# Patient Record
Sex: Female | Born: 1940 | Race: White | Hispanic: No | Smoking: Never smoker
Health system: Southern US, Community
[De-identification: ages and names within clinical notes are randomized; demographics above are authoritative.]

## PROBLEM LIST (undated history)

## (undated) DIAGNOSIS — F419 Anxiety disorder, unspecified: Secondary | ICD-10-CM

## (undated) DIAGNOSIS — I2510 Atherosclerotic heart disease of native coronary artery without angina pectoris: Secondary | ICD-10-CM

## (undated) DIAGNOSIS — M1990 Unspecified osteoarthritis, unspecified site: Secondary | ICD-10-CM

## (undated) DIAGNOSIS — N3010 Interstitial cystitis (chronic) without hematuria: Secondary | ICD-10-CM

## (undated) DIAGNOSIS — Z87440 Personal history of urinary (tract) infections: Secondary | ICD-10-CM

## (undated) DIAGNOSIS — F32A Depression, unspecified: Secondary | ICD-10-CM

## (undated) DIAGNOSIS — E785 Hyperlipidemia, unspecified: Secondary | ICD-10-CM

## (undated) DIAGNOSIS — H409 Unspecified glaucoma: Secondary | ICD-10-CM

## (undated) DIAGNOSIS — F329 Major depressive disorder, single episode, unspecified: Secondary | ICD-10-CM

## (undated) DIAGNOSIS — K8020 Calculus of gallbladder without cholecystitis without obstruction: Secondary | ICD-10-CM

## (undated) DIAGNOSIS — R3915 Urgency of urination: Secondary | ICD-10-CM

## (undated) DIAGNOSIS — I10 Essential (primary) hypertension: Secondary | ICD-10-CM

## (undated) DIAGNOSIS — G629 Polyneuropathy, unspecified: Secondary | ICD-10-CM

## (undated) DIAGNOSIS — J189 Pneumonia, unspecified organism: Secondary | ICD-10-CM

## (undated) DIAGNOSIS — K5790 Diverticulosis of intestine, part unspecified, without perforation or abscess without bleeding: Secondary | ICD-10-CM

## (undated) DIAGNOSIS — K219 Gastro-esophageal reflux disease without esophagitis: Secondary | ICD-10-CM

## (undated) HISTORY — DX: Interstitial cystitis (chronic) without hematuria: N30.10

## (undated) HISTORY — DX: Gastro-esophageal reflux disease without esophagitis: K21.9

## (undated) HISTORY — DX: Atherosclerotic heart disease of native coronary artery without angina pectoris: I25.10

## (undated) HISTORY — DX: Anxiety disorder, unspecified: F41.9

## (undated) HISTORY — DX: Hyperlipidemia, unspecified: E78.5

## (undated) HISTORY — DX: Essential (primary) hypertension: I10

## (undated) HISTORY — DX: Depression, unspecified: F32.A

## (undated) HISTORY — PX: BACK SURGERY: SHX140

## (undated) HISTORY — DX: Calculus of gallbladder without cholecystitis without obstruction: K80.20

## (undated) HISTORY — PX: EYE SURGERY: SHX253

## (undated) HISTORY — DX: Unspecified glaucoma: H40.9

## (undated) HISTORY — PX: VAGINAL HYSTERECTOMY: SUR661

## (undated) HISTORY — PX: COLONOSCOPY: SHX174

## (undated) HISTORY — DX: Personal history of urinary (tract) infections: Z87.440

## (undated) HISTORY — DX: Major depressive disorder, single episode, unspecified: F32.9

---

## 1991-11-07 HISTORY — PX: CHOLECYSTECTOMY: SHX55

## 2000-03-13 ENCOUNTER — Encounter: Payer: Self-pay | Admitting: Obstetrics and Gynecology

## 2000-03-13 ENCOUNTER — Encounter: Admission: RE | Admit: 2000-03-13 | Discharge: 2000-03-13 | Payer: Self-pay | Admitting: Obstetrics and Gynecology

## 2000-03-20 ENCOUNTER — Encounter: Payer: Self-pay | Admitting: Obstetrics and Gynecology

## 2000-03-20 ENCOUNTER — Encounter: Admission: RE | Admit: 2000-03-20 | Discharge: 2000-03-20 | Payer: Self-pay | Admitting: Obstetrics and Gynecology

## 2001-07-17 ENCOUNTER — Encounter: Admission: RE | Admit: 2001-07-17 | Discharge: 2001-07-17 | Payer: Self-pay | Admitting: Obstetrics and Gynecology

## 2001-07-17 ENCOUNTER — Encounter: Payer: Self-pay | Admitting: Obstetrics and Gynecology

## 2001-07-23 ENCOUNTER — Other Ambulatory Visit: Admission: RE | Admit: 2001-07-23 | Discharge: 2001-07-23 | Payer: Self-pay | Admitting: Obstetrics and Gynecology

## 2001-07-24 ENCOUNTER — Encounter: Payer: Self-pay | Admitting: Obstetrics and Gynecology

## 2001-07-24 ENCOUNTER — Encounter: Admission: RE | Admit: 2001-07-24 | Discharge: 2001-07-24 | Payer: Self-pay | Admitting: Obstetrics and Gynecology

## 2001-11-06 ENCOUNTER — Encounter (INDEPENDENT_AMBULATORY_CARE_PROVIDER_SITE_OTHER): Payer: Self-pay | Admitting: *Deleted

## 2001-11-06 LAB — CONVERTED CEMR LAB

## 2002-08-21 ENCOUNTER — Encounter: Payer: Self-pay | Admitting: Obstetrics and Gynecology

## 2002-08-21 ENCOUNTER — Encounter: Admission: RE | Admit: 2002-08-21 | Discharge: 2002-08-21 | Payer: Self-pay | Admitting: Obstetrics and Gynecology

## 2002-10-17 ENCOUNTER — Encounter: Admission: RE | Admit: 2002-10-17 | Discharge: 2002-10-17 | Payer: Self-pay | Admitting: Sports Medicine

## 2002-11-21 ENCOUNTER — Encounter: Admission: RE | Admit: 2002-11-21 | Discharge: 2002-11-21 | Payer: Self-pay | Admitting: Family Medicine

## 2003-08-25 ENCOUNTER — Encounter: Admission: RE | Admit: 2003-08-25 | Discharge: 2003-08-25 | Payer: Self-pay | Admitting: Obstetrics and Gynecology

## 2003-08-25 ENCOUNTER — Encounter: Payer: Self-pay | Admitting: Obstetrics and Gynecology

## 2003-09-03 ENCOUNTER — Encounter: Admission: RE | Admit: 2003-09-03 | Discharge: 2003-09-03 | Payer: Self-pay | Admitting: Obstetrics and Gynecology

## 2004-09-05 ENCOUNTER — Other Ambulatory Visit: Admission: RE | Admit: 2004-09-05 | Discharge: 2004-09-05 | Payer: Self-pay | Admitting: Obstetrics and Gynecology

## 2004-09-09 ENCOUNTER — Encounter: Admission: RE | Admit: 2004-09-09 | Discharge: 2004-09-09 | Payer: Self-pay | Admitting: Obstetrics and Gynecology

## 2004-09-28 ENCOUNTER — Other Ambulatory Visit: Admission: RE | Admit: 2004-09-28 | Discharge: 2004-09-28 | Payer: Self-pay | Admitting: Obstetrics and Gynecology

## 2005-10-16 ENCOUNTER — Encounter: Admission: RE | Admit: 2005-10-16 | Discharge: 2005-10-16 | Payer: Self-pay | Admitting: Sports Medicine

## 2005-10-16 ENCOUNTER — Ambulatory Visit: Payer: Self-pay | Admitting: Family Medicine

## 2005-10-16 ENCOUNTER — Encounter: Admission: RE | Admit: 2005-10-16 | Discharge: 2005-10-16 | Payer: Self-pay | Admitting: Obstetrics and Gynecology

## 2005-11-09 ENCOUNTER — Other Ambulatory Visit: Admission: RE | Admit: 2005-11-09 | Discharge: 2005-11-09 | Payer: Self-pay | Admitting: Obstetrics and Gynecology

## 2006-10-18 ENCOUNTER — Encounter: Admission: RE | Admit: 2006-10-18 | Discharge: 2006-10-18 | Payer: Self-pay | Admitting: Obstetrics and Gynecology

## 2006-11-12 ENCOUNTER — Other Ambulatory Visit: Admission: RE | Admit: 2006-11-12 | Discharge: 2006-11-12 | Payer: Self-pay | Admitting: Obstetrics and Gynecology

## 2007-01-03 ENCOUNTER — Ambulatory Visit: Payer: Self-pay | Admitting: Sports Medicine

## 2007-01-04 ENCOUNTER — Encounter (INDEPENDENT_AMBULATORY_CARE_PROVIDER_SITE_OTHER): Payer: Self-pay | Admitting: *Deleted

## 2007-02-04 ENCOUNTER — Telehealth: Payer: Self-pay | Admitting: *Deleted

## 2007-02-06 ENCOUNTER — Ambulatory Visit: Payer: Self-pay | Admitting: Sports Medicine

## 2007-02-06 DIAGNOSIS — M719 Bursopathy, unspecified: Secondary | ICD-10-CM

## 2007-02-06 DIAGNOSIS — M67919 Unspecified disorder of synovium and tendon, unspecified shoulder: Secondary | ICD-10-CM | POA: Insufficient documentation

## 2007-02-06 DIAGNOSIS — M7580 Other shoulder lesions, unspecified shoulder: Secondary | ICD-10-CM

## 2007-02-06 DIAGNOSIS — M25819 Other specified joint disorders, unspecified shoulder: Secondary | ICD-10-CM | POA: Insufficient documentation

## 2007-02-12 ENCOUNTER — Encounter: Payer: Self-pay | Admitting: Sports Medicine

## 2007-03-06 ENCOUNTER — Ambulatory Visit: Payer: Self-pay | Admitting: Sports Medicine

## 2007-03-06 DIAGNOSIS — M25529 Pain in unspecified elbow: Secondary | ICD-10-CM | POA: Insufficient documentation

## 2007-07-22 ENCOUNTER — Telehealth: Payer: Self-pay | Admitting: *Deleted

## 2007-08-01 ENCOUNTER — Ambulatory Visit: Payer: Self-pay | Admitting: Sports Medicine

## 2007-08-01 DIAGNOSIS — F418 Other specified anxiety disorders: Secondary | ICD-10-CM | POA: Insufficient documentation

## 2007-08-01 DIAGNOSIS — R4589 Other symptoms and signs involving emotional state: Secondary | ICD-10-CM | POA: Insufficient documentation

## 2007-08-19 ENCOUNTER — Telehealth: Payer: Self-pay | Admitting: Sports Medicine

## 2007-09-27 ENCOUNTER — Encounter: Payer: Self-pay | Admitting: Sports Medicine

## 2007-11-08 ENCOUNTER — Encounter: Admission: RE | Admit: 2007-11-08 | Discharge: 2007-11-08 | Payer: Self-pay | Admitting: Obstetrics and Gynecology

## 2007-11-14 ENCOUNTER — Other Ambulatory Visit: Admission: RE | Admit: 2007-11-14 | Discharge: 2007-11-14 | Payer: Self-pay | Admitting: Obstetrics and Gynecology

## 2007-12-20 ENCOUNTER — Ambulatory Visit: Payer: Self-pay | Admitting: Sports Medicine

## 2007-12-24 ENCOUNTER — Encounter: Admission: RE | Admit: 2007-12-24 | Discharge: 2007-12-24 | Payer: Self-pay | Admitting: Sports Medicine

## 2008-02-27 ENCOUNTER — Ambulatory Visit: Payer: Self-pay

## 2008-02-27 ENCOUNTER — Encounter: Payer: Self-pay | Admitting: Sports Medicine

## 2008-02-27 DIAGNOSIS — I10 Essential (primary) hypertension: Secondary | ICD-10-CM | POA: Insufficient documentation

## 2008-02-27 DIAGNOSIS — G43909 Migraine, unspecified, not intractable, without status migrainosus: Secondary | ICD-10-CM | POA: Insufficient documentation

## 2008-03-02 ENCOUNTER — Encounter: Payer: Self-pay | Admitting: Family Medicine

## 2008-03-03 ENCOUNTER — Telehealth: Payer: Self-pay | Admitting: *Deleted

## 2008-03-09 LAB — CONVERTED CEMR LAB
ALT: 23 units/L (ref 0–35)
AST: 20 units/L (ref 0–37)
Albumin: 4.6 g/dL (ref 3.5–5.2)
Alkaline Phosphatase: 61 units/L (ref 39–117)
BUN: 20 mg/dL (ref 6–23)
CO2: 21 meq/L (ref 19–32)
Calcium: 9.7 mg/dL (ref 8.4–10.5)
Chloride: 105 meq/L (ref 96–112)
Cholesterol: 238 mg/dL — ABNORMAL HIGH (ref 0–200)
Creatinine, Ser: 0.79 mg/dL (ref 0.40–1.20)
Glucose, Bld: 117 mg/dL — ABNORMAL HIGH (ref 70–99)
HDL: 61 mg/dL (ref >39)
LDL Cholesterol: 130 mg/dL — ABNORMAL HIGH (ref 0–99)
Potassium: 4.5 meq/L (ref 3.5–5.3)
Sodium: 139 meq/L (ref 135–145)
Total Bilirubin: 0.3 mg/dL (ref 0.3–1.2)
Total CHOL/HDL Ratio: 3.9
Total Protein: 7.1 g/dL (ref 6.0–8.3)
Triglycerides: 234 mg/dL — ABNORMAL HIGH (ref <150)
VLDL: 47 mg/dL — ABNORMAL HIGH (ref 0–40)

## 2008-05-04 ENCOUNTER — Encounter (INDEPENDENT_AMBULATORY_CARE_PROVIDER_SITE_OTHER): Payer: Self-pay | Admitting: *Deleted

## 2008-06-01 ENCOUNTER — Telehealth: Payer: Self-pay | Admitting: *Deleted

## 2008-06-02 ENCOUNTER — Ambulatory Visit: Payer: Self-pay | Admitting: Sports Medicine

## 2008-06-02 LAB — CONVERTED CEMR LAB: Direct LDL: 118 mg/dL — ABNORMAL HIGH

## 2008-06-08 ENCOUNTER — Telehealth: Payer: Self-pay | Admitting: *Deleted

## 2008-11-09 ENCOUNTER — Encounter: Admission: RE | Admit: 2008-11-09 | Discharge: 2008-11-09 | Payer: Self-pay | Admitting: Obstetrics and Gynecology

## 2008-12-04 ENCOUNTER — Other Ambulatory Visit: Admission: RE | Admit: 2008-12-04 | Discharge: 2008-12-04 | Payer: Self-pay | Admitting: Obstetrics and Gynecology

## 2009-03-10 ENCOUNTER — Encounter: Payer: Self-pay | Admitting: Family Medicine

## 2009-03-10 ENCOUNTER — Ambulatory Visit: Payer: Self-pay | Admitting: Family Medicine

## 2009-03-10 LAB — CONVERTED CEMR LAB
BUN: 18 mg/dL (ref 6–23)
CO2: 25 meq/L (ref 19–32)
Calcium: 9.6 mg/dL (ref 8.4–10.5)
Chloride: 102 meq/L (ref 96–112)
Cholesterol: 185 mg/dL (ref 0–200)
Creatinine, Ser: 0.84 mg/dL (ref 0.40–1.20)
Glucose, Bld: 105 mg/dL — ABNORMAL HIGH (ref 70–99)
HDL: 53 mg/dL (ref >39)
LDL Cholesterol: 114 mg/dL — ABNORMAL HIGH (ref 0–99)
Potassium: 4 meq/L (ref 3.5–5.3)
Sodium: 139 meq/L (ref 135–145)
Total CHOL/HDL Ratio: 3.5
Triglycerides: 90 mg/dL (ref <150)
VLDL: 18 mg/dL (ref 0–40)

## 2009-03-25 ENCOUNTER — Ambulatory Visit: Payer: Self-pay | Admitting: Family Medicine

## 2009-03-25 DIAGNOSIS — M25569 Pain in unspecified knee: Secondary | ICD-10-CM | POA: Insufficient documentation

## 2009-03-25 DIAGNOSIS — M19049 Primary osteoarthritis, unspecified hand: Secondary | ICD-10-CM | POA: Insufficient documentation

## 2009-03-29 ENCOUNTER — Ambulatory Visit: Payer: Self-pay | Admitting: Family Medicine

## 2009-09-15 ENCOUNTER — Encounter: Payer: Self-pay | Admitting: Family Medicine

## 2009-09-20 ENCOUNTER — Encounter: Payer: Self-pay | Admitting: Family Medicine

## 2009-11-10 ENCOUNTER — Encounter: Admission: RE | Admit: 2009-11-10 | Discharge: 2009-11-10 | Payer: Self-pay | Admitting: Obstetrics and Gynecology

## 2009-12-06 ENCOUNTER — Other Ambulatory Visit: Admission: RE | Admit: 2009-12-06 | Discharge: 2009-12-06 | Payer: Self-pay | Admitting: Obstetrics and Gynecology

## 2009-12-06 ENCOUNTER — Encounter: Payer: Self-pay | Admitting: Family Medicine

## 2009-12-06 LAB — CONVERTED CEMR LAB: Pap Smear: NEGATIVE

## 2010-03-28 ENCOUNTER — Ambulatory Visit: Payer: Self-pay | Admitting: Family Medicine

## 2010-03-30 ENCOUNTER — Ambulatory Visit: Payer: Self-pay | Admitting: Family Medicine

## 2010-03-30 ENCOUNTER — Encounter: Payer: Self-pay | Admitting: Family Medicine

## 2010-04-01 ENCOUNTER — Encounter: Payer: Self-pay | Admitting: Family Medicine

## 2010-04-01 ENCOUNTER — Ambulatory Visit: Payer: Self-pay | Admitting: Family Medicine

## 2010-04-01 ENCOUNTER — Telehealth: Payer: Self-pay | Admitting: *Deleted

## 2010-04-05 ENCOUNTER — Encounter: Payer: Self-pay | Admitting: Family Medicine

## 2010-04-05 LAB — CONVERTED CEMR LAB
ALT: 30 units/L (ref 0–35)
AST: 29 units/L (ref 0–37)
Albumin: 4.2 g/dL (ref 3.5–5.2)
Alkaline Phosphatase: 62 units/L (ref 39–117)
BUN: 19 mg/dL (ref 6–23)
Bilirubin, Direct: 0.1 mg/dL (ref 0.0–0.3)
CO2: 26 meq/L (ref 19–32)
Calcium: 9.2 mg/dL (ref 8.4–10.5)
Chloride: 99 meq/L (ref 96–112)
Cholesterol: 171 mg/dL (ref 0–200)
Creatinine, Ser: 0.78 mg/dL (ref 0.40–1.20)
Glucose, Bld: 107 mg/dL — ABNORMAL HIGH (ref 70–99)
HDL: 49 mg/dL (ref >39)
Indirect Bilirubin: 0.4 mg/dL (ref 0.0–0.9)
LDL Cholesterol: 96 mg/dL (ref 0–99)
Potassium: 4 meq/L (ref 3.5–5.3)
Sodium: 137 meq/L (ref 135–145)
Total Bilirubin: 0.5 mg/dL (ref 0.3–1.2)
Total CHOL/HDL Ratio: 3.5
Total Protein: 6.5 g/dL (ref 6.0–8.3)
Triglycerides: 131 mg/dL (ref <150)
VLDL: 26 mg/dL (ref 0–40)

## 2010-04-06 ENCOUNTER — Telehealth: Payer: Self-pay | Admitting: Family Medicine

## 2010-04-14 ENCOUNTER — Encounter: Payer: Self-pay | Admitting: Family Medicine

## 2010-05-04 ENCOUNTER — Encounter: Payer: Self-pay | Admitting: Family Medicine

## 2010-07-01 ENCOUNTER — Encounter: Payer: Self-pay | Admitting: Family Medicine

## 2010-08-08 ENCOUNTER — Encounter: Payer: Self-pay | Admitting: *Deleted

## 2010-11-14 ENCOUNTER — Encounter
Admission: RE | Admit: 2010-11-14 | Discharge: 2010-11-14 | Payer: Self-pay | Source: Home / Self Care | Attending: Family Medicine | Admitting: Family Medicine

## 2010-11-16 ENCOUNTER — Encounter: Payer: Self-pay | Admitting: Family Medicine

## 2010-11-23 ENCOUNTER — Ambulatory Visit
Admission: RE | Admit: 2010-11-23 | Discharge: 2010-11-23 | Payer: Self-pay | Source: Home / Self Care | Attending: Family Medicine | Admitting: Family Medicine

## 2010-11-23 LAB — CONVERTED CEMR LAB
Blood in Urine, dipstick: NEGATIVE
Nitrite: NEGATIVE
Protein, U semiquant: NEGATIVE
Specific Gravity, Urine: 1.02
Urobilinogen, UA: 0.2
pH: 5.5

## 2010-11-24 ENCOUNTER — Encounter: Payer: Self-pay | Admitting: Family Medicine

## 2010-11-26 ENCOUNTER — Encounter: Payer: Self-pay | Admitting: Obstetrics and Gynecology

## 2010-11-27 ENCOUNTER — Encounter: Payer: Self-pay | Admitting: Obstetrics and Gynecology

## 2010-12-06 NOTE — Progress Notes (Signed)
Summary: phn msg  Phone Note Call from Patient Call back at Home Phone [phone]   Caller: Patient Summary of Call: pt states that the Fiorcet has helped - took a while but is now is better Initial call taken by: De Nurse,  April 06, 2010 1:43 PM

## 2010-12-06 NOTE — Miscellaneous (Signed)
Summary: records reviewed and info entered  Clinical Lists Changes   Mammogram  Procedure date:  11/10/2009  Findings:      Assessment: BIRADS 1. Location: Breast Center [facility].   Scattered fibroglandular densities.    Pap Smear  Procedure date:  12/06/2009  Findings:      Interpretation/Result:Negative for intraepithelial Lesion or Malignancy.   Ancillary Testing: NO HR HPV present. Done at [facility].  Pt s/p hysterectomy, pt desired pap.    Bone Density  Procedure date:  01/04/2009  Findings:         Hip Total: T Score -2.5 to -1.0 Hip.   Radius -0.9  Unable to eval lumbar spine due to degenerative changes. Stable osteopenia.  Pelvic US  Procedure date:  01/04/2009  Findings:      normal: s/p hysterectomy.  Ovaries present and norma.    Mammogram  Procedure date:  11/09/2008  Findings:      Assessment: BIRADS 1. Scattered fibroglandular densities  Mammogram  Procedure date:  11/08/2007  Findings:      Assessment: BIRADS 1.  Tissue heterogeneously dense.     Observations: Added new observation of PAST SURG HX: Cholecystectomy 1993  Hysterectomy 1978 cataracts removed 9/10 (04/14/2010 8:51) Added new observation of DM PROGRESS: N/A (04/14/2010 8:51) Added new observation of DM FSREVIEW: N/A (04/14/2010 8:51) Added new observation of PAP SMEAR: Interpretation/Result:Negative for intraepithelial Lesion or Malignancy.   Ancillary Testing: NO HR HPV present. Done at [facility].  Pt s/p hysterectomy, pt desired pap.   (12/06/2009 8:55) Added new observation of MAMMOGRAM: Assessment: BIRADS 1. Location: Breast Center [facility].   Scattered fibroglandular densities.   (11/10/2009 8:53) Added new observation of PELVIS US: normal: s/p hysterectomy.  Ovaries present and norma.   (01/04/2009 9:13) Added new observation of BONE DENSITY:    Hip Total: T Score -2.5 to -1.0 Hip.   Radius -0.9  Unable to eval lumbar spine due to  degenerative changes. Stable osteopenia. (01/04/2009 9:05) Added new observation of MAMMOGRAM: Assessment: BIRADS 1. Scattered fibroglandular densities (11/09/2008 9:14) Added new observation of MAMMOGRAM: Assessment: BIRADS 1.  Tissue heterogeneously dense.    (11/08/2007 9:15)     Past History:  Past Surgical History: Cholecystectomy 1993  Hysterectomy 1978 cataracts removed 9/10    Prevention & Chronic Care Immunizations   Influenza vaccine: Not documented   Influenza vaccine deferral: Not indicated  (03/28/2010)    Tetanus booster: 11/06/1996: Done.   Tetanus booster due: 11/06/2006    Pneumococcal vaccine: given  (11/06/2002)   Pneumococcal vaccine due: None    H. zoster vaccine: Not documented  Colorectal Screening   Hemoccult: Done.  (11/06/2001)   Hemoccult due: Not Indicated    Colonoscopy: normal  (11/07/2003)   Colonoscopy due: 11/06/2013  Other Screening   Pap smear: Interpretation/Result:Negative for intraepithelial Lesion or Malignancy.   Ancillary Testing: NO HR HPV present. Done at [facility].  Pt s/p hysterectomy, pt desired pap.    (12/06/2009)   Pap smear due: Not Indicated    Mammogram: Assessment: BIRADS 1. Location: Breast Center [facility].   Scattered fibroglandular densities.    (11/10/2009)   Mammogram due: 11/06/2009    DXA bone density scan:   Hip Total: T Score -2.5 to -1.0 Hip.   Radius -0.9  Unable to eval lumbar spine due to degenerative changes. Stable osteopenia.  (01/04/2009)   Smoking status: never  (03/28/2010)  Lipids   Total Cholesterol: 171  (03/30/2010)   Lipid panel action/deferral: Lipid  Panel ordered   LDL: 96  (03/30/2010)   LDL Direct: 118  (06/02/2008)   HDL: 49  (03/30/2010)   Triglycerides: 131  (03/30/2010)    SGOT (AST): 29  (03/30/2010)   BMP action: Ordered   SGPT (ALT): 30  (03/30/2010)   Alkaline phosphatase: 62  (03/30/2010)   Total bilirubin: 0.5  (03/30/2010)  Hypertension    Last Blood Pressure: 130 / 74  (04/01/2010)   Serum creatinine: 0.78  (03/30/2010)   BMP action: Ordered   Serum potassium 4.0  (03/30/2010)  Self-Management Support :   Personal Goals (by the next clinic visit) :      Personal blood pressure goal: 140/90  (03/28/2010)     Personal LDL goal: 100  (03/28/2010)    Hypertension self-management support: Not documented    Hypertension self-management support not done because: Good outcomes  (03/28/2010)    Lipid self-management support: Not documented     Lipid self-management support not done because: Good outcomes  (03/28/2010)

## 2010-12-06 NOTE — Miscellaneous (Signed)
Summary: ROI  ROI   Imported By: De Nurse 04/06/2010 16:09:53  _____________________________________________________________________  External Attachment:    Type:   Image     Comment:   External Document

## 2010-12-06 NOTE — Consult Note (Signed)
Summary: [facility]  [facility]   Imported By: Marily Memos 10/06/2010 10:19:08  _____________________________________________________________________  External Attachment:    Type:   Image     Comment:   External Document

## 2010-12-06 NOTE — Consult Note (Signed)
Summary: [facility]   Imported By: De Nurse 05/17/2010 16:17:20  _____________________________________________________________________  External Attachment:    Type:   Image     Comment:   External Document

## 2010-12-06 NOTE — Assessment & Plan Note (Signed)
Summary: cpe,df   Vital Signs:  Patient profile:   70 year old female Height:      64 inches Weight:      152.4 pounds BMI:     26.25 Temp:     98.4 degrees F oral Pulse rate:   102 / minute Pulse rhythm:   regular BP sitting:   135 / 84  (left arm) Cuff size:   regular  Vitals Entered By: Loralee Pacas CMA (Mar 28, 2010 8:47 AM) CC: cpe Is Patient Diabetic? No   CC:  cpe.  History of Present Illness: 70 yo female here for CPE.   c/o HA that woke her up about 12 days ago.  Has taken Aleve with some relief, but not complete resolution.  + Significant stress in life - 1 daughter making some unwise choices, feels this is affecting her granddaughters.  Has tried to get CPS involved but they have felt the children are safe.  No photophonia, photophobia, nausea, vomiting.  Pain occurs R side of head at base of skull, radiates to top of head.  Relieved with massage, sleeping with fist under neck.  Denies numbness/weakness/ speech problems.    Also dx with glaucoma this year.  Treated with drops.  Also notes pointer finger R hand now deviating slightly.  Does not bother her.      Habits & Providers  Alcohol-Tobacco-Diet     Alcohol drinks/day: 2     Alcohol Counseling: not indicated; use of alcohol is not excessive or problematic     Alcohol type: wine     Feels need to cut down: no     Feels annoyed by complaints: no     Needs 'eye opener' in am: no     Tobacco Status: never  Exercise-Depression-Behavior     Does Patient Exercise: yes     Exercise Counseling: not indicated; exercise is adequate     Type of exercise: walking, yoga     STD Risk: never  Current Medications (verified): 1)  Lisinopril 20 Mg Tabs (Lisinopril) .... Take 1 Tablet By Mouth 2)  Claritin 10 Mg  Caps (Loratadine) .Marland Kitchen.. 1 By Mouth Qd 3)  Hydrochlorothiazide 12.5 Mg  Tabs (Hydrochlorothiazide) .Marland Kitchen.. 1 By Mouth Qd 4)  Lipitor 40 Mg  Tabs (Atorvastatin Calcium) .... Take One Tablet At Bed Time 5)   Amitriptyline Hcl 50 Mg Tabs (Amitriptyline Hcl) .... Take 1 By Mouth At Night As Needed For Insomnia 6)  Alprazolam 0.25 Mg Tbdp (Alprazolam) .... Take 1 Every 6 Hours As Needed For Severe Anxiety 7)  Aleve 220 Mg Tabs (Naproxen Sodium) .... 2 By Mouth As Needed Pain 8)  Travatan Z 0.004 % Soln (Travoprost) .Marland Kitchen.. 1 Drop Ou Phs  Allergies (verified): No Known Drug Allergies  Past History:  Past Medical History: Psoriasis - nizoral shampoo and dermasmooth on scalp  - she has had light treatmetns 3 years with UVL patellofemoral pain rupturued diverticulae - hosp x 5 days migraines - uses fiorinal R shoulder subdeltoid bursitis - responded very well to injection Hypertension Hyperlipidemia glaucoma dx 2010  Past Surgical History: Cholecystectomy  Hysterectomy  cataracts removed Aug 14, 2023  Family History: Father died at 72y with liver CA,  Mother died at 68y with CAD  Sister - 53y, healthy  Social History: Walks up to 4-5 miles/day up to 5 days/week.   Yoga.   Lives alone, has significant other, not sexually active  3 daughter Synetta Fail [place], [place], [place]).   Pt is retired Teacher, early years/pre.  Enjoys reading, traveling. Lots of stress from daughter.  Feels her granddaughters are endangered by her unwise choices and has tried to get CPS involved.  Husband committed suicide at age 60  Review of Systems       Vision change with cataract surgery for close vision  Physical Exam  General:  Well-developed,well-nourished,in no acute distress; alert,appropriate and cooperative throughout examination Eyes:  No corneal or conjunctival inflammation noted. EOMI. Perrla.  Vision grossly normal. Ears:  External ear exam shows no significant lesions or deformities.  Otoscopic examination reveals clear canals, tympanic membranes are intact bilaterally without bulging, retraction, inflammation or discharge. Hearing is grossly normal bilaterally. Mouth:  Oral mucosa and oropharynx without lesions or  exudates.  Teeth in good repair. Lungs:  Normal respiratory effort, chest expands symmetrically. Lungs are clear to auscultation, no crackles or wheezes. Heart:  Normal rate and regular rhythm. S1 and S2 normal without gallop, murmur, click, rub or other extra sounds. Extremities:  R pointer finger with swelling of PIP, slight ulnar deviation of that one finger. Neurologic:  CN II - XII intact except VF not assessed.  Strength 5/5, DTRs 2+ trouroughout, rapid alt movements and F-N intact.  Nl heel/toe/tandem gait.  Neg Romberg.  No pronator drift.   Psych:  Nl grooming and dress.  Tearful when describing her daughter.  non labile.  No FOI/LOA.  Nl TC/TP   Impression & Recommendations:  Problem # 1:  PREVENTIVE HEALTH CARE (ICD-V70.0)  Had gyn exam and mammo done in Jan/Feb.  Will request records.  Otherwise, UTD  Orders: [facility] - Est  65+ [phone])  Problem # 2:  HYPERTENSION (ICD-401.9) At goal.  Continue current regimen Her updated medication list for this problem includes:    Lisinopril 20 Mg Tabs (Lisinopril) .Marland Kitchen... Take 1 tablet by mouth    Hydrochlorothiazide 12.5 Mg Tabs (Hydrochlorothiazide) .Marland Kitchen... 1 by mouth qd  Orders: T-Basic Metabolic Panel [phone]) [facility] - Est  65+ [phone])  Problem # 3:  HYPERCHOLESTEROLEMIA (ICD-272.0) Check panel when fasting. Check CMP. Her updated medication list for this problem includes:    Lipitor 40 Mg Tabs (Atorvastatin calcium) .Marland Kitchen... Take one tablet at bed time  Orders: T-Lipid Profile [phone]) [facility] - Est  65+ [phone])  Problem # 4:  HEADACHE (ICD-784.0)  Likely tension.  Trial of cyclobenzaprine. Pt advised of interaction with additive effects of ETOH, amitryptiline.  F/u if no improvement. The following medications were removed from the medication list:    Fioricet 50-325-40 Mg Tabs (Butalbital-apap-caffeine) .Marland Kitchen... 2 at onset of headache and repeat in 6 hours Her updated medication list for this problem includes:    Aleve 220 Mg Tabs  (Naproxen sodium) .Marland Kitchen... 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  No intervention needed  Orders: [facility] - Est  65+ [phone])  Problem # 6:  FAMILY STRESS (ICD-V61.9)  Pt with sig problems with daughter.  She does have support from other daughters, from faith community and feels that her massage therapist also helps her with her problems.  Pt feels she is doing ok with this.  Orders: [facility] - Est  65+ [phone])  Complete Medication List: 1)  Lisinopril 20 Mg Tabs (Lisinopril) .... Take 1 tablet by mouth 2)  Claritin 10 Mg Caps (Loratadine) .Marland Kitchen.. 1 by mouth qd 3)  Hydrochlorothiazide 12.5 Mg Tabs (  Hydrochlorothiazide) .Marland Kitchen.. 1 by mouth qd 4)  Lipitor 40 Mg Tabs (Atorvastatin calcium) .... Take one tablet at bed time 5)  Amitriptyline Hcl 50 Mg Tabs (Amitriptyline hcl) .... Take 1 by mouth at night as needed for insomnia 6)  Alprazolam 0.25 Mg Tbdp (Alprazolam) .... Take 1 every 6 hours as needed for severe anxiety 7)  Aleve 220 Mg Tabs (Naproxen sodium) .... 2 by mouth as needed pain 8)  Travatan Z 0.004 % Soln (Travoprost) .Marland Kitchen.. 1 drop ou phs 9)  Cyclobenzaprine Hcl 5 Mg Tabs (Cyclobenzaprine hcl) .... Take 1-2 by mouth three times a day as needed for muscle spasm.  will make you sleepy  Other Orders: T-Hepatic Function [phone])  Patient Instructions: 1)  Please make an appt in the next few weeks with the lab. 2)  We will look for the information from Dr. Thomasena Edis. 3)  Please let us know if the medicine does not help your headaches, or if you are feeling worse. Prescriptions: CYCLOBENZAPRINE HCL 5 MG TABS (CYCLOBENZAPRINE HCL) Take 1-2 by mouth three times a day as needed for muscle spasm.  Will make you sleepy  #60 x 3   Entered and Authorized by:   Terrion Gencarelli [place] MD   Signed by:   Rachella Basden [place] MD on 03/28/2010    Method used:   Electronically to        [facility]. [phone]* (retail)       [phone]       [place], [place]  [address]       Ph: [phone]       Fax: [phone]   RxID:   [id number]    Prevention & Chronic Care Immunizations   Influenza vaccine: Not documented   Influenza vaccine deferral: Not indicated  (03/28/2010)    Tetanus booster: 11/06/1996: Done.   Tetanus booster due: 11/06/2006    Pneumococcal vaccine: given  (11/06/2002)   Pneumococcal vaccine due: None    H. zoster vaccine: Not documented  Colorectal Screening   Hemoccult: Done.  (11/06/2001)   Hemoccult due: Not Indicated    Colonoscopy: normal  (11/07/2003)   Colonoscopy due: 11/06/2013  Other Screening   Pap smear: Done.  (11/06/2001)   Pap smear due: Not Indicated    Mammogram: normal  (11/06/2008)   Mammogram due: 11/06/2009    DXA bone density scan: Not documented   Smoking status: never  (03/28/2010)  Lipids   Total Cholesterol: 185  (03/10/2009)   Lipid panel action/deferral: Lipid Panel ordered   LDL: 114  (03/10/2009)   LDL Direct: 118  (06/02/2008)   HDL: 53  (03/10/2009)   Triglycerides: 90  (03/10/2009)    SGOT (AST): 20  (02/27/2008)   BMP action: Ordered   SGPT (ALT): 23  (02/27/2008)   Alkaline phosphatase: 61  (02/27/2008)   Total bilirubin: 0.3  (02/27/2008)    Lipid flowsheet reviewed?: Yes   Progress toward LDL goal: At goal  Hypertension   Last Blood Pressure: 135 / 84  (03/28/2010)   Serum creatinine: 0.84  (03/10/2009)   BMP action: Ordered   Serum potassium 4.0  (03/10/2009)    Hypertension flowsheet reviewed?: Yes   Progress toward BP goal: At goal  Self-Management Support :   Personal Goals (by the next clinic visit) :      Personal blood pressure goal: 140/90  (03/28/2010)     Personal LDL goal: 100  (03/28/2010)    Hypertension  self-management support: Not documented    Hypertension self-management  support not done because: Good outcomes  (03/28/2010)    Lipid self-management support: Not documented     Lipid self-management support not done because: Good outcomes  (03/28/2010)

## 2010-12-06 NOTE — Letter (Signed)
Summary: Generic Letter  [facility]  [address]   [place], [place] [address]   Phone: [phone]  Fax: [phone]    04/05/2010  Melissa Gonzales [address] [place], [place]  [address]  Dear [name], I have the results from your lab work, and everything looks fine. Your glucose is 107, which is a little higher than we like, but definitely not in the diabetic range.  I would suggest that you try to limit your intake of sugar and starchy foods, and we can continue to follow this.  Everything else looked fine - kidney function, liver function and cholesterol.  Please let us know if there is anything we can do for you.     Sincerely,   Daysi Boggan [place] MD  Appended Document: Generic Letter mailed.  Appended Document: Generic Letter mailed.

## 2010-12-06 NOTE — Progress Notes (Signed)
Summary: triage  Phone Note Call from Patient Call back at Home Phone [phone]   Caller: Patient Summary of Call: Seen Monday for pain in head on right side and it is no better, but no worse. Should she be seen again. Initial call taken by: Clydell Hakim,  Apr 01, 2010 9:25 AM  Follow-up for Phone Call        states the aleve helps but the HA comes back as it wears off. she is very concerned. wants to be seen. appt with Dr. Leveda Anna as pcp is not here today Follow-up by: Golden Circle RN,  Apr 01, 2010 9:26 AM

## 2010-12-06 NOTE — Assessment & Plan Note (Signed)
Summary: ha continues/[place]   Vital Signs:  Patient profile:   70 year old female Weight:      153 pounds Temp:     98.3 degrees F Pulse rate:   98 / minute BP sitting:   130 / 74  CC:  headache.  History of Present Illness: C/O continued daily headache.  See note of 5/23.  No visual changes.  Very mild photophobia and phonophbia.  Denies nausea or other GI symptoms No real benefit from flexeril.  Has taken fioricet in the past with severe muscle contraction HA.   High stress home with daughter a focus of stress. No syncope, confusion or focal neuro symptoms  Headache does go away at times (mornings) and typically returns each afternoon.  Has not been taking any alprazolam.  Current Medications (verified): 1)  Lisinopril 20 Mg Tabs (Lisinopril) .... Take 1 Tablet By Mouth 2)  Claritin 10 Mg  Caps (Loratadine) .Marland Kitchen.. 1 By Mouth Qd 3)  Hydrochlorothiazide 12.5 Mg  Tabs (Hydrochlorothiazide) .Marland Kitchen.. 1 By Mouth Qd 4)  Lipitor 40 Mg  Tabs (Atorvastatin Calcium) .... Take One Tablet At Bed Time 5)  Amitriptyline Hcl 50 Mg Tabs (Amitriptyline Hcl) .... Take 1 By Mouth At Night As Needed For Insomnia 6)  Alprazolam 0.25 Mg Tbdp (Alprazolam) .... Take 1 Every 6 Hours As Needed For Severe Anxiety 7)  Aleve 220 Mg Tabs (Naproxen Sodium) .... 2 By Mouth As Needed Pain 8)  Travatan Z 0.004 % Soln (Travoprost) .Marland Kitchen.. 1 Drop Ou Phs 9)  Cyclobenzaprine Hcl 5 Mg Tabs (Cyclobenzaprine Hcl) .... Take 1-2 By Mouth Three Times A Day As Needed For Muscle Spasm.  Will Make You Sleepy 10)  Fioricet 50-325-40 Mg Tabs (Butalbital-Apap-Caffeine) .... One or Two By Mouth Q4h As Needed Headache.  Max 8 Tabs Per Day  Allergies (verified): No Known Drug Allergies  Past History:  Past medical, surgical, family and social histories (including risk factors) reviewed, and no changes noted (except as noted below).  Past Medical History: Reviewed history from 03/28/2010 and no changes required. Psoriasis - nizoral  shampoo and dermasmooth on scalp  - she has had light treatmetns 3 years with UVL patellofemoral pain rupturued diverticulae - hosp x 5 days migraines - uses fiorinal R shoulder subdeltoid bursitis - responded very well to injection Hypertension Hyperlipidemia glaucoma dx 2010  Past Surgical History: Reviewed history from 03/28/2010 and no changes required. Cholecystectomy  Hysterectomy  cataracts removed 9/10  Family History: Reviewed history from 03/28/2010 and no changes required. Father died at 32y with liver CA,  Mother died at 78y with CAD  Sister - 79y, healthy  Social History: Reviewed history from 03/28/2010 and no changes required. Walks up to 4-5 miles/day up to 5 days/week.   Yoga.   Lives alone, has significant other, not sexually active  3 daughter Synetta Fail [place], [place], [place]).   Pt is retired Teacher, early years/pre.  Enjoys reading, traveling. Lots of stress from daughter.  Feels her granddaughters are endangered by her unwise choices and has tried to get CPS involved.  Husband committed suicide at age 33  Physical Exam  General:  Well-developed,well-nourished,in no acute distress; alert,appropriate and cooperative throughout examination Head:  tender over occipital ridge.  Mild tenderness over neck extensors.  No massiter or temporalis tenderness Eyes:  No corneal or conjunctival inflammation noted. EOMI. Perrla. Funduscopic exam benign, without hemorrhages, exudates or papilledema. Vision grossly normal. Neurologic:  No cranial nerve deficits noted. Station and gait are normal. Plantar reflexes are down-going  bilaterally. DTRs are symmetrical throughout. Sensory, motor and coordinative functions appear intact. Psych:  On the verge of tears when speaking about her daughter   Impression & Recommendations:  Problem # 1:  HEADACHE (ICD-784.0)  Muscle contraction in bad cycle exacerbated by stress.  Given fioricet and pain med strategies.  Also, encouraged use of  alprazolam which is also a muscle relaxant.  Consider Physical therapy if these interventions do not work. Her updated medication list for this problem includes:    Aleve 220 Mg Tabs (Naproxen sodium) .Marland Kitchen... 2 by mouth as needed pain    Fioricet 50-325-40 Mg Tabs (Butalbital-apap-caffeine) ..... One or two by mouth q4h as needed headache.  max 8 tabs per day  Orders: [facility]- Est Level  3 ([id number])  Complete Medication List: 1)  Lisinopril 20 Mg Tabs (Lisinopril) .... Take 1 tablet by mouth 2)  Claritin 10 Mg Caps (Loratadine) .Marland Kitchen.. 1 by mouth qd 3)  Hydrochlorothiazide 12.5 Mg Tabs (Hydrochlorothiazide) .Marland Kitchen.. 1 by mouth qd 4)  Lipitor 40 Mg Tabs (Atorvastatin calcium) .... Take one tablet at bed time 5)  Amitriptyline Hcl 50 Mg Tabs (Amitriptyline hcl) .... Take 1 by mouth at night as needed for insomnia 6)  Alprazolam 0.25 Mg Tbdp (Alprazolam) .... Take 1 every 6 hours as needed for severe anxiety 7)  Aleve 220 Mg Tabs (Naproxen sodium) .... 2 by mouth as needed pain 8)  Travatan Z 0.004 % Soln (Travoprost) .Marland Kitchen.. 1 drop ou phs 9)  Cyclobenzaprine Hcl 5 Mg Tabs (Cyclobenzaprine hcl) .... Take 1-2 by mouth three times a day as needed for muscle spasm.  will make you sleepy 10)  Fioricet 50-325-40 Mg Tabs (Butalbital-apap-caffeine) .... One or two by mouth q4h as needed headache.  max 8 tabs per day Prescriptions: FIORICET 50-325-40 MG TABS (BUTALBITAL-APAP-CAFFEINE) one or two by mouth q4h as needed headache.  Max 8 tabs per day  #60 x 2   Entered and Authorized by:   Doralee Albino MD   Signed by:   Doralee Albino MD on 04/01/2010   Method used:   Handwritten   RxID:   [id number]

## 2010-12-06 NOTE — Miscellaneous (Signed)
Summary: received flu vaccine  Clinical Lists Changes  received notification from  [facility] [address] that patient received flu vaccine on 07/30/2010. Theresia Lo RN  August 08, 2010 10:49 AM  Observations: Added new observation of FLU VAX: Historical (07/30/2010 10:50)      Influenza Immunization History:    Influenza # 1:  Historical (07/30/2010)

## 2010-12-08 NOTE — Miscellaneous (Signed)
Summary: negative mammogram report entered  Clinical Lists Changes  Observations: Added new observation of DM PROGRESS: N/A (11/16/2010 8:49) Added new observation of DM FSREVIEW: N/A (11/16/2010 8:49) Added new observation of MAMMOGRAM: No specific mammographic evidence of malignancy.  Assessment: BIRADS 1.  (11/14/2010 8:50)      Prevention & Chronic Care Immunizations   Influenza vaccine: Historical  (07/30/2010)   Influenza vaccine deferral: Not indicated  (03/28/2010)    Tetanus booster: 11/06/1996: Done.   Tetanus booster due: 11/06/2006    Pneumococcal vaccine: given  (11/06/2002)   Pneumococcal vaccine due: None    H. zoster vaccine: Not documented  Colorectal Screening   Hemoccult: Done.  (11/06/2001)   Hemoccult due: Not Indicated    Colonoscopy: normal  (11/07/2003)   Colonoscopy due: 11/06/2013  Other Screening   Pap smear: Interpretation/Result:Negative for intraepithelial Lesion or Malignancy.   Ancillary Testing: NO HR HPV present. Done at [facility].  Pt s/p hysterectomy, pt desired pap.    (12/06/2009)   Pap smear due: Not Indicated    Mammogram: ASSESSMENT: Negative - BI-RADS 1^MM DIGITAL SCREENING  (11/14/2010)   Mammogram due: 11/06/2009    DXA bone density scan:   Hip Total: T Score -2.5 to -1.0 Hip.   Radius -0.9  Unable to eval lumbar spine due to degenerative changes. Stable osteopenia.  (01/04/2009)   Smoking status: never  (03/28/2010)  Lipids   Total Cholesterol: 171  (03/30/2010)   Lipid panel action/deferral: Lipid Panel ordered   LDL: 96  (03/30/2010)   LDL Direct: 118  (06/02/2008)   HDL: 49  (03/30/2010)   Triglycerides: 131  (03/30/2010)    SGOT (AST): 29  (03/30/2010)   BMP action: Ordered   SGPT (ALT): 30  (03/30/2010)   Alkaline phosphatase: 62  (03/30/2010)   Total bilirubin: 0.5  (03/30/2010)  Hypertension   Last Blood Pressure: 130 / 74  (04/01/2010)   Serum creatinine: 0.78  (03/30/2010)   BMP action:  Ordered   Serum potassium 4.0  (03/30/2010)  Self-Management Support :   Personal Goals (by the next clinic visit) :      Personal blood pressure goal: 140/90  (03/28/2010)     Personal LDL goal: 100  (03/28/2010)    Hypertension self-management support: Not documented    Hypertension self-management support not done because: Good outcomes  (03/28/2010)    Lipid self-management support: Not documented     Lipid self-management support not done because: Good outcomes  (03/28/2010)    Mammogram  Procedure date:  11/14/2010  Findings:      No specific mammographic evidence of malignancy.  Assessment: BIRADS 1.

## 2010-12-08 NOTE — Assessment & Plan Note (Signed)
Summary: INTERMITTENT BLADDER SPASMS/BM C   Vital Signs:  Patient profile:   70 year old female Height:      64 inches Weight:      153 pounds Temp:     98.2 degrees F oral Pulse rate:   81 / minute Pulse rhythm:   regular BP sitting:   157 / 89  (left arm) Cuff size:   regular  Vitals Entered By: Loralee Pacas CMA (November 23, 2010 10:15 AM) CC: bladder spasms x 6 mos Is Patient Diabetic? No Pain Assessment Patient in pain? no      Comments pt is on  premarin cream 2x a week, nexium, dorzolamide-timolol eye drops   CC:  bladder spasms x 6 mos.  History of Present Illness: six months of increasingproblems with a "twinge"    n her bladder. Initially started once or twice a week, now more often until last few days has had it pretty frequently--sometimes lasting as long as an hour or two. Not true pain, sort of like a feeling of a uti---not exactly buring. has noted no increase in frequency but has had 2 episodes of urinary incontinence when her bladder was really full and could not get to toilet soon enough.  No blood in urine. No abdominal pain, no fever, no bak pain, no vaginal d/c. Drinks 2 cups pf copffee daily for many years. No soft drinks.  Habits & Providers  Alcohol-Tobacco-Diet     Alcohol drinks/day: 2     Alcohol Counseling: not indicated; use of alcohol is not excessive or problematic     Alcohol type: wine     Feels need to cut down: no     Feels annoyed by complaints: no     Feels guilty re: drinking: no     Needs 'eye opener' in am: no     Tobacco Status: never     Diet Comments: fruits and vegetables     Diet Counseling: not indicated; diet is assessed to be healthy  Exercise-Depression-Behavior     Have you felt down or hopeless? no     Have you felt little pleasure in things? no     Depression Counseling: not indicated; screening negative for depression     Seat Belt Use: always  Current Medications (verified): 1)  Lisinopril 20 Mg Tabs  (Lisinopril) .... Take 1 Tablet By Mouth 2)  Claritin 10 Mg  Caps (Loratadine) .Marland Kitchen.. 1 By Mouth Qd 3)  Hydrochlorothiazide 12.5 Mg  Tabs (Hydrochlorothiazide) .Marland Kitchen.. 1 By Mouth Qd 4)  Lipitor 40 Mg  Tabs (Atorvastatin Calcium) .... Take One Tablet At Bed Time 5)  Amitriptyline Hcl 50 Mg Tabs (Amitriptyline Hcl) .... Take 1 By Mouth At Night As Needed For Insomnia 6)  Alprazolam 0.25 Mg Tbdp (Alprazolam) .... Take 1 Every 6 Hours As Needed For Severe Anxiety 7)  Aleve 220 Mg Tabs (Naproxen Sodium) .... 2 By Mouth As Needed Pain 8)  Travatan Z 0.004 % Soln (Travoprost) .Marland Kitchen.. 1 Drop Ou Phs 9)  Cyclobenzaprine Hcl 5 Mg Tabs (Cyclobenzaprine Hcl) .... Take 1-2 By Mouth Three Times A Day As Needed For Muscle Spasm.  Will Make You Sleepy 10)  Fioricet 50-325-40 Mg Tabs (Butalbital-Apap-Caffeine) .... One or Two By Mouth Q4h As Needed Headache.  Max 8 Tabs Per Day 11)  Oxybutynin Chloride 5 Mg Tabs (Oxybutynin Chloride) .Marland Kitchen.. 1 By Mouth Once Daily/ Two Times A Day For Bladder Spasm  Allergies (verified): No Known Drug Allergies  Social History: Reviewed history  from 03/28/2010 and no changes required. Walks up to 4-5 miles/day up to 5 days/week.   Yoga.   Lives alone, has significant other, not sexually active  3 daughter Synetta [place], [place], [place]).   Pt is retired Teacher, early years/pre.  Enjoys reading, traveling. Lots of stress from daughter.  Feels her granddaughters are endangered by her unwise choices and has tried to get CPS involved.  Husband committed suicide at age 20  Physical Exam  General:  alert, well-developed, well-nourished, and well-hydrated.   Abdomen:  soft, non-tender, no distention, no masses, and no guarding.   Msk:  BACK no cva tenderness   Impression & Recommendations:  Problem # 1:  DYSURIA (ICD-788.1) sounds like pretty classic ladder spasm---will get ua and cx. Did not do pelvic today--low risk as not sexually active. Will see how she does and rtc 2 -3 weeks. If not  improving w this med, will liekly do further w/u incl pelvic exam. Patient in agreement w this plan. Her updated medication list for this problem includes:    Oxybutynin Chloride 5 Mg Tabs (Oxybutynin chloride) .Marland Kitchen... 1 by mouth once daily/ two times a day for bladder spasm  Orders: Urinalysis-[facility] (00000) Urine Culture-[facility] ([id number]) [facility]- Est Level  3 (09811)  Complete Medication List: 1)  Lisinopril 20 Mg Tabs (Lisinopril) .... Take 1 tablet by mouth 2)  Claritin 10 Mg Caps (Loratadine) .Marland Kitchen.. 1 by mouth qd 3)  Hydrochlorothiazide 12.5 Mg Tabs (Hydrochlorothiazide) .Marland Kitchen.. 1 by mouth qd 4)  Lipitor 40 Mg Tabs (Atorvastatin calcium) .... Take one tablet at bed time 5)  Amitriptyline Hcl 50 Mg Tabs (Amitriptyline hcl) .... Take 1 by mouth at night as needed for insomnia 6)  Alprazolam 0.25 Mg Tbdp (Alprazolam) .... Take 1 every 6 hours as needed for severe anxiety 7)  Aleve 220 Mg Tabs (Naproxen sodium) .... 2 by mouth as needed pain 8)  Travatan Z 0.004 % Soln (Travoprost) .Marland Kitchen.. 1 drop ou phs 9)  Cyclobenzaprine Hcl 5 Mg Tabs (Cyclobenzaprine hcl) .... Take 1-2 by mouth three times a day as needed for muscle spasm.  will make you sleepy 10)  Fioricet 50-325-40 Mg Tabs (Butalbital-apap-caffeine) .... One or two by mouth q4h as needed headache.  max 8 tabs per day 11)  Oxybutynin Chloride 5 Mg Tabs (Oxybutynin chloride) .Marland Kitchen.. 1 by mouth once daily/ two times a day for bladder spasm Prescriptions: OXYBUTYNIN CHLORIDE 5 MG TABS (OXYBUTYNIN CHLORIDE) 1 by mouth once daily/ two times a day for bladder spasm  #60 x 1   Entered and Authorized by:   Denny Levy MD   Signed by:   Denny Levy MD on 11/23/2010   Method used:   Electronically to        [facility]. [phone]* (retail)       [phone]       [place], [place]  [address]       Ph: [phone]       Fax: [phone]   RxID:   [id number]    Orders Added: 1)  Urinalysis-[facility] [00000] 2)  Urine  Culture-[facility] [[id number]] 3)  [facility]- Est Level  3 [43329]    Laboratory Results   Urine Tests  Date/Time Received: November 23, 2010 10:40 AM  Date/Time Reported: November 23, 2010 11:04 AM   Routine Urinalysis   Color: yellow Appearance: Cloudy Glucose: negative   (Normal Range: Negative) Bilirubin: negative   (Normal Range: Negative) Ketone: negative   (  Normal Range: Negative) Spec. Gravity: 1.020   (Normal Range: 1.003-1.035) Blood: negative   (Normal Range: Negative) pH: 5.5   (Normal Range: 5.0-8.0) Protein: negative   (Normal Range: Negative) Urobilinogen: 0.2   (Normal Range: 0-1) Nitrite: negative   (Normal Range: Negative) Leukocyte Esterace: trace   (Normal Range: Negative)  Urine Microscopic WBC/HPF: 1-5 Bacteria/HPF: 1+ Epithelial/HPF: 1-5 Other: 3+ amorphous    Comments: urine sent for culture ...............test performed by......Marland KitchenBonnie A. [place], MLS (ASCP)cm     Appended Document: INTERMITTENT BLADDER SPASMS/BM C recheck BP manually was 137/82

## 2010-12-21 ENCOUNTER — Other Ambulatory Visit: Payer: Self-pay | Admitting: Family Medicine

## 2010-12-22 NOTE — Telephone Encounter (Signed)
Refill Request.  

## 2011-01-17 ENCOUNTER — Encounter: Payer: Self-pay | Admitting: Home Health Services

## 2011-03-31 ENCOUNTER — Other Ambulatory Visit: Payer: Self-pay | Admitting: Family Medicine

## 2011-03-31 NOTE — Telephone Encounter (Signed)
Refill request

## 2011-04-04 NOTE — Telephone Encounter (Signed)
Pt due for fasting lipid panel and liver tests.  I will put in the order.  Could you let her know I will refill her meds, and could she make a lab appt and then an appt with me about a week after lab appt?  Thanks!

## 2011-04-07 ENCOUNTER — Other Ambulatory Visit: Payer: Self-pay | Admitting: Family Medicine

## 2011-04-07 NOTE — Telephone Encounter (Signed)
Refill request

## 2011-04-11 ENCOUNTER — Encounter: Payer: Self-pay | Admitting: Family Medicine

## 2011-04-14 ENCOUNTER — Ambulatory Visit (INDEPENDENT_AMBULATORY_CARE_PROVIDER_SITE_OTHER): Payer: Medicare Other | Admitting: Family Medicine

## 2011-04-14 ENCOUNTER — Telehealth: Payer: Self-pay | Admitting: *Deleted

## 2011-04-14 ENCOUNTER — Encounter: Payer: Self-pay | Admitting: Family Medicine

## 2011-04-14 VITALS — BP 125/80 | HR 75 | Temp 97.9°F | Ht 65.0 in | Wt 152.5 lb

## 2011-04-14 DIAGNOSIS — M67919 Unspecified disorder of synovium and tendon, unspecified shoulder: Secondary | ICD-10-CM

## 2011-04-14 DIAGNOSIS — Z0000 Encounter for general adult medical examination without abnormal findings: Secondary | ICD-10-CM

## 2011-04-14 DIAGNOSIS — M719 Bursopathy, unspecified: Secondary | ICD-10-CM

## 2011-04-14 DIAGNOSIS — I10 Essential (primary) hypertension: Secondary | ICD-10-CM

## 2011-04-14 DIAGNOSIS — E785 Hyperlipidemia, unspecified: Secondary | ICD-10-CM

## 2011-04-14 DIAGNOSIS — E7800 Pure hypercholesterolemia, unspecified: Secondary | ICD-10-CM

## 2011-04-14 DIAGNOSIS — F411 Generalized anxiety disorder: Secondary | ICD-10-CM

## 2011-04-14 DIAGNOSIS — R51 Headache: Secondary | ICD-10-CM

## 2011-04-14 LAB — LIPID PANEL
Cholesterol: 178 mg/dL (ref 0–200)
HDL: 46 mg/dL (ref >39)
LDL Cholesterol: 112 mg/dL — ABNORMAL HIGH (ref 0–99)
Total CHOL/HDL Ratio: 3.9 Ratio
Triglycerides: 98 mg/dL (ref <150)
VLDL: 20 mg/dL (ref 0–40)

## 2011-04-14 LAB — COMPREHENSIVE METABOLIC PANEL
ALT: 37 U/L — ABNORMAL HIGH (ref 0–35)
AST: 35 U/L (ref 0–37)
Albumin: 4.5 g/dL (ref 3.5–5.2)
Alkaline Phosphatase: 64 U/L (ref 39–117)
BUN: 16 mg/dL (ref 6–23)
CO2: 29 mEq/L (ref 19–32)
Calcium: 9.6 mg/dL (ref 8.4–10.5)
Chloride: 100 mEq/L (ref 96–112)
Creat: 0.72 mg/dL (ref 0.50–1.10)
Glucose, Bld: 106 mg/dL — ABNORMAL HIGH (ref 70–99)
Potassium: 4.1 mEq/L (ref 3.5–5.3)
Sodium: 139 mEq/L (ref 135–145)
Total Bilirubin: 0.5 mg/dL (ref 0.3–1.2)

## 2011-04-14 MED ORDER — HYDROCHLOROTHIAZIDE 12.5 MG PO CAPS: 12.5000 mg | ORAL_CAPSULE | Freq: Every day | ORAL | Status: DC

## 2011-04-14 MED ORDER — LISINOPRIL 20 MG PO TABS: 20.0000 mg | ORAL_TABLET | Freq: Every day | ORAL | Status: DC

## 2011-04-14 MED ORDER — BUTALBITAL-APAP-CAFFEINE 50-325-40 MG PO TABS: 1.0000 | ORAL_TABLET | Freq: Four times a day (QID) | ORAL | Status: DC | PRN

## 2011-04-14 MED ORDER — ATORVASTATIN CALCIUM 40 MG PO TABS: 40.0000 mg | ORAL_TABLET | Freq: Every day | ORAL | Status: DC

## 2011-04-14 MED ORDER — AMITRIPTYLINE HCL 50 MG PO TABS: 50.0000 mg | ORAL_TABLET | Freq: Every day | ORAL | Status: DC

## 2011-04-14 MED ORDER — BUTALBITAL-APAP-CAFF-COD 50-325-40-30 MG PO CAPS: 1.0000 | ORAL_CAPSULE | Freq: Four times a day (QID) | ORAL | Status: DC | PRN

## 2011-04-14 NOTE — Patient Instructions (Signed)
It was nice to see you today. You can take the vaccine prescription to your pharmacy.  Let us know if there is any problem with it. I will look to make sure we have all of your old records. If the lump on your arm changes, please let us know.  If your shoulder pain gets worse, let us know. Please come back and see me in 6 months, or sooner if you need [place].

## 2011-04-14 NOTE — Telephone Encounter (Signed)
Received notification from [facility] that patient received Zostavax  today 04/14/2011.

## 2011-04-14 NOTE — Progress Notes (Signed)
  Subjective:    Patient ID: Melissa Gonzales, female    DOB: [birth year], 70 y.o.   MRN: [id number]  HPI Pt presents for annual physical. HCM:  Pt does monthly breast exams and gets mammograms yearly.  Her gynecologist moved, and she no longer sees gyn for anything. Amxiety: Her daughter moved in with realtively stable boyfriend, and the grandchildren live with them in [place]. Melissa Gonzales .  Melissa Gonzales hopes all is well. Shouldern Pain:  Notse L shoulder pain, especially when she lifts it, when she is carrying groceries.  Pain this time presnet for 3 months.    Has had injectioins before with relief. Also wit lump on L arm.  Noted 3 weeks ago.  NO change in size.  R hadn dominent HA: o\Occure rarely, but they do occur.  Takes Friorcet with relief.     Review of Systems Denies CP/SOB/ urinary problems/ fevers/chills    Objective:   Physical Exam  Constitutional: She appears well-developed and well-nourished. No distress.  Eyes: Conjunctivae are normal. No scleral icterus.  Neck: Normal range of motion. Neck supple. No tracheal deviation present. No thyromegaly present.  Cardiovascular: Normal rate and regular rhythm.  Exam reveals no gallop.   No murmur heard. Pulmonary/Chest: No respiratory distress. She has no wheezes. She exhibits no tenderness.       Breast exam: No masses or nodules apprecaited.  N onipple discharge. No axillary LAD  Abdominal: Soft. She exhibits no distension and no mass. There is no tenderness. There is no rebound and no guarding.  Musculoskeletal: She exhibits no edema and no tenderness.       Decreased abduction and positive empty can tests on L.    Lymphadenopathy:    She has no cervical adenopathy.  Skin: Skin is dry. She is not diaphoretic.          Assessment & Plan:  Health Maintenance: No need for pap.  Pt is interested in breast cancer screening and is up to date on mammogram.  She will go to pharmacy for shingles vaccine.   See problem list for other  issues.

## 2011-04-14 NOTE — Progress Notes (Deleted)
  Subjective:     Melissa Gonzales is a 70 y.o. female and is here for a comprehensive physical exam. The patient reports {problems:16946}.  History   Social History  . Marital Status: Widowed    Spouse Name: N/A    Number of Children: N/A  . Years of Education: N/A   Occupational History  . Not on file.   Social History Main Topics  . Smoking status: Never Smoker   . Smokeless tobacco: Not on file  . Alcohol Use: Not on file  . Drug Use: Not on file  . Sexually Active: Not on file   Other Topics Concern  . Not on file   Social History Narrative  . No narrative on file   Health Maintenance  Topic Date Due  . Colonoscopy  04/25/1990  . Zostavax  04/25/2000  . Pneumococcal Polysaccharide Vaccine Age 40 And Over  04/25/2005  . Tetanus/tdap  11/06/2006  . Influenza Vaccine  08/07/2011    {Common ambulatory SmartLinks:19316}  Review of Systems {ros; complete:30496}   Objective:    {Exam, Complete:[phone]}    Assessment:    Healthy female exam. ***     Plan:     See After Visit Summary for Counseling Recommendations

## 2011-04-15 ENCOUNTER — Encounter: Payer: Self-pay | Admitting: Family Medicine

## 2011-04-15 NOTE — Assessment & Plan Note (Signed)
Doing well with occ Fioricet.  Discussed triggers and suggested food diary.  F/u prn.

## 2011-04-15 NOTE — Assessment & Plan Note (Addendum)
At goal.  Check CMP today.  Continue with regimen.  Advised on healthy weight.

## 2011-04-15 NOTE — Assessment & Plan Note (Signed)
PT with social support, has Xanax if needed.  Triggers are from her daughter, but feels things might be getting better with her.  COntinue to follow.

## 2011-04-15 NOTE — Assessment & Plan Note (Signed)
Pt was more concerned about lump, which seems to be a prominent biceps muscle.  She declines pain meds or injections, and states if things get worse, she will return for further treatment.  If the muscle/lump changes in any way, she will return for reeval.

## 2011-04-15 NOTE — Assessment & Plan Note (Signed)
On statin.  Check LFTs and lipid panel today.

## 2011-04-17 ENCOUNTER — Encounter: Payer: Self-pay | Admitting: Family Medicine

## 2011-05-18 ENCOUNTER — Ambulatory Visit (INDEPENDENT_AMBULATORY_CARE_PROVIDER_SITE_OTHER): Payer: Medicare Other | Admitting: Sports Medicine

## 2011-05-18 DIAGNOSIS — M79609 Pain in unspecified limb: Secondary | ICD-10-CM

## 2011-05-18 DIAGNOSIS — M79602 Pain in left arm: Secondary | ICD-10-CM | POA: Insufficient documentation

## 2011-05-18 DIAGNOSIS — M719 Bursopathy, unspecified: Secondary | ICD-10-CM

## 2011-05-18 DIAGNOSIS — M75102 Unspecified rotator cuff tear or rupture of left shoulder, not specified as traumatic: Secondary | ICD-10-CM

## 2011-05-18 DIAGNOSIS — M67919 Unspecified disorder of synovium and tendon, unspecified shoulder: Secondary | ICD-10-CM

## 2011-05-18 DIAGNOSIS — S46119A Strain of muscle, fascia and tendon of long head of biceps, unspecified arm, initial encounter: Secondary | ICD-10-CM

## 2011-05-18 DIAGNOSIS — S43499A Other sprain of unspecified shoulder joint, initial encounter: Secondary | ICD-10-CM

## 2011-05-18 DIAGNOSIS — S46819A Strain of other muscles, fascia and tendons at shoulder and upper arm level, unspecified arm, initial encounter: Secondary | ICD-10-CM

## 2011-05-18 MED ORDER — TRAMADOL HCL 50 MG PO TABS: 50.0000 mg | ORAL_TABLET | Freq: Four times a day (QID) | ORAL | Status: AC | PRN

## 2011-05-18 NOTE — Progress Notes (Signed)
  Subjective:    Patient ID: Melissa Gonzales, female    DOB: 1941-01-23, 71 y.o.   MRN: [id number]  HPI  Pt presents to clinic for evaluation of lt upper arm pain x 3-4 months.  Has lump in left bicep- no injury, noticed about 6 weeks ago.  Pain is located in lt triceps.  Has pain with lifting or any ROM.  Has had similar pain bilaterally which she was evaluated by Dr. Darrick Penna for- had steroid injection which was helpful.   Her pain was not associated with an acute injury. It is persistent and sometimes wakes her at night. It limits her activities that she can do particularly overhead with her left arm  Note she does not really have neck pain or radicular symptoms. She has been seen by me for rotator cuff problems in the past.  Medical problems include hypertension and she takes lisinopril and hydrochlorothiazide. Other medications are listed. She does use Advil and Aleve and is aware that this may affect her blood pressure control.  Review of Systems     Objective:   Physical Exam Pleasant and in no acute distress    Speeds and yergason's positive on lt, neg on rt Empty can positive on lt ER positive on lt, IR mildly painful at 90 degrees abduction Pain with abduction at 45 degrees, painful arc  No drop arm sign  There is a defect in the mid to lateral portion of the upper biceps of the left arm There is an area that feels like a lump that may be a retraction of the biceps muscle  Good flexion and extension of neck Lateral rotation 30 deg to rt and 45 to lt Lateral bend more limited to left than rt by 10 degrees   Assessment & Plan:

## 2011-05-18 NOTE — Assessment & Plan Note (Signed)
This appears to be a radiating pain. Because of her hypertension I suggested we try some tramadol for pain relief if needed. If this is not helpful we can continued using Advil and Aleve and just monitor her blood pressure more carefully

## 2011-05-18 NOTE — Assessment & Plan Note (Signed)
I gave her condom exercises to try to make sure we maintained her range of motion. She should not try to do any more vigorous rehabilitation at this time. I will do a corticosteroid injection because I think she is very likely have a rotator cuff tear and I would like to scan this first. There is a theoretical risk that her bicipital tendon rupture might worsen with steroid injection.  She will return in near future for her ultrasound scan.

## 2011-05-18 NOTE — Assessment & Plan Note (Signed)
She has a positive exam and clinical findings suggestive of a partial tendon rupture. I gave her some specific exercises to help keep good biceps tone. I will scan this on her return visit as she might be a good candidate for nitroglycerin therapy.

## 2011-06-05 ENCOUNTER — Ambulatory Visit (INDEPENDENT_AMBULATORY_CARE_PROVIDER_SITE_OTHER): Payer: Medicare Other | Admitting: Sports Medicine

## 2011-06-05 VITALS — BP 149/81 | HR 75

## 2011-06-05 DIAGNOSIS — S46819A Strain of other muscles, fascia and tendons at shoulder and upper arm level, unspecified arm, initial encounter: Secondary | ICD-10-CM

## 2011-06-05 DIAGNOSIS — S43499A Other sprain of unspecified shoulder joint, initial encounter: Secondary | ICD-10-CM

## 2011-06-05 DIAGNOSIS — S46119A Strain of muscle, fascia and tendon of long head of biceps, unspecified arm, initial encounter: Secondary | ICD-10-CM

## 2011-06-05 MED ORDER — KETOPROFEN POWD: Status: DC

## 2011-06-05 NOTE — Progress Notes (Signed)
  Subjective:    Patient ID: Melissa Gonzales, female    DOB: 1941/02/18, 71 y.o.   MRN: [id number]  HPI  70 y/o female is here to follow up for right arm and shoulder pain.  The pain is primarily in the posterior shoulder and sometimes radiates down the back of the arm.  Overhead movements are worse.  She normally reaches a point of pain when she lifts her arm to shoulder height and it goes away if she continues to lift it fully.  She has been doing the exercises given her and has had a 10% improvement in her symptoms.  She takes the tramadol at night so doesn't have any night pain.  Review of Systems     Objective:   Physical Exam  Right shoulder: Shoulder: Inspection shows no obvious deformity of the shoulder or upper arm.  No popeye abnormality noted. There is a fullness palpated in the mid biceps. No tenderness over AC joint or bicipital groove. ROM is full in all planes however she does have a painful arc. Rotator cuff strength normal throughout. Negative Hawkins Speeds and Yergason's tests positive. Empty can is positive Negative clunk.   Ultrasound:  Biceps tendon is visualized in the long and short view.  Proximally the biceps tendon is seen outside of the bicipital groove.  There is fluid around the tendon.  A hypoechoic area is seen within the tendon, c/w a partial tear.  The [facility] joint has arthritic changes  There is a small amount of fluid in the bursa of the supraspinatus tendon.  The infraspinatus and subscapularis tendons appear normal.  There is no sign of impingement.     Assessment & Plan:   No problem-specific assessment & plan notes found for this encounter.

## 2011-06-05 NOTE — Assessment & Plan Note (Addendum)
She will do eccentric exercises for the biceps with a theraband.  She is not a good candidate for NTG therapy because she has a history of migraines.  Ketaprofen TID for pain.  Continue tramadol at night as needed for pain.  Start eccentric rehab exercises.

## 2011-06-05 NOTE — Patient Instructions (Signed)
1. Use ketaprofen gel 3 times a day on the biceps area.  2. Do 10 reps of biceps curls with the theraband, up fast and down slow. Do these 2-3 times during the day.  3. Do 10 reps of the arm rotations.  In fast and out slow. Do these a 2-3 times during the day.

## 2011-07-17 ENCOUNTER — Encounter: Payer: Self-pay | Admitting: Sports Medicine

## 2011-07-17 ENCOUNTER — Ambulatory Visit (INDEPENDENT_AMBULATORY_CARE_PROVIDER_SITE_OTHER): Payer: Medicare Other | Admitting: Sports Medicine

## 2011-07-17 VITALS — BP 128/70 | Ht 64.0 in | Wt 150.0 lb

## 2011-07-17 DIAGNOSIS — M75102 Unspecified rotator cuff tear or rupture of left shoulder, not specified as traumatic: Secondary | ICD-10-CM

## 2011-07-17 DIAGNOSIS — M67919 Unspecified disorder of synovium and tendon, unspecified shoulder: Secondary | ICD-10-CM

## 2011-07-17 DIAGNOSIS — S46119A Strain of muscle, fascia and tendon of long head of biceps, unspecified arm, initial encounter: Secondary | ICD-10-CM

## 2011-07-17 DIAGNOSIS — S43499A Other sprain of unspecified shoulder joint, initial encounter: Secondary | ICD-10-CM

## 2011-07-17 MED ORDER — KETOPROFEN POWD: Status: DC

## 2011-07-18 NOTE — Assessment & Plan Note (Signed)
Symptoms much improved.  Refilled topical ketoprofen.  Continue eccentric exercises.

## 2011-07-18 NOTE — Progress Notes (Signed)
  Subjective:    Patient ID: Melissa Gonzales, female    DOB: 1941/06/30, 70 y.o.   MRN: [id number]  HPI 70 y/o female is here for follow up for left shoulder pain secondary to biceps tendo rupture and impingement syndrome.  Her symptoms are improved 50%.  She gets good relief from topical ketoprofen and would like a refill.   Review of Systems     Objective:   Physical Exam  Left Shoulder:  no tenderness over AC joint or bicipital groove. ROM is full in all planes She has a painful arc from 90 to 120  Rotator cuff strength normal throughout. +empty can. Speeds and Yergason's tests normal. negative Obrien's         Assessment & Plan:

## 2011-07-18 NOTE — Assessment & Plan Note (Signed)
Some improvement noted especially on abduction.  Added ROM exercises for the next 6 weeks.  If she continues to improve consider adding strengthening at the next visit.

## 2011-08-22 ENCOUNTER — Ambulatory Visit (INDEPENDENT_AMBULATORY_CARE_PROVIDER_SITE_OTHER): Payer: Medicare Other | Admitting: Sports Medicine

## 2011-08-22 VITALS — BP 134/70

## 2011-08-22 DIAGNOSIS — S46119A Strain of muscle, fascia and tendon of long head of biceps, unspecified arm, initial encounter: Secondary | ICD-10-CM

## 2011-08-22 DIAGNOSIS — M719 Bursopathy, unspecified: Secondary | ICD-10-CM

## 2011-08-22 DIAGNOSIS — S46819A Strain of other muscles, fascia and tendons at shoulder and upper arm level, unspecified arm, initial encounter: Secondary | ICD-10-CM

## 2011-08-22 DIAGNOSIS — M67919 Unspecified disorder of synovium and tendon, unspecified shoulder: Secondary | ICD-10-CM

## 2011-08-22 DIAGNOSIS — S43499A Other sprain of unspecified shoulder joint, initial encounter: Secondary | ICD-10-CM

## 2011-08-22 DIAGNOSIS — M75102 Unspecified rotator cuff tear or rupture of left shoulder, not specified as traumatic: Secondary | ICD-10-CM

## 2011-08-22 NOTE — Progress Notes (Signed)
  Subjective:    Patient ID: Melissa Gonzales, female    DOB: 1941/09/12, 70 y.o.   MRN: [id number]  HPI  Pt presents to clinic for f/u of L shoulder pain which she reports is about 50% improved. Only has pain with abduction and elevation behind her and past her head. Has positional night time pain.  Using ketoprofen gel for pain which helps pain. Compliant with home exercises.     Review of Systems     Objective:   Physical Exam  NAD Lt shoulder exam: Slight pain with ER , not with IR- both are strong Strong speed's, has slight pain  Yergason's positive  Empty can negative Slight pain with hawkins, but it is strong ER at 90 degrees of abduction painful, but not weak  Full abduction and elevation, and flexion and elevation with no pain Back scratch normal bilaterally      Assessment & Plan:

## 2011-08-22 NOTE — Assessment & Plan Note (Signed)
This continues to improve and she has overall better strength in testing the left biceps than with the right.  We will modify her exercises to do them in a few more positions and with a few more total repetitions.  Recheck this in 2 months and may consider a repeat ultrasound at that time

## 2011-08-22 NOTE — Assessment & Plan Note (Signed)
Much improved function and testing the supraspinatous muscle and no sign of tear.  External rotation pain persists but the weakness is not a significant.  On repeat ultrasound we will look at the infraspinatus carefully.  Continue modified exercises.

## 2011-08-22 NOTE — Patient Instructions (Signed)
Please continue home exercises. Warm shoulder up before doing theraband exercises Add biceps curls in 2 different positions (as shown in picture) Return for follow up in 2 months

## 2011-09-08 ENCOUNTER — Other Ambulatory Visit: Payer: Self-pay | Admitting: Family Medicine

## 2011-09-08 NOTE — Telephone Encounter (Signed)
Refill request

## 2011-09-11 NOTE — Telephone Encounter (Signed)
Called into pt's pharmacy.Melissa Gonzales

## 2011-09-11 NOTE — Telephone Encounter (Signed)
I think this has to be phoned in.  Thanks!

## 2011-10-03 ENCOUNTER — Other Ambulatory Visit: Payer: Self-pay | Admitting: Family Medicine

## 2011-10-03 NOTE — Telephone Encounter (Signed)
Refill request

## 2011-10-17 ENCOUNTER — Other Ambulatory Visit: Payer: Self-pay | Admitting: Family Medicine

## 2011-10-17 DIAGNOSIS — Z1231 Encounter for screening mammogram for malignant neoplasm of breast: Secondary | ICD-10-CM

## 2011-10-18 ENCOUNTER — Other Ambulatory Visit: Payer: Self-pay | Admitting: Family Medicine

## 2011-10-18 NOTE — Telephone Encounter (Signed)
Refill request

## 2011-10-24 ENCOUNTER — Other Ambulatory Visit: Payer: Self-pay | Admitting: Family Medicine

## 2011-10-24 NOTE — Telephone Encounter (Signed)
Refill request

## 2011-11-09 ENCOUNTER — Encounter: Payer: Self-pay | Admitting: Family Medicine

## 2011-11-09 ENCOUNTER — Ambulatory Visit (INDEPENDENT_AMBULATORY_CARE_PROVIDER_SITE_OTHER): Payer: Medicare Other | Admitting: Family Medicine

## 2011-11-09 VITALS — BP 133/75 | HR 83 | Temp 97.4°F | Ht 65.0 in | Wt 153.2 lb

## 2011-11-09 DIAGNOSIS — E7800 Pure hypercholesterolemia, unspecified: Secondary | ICD-10-CM

## 2011-11-09 DIAGNOSIS — F411 Generalized anxiety disorder: Secondary | ICD-10-CM

## 2011-11-09 DIAGNOSIS — I10 Essential (primary) hypertension: Secondary | ICD-10-CM

## 2011-11-09 DIAGNOSIS — E876 Hypokalemia: Secondary | ICD-10-CM

## 2011-11-09 MED ORDER — ALPRAZOLAM 0.25 MG PO TABS: 0.2500 mg | ORAL_TABLET | Freq: Three times a day (TID) | ORAL | Status: DC | PRN

## 2011-11-09 NOTE — Patient Instructions (Signed)
Please come see me in 6 months, unless you need [place] before then. Please let us know if you have any problems with your medicine. I will be thinking of you and your family.

## 2011-11-10 ENCOUNTER — Encounter: Payer: Self-pay | Admitting: Family Medicine

## 2011-11-10 LAB — BASIC METABOLIC PANEL
CO2: 27 mEq/L (ref 19–32)
Glucose, Bld: 100 mg/dL — ABNORMAL HIGH (ref 70–99)
Potassium: 3.4 mEq/L — ABNORMAL LOW (ref 3.5–5.3)
Sodium: 137 mEq/L (ref 135–145)

## 2011-11-10 NOTE — Assessment & Plan Note (Signed)
Recheck in 6 months.

## 2011-11-10 NOTE — Progress Notes (Signed)
Subjective:     Patient ID: Melissa Gonzales, female   DOB: [birth year], 71 y.o.   MRN: [id number]  HPI  71 yo female here for 6 month follow up of HTN. Recently had a cold.  Otherwise not SOB, no chest pain , no edema.  Taking meds without difficulty. Sees Dr. Darrick Penna for rupture of biceps tendon.  Uses ketoprofen gel and has modified her exercises with relief.   Social situation is quite difficult.  Her grandson is facing jail time for selling drugs.  Pt and family have been working with a Clinical research associate, but grandson is not complying with terms that have been set out for him.  She has offered to help him with college or rehab, but he is unwilling to do either at this point.  She did not get a refill of her Xanax- was told by the pharmacy that it was denied. Has mammogram scheduled.  Got flu shot and shingles vaccine this year.   Review of Systems See HPI    Objective:   Physical Exam  Nursing note and vitals reviewed. Constitutional: She appears well-developed and well-nourished.       Tearful when describing grandson's situation  Cardiovascular: Normal rate, regular rhythm and normal heart sounds.  Exam reveals no gallop and no friction rub.   No murmur heard. Pulmonary/Chest: Effort normal and breath sounds normal. No respiratory distress. She has no wheezes. She has no rales.  Skin: She is not diaphoretic.  Psychiatric: She has a normal mood and affect. Her behavior is normal. Judgment and thought content normal.       As noted, tearful when describing situation, but appropriate.  Has support from priest, therapist.       Assessment:        Plan:

## 2011-11-10 NOTE — Assessment & Plan Note (Signed)
At goal.  Continue meds.  Check BMP today.

## 2011-11-10 NOTE — Progress Notes (Signed)
Addended by: [place], Auther Lyerly T on: 11/10/2011 10:37 AM   Modules accepted: Orders

## 2011-11-10 NOTE — Assessment & Plan Note (Signed)
Worsening lately with situation with grandson.  Pt has discussed idea of antidepressant with therapist, but does not currently feel she needs one because some days are good and some rough.  Does not want to take something daily.  Takes Xanax prn.  Has therapist, priest, supportive church community.

## 2011-11-22 ENCOUNTER — Ambulatory Visit: Payer: Medicare Other

## 2011-12-14 ENCOUNTER — Ambulatory Visit
Admission: RE | Admit: 2011-12-14 | Discharge: 2011-12-14 | Disposition: A | Discharge status: Home / Self Care | Payer: Medicare Other | Source: Ambulatory Visit | Attending: Family Medicine | Admitting: Family Medicine

## 2011-12-14 DIAGNOSIS — Z1231 Encounter for screening mammogram for malignant neoplasm of breast: Secondary | ICD-10-CM

## 2012-01-01 ENCOUNTER — Telehealth: Payer: Self-pay | Admitting: Family Medicine

## 2012-01-01 MED ORDER — SERTRALINE HCL 50 MG PO TABS: ORAL_TABLET | ORAL | Status: DC

## 2012-01-01 NOTE — Telephone Encounter (Signed)
Left message that I will call in rx. Briefly reviewed side effects.  Asked pt to let us know if she is getting worse.  Asked pt to come in for visit in 2 weeks.  I am ok calling this medicine in as we have previously discussed this issue.

## 2012-01-01 NOTE — Telephone Encounter (Signed)
Is needing something for her depression - spoke with her therapist and she recommended Zoloft   [facility] [address]

## 2012-01-20 ENCOUNTER — Other Ambulatory Visit: Payer: Self-pay | Admitting: Family Medicine

## 2012-01-21 NOTE — Telephone Encounter (Signed)
Refill request

## 2012-01-22 ENCOUNTER — Encounter: Payer: Self-pay | Admitting: Family Medicine

## 2012-01-22 ENCOUNTER — Ambulatory Visit (INDEPENDENT_AMBULATORY_CARE_PROVIDER_SITE_OTHER): Payer: Medicare Other | Admitting: Family Medicine

## 2012-01-22 VITALS — BP 161/82 | HR 81 | Temp 98.0°F | Ht 65.0 in | Wt 151.6 lb

## 2012-01-22 DIAGNOSIS — Z23 Encounter for immunization: Secondary | ICD-10-CM

## 2012-01-22 DIAGNOSIS — F3289 Other specified depressive episodes: Secondary | ICD-10-CM

## 2012-01-22 DIAGNOSIS — N3941 Urge incontinence: Secondary | ICD-10-CM

## 2012-01-22 DIAGNOSIS — I10 Essential (primary) hypertension: Secondary | ICD-10-CM

## 2012-01-22 DIAGNOSIS — F329 Major depressive disorder, single episode, unspecified: Secondary | ICD-10-CM

## 2012-01-22 DIAGNOSIS — M8580 Other specified disorders of bone density and structure, unspecified site: Secondary | ICD-10-CM

## 2012-01-22 DIAGNOSIS — M899 Disorder of bone, unspecified: Secondary | ICD-10-CM

## 2012-01-22 DIAGNOSIS — F32A Depression, unspecified: Secondary | ICD-10-CM

## 2012-01-22 MED ORDER — OXYBUTYNIN CHLORIDE ER 10 MG PO TB24: 10.0000 mg | ORAL_TABLET | Freq: Every day | ORAL | Status: AC

## 2012-01-22 MED ORDER — SERTRALINE HCL 50 MG PO TABS: 50.0000 mg | ORAL_TABLET | Freq: Every day | ORAL | Status: DC

## 2012-01-22 NOTE — Patient Instructions (Signed)
After you have restarted the blood pressure medicines for a few days, please recheck your blood pressure.  Let us know if it stays over 140/90. I will be thinking of your family. Let us know how you are doing on the Zoloft (sertraline) in about 2 more weeks.  We can increase the dose if needed.

## 2012-01-23 ENCOUNTER — Encounter: Payer: Self-pay | Admitting: Family Medicine

## 2012-01-23 DIAGNOSIS — F32A Depression, unspecified: Secondary | ICD-10-CM | POA: Insufficient documentation

## 2012-01-23 DIAGNOSIS — F329 Major depressive disorder, single episode, unspecified: Secondary | ICD-10-CM | POA: Insufficient documentation

## 2012-01-23 DIAGNOSIS — N3941 Urge incontinence: Secondary | ICD-10-CM | POA: Insufficient documentation

## 2012-01-23 NOTE — Assessment & Plan Note (Signed)
Not at goal, but out of meds for 3 days.  Will refill meds today and have pt monitor BP at home.  She will contact us if elevation persists.

## 2012-01-23 NOTE — Progress Notes (Signed)
  Subjective:    Patient ID: Melissa Gonzales, female    DOB: [birth year], 71 y.o.   MRN: [id number]  HPI 71 yo female here for follow up of depression.  She has started taking sertaline for depressive d/o and still sees therapist.  Melissa Gonzales is unsure if the meds help, but she reports she is not feeling worse and does not have any side effects.  She does have a sister with chronic pain and depression who is on Cymbalta with good relief.    Her grandson is still awaiting a court date, but charges were reduced to misdemeanor charges.  He is currently staying with an uncle in [place], and Melissa Gonzales feels good about that arrangement.  She continues to have a strained relationship with her daughter, and now her relationship with her 51 yo granddaughter is affected.    She also reports" bladder spasms".  She has had these in the past and was treated with oxybutin with good relief.  These seemed to resolve, but have returned.  SHe would like a prescription for this med, which she uses mostly when she will be away from home and unable to get to a bathroom quickly.  Denies problems with leaking urine, just urge incontinence. No dysuria, no frequency, no pain.  She has been out of her BP meds for 3 days.  Reports that her BPs are 120s -130s systolic when she is on her meds.      Review of Systems See HPI.     Objective:   Physical Exam  Nursing note and vitals reviewed. Constitutional: She appears well-developed and well-nourished. No distress.  Skin: She is not diaphoretic.  Psychiatric:       Nl grooming and dress.  Sad affect.  Tearful when discussing family situation.  Nonlabile.  Normal thougth content and process without FOI or LOA.            Assessment & Plan:

## 2012-01-23 NOTE — Assessment & Plan Note (Signed)
Refill of oxybutin.  Pt ed on lifestyle changes that can help as well.

## 2012-01-23 NOTE — Assessment & Plan Note (Addendum)
Stable on antidepressant.  Continue at current dose.  Continue therapy.  Family stress is certainly contributing, but pt coping with it.  Follow up 2 weeks.

## 2012-04-15 ENCOUNTER — Encounter: Payer: Self-pay | Admitting: Family Medicine

## 2012-04-15 ENCOUNTER — Ambulatory Visit (INDEPENDENT_AMBULATORY_CARE_PROVIDER_SITE_OTHER): Payer: Medicare Other | Admitting: Family Medicine

## 2012-04-15 VITALS — BP 128/83 | HR 80 | Temp 97.5°F | Ht 65.0 in | Wt 148.1 lb

## 2012-04-15 DIAGNOSIS — E7800 Pure hypercholesterolemia, unspecified: Secondary | ICD-10-CM

## 2012-04-15 DIAGNOSIS — E785 Hyperlipidemia, unspecified: Secondary | ICD-10-CM

## 2012-04-15 DIAGNOSIS — Z23 Encounter for immunization: Secondary | ICD-10-CM

## 2012-04-15 DIAGNOSIS — Z299 Encounter for prophylactic measures, unspecified: Secondary | ICD-10-CM

## 2012-04-15 DIAGNOSIS — F3289 Other specified depressive episodes: Secondary | ICD-10-CM

## 2012-04-15 DIAGNOSIS — F329 Major depressive disorder, single episode, unspecified: Secondary | ICD-10-CM

## 2012-04-15 DIAGNOSIS — I10 Essential (primary) hypertension: Secondary | ICD-10-CM

## 2012-04-15 DIAGNOSIS — F32A Depression, unspecified: Secondary | ICD-10-CM

## 2012-04-15 DIAGNOSIS — N3941 Urge incontinence: Secondary | ICD-10-CM

## 2012-04-15 MED ORDER — ATORVASTATIN CALCIUM 40 MG PO TABS: 40.0000 mg | ORAL_TABLET | Freq: Every day | ORAL | Status: DC

## 2012-04-15 MED ORDER — LISINOPRIL 20 MG PO TABS: 20.0000 mg | ORAL_TABLET | Freq: Every day | ORAL | Status: DC

## 2012-04-15 MED ORDER — BUTALBITAL-APAP-CAFFEINE 50-325-40 MG PO TABS: 1.0000 | ORAL_TABLET | Freq: Four times a day (QID) | ORAL | Status: DC | PRN

## 2012-04-15 MED ORDER — SERTRALINE HCL 50 MG PO TABS: 50.0000 mg | ORAL_TABLET | Freq: Every day | ORAL | Status: DC

## 2012-04-15 MED ORDER — ESOMEPRAZOLE MAGNESIUM 40 MG PO CPDR: 40.0000 mg | DELAYED_RELEASE_CAPSULE | Freq: Every day | ORAL | Status: DC

## 2012-04-15 MED ORDER — HYDROCHLOROTHIAZIDE 12.5 MG PO CAPS: 12.5000 mg | ORAL_CAPSULE | Freq: Every day | ORAL | Status: DC

## 2012-04-15 MED ORDER — ALPRAZOLAM 0.25 MG PO TABS: 0.2500 mg | ORAL_TABLET | Freq: Three times a day (TID) | ORAL | Status: DC | PRN

## 2012-04-15 NOTE — Assessment & Plan Note (Signed)
Due for lipids.  Pt to return fasting for lab draw.

## 2012-04-15 NOTE — Assessment & Plan Note (Signed)
Doing very well.  Coping with difficult social situation.  Continue with sertraline, therapy.

## 2012-04-15 NOTE — Progress Notes (Signed)
  Subjective:    Patient ID: Melissa Gonzales, female    DOB: 23-Aug-1941, 71 y.o.   MRN: [id number]  HPI 71 yo female here for annual exam with following issues: Depressive disorder - doing well on sertraline 50.  She still has family discord.  Does not know where her grandson is.  Her granddaughters are angry with her (all from daughter who has mental health issues).  She tries not to think about it, and overall feels she is doing well. Hypertension - taking meds without problems.  Denies SOB/Chest pain. Diarrhea - had "explosive" diarrhea.  Tried elimination diets without relief.  Tried stopping NExium without relief and with return of GERD. 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  Plans to meet with GI today to discuss treatment, but symptoms have resolved. Denies abdominal pain.  REcords from GI reviewed. Reports her urge incontinence is much improved.  Takes ditropan prn.   PMH/PSH/SH/FH/meds updated.     Review of SystemsSEe HPI     Objective:   Physical Exam  Nursing note and vitals reviewed. Constitutional: She appears well-developed and well-nourished. No distress.  Cardiovascular: Normal rate, regular rhythm and normal heart sounds.  Exam reveals no gallop and no friction rub.   No murmur heard. Pulmonary/Chest: Effort normal and breath sounds normal. No respiratory distress. She has no wheezes. She has no rales.  Skin: Skin is warm and dry. She is not diaphoretic.       No concerning lesions on back.  Melissa Gonzales does have a 10 x 15 cm area on her lumbar area that is erythematous, raised with scale.    Psychiatric: She has a normal mood and affect. Her behavior is normal. Judgment and thought content normal.          Assessment & Plan:

## 2012-04-15 NOTE — Patient Instructions (Signed)
It was great to see you today. I will look into the last Dexa scan and see if it is time to order another one. I have sent refills for your medicines.  Please let us know if you need any others. Please make an appt to come for fasting labs for your cholesterol Please come back to see me in 6 months, or sooner if you need anything.

## 2012-04-15 NOTE — Assessment & Plan Note (Signed)
AT goal.  Takes meds without problems.  Exercises.

## 2012-04-15 NOTE — Assessment & Plan Note (Signed)
Doing well wit prn ditropan.  COntinue current management

## 2012-04-17 ENCOUNTER — Telehealth: Payer: Self-pay | Admitting: *Deleted

## 2012-04-17 ENCOUNTER — Other Ambulatory Visit: Payer: Medicare Other

## 2012-04-17 DIAGNOSIS — E785 Hyperlipidemia, unspecified: Secondary | ICD-10-CM

## 2012-04-17 DIAGNOSIS — E876 Hypokalemia: Secondary | ICD-10-CM

## 2012-04-17 LAB — LIPID PANEL: Total CHOL/HDL Ratio: 4 Ratio

## 2012-04-17 LAB — BASIC METABOLIC PANEL WITH GFR
BUN: 16 mg/dL (ref 6–23)
Calcium: 9.6 mg/dL (ref 8.4–10.5)
Chloride: 100 mEq/L (ref 96–112)
Creat: 0.74 mg/dL (ref 0.50–1.10)
GFR, Est African American: >89 mL/min
GFR, Est Non African American: 82 mL/min

## 2012-04-17 NOTE — Progress Notes (Signed)
BMP AND FLP DONE TODAY Antanasia Kaczynski 

## 2012-04-17 NOTE — Telephone Encounter (Signed)
LMOM to inform patient that Bone Density scan is set up for  6/25 @ 9:30 am, patient to arrive @ 9:15am @ the [facility] center [address]. If patient unable to keep this appointment call them at [phone]

## 2012-04-17 NOTE — Addendum Note (Signed)
Addended by: Jimmy Footman K on: 04/17/2012 10:36 AM   Modules accepted: Orders

## 2012-04-17 NOTE — Telephone Encounter (Signed)
Message copied by Farrell Ours on Wed Apr 17, 2012 10:37 AM ------      Message from: [place], SARAH T      Created: Mon Apr 15, 2012  9:55 PM       I put in a dexa scan, could you schedule?  Also, there are immunizations pending.  THanks!

## 2012-04-17 NOTE — Telephone Encounter (Signed)
Patient returned call and was given the message below.

## 2012-04-22 ENCOUNTER — Encounter: Payer: Self-pay | Admitting: Family Medicine

## 2012-04-30 ENCOUNTER — Other Ambulatory Visit: Payer: Medicare Other

## 2012-05-06 ENCOUNTER — Telehealth: Payer: Self-pay | Admitting: Sports Medicine

## 2012-05-06 ENCOUNTER — Other Ambulatory Visit: Payer: Self-pay | Admitting: Sports Medicine

## 2012-05-06 ENCOUNTER — Ambulatory Visit
Admission: RE | Admit: 2012-05-06 | Discharge: 2012-05-06 | Disposition: A | Discharge status: Home / Self Care | Payer: Medicare Other | Source: Ambulatory Visit | Attending: Sports Medicine | Admitting: Sports Medicine

## 2012-05-06 ENCOUNTER — Ambulatory Visit (INDEPENDENT_AMBULATORY_CARE_PROVIDER_SITE_OTHER): Payer: Medicare Other | Admitting: Sports Medicine

## 2012-05-06 VITALS — BP 128/84

## 2012-05-06 DIAGNOSIS — M25559 Pain in unspecified hip: Secondary | ICD-10-CM

## 2012-05-06 DIAGNOSIS — M25552 Pain in left hip: Secondary | ICD-10-CM | POA: Insufficient documentation

## 2012-05-06 MED ORDER — TRAMADOL HCL 50 MG PO TABS: 50.0000 mg | ORAL_TABLET | Freq: Four times a day (QID) | ORAL | Status: AC | PRN

## 2012-05-06 MED ORDER — MELOXICAM 15 MG PO TABS: ORAL_TABLET | ORAL | Status: DC

## 2012-05-06 NOTE — Telephone Encounter (Signed)
I left a message on the patient's voicemail regarding x-rays of her lumbar spine and left hip. 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 I will try calling her again tomorrow.

## 2012-05-06 NOTE — Patient Instructions (Addendum)
Try mobic 15mg  a day for one week. Can also use ultram as needed for pain Do the low back exercises that i have circled for you I will review your xrays of your hip and low back and give you a call. May need an MRI of your back if x-rays are normal

## 2012-05-06 NOTE — Progress Notes (Signed)
  Subjective:    Patient ID: Melissa Gonzales, female    DOB: [birth year], 71 y.o.   MRN: [id number]  HPI 71 y/o female is here with left hip pain x2 weeks.  The pain started suddenly, awakening her from sleep.  She's tried aleve, ibuprofen, and massage therapy without relief.  She is taking 12 ibuprofen daily and still has pain.  The pain radiates down the anterior thigh to the knee.  She also has some lateral hip pain but the anterior pain is the worst.  She has no posterior radiating pain.  She has had some back pain in the past when she works in her garden.  She's had no recent injuries.  She's had no difficulty with hip pain in the past. No numbness or tingling. Pain is constantly present and not affected by activity. She denies any low back pain. No similar problems in the past.   Review of Systems     Objective:   Physical Exam Well-developed and well-nourished. No acute distress. She has pain with forward lumbar flexion. No pain with extension. Equivocal straight leg raise on the left. No tenderness over the greater trochanteric bursa, there is some tenderness in the left sciatic notch. Strength is 5/5 bilateral lower extremities. Flexes are 2/4 at both the Achilles and patellar tendons. Patient is intact to light touch distally. Examination of the left hip shows smooth painless hip range of motion with a negative logroll. She has some slight tenderness to palpation at the proximal left thigh. No noticeable muscle atrophy.       Assessment & Plan:  Left hip pain likely due to lumbar radiculopathy, rule out left hip osteoarthritis.  #1.80 mg of Depo-Medrol injected IM in the office today  #2. Mobic 15 mg daily with food x7 days then when necessary  #3. Ultram 50 mg every 6 hours when necessary for pain  #4. Home exercise program  #5. X-rays of both her lumbar spine and left hip. I will call her with those results. He may need to consider further diagnostic imaging if x-rays are  unremarkable.

## 2012-05-07 ENCOUNTER — Telehealth: Payer: Self-pay | Admitting: Sports Medicine

## 2012-05-07 DIAGNOSIS — M5416 Radiculopathy, lumbar region: Secondary | ICD-10-CM

## 2012-05-07 MED ORDER — HYDROCODONE-ACETAMINOPHEN 5-500 MG PO TABS: 1.0000 | ORAL_TABLET | Freq: Four times a day (QID) | ORAL | Status: AC | PRN

## 2012-05-07 NOTE — Telephone Encounter (Signed)
I spoke with Melissa Gonzales today on the phone regarding her recent x-rays. 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 My suspicion is that she is experiencing radiculopathy from this. I will order an MRI scan of her lumbar spine to evaluate this further. In the meantime, she is requesting stronger pain medication, so I will call in some Vicodin. I will followup with her via telephone with MRI findings once available at which point we will delineate further treatment.

## 2012-05-08 ENCOUNTER — Ambulatory Visit
Admission: RE | Admit: 2012-05-08 | Discharge: 2012-05-08 | Disposition: A | Discharge status: Home / Self Care | Payer: Medicare Other | Source: Ambulatory Visit | Attending: Family Medicine | Admitting: Family Medicine

## 2012-05-08 DIAGNOSIS — M8580 Other specified disorders of bone density and structure, unspecified site: Secondary | ICD-10-CM

## 2012-05-13 ENCOUNTER — Ambulatory Visit (HOSPITAL_COMMUNITY)
Admission: RE | Admit: 2012-05-13 | Discharge: 2012-05-13 | Disposition: A | Discharge status: Home / Self Care | Payer: Medicare Other | Source: Ambulatory Visit | Attending: Sports Medicine | Admitting: Sports Medicine

## 2012-05-13 ENCOUNTER — Encounter: Payer: Self-pay | Admitting: Family Medicine

## 2012-05-13 DIAGNOSIS — M79609 Pain in unspecified limb: Secondary | ICD-10-CM | POA: Insufficient documentation

## 2012-05-13 DIAGNOSIS — M25559 Pain in unspecified hip: Secondary | ICD-10-CM | POA: Insufficient documentation

## 2012-05-13 DIAGNOSIS — IMO0002 Reserved for concepts with insufficient information to code with codable children: Secondary | ICD-10-CM | POA: Insufficient documentation

## 2012-05-13 DIAGNOSIS — M5416 Radiculopathy, lumbar region: Secondary | ICD-10-CM

## 2012-05-13 DIAGNOSIS — M48061 Spinal stenosis, lumbar region without neurogenic claudication: Secondary | ICD-10-CM | POA: Insufficient documentation

## 2012-05-14 ENCOUNTER — Encounter: Payer: Self-pay | Admitting: *Deleted

## 2012-05-14 ENCOUNTER — Telehealth: Payer: Self-pay | Admitting: Sports Medicine

## 2012-05-14 NOTE — Telephone Encounter (Signed)
Spoke with patient on the telephone today regarding MRI findings of her lumbar spine. She has severe spinal stenosis and has a leftward disc bulge at L2-L3 which appears to compromise the L2 nerve root. I discussed the possibility of consultation with either neurosurgery or with Dr. Maurice Small. Patient is not interested in anything surgical at this time. Therefore I will refer her to Dr. Maurice Small for further evaluation and treatment. Hopefully she'll get some benefit from a series of lumbar ESI. She would like for her appointment to take place week after next if possible. Further treatment will be per the discretion of Dr. Maurice Small.

## 2012-05-14 NOTE — Patient Instructions (Signed)
DR IBAZABO [address] TUES 7.23.13 AT 10AM [phone] PT INFORMED

## 2012-05-21 ENCOUNTER — Other Ambulatory Visit: Payer: Self-pay | Admitting: *Deleted

## 2012-07-12 ENCOUNTER — Other Ambulatory Visit: Payer: Self-pay | Admitting: Family Medicine

## 2012-09-19 ENCOUNTER — Other Ambulatory Visit: Payer: Self-pay | Admitting: Family Medicine

## 2012-09-20 ENCOUNTER — Other Ambulatory Visit: Payer: Self-pay | Admitting: *Deleted

## 2012-09-20 NOTE — Telephone Encounter (Signed)
I have never seen this pt before. Last office visit was in June/13 and this problems was not addressed. Pt needs appointment. She needs re-evaluation of her condition that requires this medication.

## 2012-10-10 ENCOUNTER — Encounter: Payer: Self-pay | Admitting: Family Medicine

## 2012-10-10 ENCOUNTER — Ambulatory Visit (HOSPITAL_COMMUNITY)
Admission: RE | Admit: 2012-10-10 | Discharge: 2012-10-10 | Disposition: A | Discharge status: Home / Self Care | Payer: Medicare Other | Source: Ambulatory Visit | Attending: Family Medicine | Admitting: Family Medicine

## 2012-10-10 ENCOUNTER — Ambulatory Visit (INDEPENDENT_AMBULATORY_CARE_PROVIDER_SITE_OTHER): Payer: Medicare Other | Admitting: Family Medicine

## 2012-10-10 VITALS — BP 148/80 | HR 89 | Temp 98.0°F | Ht 65.0 in | Wt 149.0 lb

## 2012-10-10 DIAGNOSIS — I10 Essential (primary) hypertension: Secondary | ICD-10-CM

## 2012-10-10 DIAGNOSIS — N951 Menopausal and female climacteric states: Secondary | ICD-10-CM

## 2012-10-10 DIAGNOSIS — E7800 Pure hypercholesterolemia, unspecified: Secondary | ICD-10-CM

## 2012-10-10 DIAGNOSIS — I491 Atrial premature depolarization: Secondary | ICD-10-CM | POA: Insufficient documentation

## 2012-10-10 MED ORDER — ESTROGENS, CONJUGATED 0.625 MG/GM VA CREA: TOPICAL_CREAM | Freq: Every day | VAGINAL | Status: DC

## 2012-10-10 MED ORDER — LISINOPRIL 20 MG PO TABS: 20.0000 mg | ORAL_TABLET | Freq: Every day | ORAL | Status: DC

## 2012-10-10 NOTE — Patient Instructions (Addendum)
Your EKG results without symptoms is interpret a within normal limits. Your medications have been refilled. Make a follow up appointment in 4 months or sooner if needed.

## 2012-10-13 MED ORDER — ATORVASTATIN CALCIUM 10 MG PO TABS: 10.0000 mg | ORAL_TABLET | Freq: Every day | ORAL | Status: DC

## 2012-10-13 NOTE — Progress Notes (Signed)
Family Medicine Office Visit Note   Subjective:   Patient ID: Melissa Gonzales, female  DOB: 15-Feb-1941, 71 y.o.. MRN: [id number]   Pt that comes today to meet new doctor. She is part of a study (not a medical trial) when she get frequent labs and EKG's. Her recent results showed that EKG was abnormal and she received a letter regarding this. No diagnosis was made but repeat EKG was recommended. Pt denies SOB, chest pain, palpitations, headaches, dizziness, numbness or weakness. No changes on BM habits. No unintentional weigh loss/gain.  Pt has Urge incontinence and complaints of vaginal dryness. She has been using Premarin cream for this condition for years and reports that help with irritation due to tissue atrophy. She desires to continue treatment.  Review of Systems:  Per HPI Objective:   Physical Exam: Gen:  NAD HEENT: Moist mucous membranes  CV: Regular rate and rhythm, no murmurs rubs or gallops PULM: Clear to auscultation bilaterally. No wheezes/rales/rhonchi ABD: Soft, non tender, non distended, normal bowel sounds EXT: No edema Neuro: Alert and oriented x3. No focalization  Assessment & Plan:

## 2012-10-13 NOTE — Assessment & Plan Note (Addendum)
Pt takes lipitor 40mg  daily. Last Lipid profile was on June/2013 of 106. Improved from prior year when it was 112. Goal for pt per risk stratification is LDL <130. (risk are age and HTN : moderated) Pt is at goal, but desires to continue on Lipitor. Plan: Decrease Lipitor.

## 2012-10-13 NOTE — Assessment & Plan Note (Addendum)
Pt on HCTZ 12.5 and Lisinopril 20 mg. BP controlled.  EKG performed during office visit showed: Sinus rhythm with Premature atrial complexes.Otherwise normal ECG. No previous tracing. Pt asymptomatic. Plan: Continue same regimen.

## 2012-12-12 ENCOUNTER — Other Ambulatory Visit: Payer: Self-pay | Admitting: Family Medicine

## 2012-12-18 ENCOUNTER — Telehealth: Payer: Self-pay | Admitting: Family Medicine

## 2012-12-18 NOTE — Telephone Encounter (Signed)
Spoke with patient and informed her that I called in rx

## 2012-12-18 NOTE — Telephone Encounter (Signed)
Pt states that the pharmacy has not rec'd refill for her fiorcet - looks like it was approved - "print"

## 2012-12-26 ENCOUNTER — Other Ambulatory Visit: Payer: Self-pay | Admitting: Family Medicine

## 2012-12-26 DIAGNOSIS — Z1231 Encounter for screening mammogram for malignant neoplasm of breast: Secondary | ICD-10-CM

## 2013-01-11 ENCOUNTER — Other Ambulatory Visit: Payer: Self-pay | Admitting: Family Medicine

## 2013-01-22 ENCOUNTER — Ambulatory Visit
Admission: RE | Admit: 2013-01-22 | Discharge: 2013-01-22 | Disposition: A | Discharge status: Home / Self Care | Payer: Medicare Other | Source: Ambulatory Visit | Attending: Family Medicine | Admitting: Family Medicine

## 2013-01-22 DIAGNOSIS — Z1231 Encounter for screening mammogram for malignant neoplasm of breast: Secondary | ICD-10-CM

## 2013-02-26 ENCOUNTER — Other Ambulatory Visit: Payer: Self-pay | Admitting: Family Medicine

## 2013-02-26 MED ORDER — BUTALBITAL-APAP-CAFFEINE 50-325-40 MG PO TABS: 1.0000 | ORAL_TABLET | Freq: Two times a day (BID) | ORAL | Status: DC | PRN

## 2013-04-04 ENCOUNTER — Ambulatory Visit (INDEPENDENT_AMBULATORY_CARE_PROVIDER_SITE_OTHER): Payer: Medicare Other | Admitting: Family Medicine

## 2013-04-04 ENCOUNTER — Encounter: Payer: Self-pay | Admitting: Family Medicine

## 2013-04-04 VITALS — BP 130/78 | HR 102 | Temp 97.8°F | Ht 65.0 in | Wt 149.2 lb

## 2013-04-04 DIAGNOSIS — R300 Dysuria: Secondary | ICD-10-CM

## 2013-04-04 DIAGNOSIS — R350 Frequency of micturition: Secondary | ICD-10-CM | POA: Insufficient documentation

## 2013-04-04 LAB — POCT URINALYSIS DIPSTICK
Bilirubin, UA: NEGATIVE
Ketones, UA: NEGATIVE
Protein, UA: NEGATIVE
Spec Grav, UA: 1.01
pH, UA: 5.5

## 2013-04-04 MED ORDER — PHENAZOPYRIDINE HCL 100 MG PO TABS: 100.0000 mg | ORAL_TABLET | Freq: Three times a day (TID) | ORAL | Status: DC | PRN

## 2013-04-04 MED ORDER — CEPHALEXIN 500 MG PO CAPS: 500.0000 mg | ORAL_CAPSULE | Freq: Two times a day (BID) | ORAL | Status: DC

## 2013-04-04 NOTE — Progress Notes (Signed)
Patient ID: [name], female   DOB: 18-Sep-1941, 72 y.o.   MRN: [id number]  [facility] Kayelyn Lemon M. Dody Smartt, MD Phone: [phone]   Subjective: HPI: Patient is a 72 y.o. female presenting to clinic today for same day appointment. Concerns today include urinary frequency.  Has history of bladder spasms which improved with oxybutin. Started having more bladder spasm, and having urgency/frequency for the last week. Also reports right sided back pain. No history of kidney stones.  Onset: 1 week    Worsening: yes    Symptoms Urgency: yes  Frequency: yes  Hesitancy: yes  Hematuria: no, but some pink discharge on underwear  Flank Pain: yes, on right side Fever: no    Nausea/Vomiting: no  Pregnant: no STD exposure/history: no Discharge: no Irritants: no Rash: no  Red Flags  : (Risk Factors for Complicated UTI) Recent Antibiotic Usage (last 30 days): no  Symptoms lasting more than seven (7) days: yes  More than 3 UTI's last 12 months: no  PMH of  1. DM: no 2. Renal Disease/Calculi: no 3. Urinary Tract Abnormality: no  4. Instrumentation/Trauma: no 5. Immunosuppression: no  History Reviewed: Non- smoker.  ROS: Please see HPI above.  Objective: Office vital signs reviewed. BP 130/78  Pulse 102  Temp(Src) 97.8 F (36.6 C) (Oral)  Ht 5\' 5"  (1.651 m)  Wt 149 lb 3.2 oz (67.677 kg)  BMI 24.83 kg/m2  Physical Examination:  General: Awake, alert. NAD HEENT: Atraumatic, normocephalic. MMM Pulm: CTAB, no wheezes Cardio: RRR, no murmurs appreciated Abdomen:+BS, soft, nontender, nondistended. No masses palpated. +CVA tenderness Neuro: Grossly intact  Assessment: 72 y.o. female with urinary frequency, likely UTI vs. Kidney stone  Plan: See Problem List and After Visit Summary

## 2013-04-04 NOTE — Assessment & Plan Note (Signed)
UA shows leukocytes and blood. No history of kidney stones, but with CVA tenderness could be due to new stone. Will treat as UTI given her symptoms with Keflex 500mg  bid x10 days as well as pyridium for symptoms. Will culture urine. Given red flags that should prompt immediate evaluation. If not improved in 5 days on antibiotics, RTC for evaluation including possible renal ultrasound. Pt agrees.

## 2013-04-04 NOTE — Patient Instructions (Addendum)
It was nice to meet you today. I am sorry you are not feeling well. I will call you with your urine culture to let you know if it grows bacteria.  I have sent in Keflex for your antibiotic and pyridium for bladder spasm, if you want to take it.  If you have any change, please be seen over the weekend. If you are not better in 5 days, please come back.  Tommye Lehenbauer M. Linwood Carmack, M.D.

## 2013-04-06 ENCOUNTER — Other Ambulatory Visit: Payer: Self-pay | Admitting: Family Medicine

## 2013-04-07 ENCOUNTER — Other Ambulatory Visit: Payer: Self-pay | Admitting: *Deleted

## 2013-04-07 LAB — URINE CULTURE: Colony Count: 10000

## 2013-04-07 MED ORDER — PHENAZOPYRIDINE HCL 100 MG PO TABS: 100.0000 mg | ORAL_TABLET | Freq: Three times a day (TID) | ORAL | Status: DC | PRN

## 2013-04-23 ENCOUNTER — Ambulatory Visit (INDEPENDENT_AMBULATORY_CARE_PROVIDER_SITE_OTHER): Payer: Medicare Other | Admitting: Family Medicine

## 2013-04-23 VITALS — BP 135/73 | HR 83 | Temp 98.9°F | Ht 65.0 in | Wt 148.0 lb

## 2013-04-23 DIAGNOSIS — F32A Depression, unspecified: Secondary | ICD-10-CM

## 2013-04-23 DIAGNOSIS — E7800 Pure hypercholesterolemia, unspecified: Secondary | ICD-10-CM

## 2013-04-23 DIAGNOSIS — F3289 Other specified depressive episodes: Secondary | ICD-10-CM

## 2013-04-23 DIAGNOSIS — I10 Essential (primary) hypertension: Secondary | ICD-10-CM

## 2013-04-23 DIAGNOSIS — F411 Generalized anxiety disorder: Secondary | ICD-10-CM

## 2013-04-23 DIAGNOSIS — F329 Major depressive disorder, single episode, unspecified: Secondary | ICD-10-CM

## 2013-04-23 DIAGNOSIS — N3941 Urge incontinence: Secondary | ICD-10-CM

## 2013-04-23 MED ORDER — ALPRAZOLAM 0.25 MG PO TABS: ORAL_TABLET | ORAL | Status: DC

## 2013-04-23 MED ORDER — ZOLPIDEM TARTRATE 5 MG PO TABS: 5.0000 mg | ORAL_TABLET | Freq: Every evening | ORAL | Status: DC | PRN

## 2013-04-23 MED ORDER — ATORVASTATIN CALCIUM 40 MG PO TABS: 40.0000 mg | ORAL_TABLET | Freq: Every day | ORAL | Status: DC

## 2013-04-24 NOTE — Patient Instructions (Addendum)
It has been a pleasure to see you today. Please take the medications as prescribed. Make your next appointment in 1-2 months or sooner if needed.

## 2013-04-24 NOTE — Assessment & Plan Note (Addendum)
On Xanax PRN. Only taking it twice a week. Prescription printed today.

## 2013-04-24 NOTE — Assessment & Plan Note (Signed)
Controlled continue current plan.

## 2013-04-24 NOTE — Progress Notes (Signed)
Family Medicine Office Visit Note   Subjective:   Patient ID: Melissa Gonzales, female  DOB: 10-14-1941, 72 y.o.. MRN: [id number]   Pt that comes today complaining of "bladder spasms". She was recently here (04/04/13) and treated for UTI. She complete the course of abx and report her symptoms have improved but not resolved completely. Urine Culture grew multiple morphocytes. She denies fever of flank pain. Pt has been on Oxybutini  Pt also reports that stopped taking Zoloft since Jan/2014 and was feeling better, but started developing insomnia. She was on Amitriptyline in the past and had some pills left that she used to help her with sleep. Pt reports this has improved since, and is requesting prescription for this. She reports takes xanax for anxiety but only once or twice in a week period.  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 Pt denies any hx of GI bleeding.  Pap smear differed since pt has had total hysterectomy.   Review of Systems:  Pt denies SOB, chest pain, palpitations, headaches, dizziness, numbness or weakness. No changes on urinary or BM habits. No unintentional weigh loss/gain.  Objective:   Physical Exam: Gen:  NAD HEENT: Moist mucous membranes  CV: Regular rate and rhythm, no murmurs rubs or gallops PULM: Clear to auscultation bilaterally. No wheezes/rales/rhonchi ABD: Soft, non tender, non distended, normal bowel sounds EXT: No edema Neuro: Alert and oriented x3. No focalization Psych: normal mood and affect. Normal speech. No agitation.   Assessment & Plan:

## 2013-04-24 NOTE — Assessment & Plan Note (Addendum)
Pt stopped Zoloft on her own. She has been self administering Amitriptyline PRN for insomnia. No suicidal ideation.  P/ Pt will benefit from SSRI, but refuses recommendation. Amitriptyline is not a good med for her. We recommend discontinuation. Ambien 5mg  prescribed today. F/ u as needed.

## 2013-04-24 NOTE — Assessment & Plan Note (Addendum)
UTI symptoms of frequency has resolved. Pt continue with urge incontinence. Started taking TCA (amitriptyline) without medical advice. P/ Can use Oxybutynin in patch formulation if PRN seems difficult for compliance. Stop using TCA as this can add to anticholinergic effect and it is not recommended with pt's age. F/u as needed

## 2013-04-24 NOTE — Assessment & Plan Note (Signed)
Back to Lipitor 40mg  daily. With new guideline dose of 10mg  are not indicated.

## 2013-05-01 ENCOUNTER — Other Ambulatory Visit: Payer: Self-pay | Admitting: *Deleted

## 2013-05-02 MED ORDER — HYDROCHLOROTHIAZIDE 12.5 MG PO CAPS: 12.5000 mg | ORAL_CAPSULE | Freq: Every day | ORAL | Status: DC

## 2013-05-12 ENCOUNTER — Telehealth: Payer: Self-pay | Admitting: *Deleted

## 2013-05-12 ENCOUNTER — Other Ambulatory Visit: Payer: Self-pay | Admitting: Family Medicine

## 2013-05-12 MED ORDER — OXYBUTYNIN CHLORIDE ER 10 MG PO TB24: 10.0000 mg | ORAL_TABLET | Freq: Every day | ORAL | Status: DC

## 2013-05-12 NOTE — Telephone Encounter (Signed)
Requesting refill on oxybutynin date written 01/22/2012 - last refilled 12/12/2012 - not on [facility] Wyatt Haste, RN-BSN

## 2013-05-12 NOTE — Telephone Encounter (Signed)
Medication refilled

## 2013-05-12 NOTE — Telephone Encounter (Signed)
Another refill request for oxybutynin -Wyatt Haste, RN-BSN

## 2013-05-14 ENCOUNTER — Encounter: Payer: Self-pay | Admitting: Family Medicine

## 2013-05-14 ENCOUNTER — Ambulatory Visit (INDEPENDENT_AMBULATORY_CARE_PROVIDER_SITE_OTHER): Payer: Medicare Other | Admitting: Family Medicine

## 2013-05-14 VITALS — BP 145/74 | HR 92 | Temp 97.9°F | Ht 65.0 in | Wt 148.1 lb

## 2013-05-14 DIAGNOSIS — I10 Essential (primary) hypertension: Secondary | ICD-10-CM

## 2013-05-14 DIAGNOSIS — R7309 Other abnormal glucose: Secondary | ICD-10-CM

## 2013-05-14 DIAGNOSIS — R739 Hyperglycemia, unspecified: Secondary | ICD-10-CM

## 2013-05-14 DIAGNOSIS — M19049 Primary osteoarthritis, unspecified hand: Secondary | ICD-10-CM

## 2013-05-14 DIAGNOSIS — N3941 Urge incontinence: Secondary | ICD-10-CM

## 2013-05-14 DIAGNOSIS — M2540 Effusion, unspecified joint: Secondary | ICD-10-CM

## 2013-05-14 DIAGNOSIS — G4700 Insomnia, unspecified: Secondary | ICD-10-CM

## 2013-05-14 DIAGNOSIS — E7800 Pure hypercholesterolemia, unspecified: Secondary | ICD-10-CM

## 2013-05-14 DIAGNOSIS — G43909 Migraine, unspecified, not intractable, without status migrainosus: Secondary | ICD-10-CM

## 2013-05-14 LAB — LIPID PANEL
Cholesterol: 201 mg/dL — ABNORMAL HIGH (ref 0–200)
HDL: 57 mg/dL (ref >39)
LDL Cholesterol: 123 mg/dL — ABNORMAL HIGH (ref 0–99)
Total CHOL/HDL Ratio: 3.5 Ratio
Triglycerides: 104 mg/dL (ref <150)
VLDL: 21 mg/dL (ref 0–40)

## 2013-05-14 LAB — CBC
MCHC: 34.1 g/dL (ref 30.0–36.0)
RDW: 13.1 % (ref 11.5–15.5)
WBC: 4.3 10*3/uL (ref 4.0–10.5)

## 2013-05-14 LAB — POCT SEDIMENTATION RATE: POCT SED RATE: 4 mm/hr (ref 0–22)

## 2013-05-14 LAB — BASIC METABOLIC PANEL
Chloride: 98 mEq/L (ref 96–112)
Potassium: 3.9 mEq/L (ref 3.5–5.3)
Sodium: 139 mEq/L (ref 135–145)

## 2013-05-14 MED ORDER — BUTALBITAL-APAP-CAFFEINE 50-325-40 MG PO TABS: 1.0000 | ORAL_TABLET | Freq: Two times a day (BID) | ORAL | Status: DC | PRN

## 2013-05-14 NOTE — Assessment & Plan Note (Addendum)
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 No signs of acute septic joint. P/ RF, CCP, ANA, ESR, CRP Followup when results are available.

## 2013-05-14 NOTE — Patient Instructions (Addendum)
It is a pleasure to see you today. Please take the medications as prescribed. I will call you with the labs results if they come back abnormal otherwise we will discuss them at your next appointment.  Make your next appointment in 2-3 months or sooner if needed.

## 2013-05-14 NOTE — Assessment & Plan Note (Addendum)
No signs of UTI. Pt has already an appointment with urology for next Wednesday. We'll follow up on specialist recommendations.

## 2013-05-14 NOTE — Addendum Note (Signed)
Addended by: Jennette Bill on: 05/14/2013 12:06 PM   Modules accepted: Orders

## 2013-05-14 NOTE — Progress Notes (Signed)
Family Medicine Office Visit Note   Subjective:   Patient ID: Melissa Gonzales, female  DOB: 1941-09-02, 72 y.o.. MRN: [id number]   Pt that comes today for f/u insomnia. She was prescribed Ambien and reports is working.   Her other concerns include:  Joint swelling of her mid phalanx of left pointer finger and on 2nd distal  metacarpal joint of her right hand. This joints are intermittently painful and with deformed. She reports has to use rubber bands around jars/containers to have an extra grip in order to open them due to pain. Pt denies morning stiffness and any other joint involvement. No FMHx of RA.  Urinary incontinence: pt reports has an appointment with Dr. Perley Jain Urologist at [facility] next Wednesday to discuss this issue. Oxybutynin works for her but when she is off meds continues to have what she calls "bladder spasms" that happens when she needs to urinate and this results in incontinence if she does not go fast enough. She denies dysuria, frequency, flank pain, nausea, vomiting, fever or chills.  Migraine: she reports has long standing migrainous and tension headaches for which she takes Fioricet PRN. Her HA happen in an irregular pattern, denies increase in frequency or intensity. No visual changes or focal neurologic symptoms.  Review of Systems:  Per HPI plus denies SOB, chest pain, palpitations, dizziness. No unintentional weigh loss/gain.  Objective:   Physical Exam: Gen:  NAD HEENT: Moist mucous membranes  CV: Regular rate and rhythm, no murmurs rubs or gallops PULM: Clear to auscultation bilaterally. No wheezes/rales/rhonchi ABD: Soft, non tender, non distended, normal bowel sounds. No CVA tenderness. EXT: Left 2nd finger and Right 2nd distal metacarpal joints have marked deformity, no edema, erythema or calor. There is tenderness with extreme ROM and limitation as well due to mentioned deformity.  Neuro: Alert and oriented x3. No focalization. Normal  reflexes.  Assessment & Plan:

## 2013-05-14 NOTE — Assessment & Plan Note (Signed)
Lipid profile drawn today.

## 2013-05-14 NOTE — Assessment & Plan Note (Signed)
On Fioricet PRN. No signs of worsening condition. P/ No change in therapy.

## 2013-05-14 NOTE — Assessment & Plan Note (Signed)
Controlled,  no change in her regimen P/ Annual CBC and BMET

## 2013-05-14 NOTE — Assessment & Plan Note (Signed)
Ambien seems to work for her. We'll continue this medication prn.

## 2013-05-15 LAB — C-REACTIVE PROTEIN: CRP: <0.5 mg/dL

## 2013-05-15 LAB — RHEUMATOID FACTOR: Rheumatoid fact SerPl-aCnc: <10 [IU]/mL

## 2013-05-22 ENCOUNTER — Encounter: Payer: Self-pay | Admitting: Family Medicine

## 2013-05-23 ENCOUNTER — Telehealth: Payer: Self-pay | Admitting: Family Medicine

## 2013-05-23 NOTE — Telephone Encounter (Signed)
Pt is requesting a copy of her lab results mailed to her home . JW

## 2013-05-23 NOTE — Telephone Encounter (Signed)
Will fwd to MD to authorize or compose a letter with lab results.  Matylda Fehring, Darlyne Russian, CMA

## 2013-05-24 ENCOUNTER — Encounter: Payer: Self-pay | Admitting: Family Medicine

## 2013-06-04 ENCOUNTER — Encounter: Payer: Self-pay | Admitting: Family Medicine

## 2013-06-04 NOTE — Telephone Encounter (Signed)
Letter with labs results have been printed and sent to pt.

## 2013-06-16 ENCOUNTER — Other Ambulatory Visit: Payer: Self-pay | Admitting: *Deleted

## 2013-06-19 MED ORDER — ESOMEPRAZOLE MAGNESIUM 40 MG PO CPDR: 40.0000 mg | DELAYED_RELEASE_CAPSULE | Freq: Every day | ORAL | Status: DC

## 2013-07-10 ENCOUNTER — Other Ambulatory Visit: Payer: Self-pay | Admitting: *Deleted

## 2013-07-18 ENCOUNTER — Encounter: Payer: Self-pay | Admitting: Family Medicine

## 2013-07-18 ENCOUNTER — Ambulatory Visit (INDEPENDENT_AMBULATORY_CARE_PROVIDER_SITE_OTHER): Payer: Medicare Other | Admitting: Family Medicine

## 2013-07-18 VITALS — BP 150/69 | HR 77 | Temp 98.6°F | Ht 65.0 in | Wt 150.7 lb

## 2013-07-18 DIAGNOSIS — F329 Major depressive disorder, single episode, unspecified: Secondary | ICD-10-CM

## 2013-07-18 DIAGNOSIS — F32A Depression, unspecified: Secondary | ICD-10-CM

## 2013-07-18 DIAGNOSIS — F3289 Other specified depressive episodes: Secondary | ICD-10-CM

## 2013-07-18 MED ORDER — ALPRAZOLAM 0.25 MG PO TABS: ORAL_TABLET | ORAL | Status: DC

## 2013-07-18 NOTE — Progress Notes (Signed)
Family Medicine Office Visit Note   Subjective:   Patient ID: [name], female  DOB: July 29, 1941, 72 y.o.. MRN: [id number]   Pt that comes today for her Alprazolam refill. She reports has been in a lot of stress lately with her youngest daughter. Pt states started on Zoloft 3 weeks ago, taking 50 mg daily but has not seen any improvement. She is going to counseling with a therapist she sees twice a week and also has been doing Yoga. She reports sad mood but denies suicidal ideation or thoughts. She does endorses anhedonia and difficulty sleeping.  Review of Systems:  Pt denies SOB, chest pain, palpitations, headaches, dizziness, numbness or weakness. No changes on urinary or BM habits. No unintentional weigh loss/gain.  Objective:   Physical Exam: Gen:  NAD HEENT: Moist mucous membranes  CV: Regular rate and rhythm, no murmurs rubs or gallops PULM: Clear to auscultation bilaterally. No wheezes/rales/rhonchi Neuro: Alert and oriented x3. No focalization Psych: sad mood and affect, tearful, normal speech. No agitation. No suicidal ideations or plans.  Assessment & Plan:

## 2013-07-18 NOTE — Patient Instructions (Addendum)
Increase the dose of Zoloft to 2 tab a day. F/u with me in 2-3 weeks or sooner if needed. Please get help right away if you develop suicidal thoughts or plans.

## 2013-07-18 NOTE — Assessment & Plan Note (Signed)
Restarted Zoloft 3 weeks ago, has not noticed improvement of her symptoms. Denies SI or SP P/ Zoloft increased to 100mg  until her next appointment withme in 2-3 weeks  (max dose is 200mg ) Alprazolam refilled Continue with Psychotherapy and Exercise (yoga)

## 2013-07-31 ENCOUNTER — Ambulatory Visit (INDEPENDENT_AMBULATORY_CARE_PROVIDER_SITE_OTHER): Payer: Medicare Other | Admitting: Family Medicine

## 2013-07-31 ENCOUNTER — Encounter: Payer: Self-pay | Admitting: Family Medicine

## 2013-07-31 VITALS — BP 147/68 | HR 88 | Temp 99.5°F | Ht 65.0 in | Wt 152.0 lb

## 2013-07-31 DIAGNOSIS — F3289 Other specified depressive episodes: Secondary | ICD-10-CM

## 2013-07-31 DIAGNOSIS — G43909 Migraine, unspecified, not intractable, without status migrainosus: Secondary | ICD-10-CM

## 2013-07-31 DIAGNOSIS — F329 Major depressive disorder, single episode, unspecified: Secondary | ICD-10-CM

## 2013-07-31 DIAGNOSIS — F32A Depression, unspecified: Secondary | ICD-10-CM

## 2013-07-31 MED ORDER — BUTALBITAL-APAP-CAFF-COD 50-325-40-30 MG PO CAPS: 1.0000 | ORAL_CAPSULE | Freq: Four times a day (QID) | ORAL | Status: DC | PRN

## 2013-07-31 NOTE — Progress Notes (Signed)
Family Medicine Office Visit Note   Subjective:   Patient ID: Melissa Gonzales, female  DOB: 21-Jul-1941, 72 y.o.. MRN: [id number]   Pt that comes today to f/u Zoloft started 2 weeks ago. She reports is better, her mood is more positive and denies anhedonia. She reports is sleeping better and only taking occasional Alprazolam. She reports did not do well on Ambien and she stopped taking it.  Denies noticeable side effects of this medications and wishes to continue current regimen.   Review of Systems:  Pt denies SOB, chest pain, palpitations, headaches, dizziness, numbness or weakness. No changes on urinary or BM habits. No unintentional weigh loss/gain.  Objective:   Physical Exam: Gen:  NAD Psych: normal mood and affect. No agitation, suicidal thoughts/plans. No hallucinations.   Assessment & Plan:

## 2013-07-31 NOTE — Assessment & Plan Note (Signed)
Doing better on Zoloft 100mg . No change in therapy F/u in 2-3 months or sooner if needed.

## 2013-07-31 NOTE — Assessment & Plan Note (Signed)
Stable.  Fioricet prescription refilled with 2 refills.

## 2013-07-31 NOTE — Patient Instructions (Addendum)
It has been a pleasure to see you today. Please continue taking the medications as prescribed.  Make your next appointment in 2 -3 months.

## 2013-08-01 ENCOUNTER — Other Ambulatory Visit: Payer: Self-pay | Admitting: Family Medicine

## 2013-08-01 MED ORDER — BUTALBITAL-APAP-CAFF-COD 50-325-40-30 MG PO CAPS: 1.0000 | ORAL_CAPSULE | Freq: Four times a day (QID) | ORAL | Status: DC | PRN

## 2013-09-09 ENCOUNTER — Telehealth: Payer: Self-pay | Admitting: *Deleted

## 2013-09-09 NOTE — Telephone Encounter (Signed)
Pharmacy calling asking for refill for patients alprazolam., will forward to PCP.

## 2013-09-11 NOTE — Telephone Encounter (Signed)
Pt called because she said that we have not responded to the pharmacy and her request for refill of alprazolam. She said that we told the pharmacy that she is seeing another doctor and we could not fill her xanax. JW

## 2013-09-12 ENCOUNTER — Other Ambulatory Visit: Payer: Self-pay | Admitting: Family Medicine

## 2013-09-12 MED ORDER — ALPRAZOLAM 0.25 MG PO TABS: ORAL_TABLET | ORAL | Status: DC

## 2013-09-12 NOTE — Telephone Encounter (Signed)
I have not received any request from pharmacy regarding pt's alprazolam. I have refilled medication with three additional refills. Printed prescription was faxed to pt's pharmacy.

## 2013-09-29 ENCOUNTER — Telehealth: Payer: Self-pay | Admitting: Family Medicine

## 2013-09-29 NOTE — Telephone Encounter (Signed)
Dr increased Zolfot(generic) dosage to 2 per day, Pt needs prescription to reflect the increase in dosage Please advise [facility]

## 2013-09-30 ENCOUNTER — Other Ambulatory Visit: Payer: Self-pay | Admitting: Family Medicine

## 2013-09-30 MED ORDER — SERTRALINE HCL 50 MG PO TABS: 50.0000 mg | ORAL_TABLET | Freq: Every day | ORAL | Status: DC

## 2013-09-30 NOTE — Telephone Encounter (Signed)
LVM for patient to call back. ?

## 2013-09-30 NOTE — Telephone Encounter (Signed)
Sertraline 50 mg BID sent to pharmacy with 3 refills. Thanks

## 2013-10-01 ENCOUNTER — Telehealth: Payer: Self-pay | Admitting: *Deleted

## 2013-10-01 NOTE — Telephone Encounter (Signed)
[facility] pharmacy called and stated that patient's zoloft was written incorrectly. States that she is supposed to take 1 tablet BID. Is that correct?

## 2013-10-01 NOTE — Telephone Encounter (Signed)
Spoke with patient and informed her of below 

## 2013-10-07 ENCOUNTER — Other Ambulatory Visit: Payer: Self-pay | Admitting: *Deleted

## 2013-10-07 MED ORDER — SERTRALINE HCL 50 MG PO TABS: ORAL_TABLET | ORAL | Status: DC

## 2013-10-07 NOTE — Telephone Encounter (Signed)
Pt prescription is right, she needs 200mg  daily of Zoloft (one time daily dose).

## 2013-11-26 ENCOUNTER — Encounter: Payer: Self-pay | Admitting: Family Medicine

## 2013-11-26 ENCOUNTER — Ambulatory Visit (INDEPENDENT_AMBULATORY_CARE_PROVIDER_SITE_OTHER): Payer: Medicare Other | Admitting: Family Medicine

## 2013-11-26 VITALS — BP 132/85 | HR 76 | Temp 97.0°F | Ht 64.5 in | Wt 151.5 lb

## 2013-11-26 DIAGNOSIS — F329 Major depressive disorder, single episode, unspecified: Secondary | ICD-10-CM

## 2013-11-26 DIAGNOSIS — F3289 Other specified depressive episodes: Secondary | ICD-10-CM

## 2013-11-26 DIAGNOSIS — G43909 Migraine, unspecified, not intractable, without status migrainosus: Secondary | ICD-10-CM

## 2013-11-26 DIAGNOSIS — F411 Generalized anxiety disorder: Secondary | ICD-10-CM

## 2013-11-26 DIAGNOSIS — F32A Depression, unspecified: Secondary | ICD-10-CM

## 2013-11-26 MED ORDER — ALPRAZOLAM 0.25 MG PO TABS: ORAL_TABLET | ORAL | Status: DC

## 2013-11-26 MED ORDER — SERTRALINE HCL 100 MG PO TABS: 100.0000 mg | ORAL_TABLET | Freq: Every day | ORAL | Status: DC

## 2013-11-26 MED ORDER — BUTALBITAL-APAP-CAFFEINE 50-325-40 MG PO TABS: 1.0000 | ORAL_TABLET | Freq: Two times a day (BID) | ORAL | Status: DC | PRN

## 2013-11-27 NOTE — Patient Instructions (Signed)
No changes in your current plan. F/u in 3 months or sooner if needed.

## 2013-11-27 NOTE — Assessment & Plan Note (Signed)
Stable on Fioricet PRN. Prescription given today.

## 2013-11-27 NOTE — Assessment & Plan Note (Signed)
Continues on Xanax PRN. Discussed risk and benefits of medication with her age.

## 2013-11-27 NOTE — Assessment & Plan Note (Signed)
Controlled on Zoloft 100 mg. Prescription adjusted to take 1 tab daily. F/u in 3 months or sooner if needed.

## 2013-11-27 NOTE — Progress Notes (Signed)
Family Medicine Office Visit Note   Subjective:   Patient ID: Quitman Livingsarol A Bremner, female  DOB: [birth year], 73 y.o.. MRN: [id number]   Pt that comes today for prescription refill. She reports doing well on Zoloft 100 mg daily and denies noticeable side effects. She continues struggle with her family issues but is able to keep calm.  She reports more involvement at church and happy for an upcoming trip to [place] with her older daughter.   Headaches are stable and Fioricet helps to treat symptomatically. Denies side effects of this medication.requesting refill.   Review of Systems:  Pt denies SOB, chest pain, palpitations, headaches, dizziness, numbness or weakness. No changes on urinary or BM habits. No unintentional weigh loss/gain.  Objective:   Physical Exam: Gen:  NAD HEENT: Moist mucous membranes  CV: Regular rate and rhythm, no murmurs rubs or gallops PULM: Clear to auscultation bilaterally. No wheezes/rales/rhonchi ABD: Soft, non tender, non distended, normal bowel sounds EXT: No edema Neuro: Alert and oriented x3. No focalization Psych: normal mood and affect. Normal speech. Normal though process. No hallucinations/ delusions. No agitation.  Assessment & Plan:

## 2014-01-28 ENCOUNTER — Telehealth: Payer: Self-pay | Admitting: *Deleted

## 2014-01-28 NOTE — Telephone Encounter (Signed)
LM for patient to call back.  Please ask if she has received flu shot.  If so where and when, if not does she want to get one here. Jazmin Hartsell,CMA

## 2014-02-09 ENCOUNTER — Other Ambulatory Visit: Payer: Self-pay | Admitting: *Deleted

## 2014-02-10 MED ORDER — BUTALBITAL-APAP-CAFFEINE 50-325-40 MG PO TABS: 1.0000 | ORAL_TABLET | Freq: Two times a day (BID) | ORAL | Status: DC | PRN

## 2014-03-04 ENCOUNTER — Other Ambulatory Visit: Payer: Self-pay

## 2014-03-04 DIAGNOSIS — Z1231 Encounter for screening mammogram for malignant neoplasm of breast: Secondary | ICD-10-CM

## 2014-03-10 ENCOUNTER — Encounter (INDEPENDENT_AMBULATORY_CARE_PROVIDER_SITE_OTHER): Payer: Self-pay

## 2014-03-10 ENCOUNTER — Ambulatory Visit
Admission: RE | Admit: 2014-03-10 | Discharge: 2014-03-10 | Disposition: A | Discharge status: Home / Self Care | Payer: Medicare Other | Source: Ambulatory Visit

## 2014-03-10 DIAGNOSIS — Z1231 Encounter for screening mammogram for malignant neoplasm of breast: Secondary | ICD-10-CM

## 2014-04-01 ENCOUNTER — Other Ambulatory Visit: Payer: Self-pay | Admitting: *Deleted

## 2014-04-02 MED ORDER — BUTALBITAL-APAP-CAFFEINE 50-325-40 MG PO TABS: 1.0000 | ORAL_TABLET | Freq: Two times a day (BID) | ORAL | Status: DC | PRN

## 2014-05-04 ENCOUNTER — Other Ambulatory Visit: Payer: Self-pay | Admitting: *Deleted

## 2014-05-04 MED ORDER — HYDROCHLOROTHIAZIDE 12.5 MG PO CAPS: 12.5000 mg | ORAL_CAPSULE | Freq: Every day | ORAL | Status: DC

## 2014-05-22 ENCOUNTER — Encounter: Payer: Self-pay | Admitting: Family Medicine

## 2014-05-22 ENCOUNTER — Ambulatory Visit (INDEPENDENT_AMBULATORY_CARE_PROVIDER_SITE_OTHER): Payer: Medicare Other | Admitting: Family Medicine

## 2014-05-22 VITALS — BP 128/59 | HR 74 | Temp 98.3°F | Ht 65.0 in | Wt 149.0 lb

## 2014-05-22 DIAGNOSIS — I10 Essential (primary) hypertension: Secondary | ICD-10-CM

## 2014-05-22 DIAGNOSIS — G43709 Chronic migraine without aura, not intractable, without status migrainosus: Secondary | ICD-10-CM

## 2014-05-22 DIAGNOSIS — G43909 Migraine, unspecified, not intractable, without status migrainosus: Secondary | ICD-10-CM

## 2014-05-22 DIAGNOSIS — Z0000 Encounter for general adult medical examination without abnormal findings: Secondary | ICD-10-CM

## 2014-05-22 DIAGNOSIS — E7800 Pure hypercholesterolemia, unspecified: Secondary | ICD-10-CM

## 2014-05-22 LAB — COMPREHENSIVE METABOLIC PANEL
ALT: 30 U/L (ref 0–35)
AST: 36 U/L (ref 0–37)
Albumin: 4.3 g/dL (ref 3.5–5.2)
Alkaline Phosphatase: 65 U/L (ref 39–117)
BILIRUBIN TOTAL: 0.4 mg/dL (ref 0.2–1.2)
BUN: 25 mg/dL — AB (ref 6–23)
CALCIUM: 9.2 mg/dL (ref 8.4–10.5)
CHLORIDE: 95 meq/L — AB (ref 96–112)
CO2: 29 meq/L (ref 19–32)
Creat: 1 mg/dL (ref 0.50–1.10)
Glucose, Bld: 81 mg/dL (ref 70–99)
Potassium: 3.6 mEq/L (ref 3.5–5.3)
Sodium: 135 mEq/L (ref 135–145)
Total Protein: 6.9 g/dL (ref 6.0–8.3)

## 2014-05-22 LAB — LIPID PANEL
Cholesterol: 191 mg/dL (ref 0–200)
HDL: 63 mg/dL (ref >39)
LDL Cholesterol: 103 mg/dL — ABNORMAL HIGH (ref 0–99)
Total CHOL/HDL Ratio: 3 Ratio
Triglycerides: 127 mg/dL (ref <150)
VLDL: 25 mg/dL (ref 0–40)

## 2014-05-22 LAB — MAGNESIUM: Magnesium: 2 mg/dL (ref 1.5–2.5)

## 2014-05-22 MED ORDER — ALPRAZOLAM 0.25 MG PO TABS: ORAL_TABLET | ORAL | Status: DC

## 2014-05-22 MED ORDER — ESOMEPRAZOLE MAGNESIUM 40 MG PO CPDR: 40.0000 mg | DELAYED_RELEASE_CAPSULE | Freq: Every day | ORAL | Status: DC

## 2014-05-22 MED ORDER — ESOMEPRAZOLE MAGNESIUM 40 MG PO CPDR: 40.0000 mg | DELAYED_RELEASE_CAPSULE | Freq: Every day | ORAL | Status: AC

## 2014-05-22 MED ORDER — BUTALBITAL-APAP-CAFFEINE 50-325-40 MG PO TABS: 1.0000 | ORAL_TABLET | Freq: Two times a day (BID) | ORAL | Status: DC | PRN

## 2014-05-22 NOTE — Assessment & Plan Note (Signed)
Up to date on Colonoscopy (2013), Mammogram (2015), and no longer requires Pap Smears at 73yo.  Checking CMET, Lipid Panel, and Mg level.  PHQ-9 (score=3) and GAD-7 (score=4) done today to follow Depression and Anxiety diagnoses. Refilled Xanax, Fioricet, and Nexium.

## 2014-05-22 NOTE — Assessment & Plan Note (Signed)
Continue to take Fioricet for pain.  MRI ordered.  Referral placed to neurology due to concerning change in symptoms and pain awakening patient at night.

## 2014-05-22 NOTE — Progress Notes (Signed)
Subjective:     Patient ID: Melissa Gonzales, female   DOB: [birth year], 73 y.o.   MRN: [id number]  HPI CC:  Annual Exam Melissa Gonzales is a 73yo female with a history of HTN, hypercholesterolemia, anxiety, and depression presenting today for her annual check-up.  We reviewed her past medical history and made notes of changes, such as the addition of glaucoma to her PMH, current list of medications, cataract surgery bilaterally 5years ago, and family history of primary cell liver cancer in her father.    Acute complaints today includes change in headaches.  She has a history of migraines, usually controlled with Fioricet.  The pain will last 4-5 days in a row, while she used to only have one every few weeks.  The pain is sharp and located on the Right side of the head and face.  Pain is 6-7/10 in severity.  She thought it might be due to sinuses and tried Claritin with no relief.  Last headache was day before yesterday (05/20/14).  They usually occur at night, which is a change from her baseline, and even wake her up from sleep. She is worried and would like a referral to neurology to rule out a tumor.  She states that she is doing well in terms of her anxiety and depression, however she has had some increased stress in her life lately concerning her daughter and granddaughter.  She states that her daughter is a Health and safety inspector and sexually promiscuous and that she is worried her granddaughter, who is 74, will have a bad influence in her life.  Her granddaughter has been "homeschooled" by her daughter for the past three years, but is about to start attending public school.  She is worried that her granddaughter will not be at the grade level expected and will be embarrassed.  Melissa Gonzales is seeing a therapist every two weeks and she believes this helps.   Review of Systems  HENT: Negative for sinus pressure.   Eyes: Negative for photophobia and visual disturbance.  Gastrointestinal: Negative for nausea and  vomiting.  Musculoskeletal: Positive for back pain.  Neurological: Positive for headaches. Negative for numbness.  All other systems reviewed and are negative.      Objective:   Physical Exam  Constitutional: She is oriented to person, place, and time. She appears well-developed and well-nourished. No distress.  HENT:  Head: Normocephalic and atraumatic.  Right Ear: External ear normal.  Left Ear: External ear normal.  Nose: Nose normal.  Mouth/Throat: Oropharynx is clear and moist.  Eyes: Conjunctivae and EOM are normal. Pupils are equal, round, and reactive to light. Right eye exhibits no discharge. Left eye exhibits no discharge. No scleral icterus.  Cardiovascular: Normal rate, regular rhythm and normal heart sounds.  Exam reveals no gallop and no friction rub.   No murmur heard. Pulmonary/Chest: Effort normal and breath sounds normal. No respiratory distress. She has no wheezes. She has no rales.  Abdominal: Soft. Bowel sounds are normal. She exhibits no distension and no mass. There is no tenderness.  Neurological: She is alert and oriented to person, place, and time. No cranial nerve deficit. Coordination normal.  Skin: Skin is warm and dry. No rash noted.  Psychiatric: She has a normal mood and affect. Her behavior is normal. Judgment and thought content normal.   Filed Vitals:   05/22/14 1452  BP: 128/59  Pulse: 74  Temp: 98.3 F (36.8 C)   PHQ-9= 3 GAD-7=4      Assessment:  Please see Problem List for Assessment.    Plan:     Please see Problem List for Plan.

## 2014-05-22 NOTE — Patient Instructions (Signed)
Thank you so much for coming to visit today!  Since this is your yearly visit, we are going to get some labwork today!  You are up to date on your immunizations, colonoscopy, mammogram, and pap smears!    I am sorry about the headaches!  I am putting in a referral to neurology and scheduling you for an MRI so we can check things out!  Please continue using the Fioricet for the pain.    Thanks again,  [facility]

## 2014-05-24 ENCOUNTER — Encounter: Payer: Self-pay | Admitting: Family Medicine

## 2014-05-25 NOTE — Progress Notes (Signed)
[facility] ATTENDING  NOTE Osiris Charles EniolaNicolette Bang,MD I have discussed this patient with the resident. I agree with the resident's findings, assessment and care plan. As per Dr Caroleen Hammanumley patient requested Neurology referral for her chronic HA which now wakes her up at night. I agree with MRI testing to R/O intracranial mass.

## 2014-05-27 ENCOUNTER — Encounter: Payer: Self-pay | Admitting: Family Medicine

## 2014-05-27 ENCOUNTER — Ambulatory Visit
Admission: RE | Admit: 2014-05-27 | Discharge: 2014-05-27 | Disposition: A | Discharge status: Home / Self Care | Payer: Medicare Other | Source: Ambulatory Visit | Attending: Family Medicine | Admitting: Family Medicine

## 2014-05-27 DIAGNOSIS — G43709 Chronic migraine without aura, not intractable, without status migrainosus: Secondary | ICD-10-CM

## 2014-05-27 MED ORDER — GADOBENATE DIMEGLUMINE 529 MG/ML IV SOLN
13.0000 mL | Freq: Once | INTRAVENOUS | Status: AC | PRN
  Administered 2014-05-27: 13 mL via INTRAVENOUS

## 2014-06-02 NOTE — Assessment & Plan Note (Signed)
Lipid panel done today. Currently taking atorvastatin.

## 2014-06-02 NOTE — Assessment & Plan Note (Signed)
Lipid panel, CMET, and Mg done today due to current HTN medication regimen.  Blood pressure currently well controlled at 128/59 on current medications.

## 2014-06-02 NOTE — Progress Notes (Signed)
Patient ID: Melissa Gonzales, female   DOB: [birth year], 73 y.o.   MRN: [id number] Agree with Dr. Richarda Overlieumley's addendum.

## 2014-06-05 ENCOUNTER — Other Ambulatory Visit: Payer: Self-pay | Admitting: Family Medicine

## 2014-06-12 ENCOUNTER — Other Ambulatory Visit: Payer: Self-pay | Admitting: Family Medicine

## 2014-06-12 NOTE — Telephone Encounter (Signed)
Please let her know that a prescription is waiting for her at the front desk! It had already been written and waiting at the front desk, so the refill request for this encounter was denied.Thanks!

## 2014-06-26 ENCOUNTER — Other Ambulatory Visit: Payer: Self-pay | Admitting: *Deleted

## 2014-06-26 MED ORDER — ATORVASTATIN CALCIUM 40 MG PO TABS: ORAL_TABLET | ORAL | Status: DC

## 2014-07-09 ENCOUNTER — Encounter: Payer: Self-pay | Admitting: Family Medicine

## 2014-07-09 ENCOUNTER — Ambulatory Visit (INDEPENDENT_AMBULATORY_CARE_PROVIDER_SITE_OTHER): Payer: Medicare Other | Admitting: Family Medicine

## 2014-07-09 VITALS — BP 134/72 | HR 79 | Temp 98.8°F | Ht 65.0 in | Wt 150.0 lb

## 2014-07-09 DIAGNOSIS — M79622 Pain in left upper arm: Secondary | ICD-10-CM

## 2014-07-09 DIAGNOSIS — M79609 Pain in unspecified limb: Secondary | ICD-10-CM

## 2014-07-09 NOTE — Progress Notes (Signed)
    Subjective   Melissa Gonzales is a 73 y.o. female that presents for a same day visit  1. Lump under left arm: Felt it yesterday. Has had pain there for about 3 weeks. Breasts are not painful. No discharge. No history of breast cancer, masses or family history of breast cancer. Does not shave armpits (last a year ago). No skin changes. No fever or chills. No weight loss. Last had a mammogram this year and normal.  History  Substance Use Topics  . Smoking status: Never Smoker   . Smokeless tobacco: Not on file  . Alcohol Use: 0.6 oz/week    1 Glasses of wine per week     Comment: 1 glass per night    ROS Per HPI  Objective   BP 134/72  Pulse 79  Temp(Src) 98.8 F (37.1 C) (Oral)  Ht  (1.651 m)  Wt 150 lb (68.04 kg)  BMI 24.96 kg/m2  General: Well appearing, no acute distress Respiratory/Chest: Breasts equal and normal in appearance. No discrete masses felt on exam. No tenderness. Extremities: Tenderness at left axilla. No discrete mass felt  Assessment and Plan   Please refer to problem based charting of assessment and plan

## 2014-07-09 NOTE — Patient Instructions (Addendum)
Thank you for coming to see me today. It was a pleasure. Today we talked about:   Armpit lump: This does not appear to be a lymph node. Sometimes sweat glands get inflamed. Usually symptoms improve by a week. For your pain, consider taking Tylenol  -  every 8 hours as needed. Please make an appointment to be seen in one week for follow-up, unless symptoms improve.  If you have any questions or concerns, please do not hesitate to call the office at [phone].  Sincerely,  Jacquelin Hawking, MD

## 2014-07-12 DIAGNOSIS — M79629 Pain in unspecified upper arm: Secondary | ICD-10-CM | POA: Insufficient documentation

## 2014-07-12 NOTE — Assessment & Plan Note (Signed)
Do not suspect mass. Did not feel axillary lymph nodes. Reassurance discussed with follow-up next week if symptoms worsen or fail to improve.

## 2014-07-20 ENCOUNTER — Ambulatory Visit: Payer: Medicare Other | Admitting: Family Medicine

## 2014-08-19 ENCOUNTER — Other Ambulatory Visit: Payer: Self-pay | Admitting: Family Medicine

## 2014-08-26 ENCOUNTER — Encounter: Payer: Self-pay | Admitting: Family Medicine

## 2014-08-26 ENCOUNTER — Ambulatory Visit (INDEPENDENT_AMBULATORY_CARE_PROVIDER_SITE_OTHER): Payer: Medicare Other | Admitting: Family Medicine

## 2014-08-26 VITALS — BP 145/77 | HR 78 | Ht 65.0 in | Wt 147.0 lb

## 2014-08-26 DIAGNOSIS — M25511 Pain in right shoulder: Secondary | ICD-10-CM

## 2014-08-26 DIAGNOSIS — M5441 Lumbago with sciatica, right side: Secondary | ICD-10-CM

## 2014-08-26 MED ORDER — HYDROCODONE-ACETAMINOPHEN 5-325 MG PO TABS: 1.0000 | ORAL_TABLET | Freq: Four times a day (QID) | ORAL | Status: DC | PRN

## 2014-08-26 MED ORDER — GABAPENTIN 300 MG PO CAPS: 300.0000 mg | ORAL_CAPSULE | Freq: Three times a day (TID) | ORAL | Status: DC

## 2014-08-26 NOTE — Patient Instructions (Signed)
Your pain is due to spinal stenosis. Start gabapentin - 300mg  at bedtime for 3 days then twice a day for 3 days and finally 3 times a day. Take norco as needed for severe pain. Heat as needed for spasms. Consider taking aleve 2 tabs twice a day with food OR ibuprofen 600mg  three times a day with food for pain and inflammation. Continue with your physical therapy. If not improving over next week or two give me a call. At that point we could go up on the gabapentin or again discuss the cortisone injections. Otherwise follow-up with me in 1 month.  Your shoulder pain is due to rotator cuff strain and contusion. Start home exercise program 3 sets of 10 once a day when tolerated. Icing 15 minutes at a time as needed.

## 2014-09-02 ENCOUNTER — Ambulatory Visit: Payer: Medicare Other | Admitting: Sports Medicine

## 2014-09-02 ENCOUNTER — Encounter: Payer: Self-pay | Admitting: Family Medicine

## 2014-09-02 DIAGNOSIS — M25511 Pain in right shoulder: Secondary | ICD-10-CM | POA: Insufficient documentation

## 2014-09-02 DIAGNOSIS — M545 Low back pain, unspecified: Secondary | ICD-10-CM | POA: Insufficient documentation

## 2014-09-02 DIAGNOSIS — M25512 Pain in left shoulder: Secondary | ICD-10-CM

## 2014-09-02 NOTE — Assessment & Plan Note (Signed)
2/2 fall causing rotator cuff strain and contusion.  Home exercise program reviewed today.  Icing as needed.

## 2014-09-02 NOTE — Progress Notes (Signed)
Patient ID: Melissa Gonzales, female   DOB: 1941/07/06, 73 y.o.   MRN: [id number]  PCP: Araceli Boucheumley, [place], DO  Subjective:   HPI: Patient is a 73 y.o. female here for back/right leg pain.  Patient reports she has known history of severe spinal stenosis. Had MRI back in July 2013 which confirmed this. She states 2 years ago she did physical therapy and pilates which helped. Also did dry needling and pain resolved. Since this spring pain has been slowly coming back in same area. Radiates into right more than left leg. Right leg gave out and she fell onto right shoulder recently. No bowel/bladder dysfunction.  Past Medical History  Diagnosis Date  . Anxiety   . GERD (gastroesophageal reflux disease)   . Hyperlipidemia   . Hypertension   . Depression   . Glaucoma     Current Outpatient Prescriptions on File Prior to Visit  Medication Sig Dispense Refill  . ALPHAGAN P 0.1 % SOLN       . ALPRAZolam (XANAX) 0.25 MG tablet take 1 tablet by mouth three times a day if needed for sleep  30 tablet  3  . atorvastatin (LIPITOR) 40 MG tablet take 1 tablet by mouth once daily  90 tablet  3  . BOOSTRIX 5-2.5-18.5 injection       . butalbital-acetaminophen-caffeine (FIORICET, ESGIC) 50-325-40 MG per tablet TAKE 1 TABLET BY MOUTH TWICE DAILY AS NEEDED FOR HEADACHE  30 tablet  0  . conjugated estrogens (PREMARIN) vaginal cream Place vaginally daily.  42.5 g  12  . dorzolamide-timolol (COSOPT) 22.3-6.8 MG/ML ophthalmic solution       . esomeprazole (NEXIUM) 40 MG capsule Take 1 capsule (40 mg total) by mouth daily before breakfast.  90 capsule  3  . GAVILYTE-N WITH FLAVOR PACK 420 G solution       . hydrochlorothiazide (MICROZIDE) 12.5 MG capsule Take 1 capsule (12.5 mg total) by mouth daily.  90 capsule  3  . Ketoprofen POWD Ketoprofen gel 20% aaa tid  60 g  12  . lisinopril (PRINIVIL,ZESTRIL) 20 MG tablet Take 1 tablet (20 mg total) by mouth daily.  90 tablet  3  . loratadine (CLARITIN) 10 MG  tablet Take 10 mg by mouth daily.        . meloxicam (MOBIC) 15 MG tablet Take one pill daily with food for 7 days then prn  40 tablet  0  . oxybutynin (DITROPAN-XL) 10 MG 24 hr tablet Take 1 tablet (10 mg total) by mouth daily.  30 tablet  6  . sertraline (ZOLOFT) 100 MG tablet Take 1 tablet (100 mg total) by mouth daily.  90 tablet  3   No current facility-administered medications on file prior to visit.    Past Surgical History  Procedure Laterality Date  . Cholecystectomy  1993  . Vaginal hysterectomy  1970s    uterine prolapse  . Eye surgery      Cataract Surgery Bilateral, 5 years ago    No Known Allergies  History   Social History  . Marital Status: Widowed    Spouse Name: N/A    Number of Children: 3  . Years of Education: N/A   Occupational History  .     Social History Main Topics  . Smoking status: Never Smoker   . Smokeless tobacco: Not on file  . Alcohol Use: 0.6 oz/week    1 Glasses of wine per week     Comment: 1 glass per night  .  Drug Use: No  . Sexual Activity: No   Other Topics Concern  . Not on file   Social History Narrative   Husband died suicide age 8740s.  Currently in long term romantic relationship.  Support from 2 daughters and good relationship with grandchildren.  Other daughter has mental health issues, strained relationship.  Pt feels she is not making good decisions re: children and has tried to get CPS involved without relief.   1/3/13Lucila Maine: Grandson (of daughter with mental health issues) facing jail time for selling drugs.  Pt has tried to support him and offered help with rehab or with changing colleges, but he refuses.      Family History  Problem Relation Age of Onset  . Mental illness Daughter   . Drug abuse Grandchild   . Heart disease Mother 286    died MI age 74  . Hyperlipidemia Sister   . Hypertension Sister   . Diabetes Neg Hx   . Cancer Father     Primary Cell Liver Cancer    BP 145/77  Pulse 78  Ht 5\' 5"  (1.651 m)   Wt 147 lb (66.679 kg)  BMI 24.46 kg/m2  Review of Systems: See HPI above.    Objective:  Physical Exam:  Gen: NAD  Back: No gross deformity, scoliosis. TTP mildly right lumbar paraspinal region.  No midline or bony TTP. FROM with pain on extension > flexion. Strength LEs 5/5 all muscle groups.   2+ MSRs in patellar and achilles tendons, equal bilaterally. Negative SLRs. Sensation intact to light touch bilaterally. Negative logroll bilateral hips  Right shoulder: No swelling, ecchymoses.  No gross deformity. TTP lateral shoulder. FROM with mild painful arc. Negative Hawkins, Neers. Negative Speeds, Yergasons. Strength 5/5 with empty can and resisted internal/external rotation.  Pain empty can > ER. Negative apprehension. NV intact distally.    Assessment & Plan:  1. Low back pain - with radiation into right lower extremity.  Consistent with known spinal stenosis (confirmed by MRI 2 years ago).  We discussed treatment options.  She is currently doing physical therapy.  She would like to try gabapentin - will take at bedtime and titrate up to tid.  Norco as needed for severe pain.  Heat for spasms.  Consider nsaids, repeating MRI, ESIs.  F/u in 1 month but instructed to call me sooner if having worse pain.  2. Right shoulder pain - 2/2 fall causing rotator cuff strain and contusion.  Home exercise program reviewed today.  Icing as needed.

## 2014-09-02 NOTE — Assessment & Plan Note (Signed)
with radiation into right lower extremity.  Consistent with known spinal stenosis (confirmed by MRI 2 years ago).  We discussed treatment options.  She is currently doing physical therapy.  She would like to try gabapentin - will take at bedtime and titrate up to tid.  Norco as needed for severe pain.  Heat for spasms.  Consider nsaids, repeating MRI, ESIs.  F/u in 1 month but instructed to call me sooner if having worse pain.

## 2014-09-14 ENCOUNTER — Encounter: Payer: Self-pay | Admitting: Family Medicine

## 2014-09-14 ENCOUNTER — Ambulatory Visit (INDEPENDENT_AMBULATORY_CARE_PROVIDER_SITE_OTHER): Payer: Medicare Other | Admitting: Family Medicine

## 2014-09-14 VITALS — BP 151/79 | HR 87 | Ht 65.0 in | Wt 148.0 lb

## 2014-09-14 DIAGNOSIS — M48061 Spinal stenosis, lumbar region without neurogenic claudication: Secondary | ICD-10-CM

## 2014-09-14 DIAGNOSIS — M5441 Lumbago with sciatica, right side: Secondary | ICD-10-CM

## 2014-09-14 DIAGNOSIS — M4806 Spinal stenosis, lumbar region: Secondary | ICD-10-CM

## 2014-09-14 DIAGNOSIS — M25511 Pain in right shoulder: Secondary | ICD-10-CM

## 2014-09-14 MED ORDER — HYDROCODONE-ACETAMINOPHEN 5-325 MG PO TABS: 1.0000 | ORAL_TABLET | Freq: Four times a day (QID) | ORAL | Status: DC | PRN

## 2014-09-14 MED ORDER — GABAPENTIN 300 MG PO CAPS: ORAL_CAPSULE | ORAL | Status: DC

## 2014-09-14 NOTE — Patient Instructions (Signed)
We will arrange for an epidural injection for your low back. Increase gabapentin to 300mg  in morning, 600mg  afternoon, 600mg  in evening. Norco as needed for severe pain. Call us a week after the shot to let us know how you're doing. A neurosurgeon referral is a consideration.

## 2014-09-16 ENCOUNTER — Other Ambulatory Visit: Payer: Self-pay | Admitting: Family Medicine

## 2014-09-16 DIAGNOSIS — M48061 Spinal stenosis, lumbar region without neurogenic claudication: Secondary | ICD-10-CM

## 2014-09-16 NOTE — Assessment & Plan Note (Signed)
with radiation into right lower extremity.  Consistent with known spinal stenosis (confirmed by MRI 2 years ago).  Tolerating gabapentin but only minimal benefit.  Will increase to 300 qam, 600 qpm and at bedtime - makes sleepy with morning dose so will keep this at 300.  Norco as needed for severe pain.  Heat for spasms.  Will trial ESIs at L4-5 - if has incomplete relief can repeat these for total of 3 injections.  Neurosurgery referral another consideration.

## 2014-09-16 NOTE — Assessment & Plan Note (Signed)
2/2 fall causing rotator cuff strain and contusion.  Much improved.  Continue HEP for another 3-4 weeks.

## 2014-09-16 NOTE — Progress Notes (Signed)
Patient ID: Melissa Gonzales, female   DOB: 06/27/1941, 73 y.o.   MRN: [id number]  PCP: Araceli Boucheumley, [place] N, DO  Subjective:   HPI: Patient is a 73 y.o. female here for back/right leg pain.  10/21: Patient reports she has known history of severe spinal stenosis. Had MRI back in July 2013 which confirmed this. She states 2 years ago she did physical therapy and pilates which helped. Also did dry needling and pain resolved. Since this spring pain has been slowly coming back in same area. Radiates into right more than left leg. Right leg gave out and she fell onto right shoulder recently. No bowel/bladder dysfunction.  11/10: Patient reports her right shoulder has improved about 60% - pain with some movements but overall doing better. Using heat, taking norco as needed but more for her back. Doing home exercises. Low back pain still severe at 7/10 level. Taking gabapentin, norco. Doing physical therapy and home exercises. No bowel/bladder dysfunction. Still going into both legs.  Past Medical History  Diagnosis Date  . Anxiety   . GERD (gastroesophageal reflux disease)   . Hyperlipidemia   . Hypertension   . Depression   . Glaucoma     Current Outpatient Prescriptions on File Prior to Visit  Medication Sig Dispense Refill  . ALPHAGAN P 0.1 % SOLN     . ALPRAZolam (XANAX) 0.25 MG tablet take 1 tablet by mouth three times a day if needed for sleep 30 tablet 3  . atorvastatin (LIPITOR) 40 MG tablet take 1 tablet by mouth once daily 90 tablet 3  . BOOSTRIX 5-2.5-18.5 injection     . butalbital-acetaminophen-caffeine (FIORICET, ESGIC) 50-325-40 MG per tablet TAKE 1 TABLET BY MOUTH TWICE DAILY AS NEEDED FOR HEADACHE 30 tablet 0  . conjugated estrogens (PREMARIN) vaginal cream Place vaginally daily. 42.5 g 12  . dorzolamide-timolol (COSOPT) 22.3-6.8 MG/ML ophthalmic solution     . esomeprazole (NEXIUM) 40 MG capsule Take 1 capsule (40 mg total) by mouth daily before breakfast. 90  capsule 3  . GAVILYTE-N WITH FLAVOR PACK 420 G solution     . hydrochlorothiazide (MICROZIDE) 12.5 MG capsule Take 1 capsule (12.5 mg total) by mouth daily. 90 capsule 3  . Ketoprofen POWD Ketoprofen gel 20% aaa tid 60 g 12  . latanoprost (XALATAN) 0.005 % ophthalmic solution     . lisinopril (PRINIVIL,ZESTRIL) 20 MG tablet Take 1 tablet (20 mg total) by mouth daily. 90 tablet 3  . loratadine (CLARITIN) 10 MG tablet Take 10 mg by mouth daily.      . meloxicam (MOBIC) 15 MG tablet Take one pill daily with food for 7 days then prn 40 tablet 0  . oxybutynin (DITROPAN-XL) 10 MG 24 hr tablet Take 1 tablet (10 mg total) by mouth daily. 30 tablet 6  . sertraline (ZOLOFT) 100 MG tablet Take 1 tablet (100 mg total) by mouth daily. 90 tablet 3   No current facility-administered medications on file prior to visit.    Past Surgical History  Procedure Laterality Date  . Cholecystectomy  1993  . Vaginal hysterectomy  1970s    uterine prolapse  . Eye surgery      Cataract Surgery Bilateral, 5 years ago    No Known Allergies  History   Social History  . Marital Status: Widowed    Spouse Name: N/A    Number of Children: 3  . Years of Education: N/A   Occupational History  .     Social History Main  Topics  . Smoking status: Never Smoker   . Smokeless tobacco: Not on file  . Alcohol Use: 0.6 oz/week    1 Glasses of wine per week     Comment: 1 glass per night  . Drug Use: No  . Sexual Activity: No   Other Topics Concern  . Not on file   Social History Narrative   Husband died suicide age 4340s.  Currently in long term romantic relationship.  Support from 2 daughters and good relationship with grandchildren.  Other daughter has mental health issues, strained relationship.  Pt feels she is not making good decisions re: children and has tried to get CPS involved without relief.   1/3/13Lucila Maine: Grandson (of daughter with mental health issues) facing jail time for selling drugs.  Pt has tried to  support him and offered help with rehab or with changing colleges, but he refuses.      Family History  Problem Relation Age of Onset  . Mental illness Daughter   . Drug abuse Grandchild   . Heart disease Mother [id number]    died MI age 74  . Hyperlipidemia Sister   . Hypertension Sister   . Diabetes Neg Hx   . Cancer Father     Primary Cell Liver Cancer    BP 151/79 mmHg  Pulse 87  Ht 5\' 5"  (1.651 m)  Wt 148 lb (67.132 kg)  BMI 24.63 kg/m2  Review of Systems: See HPI above.    Objective:  Physical Exam:  Gen: NAD  Back: No gross deformity, scoliosis. TTP mildly right lumbar paraspinal region.  No midline or bony TTP. FROM with pain on extension only. Strength LEs 5/5 all muscle groups.   2+ MSRs in patellar and achilles tendons, equal bilaterally. Negative SLRs. Sensation intact to light touch bilaterally. Negative logroll bilateral hips  Right shoulder: No swelling, ecchymoses.  No gross deformity. No TTP lateral shoulder. FROM without painful arc. Negative Hawkins, Neers. Negative Speeds, Yergasons. Strength 5/5 with empty can and resisted internal/external rotation.  No pain. Negative apprehension. NV intact distally.    Assessment & Plan:  1. Low back pain - with radiation into right lower extremity.  Consistent with known spinal stenosis (confirmed by MRI 2 years ago).  Tolerating gabapentin but only minimal benefit.  Will increase to 300 qam, 600 qpm and at bedtime - makes sleepy with morning dose so will keep this at 300.  Norco as needed for severe pain.  Heat for spasms.  Will trial ESIs at L4-5 - if has incomplete relief can repeat these for total of 3 injections.  Neurosurgery referral another consideration.  2. Right shoulder pain - 2/2 fall causing rotator cuff strain and contusion.  Much improved.  Continue HEP for another 3-4 weeks.

## 2014-09-17 ENCOUNTER — Ambulatory Visit
Admission: RE | Admit: 2014-09-17 | Discharge: 2014-09-17 | Disposition: A | Discharge status: Home / Self Care | Payer: Medicare Other | Source: Ambulatory Visit | Attending: Family Medicine | Admitting: Family Medicine

## 2014-09-17 DIAGNOSIS — M48061 Spinal stenosis, lumbar region without neurogenic claudication: Secondary | ICD-10-CM

## 2014-09-17 MED ORDER — METHYLPREDNISOLONE ACETATE 40 MG/ML INJ SUSP (RADIOLOG
120.0000 mg | Freq: Once | INTRAMUSCULAR | Status: AC
  Administered 2014-09-17: 120 mg via EPIDURAL

## 2014-09-17 MED ORDER — IOHEXOL 180 MG/ML  SOLN
1.0000 mL | Freq: Once | INTRAMUSCULAR | Status: AC | PRN
Start: 2014-09-17 — End: 2014-09-17
  Administered 2014-09-17: 1 mL via EPIDURAL

## 2014-09-17 NOTE — Discharge Instructions (Signed)

## 2014-09-21 ENCOUNTER — Telehealth: Payer: Self-pay | Admitting: Radiology

## 2014-09-21 ENCOUNTER — Telehealth: Payer: Self-pay | Admitting: Interventional Radiology

## 2014-09-21 ENCOUNTER — Telehealth: Payer: Self-pay | Admitting: Family Medicine

## 2014-09-21 ENCOUNTER — Telehealth: Payer: Self-pay | Admitting: *Deleted

## 2014-09-21 NOTE — Telephone Encounter (Signed)
Ok to put in a referral.  Did the Radiologists talk to her today about the issues she had following the epidural?

## 2014-09-21 NOTE — Telephone Encounter (Signed)
Spoke to patient and she stated that she has a call in to [facility]. (pw)

## 2014-09-21 NOTE — Telephone Encounter (Signed)
I'm not sure what the radiologists' protocol is but has she tried calling them first to notify them?  They may want to see and evaluate her since they gave the injection to make sure she's not had some kind of adverse reaction to it.

## 2014-09-21 NOTE — Progress Notes (Signed)
Returned pt's call. C/o L calf pain and spasm fairly constantly since Friday. Had lumbar epidural steroid injection L4-5 Right on Thursday. Pain 7-8/10, relieved by vicodin and alleve 3/10. No fever, bowel/bladder issues. Right Leg symptoms improving. No new back pain.  Advised pt that LE cramping is a know and common self limited side effect of ESI. Doubt epidural hematoma/abscess given limited sx. Doubt DVT given temporal association.  Offered to have her come in and be seen. Anticipate resolution with conservative measures. Heat/cold as tolerated, stretching. Walking ok. Pt has used dry needling in the past which would not be contraindicated. Will have our RN call in 2 days to confirm improvement. Pt knows to call should sx worsen.

## 2014-09-21 NOTE — Telephone Encounter (Signed)
Patient called and said that she would like to be referred to Neurosurgery.

## 2014-09-21 NOTE — Telephone Encounter (Signed)
Pt had a bilaterally NRB's done last Thursday.  She is have numbness and weakness in her leg post injection. Info given to Dr. Deanne CofferHassell to call and talk to pt.

## 2014-09-22 NOTE — Addendum Note (Signed)
Addended by: Kathi SimpersWISE, Aengus Sauceda F on: 09/22/2014 04:35 PM   Modules accepted: Orders

## 2014-09-23 ENCOUNTER — Telehealth: Payer: Self-pay

## 2014-09-23 ENCOUNTER — Encounter: Payer: Self-pay | Admitting: *Deleted

## 2014-09-23 NOTE — Telephone Encounter (Signed)
Patient called in follow-up to earlier call after L4-5 ESI #1 on 09/17/14.  She reports now that her left foot, which had been completely numb, is just "a little numb."  Her left calf had been "hard as a rock" until her physical therapist did some dry needling on it yesterday.  "It's not as hard now and the swelling is down."  She will be out of town next week when Dr. Alfredo BattyMattern returns to [facility], so she declined a follow-up appointment with him.  She states she will follow up as needed with Dr. Pearletha ForgeHudnall.  I wished her a great time visiting cousins in [place] next week over Thanksgiving and told her we are here if she needs us when she returns.  Donell SievertJeanne Blayn Whetsell, RN

## 2014-09-24 ENCOUNTER — Ambulatory Visit: Payer: Medicare Other | Admitting: Family Medicine

## 2014-10-11 ENCOUNTER — Other Ambulatory Visit: Payer: Self-pay | Admitting: Family Medicine

## 2014-11-03 ENCOUNTER — Other Ambulatory Visit: Payer: Self-pay | Admitting: Neurological Surgery

## 2014-11-10 ENCOUNTER — Other Ambulatory Visit: Payer: Self-pay | Admitting: Family Medicine

## 2014-11-17 ENCOUNTER — Other Ambulatory Visit: Payer: Self-pay | Admitting: Family Medicine

## 2014-11-18 ENCOUNTER — Other Ambulatory Visit (HOSPITAL_COMMUNITY): Payer: Self-pay | Admitting: *Deleted

## 2014-11-18 NOTE — Pre-Procedure Instructions (Signed)
Melissa Gonzales  11/18/2014   Your procedure is scheduled on:  Friday, November 27, 2014 at 9:30 AM.   [facility]" Admitting Office at 7:30 AM.   Call this number if you have problems the morning of surgery: [phone]   Remember:   Do not eat food or drink liquids after midnight Thursday, 11/26/14.   Take these medicines the morning of surgery with A SIP OF WATER: Nexium (Esomeprazole), Neurontin (Gabapentin), Loratadine (Claritin), Sertraline (Zoloft), you may use eye drops, Hydrocodone - if needed  Stop Vitamins, Fish Oil, and NSAIDs (Mobic (meloxicam), Naprosyn, Aleve, Ibuprofen, etc) as of tomorrow.   Do not wear jewelry, make-up or nail polish.  Do not wear lotions, powders, or perfumes. You may wear deodorant.  Do not shave 48 hours prior to surgery.   Do not bring valuables to the hospital.  [facility] is not responsible                  for any belongings or valuables.               Contacts, dentures or bridgework may not be worn into surgery.  Leave suitcase in the car. After surgery it may be brought to your room.  For patients admitted to the hospital, discharge time is determined by your                treatment team.               Special Instructions: See "Preparing for Surgery" Instruction Sheet   Please read over the following fact sheets that you were given: Pain Booklet, Coughing and Deep Breathing, Blood Transfusion Information, MRSA Information and Surgical Site Infection Prevention

## 2014-11-19 ENCOUNTER — Encounter (HOSPITAL_COMMUNITY): Payer: Self-pay

## 2014-11-19 ENCOUNTER — Encounter (HOSPITAL_COMMUNITY)
Admission: RE | Admit: 2014-11-19 | Discharge: 2014-11-19 | Disposition: A | Discharge status: Home / Self Care | Payer: Medicare Other | Source: Ambulatory Visit | Attending: Neurological Surgery | Admitting: Neurological Surgery

## 2014-11-19 DIAGNOSIS — M4310 Spondylolisthesis, site unspecified: Secondary | ICD-10-CM | POA: Insufficient documentation

## 2014-11-19 DIAGNOSIS — Z01812 Encounter for preprocedural laboratory examination: Secondary | ICD-10-CM | POA: Diagnosis not present

## 2014-11-19 DIAGNOSIS — Z01810 Encounter for preprocedural cardiovascular examination: Secondary | ICD-10-CM | POA: Insufficient documentation

## 2014-11-19 DIAGNOSIS — Z01818 Encounter for other preprocedural examination: Secondary | ICD-10-CM | POA: Insufficient documentation

## 2014-11-19 LAB — SURGICAL PCR SCREEN
MRSA, PCR: NEGATIVE
Staphylococcus aureus: POSITIVE — AB

## 2014-11-19 LAB — ABO/RH: ABO/RH(D): A POS

## 2014-11-19 LAB — TYPE AND SCREEN
ABO/RH(D): A POS
Antibody Screen: NEGATIVE

## 2014-11-19 LAB — BASIC METABOLIC PANEL
ANION GAP: 8 (ref 5–15)
BUN: 19 mg/dL (ref 6–23)
CHLORIDE: 105 meq/L (ref 96–112)
CO2: 26 mmol/L (ref 19–32)
CREATININE: 0.93 mg/dL (ref 0.50–1.10)
Calcium: 9.6 mg/dL (ref 8.4–10.5)
GFR calc non Af Amer: 59 mL/min — ABNORMAL LOW (ref >90)
GFR, EST AFRICAN AMERICAN: 68 mL/min — AB (ref >90)
Glucose, Bld: 90 mg/dL (ref 70–99)
Potassium: 4.2 mmol/L (ref 3.5–5.1)
Sodium: 139 mmol/L (ref 135–145)

## 2014-11-19 LAB — CBC
HCT: 40.7 % (ref 36.0–46.0)
Hemoglobin: 14.1 g/dL (ref 12.0–15.0)
MCH: 34.1 pg — ABNORMAL HIGH (ref 26.0–34.0)
MCHC: 34.6 g/dL (ref 30.0–36.0)
MCV: 98.5 fL (ref 78.0–100.0)
PLATELETS: 190 10*3/uL (ref 150–400)
RBC: 4.13 MIL/uL (ref 3.87–5.11)
RDW: 12.7 % (ref 11.5–15.5)
WBC: 4.4 10*3/uL (ref 4.0–10.5)

## 2014-11-26 MED ORDER — CEFAZOLIN SODIUM-DEXTROSE 2-3 GM-% IV SOLR
2.0000 g | INTRAVENOUS | Status: AC
  Administered 2014-11-27: 2 g via INTRAVENOUS
  Filled 2014-11-26: qty 50

## 2014-11-27 ENCOUNTER — Inpatient Hospital Stay (HOSPITAL_COMMUNITY): Payer: Medicare Other

## 2014-11-27 ENCOUNTER — Inpatient Hospital Stay (HOSPITAL_COMMUNITY): Payer: Medicare Other | Admitting: Anesthesiology

## 2014-11-27 ENCOUNTER — Inpatient Hospital Stay (HOSPITAL_COMMUNITY)
Admission: RE | Admit: 2014-11-27 | Discharge: 2014-11-28 | DRG: 460 | Disposition: A | Discharge status: Home / Self Care | Payer: Medicare Other | Source: Ambulatory Visit | Attending: Neurological Surgery | Admitting: Neurological Surgery

## 2014-11-27 ENCOUNTER — Encounter (HOSPITAL_COMMUNITY)
Admission: RE | Disposition: A | Discharge status: Home / Self Care | Payer: Medicare Other | Source: Ambulatory Visit | Attending: Neurological Surgery

## 2014-11-27 ENCOUNTER — Encounter (HOSPITAL_COMMUNITY): Payer: Self-pay | Admitting: Anesthesiology

## 2014-11-27 DIAGNOSIS — M4316 Spondylolisthesis, lumbar region: Principal | ICD-10-CM | POA: Diagnosis present

## 2014-11-27 DIAGNOSIS — F329 Major depressive disorder, single episode, unspecified: Secondary | ICD-10-CM | POA: Diagnosis not present

## 2014-11-27 DIAGNOSIS — M4326 Fusion of spine, lumbar region: Secondary | ICD-10-CM | POA: Diagnosis not present

## 2014-11-27 DIAGNOSIS — Z79899 Other long term (current) drug therapy: Secondary | ICD-10-CM

## 2014-11-27 DIAGNOSIS — K219 Gastro-esophageal reflux disease without esophagitis: Secondary | ICD-10-CM | POA: Diagnosis not present

## 2014-11-27 DIAGNOSIS — F419 Anxiety disorder, unspecified: Secondary | ICD-10-CM | POA: Diagnosis not present

## 2014-11-27 DIAGNOSIS — Z981 Arthrodesis status: Secondary | ICD-10-CM

## 2014-11-27 DIAGNOSIS — Z9049 Acquired absence of other specified parts of digestive tract: Secondary | ICD-10-CM | POA: Diagnosis present

## 2014-11-27 DIAGNOSIS — I10 Essential (primary) hypertension: Secondary | ICD-10-CM | POA: Diagnosis present

## 2014-11-27 DIAGNOSIS — M4310 Spondylolisthesis, site unspecified: Secondary | ICD-10-CM

## 2014-11-27 DIAGNOSIS — Z90710 Acquired absence of both cervix and uterus: Secondary | ICD-10-CM

## 2014-11-27 DIAGNOSIS — M4806 Spinal stenosis, lumbar region: Secondary | ICD-10-CM | POA: Diagnosis not present

## 2014-11-27 DIAGNOSIS — M5136 Other intervertebral disc degeneration, lumbar region: Secondary | ICD-10-CM | POA: Diagnosis not present

## 2014-11-27 DIAGNOSIS — H409 Unspecified glaucoma: Secondary | ICD-10-CM | POA: Diagnosis not present

## 2014-11-27 DIAGNOSIS — E785 Hyperlipidemia, unspecified: Secondary | ICD-10-CM | POA: Diagnosis present

## 2014-11-27 HISTORY — PX: MAXIMUM ACCESS (MAS)POSTERIOR LUMBAR INTERBODY FUSION (PLIF) 2 LEVEL: SHX6369

## 2014-11-27 SURGERY — FOR MAXIMUM ACCESS (MAS) POSTERIOR LUMBAR INTERBODY FUSION (PLIF) 2 LEVEL
Anesthesia: General | Site: Back

## 2014-11-27 MED ORDER — ONDANSETRON HCL 4 MG/2ML IJ SOLN: 4.0000 mg | INTRAMUSCULAR | Status: DC | PRN

## 2014-11-27 MED ORDER — ONDANSETRON HCL 4 MG/2ML IJ SOLN
INTRAMUSCULAR | Status: DC | PRN
  Administered 2014-11-27: 4 mg via INTRAVENOUS

## 2014-11-27 MED ORDER — METHOCARBAMOL 500 MG PO TABS
ORAL_TABLET | ORAL | Status: AC
Start: 2014-11-27 — End: 2014-11-27
  Administered 2014-11-27: 500 mg via ORAL
  Filled 2014-11-27: qty 1

## 2014-11-27 MED ORDER — POTASSIUM CHLORIDE IN NACL 20-0.9 MEQ/L-% IV SOLN
INTRAVENOUS | Status: DC
  Administered 2014-11-27: 14:00:00 via INTRAVENOUS
  Filled 2014-11-27 (×2): qty 1000

## 2014-11-27 MED ORDER — ACETAMINOPHEN 650 MG RE SUPP: 650.0000 mg | RECTAL | Status: DC | PRN

## 2014-11-27 MED ORDER — LISINOPRIL 20 MG PO TABS
20.0000 mg | ORAL_TABLET | Freq: Every day | ORAL | Status: DC
  Administered 2014-11-27 – 2014-11-28 (×2): 20 mg via ORAL
  Filled 2014-11-27 (×2): qty 1

## 2014-11-27 MED ORDER — PROMETHAZINE HCL 25 MG/ML IJ SOLN: 6.2500 mg | INTRAMUSCULAR | Status: DC | PRN

## 2014-11-27 MED ORDER — LACTATED RINGERS IV SOLN
INTRAVENOUS | Status: DC | PRN
  Administered 2014-11-27 (×2): via INTRAVENOUS

## 2014-11-27 MED ORDER — CEFAZOLIN SODIUM 1-5 GM-% IV SOLN
1.0000 g | Freq: Three times a day (TID) | INTRAVENOUS | Status: AC
  Administered 2014-11-27 (×2): 1 g via INTRAVENOUS
  Filled 2014-11-27 (×2): qty 50

## 2014-11-27 MED ORDER — PANTOPRAZOLE SODIUM 40 MG PO TBEC
40.0000 mg | DELAYED_RELEASE_TABLET | Freq: Every day | ORAL | Status: DC
  Administered 2014-11-27 – 2014-11-28 (×2): 40 mg via ORAL
  Filled 2014-11-27 (×2): qty 1

## 2014-11-27 MED ORDER — BUPIVACAINE HCL (PF) 0.25 % IJ SOLN
INTRAMUSCULAR | Status: DC | PRN
  Administered 2014-11-27: 4 mL

## 2014-11-27 MED ORDER — DEXAMETHASONE SODIUM PHOSPHATE 10 MG/ML IJ SOLN
INTRAMUSCULAR | Status: DC | PRN
  Administered 2014-11-27: 10 mg via INTRAVENOUS

## 2014-11-27 MED ORDER — BRIMONIDINE TARTRATE 0.2 % OP SOLN
1.0000 [drp] | Freq: Three times a day (TID) | OPHTHALMIC | Status: DC
  Administered 2014-11-27 – 2014-11-28 (×3): 1 [drp] via OPHTHALMIC
  Filled 2014-11-27: qty 5

## 2014-11-27 MED ORDER — PHENYLEPHRINE 40 MCG/ML (10ML) SYRINGE FOR IV PUSH (FOR BLOOD PRESSURE SUPPORT)
PREFILLED_SYRINGE | INTRAVENOUS | Status: AC
  Filled 2014-11-27: qty 10

## 2014-11-27 MED ORDER — NEOSTIGMINE METHYLSULFATE 10 MG/10ML IV SOLN
INTRAVENOUS | Status: AC
  Filled 2014-11-27: qty 1

## 2014-11-27 MED ORDER — METHOCARBAMOL 1000 MG/10ML IJ SOLN
500.0000 mg | INTRAMUSCULAR | Status: DC
  Filled 2014-11-27: qty 5

## 2014-11-27 MED ORDER — ARTIFICIAL TEARS OP OINT
TOPICAL_OINTMENT | OPHTHALMIC | Status: DC | PRN
  Administered 2014-11-27: 1 via OPHTHALMIC

## 2014-11-27 MED ORDER — ARTIFICIAL TEARS OP OINT
TOPICAL_OINTMENT | OPHTHALMIC | Status: AC
  Filled 2014-11-27: qty 3.5

## 2014-11-27 MED ORDER — SODIUM CHLORIDE 0.9 % IJ SOLN
INTRAMUSCULAR | Status: AC
  Filled 2014-11-27: qty 10

## 2014-11-27 MED ORDER — HYDROMORPHONE HCL 1 MG/ML IJ SOLN
INTRAMUSCULAR | Status: AC
  Administered 2014-11-27: 0.5 mg via INTRAVENOUS
  Filled 2014-11-27: qty 1

## 2014-11-27 MED ORDER — SODIUM CHLORIDE 0.9 % IJ SOLN
3.0000 mL | Freq: Two times a day (BID) | INTRAMUSCULAR | Status: DC
  Administered 2014-11-27 (×2): 3 mL via INTRAVENOUS

## 2014-11-27 MED ORDER — ROCURONIUM BROMIDE 50 MG/5ML IV SOLN
INTRAVENOUS | Status: AC
Start: 2014-11-27 — End: 2014-11-27
  Filled 2014-11-27: qty 1

## 2014-11-27 MED ORDER — FENTANYL CITRATE 0.05 MG/ML IJ SOLN
INTRAMUSCULAR | Status: AC
  Filled 2014-11-27: qty 5

## 2014-11-27 MED ORDER — METHOCARBAMOL 500 MG PO TABS
500.0000 mg | ORAL_TABLET | Freq: Four times a day (QID) | ORAL | Status: DC | PRN
  Administered 2014-11-27 – 2014-11-28 (×2): 500 mg via ORAL
  Filled 2014-11-27 (×3): qty 1

## 2014-11-27 MED ORDER — BRIMONIDINE TARTRATE 0.15 % OP SOLN
1.0000 [drp] | Freq: Three times a day (TID) | OPHTHALMIC | Status: DC
  Filled 2014-11-27: qty 5

## 2014-11-27 MED ORDER — OXYCODONE-ACETAMINOPHEN 5-325 MG PO TABS
ORAL_TABLET | ORAL | Status: AC
  Administered 2014-11-27: 2 via ORAL
  Filled 2014-11-27: qty 2

## 2014-11-27 MED ORDER — THROMBIN 20000 UNITS EX SOLR
CUTANEOUS | Status: DC | PRN
  Administered 2014-11-27: 10:00:00 via TOPICAL

## 2014-11-27 MED ORDER — DEXTROSE 5 % IV SOLN
10.0000 mg | INTRAVENOUS | Status: DC | PRN
  Administered 2014-11-27: 40 ug/min via INTRAVENOUS

## 2014-11-27 MED ORDER — SUCCINYLCHOLINE CHLORIDE 20 MG/ML IJ SOLN
INTRAMUSCULAR | Status: DC | PRN
  Administered 2014-11-27: 100 mg via INTRAVENOUS

## 2014-11-27 MED ORDER — HYDROMORPHONE HCL 1 MG/ML IJ SOLN
0.2500 mg | INTRAMUSCULAR | Status: DC | PRN
  Administered 2014-11-27 (×4): 0.5 mg via INTRAVENOUS

## 2014-11-27 MED ORDER — MENTHOL 3 MG MT LOZG: 1.0000 | LOZENGE | OROMUCOSAL | Status: DC | PRN

## 2014-11-27 MED ORDER — SODIUM CHLORIDE 0.9 % IJ SOLN: 3.0000 mL | INTRAMUSCULAR | Status: DC | PRN

## 2014-11-27 MED ORDER — SODIUM CHLORIDE 0.9 % IV SOLN: 250.0000 mL | INTRAVENOUS | Status: DC

## 2014-11-27 MED ORDER — LATANOPROST 0.005 % OP SOLN
1.0000 [drp] | Freq: Every day | OPHTHALMIC | Status: DC
  Administered 2014-11-27: 1 [drp] via OPHTHALMIC
  Filled 2014-11-27: qty 2.5

## 2014-11-27 MED ORDER — MORPHINE SULFATE 2 MG/ML IJ SOLN
1.0000 mg | INTRAMUSCULAR | Status: DC | PRN
  Administered 2014-11-27: 2 mg via INTRAVENOUS
  Administered 2014-11-27: 4 mg via INTRAVENOUS
  Filled 2014-11-27 (×3): qty 1

## 2014-11-27 MED ORDER — PHENYLEPHRINE HCL 10 MG/ML IJ SOLN
INTRAMUSCULAR | Status: DC | PRN
  Administered 2014-11-27 (×3): 80 ug via INTRAVENOUS

## 2014-11-27 MED ORDER — SUCCINYLCHOLINE CHLORIDE 20 MG/ML IJ SOLN
INTRAMUSCULAR | Status: AC
  Filled 2014-11-27: qty 1

## 2014-11-27 MED ORDER — SODIUM CHLORIDE 0.9 % IR SOLN
Status: DC | PRN
  Administered 2014-11-27: 09:00:00

## 2014-11-27 MED ORDER — MIDAZOLAM HCL 5 MG/5ML IJ SOLN
INTRAMUSCULAR | Status: DC | PRN
  Administered 2014-11-27: 2 mg via INTRAVENOUS

## 2014-11-27 MED ORDER — GLYCOPYRROLATE 0.2 MG/ML IJ SOLN
INTRAMUSCULAR | Status: AC
  Filled 2014-11-27: qty 2

## 2014-11-27 MED ORDER — PROPOFOL 10 MG/ML IV BOLUS
INTRAVENOUS | Status: DC | PRN
  Administered 2014-11-27: 150 mg via INTRAVENOUS

## 2014-11-27 MED ORDER — ONDANSETRON HCL 4 MG/2ML IJ SOLN
INTRAMUSCULAR | Status: AC
  Filled 2014-11-27: qty 2

## 2014-11-27 MED ORDER — SERTRALINE HCL 100 MG PO TABS
100.0000 mg | ORAL_TABLET | Freq: Every day | ORAL | Status: DC
  Administered 2014-11-27 – 2014-11-28 (×2): 100 mg via ORAL
  Filled 2014-11-27 (×2): qty 1

## 2014-11-27 MED ORDER — ACETAMINOPHEN 325 MG PO TABS: 650.0000 mg | ORAL_TABLET | ORAL | Status: DC | PRN

## 2014-11-27 MED ORDER — OXYBUTYNIN CHLORIDE ER 10 MG PO TB24
10.0000 mg | ORAL_TABLET | Freq: Every day | ORAL | Status: DC
  Administered 2014-11-27 – 2014-11-28 (×2): 10 mg via ORAL
  Filled 2014-11-27 (×2): qty 1

## 2014-11-27 MED ORDER — THROMBIN 5000 UNITS EX SOLR
OROMUCOSAL | Status: DC | PRN
  Administered 2014-11-27: 10:00:00 via TOPICAL

## 2014-11-27 MED ORDER — ALPRAZOLAM 0.25 MG PO TABS: 0.2500 mg | ORAL_TABLET | Freq: Three times a day (TID) | ORAL | Status: DC | PRN

## 2014-11-27 MED ORDER — HYDROCHLOROTHIAZIDE 12.5 MG PO CAPS
12.5000 mg | ORAL_CAPSULE | Freq: Every day | ORAL | Status: DC
  Administered 2014-11-28: 12.5 mg via ORAL
  Filled 2014-11-27: qty 1

## 2014-11-27 MED ORDER — DEXAMETHASONE 4 MG PO TABS
4.0000 mg | ORAL_TABLET | Freq: Four times a day (QID) | ORAL | Status: DC
  Administered 2014-11-27 – 2014-11-28 (×2): 4 mg via ORAL
  Filled 2014-11-27 (×2): qty 1

## 2014-11-27 MED ORDER — 0.9 % SODIUM CHLORIDE (POUR BTL) OPTIME
TOPICAL | Status: DC | PRN
  Administered 2014-11-27: 1000 mL

## 2014-11-27 MED ORDER — PHENOL 1.4 % MT LIQD: 1.0000 | OROMUCOSAL | Status: DC | PRN

## 2014-11-27 MED ORDER — DORZOLAMIDE HCL-TIMOLOL MAL 2-0.5 % OP SOLN
1.0000 [drp] | Freq: Two times a day (BID) | OPHTHALMIC | Status: DC
  Administered 2014-11-27 – 2014-11-28 (×2): 1 [drp] via OPHTHALMIC
  Filled 2014-11-27: qty 10

## 2014-11-27 MED ORDER — DEXAMETHASONE SODIUM PHOSPHATE 4 MG/ML IJ SOLN
4.0000 mg | Freq: Four times a day (QID) | INTRAMUSCULAR | Status: DC
  Administered 2014-11-27 – 2014-11-28 (×3): 4 mg via INTRAVENOUS
  Filled 2014-11-27 (×3): qty 1

## 2014-11-27 MED ORDER — EPHEDRINE SULFATE 50 MG/ML IJ SOLN
INTRAMUSCULAR | Status: AC
  Filled 2014-11-27: qty 1

## 2014-11-27 MED ORDER — METHOCARBAMOL 1000 MG/10ML IJ SOLN
500.0000 mg | Freq: Four times a day (QID) | INTRAVENOUS | Status: DC | PRN
  Filled 2014-11-27: qty 5

## 2014-11-27 MED ORDER — MIDAZOLAM HCL 2 MG/2ML IJ SOLN
INTRAMUSCULAR | Status: AC
  Filled 2014-11-27: qty 2

## 2014-11-27 MED ORDER — LIDOCAINE HCL (CARDIAC) 20 MG/ML IV SOLN
INTRAVENOUS | Status: AC
  Filled 2014-11-27: qty 5

## 2014-11-27 MED ORDER — OXYCODONE-ACETAMINOPHEN 5-325 MG PO TABS
1.0000 | ORAL_TABLET | ORAL | Status: DC | PRN
  Administered 2014-11-27 – 2014-11-28 (×4): 2 via ORAL
  Filled 2014-11-27 (×4): qty 2

## 2014-11-27 MED ORDER — PROPOFOL 10 MG/ML IV BOLUS
INTRAVENOUS | Status: AC
  Filled 2014-11-27: qty 20

## 2014-11-27 MED ORDER — LIDOCAINE HCL (CARDIAC) 20 MG/ML IV SOLN
INTRAVENOUS | Status: DC | PRN
  Administered 2014-11-27: 60 mg via INTRAVENOUS

## 2014-11-27 MED ORDER — FENTANYL CITRATE 0.05 MG/ML IJ SOLN
INTRAMUSCULAR | Status: DC | PRN
  Administered 2014-11-27: 50 ug via INTRAVENOUS
  Administered 2014-11-27: 100 ug via INTRAVENOUS
  Administered 2014-11-27 (×5): 50 ug via INTRAVENOUS

## 2014-11-27 MED ORDER — CELECOXIB 200 MG PO CAPS
200.0000 mg | ORAL_CAPSULE | Freq: Two times a day (BID) | ORAL | Status: DC
  Administered 2014-11-27 – 2014-11-28 (×2): 200 mg via ORAL
  Filled 2014-11-27 (×2): qty 1

## 2014-11-27 SURGICAL SUPPLY — 69 items
BAG DECANTER FOR FLEXI CONT (MISCELLANEOUS) ×2 IMPLANT
BENZOIN TINCTURE PRP APPL 2/3 (GAUZE/BANDAGES/DRESSINGS) ×2 IMPLANT
BLADE CLIPPER SURG (BLADE) IMPLANT
BONE MATRIX OSTEOCEL PRO SM (Bone Implant) ×2 IMPLANT
BUR MATCHSTICK NEURO 3.0 LAGG (BURR) ×2 IMPLANT
CAGE COROENT MP 8X9X23M-8 SPIN (Cage) ×4 IMPLANT
CAGE MAS PLIF 9X9X23-8 LUMBAR (Cage) ×4 IMPLANT
CANISTER SUCT 3000ML (MISCELLANEOUS) ×2 IMPLANT
CLIP NEUROVISION LG (CLIP) ×2 IMPLANT
CONT SPEC 4OZ CLIKSEAL STRL BL (MISCELLANEOUS) ×4 IMPLANT
COVER BACK TABLE 24X17X13 BIG (DRAPES) IMPLANT
COVER BACK TABLE 60X90IN (DRAPES) ×2 IMPLANT
DRAPE C-ARM 42X72 X-RAY (DRAPES) ×4 IMPLANT
DRAPE C-ARMOR (DRAPES) ×2 IMPLANT
DRAPE LAPAROTOMY 100X72X124 (DRAPES) ×2 IMPLANT
DRAPE POUCH INSTRU U-SHP 10X18 (DRAPES) ×2 IMPLANT
DRAPE SURG 17X23 STRL (DRAPES) ×2 IMPLANT
DRSG OPSITE 4X5.5 SM (GAUZE/BANDAGES/DRESSINGS) ×2 IMPLANT
DRSG OPSITE POSTOP 4X6 (GAUZE/BANDAGES/DRESSINGS) ×2 IMPLANT
DRSG TELFA 3X8 NADH (GAUZE/BANDAGES/DRESSINGS) ×2 IMPLANT
DURAPREP 26ML APPLICATOR (WOUND CARE) ×2 IMPLANT
ELECT REM PT RETURN 9FT ADLT (ELECTROSURGICAL) ×2
ELECTRODE REM PT RTRN 9FT ADLT (ELECTROSURGICAL) ×1 IMPLANT
EVACUATOR 1/8 PVC DRAIN (DRAIN) ×2 IMPLANT
GAUZE SPONGE 4X4 16PLY XRAY LF (GAUZE/BANDAGES/DRESSINGS) IMPLANT
GLOVE BIO SURGEON STRL SZ8 (GLOVE) ×4 IMPLANT
GLOVE BIOGEL PI IND STRL 7.5 (GLOVE) ×2 IMPLANT
GLOVE BIOGEL PI INDICATOR 7.5 (GLOVE) ×2
GLOVE ECLIPSE 9.0 STRL (GLOVE) ×2 IMPLANT
GLOVE SURG SS PI 7.0 STRL IVOR (GLOVE) ×6 IMPLANT
GOWN STRL REUS W/ TWL LRG LVL3 (GOWN DISPOSABLE) ×1 IMPLANT
GOWN STRL REUS W/ TWL XL LVL3 (GOWN DISPOSABLE) ×2 IMPLANT
GOWN STRL REUS W/TWL 2XL LVL3 (GOWN DISPOSABLE) IMPLANT
GOWN STRL REUS W/TWL LRG LVL3 (GOWN DISPOSABLE) ×1
GOWN STRL REUS W/TWL XL LVL3 (GOWN DISPOSABLE) ×2
HEMOSTAT POWDER KIT SURGIFOAM (HEMOSTASIS) IMPLANT
KIT BASIN OR (CUSTOM PROCEDURE TRAY) ×2 IMPLANT
KIT NEEDLE NVM5 EMG ELECT (KITS) ×1 IMPLANT
KIT NEEDLE NVM5 EMG ELECTRODE (KITS) ×1
KIT ROOM TURNOVER OR (KITS) ×2 IMPLANT
MILL MEDIUM DISP (BLADE) IMPLANT
NEEDLE HYPO 25X1 1.5 SAFETY (NEEDLE) ×2 IMPLANT
NS IRRIG 1000ML POUR BTL (IV SOLUTION) ×2 IMPLANT
PACK LAMINECTOMY NEURO (CUSTOM PROCEDURE TRAY) ×2 IMPLANT
PAD ARMBOARD 7.5X6 YLW CONV (MISCELLANEOUS) ×6 IMPLANT
PUTTY KINEX BIOACTIVE 5CC (Bone Implant) ×2 IMPLANT
ROD PLIF MAS 65MM (Rod) ×4 IMPLANT
SCREW LOCK (Screw) ×6 IMPLANT
SCREW LOCK FXNS SPNE MAS PL (Screw) ×6 IMPLANT
SCREW MAS PLIF 5.5X30 (Screw) ×2 IMPLANT
SCREW PAS PLIF 5X30 (Screw) ×2 IMPLANT
SCREW PLIF MAS 5.0X25MM (Screw) ×2 IMPLANT
SCREW PLIF MAS 5.0X35 (Screw) ×2 IMPLANT
SCREW SHANK 5.0X35 (Screw) ×4 IMPLANT
SCREW TULIP 5.5 (Screw) ×4 IMPLANT
SPONGE LAP 4X18 X RAY DECT (DISPOSABLE) ×2 IMPLANT
SPONGE SURGIFOAM ABS GEL 100 (HEMOSTASIS) ×2 IMPLANT
STRIP CLOSURE SKIN 1/2X4 (GAUZE/BANDAGES/DRESSINGS) ×4 IMPLANT
STRIP CLOSURE SKIN 1/4X4 (GAUZE/BANDAGES/DRESSINGS) ×2 IMPLANT
SUT VIC AB 0 CT1 18XCR BRD8 (SUTURE) ×2 IMPLANT
SUT VIC AB 0 CT1 8-18 (SUTURE) ×2
SUT VIC AB 2-0 CP2 18 (SUTURE) ×4 IMPLANT
SUT VIC AB 3-0 SH 8-18 (SUTURE) ×4 IMPLANT
SYR 20ML ECCENTRIC (SYRINGE) ×2 IMPLANT
SYR 3ML LL SCALE MARK (SYRINGE) IMPLANT
TOWEL OR 17X24 6PK STRL BLUE (TOWEL DISPOSABLE) ×2 IMPLANT
TOWEL OR 17X26 10 PK STRL BLUE (TOWEL DISPOSABLE) ×2 IMPLANT
TRAY FOLEY CATH 14FRSI W/METER (CATHETERS) ×2 IMPLANT
WATER STERILE IRR 1000ML POUR (IV SOLUTION) ×2 IMPLANT

## 2014-11-27 NOTE — Transfer of Care (Signed)
Immediate Anesthesia Transfer of Care Note  Patient: Melissa Gonzales  Procedure(s) Performed: Procedure(s) with comments: LUMBAR THREE-FOUR, LUMBAR FOUR-FIVE MAXIMUM ACCESS POSTERIOR LUMBAR INTERBODY FUSION (N/A) - L3-4 L4-5 MAXIMUM ACCESS POSTERIOR LUMBAR INTERBODY FUSION  Patient Location: PACU  Anesthesia Type:General  Level of Consciousness: awake, alert , oriented and patient cooperative  Airway & Oxygen Therapy: Patient Spontanous Breathing and Patient connected to nasal cannula oxygen  Post-op Assessment: Report given to PACU RN and Post -op Vital signs reviewed and stable  Post vital signs: Reviewed and stable  Complications: No apparent anesthesia complications

## 2014-11-27 NOTE — Anesthesia Preprocedure Evaluation (Addendum)
Anesthesia Evaluation  Patient identified by MRN, date of birth, ID band Patient awake    Reviewed: Allergy & Precautions, NPO status , Patient's Chart, lab work & pertinent test results  Airway Mallampati: III  TM Distance: >3 FB Neck ROM: Full    Dental  (+) Teeth Intact   Pulmonary neg pulmonary ROS,          Cardiovascular hypertension, Pt. on medications     Neuro/Psych  Headaches,    GI/Hepatic Neg liver ROS, GERD-  ,  Endo/Other  negative endocrine ROS  Renal/GU negative Renal ROS     Musculoskeletal   Abdominal   Peds  Hematology negative hematology ROS (+)   Anesthesia Other Findings   Reproductive/Obstetrics                            Anesthesia Physical Anesthesia Plan  ASA: II  Anesthesia Plan: General   Post-op Pain Management:    Induction: Intravenous  Airway Management Planned: Oral ETT  Additional Equipment:   Intra-op Plan:   Post-operative Plan: Extubation in OR  Informed Consent: I have reviewed the patients History and Physical, chart, labs and discussed the procedure including the risks, benefits and alternatives for the proposed anesthesia with the patient or authorized representative who has indicated his/her understanding and acceptance.   Dental advisory given  Plan Discussed with: CRNA  Anesthesia Plan Comments:         Anesthesia Quick Evaluation

## 2014-11-27 NOTE — Progress Notes (Signed)
PT Cancellation Note  Patient Details Name: Quitman LivingsCarol A Renfro MRN: [id number] DOB: 10-Aug-1941   Cancelled Treatment:    Reason Eval/Treat Not Completed: Medical issues which prohibited therapy Pt does not have back brace present in room. Notified RN to get Dr. Yetta BarreJones to write order for brace and to contact [employer] for brace delivery prior to PT evaluation.   Alvie HeidelbergFolan, Zhaniya Swallows A 11/27/2014, 2:54 PM  Alvie HeidelbergShauna Folan, PT, DPT [phone]

## 2014-11-27 NOTE — Anesthesia Postprocedure Evaluation (Signed)
  Anesthesia Post-op Note  Patient: Quitman Livingsarol A Bilger  Procedure(s) Performed: Procedure(s) with comments: LUMBAR THREE-FOUR, LUMBAR FOUR-FIVE MAXIMUM ACCESS POSTERIOR LUMBAR INTERBODY FUSION (N/A) - L3-4 L4-5 MAXIMUM ACCESS POSTERIOR LUMBAR INTERBODY FUSION  Patient Location: PACU  Anesthesia Type:General  Level of Consciousness: awake and alert   Airway and Oxygen Therapy: Patient Spontanous Breathing  Post-op Pain: none  Post-op Assessment: Post-op Vital signs reviewed  Post-op Vital Signs: Reviewed  Last Vitals:  Filed Vitals:   11/27/14 1245  BP: 117/55  Pulse: 92  Temp: 36.8 C  Resp: 15    Complications: No apparent anesthesia complications

## 2014-11-27 NOTE — H&P (Signed)
Subjective: Patient is a 74 y.o. female admitted for PLIF L3-4, L4-5. Onset of symptoms was several months ago, gradually worsening since that time.  The pain is rated severe, and is located at the across the lower back and radiates to legs. The pain is described as aching and occurs all day. The symptoms have been progressive. Symptoms are exacerbated by exercise. 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   Past Medical History  Diagnosis Date  . Anxiety   . GERD (gastroesophageal reflux disease)   . Hyperlipidemia   . Hypertension   . Depression   . Glaucoma     Past Surgical History  Procedure Laterality Date  . Cholecystectomy  1993  . Vaginal hysterectomy  1970s    uterine prolapse  . Eye surgery      Cataract Surgery Bilateral, 5 years ago    Prior to Admission medications   Medication Sig Start Date End Date Taking? Authorizing Provider  ALPHAGAN P 0.1 % SOLN 1 drop every 12 (twelve) hours.  04/05/12  Yes Historical Provider, MD  ALPRAZolam (XANAX) 0.25 MG tablet TAKE 1 TABLET BY MOUTH THREE TIMES DAILY AS NEEDED FOR SLEEP 11/11/14  Yes [place] N Rumley, DO  atorvastatin (LIPITOR) 40 MG tablet take 1 tablet by mouth once daily 06/26/14  Yes [place] N Rumley, DO  Calcium Carb-Cholecalciferol (CALCIUM 600 + D PO) Take 1 tablet by mouth 2 (two) times daily.   Yes Historical Provider, MD  dorzolamide-timolol (COSOPT) 22.3-6.8 MG/ML ophthalmic solution Place 1 drop into both eyes 2 (two) times daily.  03/20/11  Yes Historical Provider, MD  esomeprazole (NEXIUM) 40 MG capsule Take 1 capsule (40 mg total) by mouth daily before breakfast. 05/22/14  Yes [place]al[place] N Rumley, DO  glucosamine-chondroitin 500-400 MG tablet Take 1 tablet by mouth 2 (two) times daily.   Yes Historical Provider, MD  hydrochlorothiazide (MICROZIDE) 12.5 MG capsule Take 1 capsule (12.5 mg total) by mouth daily. 05/04/14  Yes [place] Peaches[place], MD  HYDROcodone-acetaminophen (NORCO) 5-325 MG per  tablet Take 1 tablet by mouth every 6 (six) hours as needed for moderate pain. 09/14/14  Yes Lenda KelpShane R Hudnall, MD  latanoprost (XALATAN) 0.005 % ophthalmic solution Place 1 drop into both eyes at bedtime.  06/21/14  Yes Historical Provider, MD  lisinopril (PRINIVIL,ZESTRIL) 20 MG tablet Take 1 tablet (20 mg total) by mouth daily. 10/10/12  Yes [place], MD  loratadine (CLARITIN) 10 MG tablet Take 10 mg by mouth daily.     Yes Historical Provider, MD  Multiple Vitamins-Minerals (MULTIVITAMIN WITH MINERALS) tablet Take 1 tablet by mouth daily.   Yes Historical Provider, MD  naproxen sodium (ANAPROX) 220 MG tablet Take 440 mg by mouth daily as needed (pain).   Yes Historical Provider, MD  Omega-3 Fatty Acids (FISH OIL) 1000 MG CAPS Take 1 capsule by mouth 2 (two) times daily.   Yes Historical Provider, MD  oxybutynin (DITROPAN-XL) 10 MG 24 hr tablet Take 1 tablet (10 mg total) by mouth daily. 05/12/13  Yes [place], MD  sertraline (ZOLOFT) 100 MG tablet Take 1 tablet (100 mg total) by mouth daily. 11/26/13  Yes [place], MD  butalbital-acetaminophen-caffeine (FIORICET, ESGIC) [phone] MG per tablet TAKE 1 TABLET BY MOUTH TWICE DAILY AS NEEDED FOR HEADACHE 08/20/14   Pincus LargeJazma Y Phelps, DO  conjugated estrogens (PREMARIN) vaginal cream Place vaginally daily. Patient not taking: Reported on 11/18/2014 10/10/12   [place]  [place] Peaches, MD  gabapentin (NEURONTIN) 300 MG capsule 1 capsule po qam; 2 capsules po in afternoon, 2 capsules po qhs Patient not taking: Reported on 11/18/2014 09/14/14   Lenda Kelp, MD  Ketoprofen POWD Ketoprofen gel 20% aaa tid Patient not taking: Reported on 11/18/2014 07/17/11   Enid Baas, MD  meloxicam [facility]) 15 MG tablet Take one pill daily with food for 7 days then prn Patient not taking: Reported on 11/18/2014 05/06/12   Ralene Cork, DO   No Known Allergies  History  Substance Use Topics  . Smoking status: Never Smoker    . Smokeless tobacco: Not on file  . Alcohol Use: 0.6 oz/week    1 Glasses of wine per week     Comment: 1 glass per night    Family History  Problem Relation Age of Onset  . Mental illness Daughter   . Drug abuse Grandchild   . Heart disease Mother 76    died MI age 54  . Hyperlipidemia Sister   . Hypertension Sister   . Diabetes Neg Hx   . Cancer Father     Primary Cell Liver Cancer     Review of Systems  Positive ROS: neg  All other systems have been reviewed and were otherwise negative with the exception of those mentioned in the HPI and as above.  Objective: Vital signs in last 24 hours: Temp:  [97.8 F (36.6 C)] 97.8 F (36.6 C) (01/22 0625) Pulse Rate:  [73] 73 (01/22 0625) Resp:  [18] 18 (01/22 0625) BP: (125)/(67) 125/67 mmHg (01/22 0625) SpO2:  [93 %] 93 % (01/22 0625) Weight:  [155 lb (70.308 kg)] 155 lb (70.308 kg) (01/22 0625)  General Appearance: Alert, cooperative, no distress, appears stated age Head: Normocephalic, without obvious abnormality, atraumatic Eyes: PERRL, conjunctiva/corneas clear, EOM's intact    Neck: Supple, symmetrical, trachea midline Back: Symmetric, no curvature, ROM normal, no CVA tenderness Lungs:  respirations unlabored Heart: Regular rate and rhythm Abdomen: Soft, non-tender Extremities: Extremities normal, atraumatic, no cyanosis or edema Pulses: 2+ and symmetric all extremities Skin: Skin color, texture, turgor normal, no rashes or lesions  NEUROLOGIC:   Mental status: Alert and oriented x4,  no aphasia, good attention span, fund of knowledge, and memory Motor Exam - grossly normal Sensory Exam - grossly normal Reflexes: 1+ Coordination - grossly normal Gait - grossly normal Balance - grossly normal Cranial Nerves: I: smell Not tested  II: visual acuity  OS: nl    OD: nl  II: visual fields Full to confrontation  II: pupils Equal, round, reactive to light  III,VII: ptosis None  III,IV,VI: extraocular muscles  Full  ROM  V: mastication Normal  V: facial light touch sensation  Normal  V,VII: corneal reflex  Present  VII: facial muscle function - upper  Normal  VII: facial muscle function - lower Normal  VIII: hearing Not tested  IX: soft palate elevation  Normal  IX,X: gag reflex Present  XI: trapezius strength  5/5  XI: sternocleidomastoid strength 5/5  XI: neck flexion strength  5/5  XII: tongue strength  Normal    Data Review Lab Results  Component Value Date   WBC 4.4 11/19/2014   HGB 14.1 11/19/2014   HCT 40.7 11/19/2014   MCV 98.5 11/19/2014   PLT 190 11/19/2014   Lab Results  Component Value Date   NA 139 11/19/2014   K 4.2 11/19/2014   CL 105 11/19/2014   CO2 26 11/19/2014  BUN 19 11/19/2014   CREATININE 0.93 11/19/2014   GLUCOSE 90 11/19/2014   No results found for: INR, PROTIME  Assessment/Plan: Patient admitted for PLIF L3-4, L4-5. Patient has failed a reasonable attempt at conservative therapy.  I explained the condition and procedure to the patient and answered any questions.  Patient wishes to proceed with procedure as planned. Understands risks/ benefits and typical outcomes of procedure.   Ticara Waner S 11/27/2014 7:26 AM

## 2014-11-27 NOTE — Op Note (Signed)
11/27/2014  11:46 AM  PATIENT:  Melissa Gonzales  74 y.o. 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 Decompressive lumbar laminectomy L3-4 and L4-5 requiring more work than would be required for a simple exposure of the disk for PLIF in order to adequately decompress the neural elements and address the spinal stenosis 2. Posterior lumbar interbody fusion L3-4 and L4-5 using PEEK interbody cages packed with morcellized allograft and autograft 3. Posterior fixation L3-L5 inclusive using cortical pedicle screws.  4. Intertransverse arthrodesis L3-L5 using morcellized autograft and allograft.  SURGEON:  Marikay Alaravid Jones, MD  ASSISTANTS: Dr. Jordan LikesPool  ANESTHESIA:  General  EBL: 250 ml  Total I/O In: 1750 [I.V.:1750] Out: 550 [Urine:300; Blood:250]  BLOOD ADMINISTERED:none  DRAINS: Hemovac   INDICATION FOR PROCEDURE: This patient presented with a long history of back and bilateral leg pain. Her pain was severe and interferes with her quality of life and activities of daily living. She tried medical management without relief. RI showed severe spinal stenosis with spondylolisthesis at L3-4 and L4-5. I recommended decompression and instrumented fusion to address her segmental instability and spinal stenosis. Patient understood the risks, benefits, and alternatives and potential outcomes and wished to proceed.  PROCEDURE DETAILS:  The patient was brought to the operating room. After induction of generalized endotracheal anesthesia the patient was rolled into the prone position on chest rolls and all pressure points were padded. The patient's lumbar region was cleaned and then prepped with DuraPrep and draped in the usual sterile fashion. Anesthesia was injected and then a dorsal midline incision was made and carried down to the lumbosacral fascia. The fascia was opened and the paraspinous  musculature was taken down in a subperiosteal fashion to expose the L3-4 and L4-5. A self-retaining retractor was placed. Intraoperative fluoroscopy confirmed my level, and I started with placement of the L3 cortical pedicle screws. The pedicle screw entry zones were identified utilizing surface landmarks and  AP and lateral fluoroscopy. I scored the cortex with the high-speed drill and then used the hand drill and EMG monitoring to drill an upward and outward direction into the pedicle. I then tapped line to line, and the tap was also monitored. I then placed a 5-0 x 30 mm cortical pedicle screw into the pedicles of L3 bilaterally. I then turned my attention to the decompression and the spinous process was removed and complete lumbar laminectomies, hemi- facetectomies, and foraminotomies were performed at L3-4 and L4-5. The patient had significant spinal stenosis and this required more work than would be required for a simple exposure of the disc for posterior lumbar interbody fusion. Much more generous decompression was undertaken in order to adequately decompress the neural elements and address the patient's leg pain. The yellow ligament was removed to expose the underlying dura and nerve roots, and generous foraminotomies were performed to adequately decompress the neural elements. Both the exiting and traversing nerve roots were decompressed on both sides until a coronary dilator passed easily along the nerve roots. Once the decompression was complete, I turned my attention to the posterior lower lumbar interbody fusion. The epidural venous vasculature was coagulated and cut sharply. Disc space was incised and the initial discectomy was performed with pituitary rongeurs. The disc space was distracted with sequential distractors to a height of 9 mm at each level. We then used a series of scrapers and shavers to prepare the endplates for  fusion. The midline was prepared with Epstein curettes. Once the complete  discectomy was finished, we packed an appropriate sized peek interbody cage with local autograft and morcellized allograft, gently retracted the nerve root, and tapped the cage into position at L3-4 and L4-5.  The midline between the cages was packed with morselized autograft and allograft. We then turned our attention to the placement of the lower pedicle screws. The pedicle screw entry zones were identified utilizing surface landmarks and fluoroscopy. I drilled into each pedicle utilizing the hand drill and EMG monitoring, and tapped each pedicle with the appropriate tap. We palpated with a ball probe to assure no break in the cortex. We then placed 5-0 x 30 mm cortical pedicle screws into the pedicles bilaterally at L4 and L5 bilaterally. We then decorticated the transverse processes and laid a mixture of morcellized autograft and allograft out over these to perform intertransverse arthrodesis at L4-5 and L3-4. We then placed lordotic rods into the multiaxial screw heads of the pedicle screws and locked these in position with the locking caps and anti-torque device. We then checked our construct with AP and lateral fluoroscopy. Irrigated with copious amounts of bacitracin-containing saline solution. Placed a medium Hemovac drain through separate stab incision. Inspected the nerve roots once again to assure adequate decompression, lined to the dura with Gelfoam, and closed the muscle and the fascia with 0 Vicryl. Closed the subcutaneous tissues with 2-0 Vicryl and subcuticular tissues with 3-0 Vicryl. The skin was closed with benzoin and Steri-Strips. Dressing was then applied, the patient was awakened from general anesthesia and transported to the recovery room in stable condition. At the end of the procedure all sponge, needle and instrument counts were correct.   PLAN OF CARE: Admit to inpatient   PATIENT DISPOSITION:  PACU - hemodynamically stable.   Delay start of Pharmacological VTE agent (>24hrs) due  to surgical blood loss or risk of bleeding:  yes

## 2014-11-28 MED ORDER — HYDROCODONE-ACETAMINOPHEN 10-325 MG PO TABS: 1.0000 | ORAL_TABLET | ORAL | Status: DC | PRN

## 2014-11-28 MED ORDER — METHOCARBAMOL 500 MG PO TABS: 500.0000 mg | ORAL_TABLET | Freq: Four times a day (QID) | ORAL | Status: DC | PRN

## 2014-11-28 NOTE — Discharge Instructions (Signed)
Spinal Fusion Spinal fusion is a procedure to make 2 or more of the bones in your spinal column (vertebrae) grow together (fuse). This procedure stops movement between the vertebrae and can relieve pain and prevent deformity.  Spinal fusion is used to treat the following conditions:  Fractures of the spine.  Herniated disk (the spongy material [cartilage] between the vertebrae).  Abnormal curvatures of the spine, such as scoliosis or kyphosis.  A weak or an unstable spine, caused by infections or tumor. RISKS AND COMPLICATIONS Complications associated with spinal fusion are rare, but they can occur. Possible complications include:  Bleeding.  Infection near the incision.  Nerve damage. Signs of nerve damage are back pain, pain in one or both legs, weakness, or numbness.  Spinal fluid leakage.  Blood clot in your leg, which can move to your lungs.  Difficulty controlling urination or bowel movements. BEFORE THE PROCEDURE  A medical evaluation will be done. This will include a physical exam, blood tests, and imaging exams.  You will talk with an anesthesiologist. This is the person who will be in charge of the anesthesia during the procedure. Spinal fusion usually requires that you are asleep during the procedure (general anesthesia).  You will need to stop taking certain medicines, particularly those associated with an increased risk of bleeding. Ask your caregiver about changing or stopping your regular medicines.  If you smoke, you will need to stop at least 2 weeks before the procedure. Smoking can slow down the healing process, especially fusion of the vertebrae, and increase the risk of complications.  Do not eat or drink anything for at least 8 hours before the procedure. PROCEDURE  A cut (incision) is made over the vertebrae that will be fused. The back muscles are separated from the vertebrae. If you are having this procedure to treat a herniated disk, the disc material  pressing on the nerve root is removed (decompression). The area where the disk is removed is then filled with extra bone. Bone from another part of your body (autogenous bone) or bone from a bone donor (allograft bone) may be used. The extra bone promotes fusion between the vertebrae. Sometimes, specific medicines are added to the fusion area to promote bone healing. In most cases, screws and rods or metal plates will be used to attach the vertebrae to stabilize them while they fuse.  AFTER THE PROCEDURE   You will stay in a recovery area until the anesthesia has worn off. Your blood pressure and pulse will be checked frequently.  You will be given antibiotics to prevent infection.  You may continue to receive fluids through an intravenous (IV) tube while you are still in the hospital.  Pain after surgery is normal. You will be given pain medicine.  You will be taught how to move correctly and how to stand and walk. While in bed, you will be instructed to turn frequently, using a "log rolling" technique, in which the entire body is moved without twisting the back. Document Released: 07/22/2003 Document Revised: 01/15/2012 Document Reviewed: 01/05/2011 ExitCare Patient Information 2015 [employer]. This information is not intended to replace advice given to you by your health care provider. Make sure you discuss any questions you have with your health care provider.  

## 2014-11-28 NOTE — Progress Notes (Signed)
Discharge instructions and prescriptions given to pt. Pt verbally acknowledged understanding. Pt voiced no questions when prompted. Pt to be transported to home by private car by family member. Family members present in room helping pt gather personal belongings. Will monitor   Andrew AuVafiadis, Aldeen Riga I 11/28/2014 2:55 PM

## 2014-11-28 NOTE — Progress Notes (Signed)
Went to see Melissa Gonzales in the hospital this morning. Reports that the surgery went well and she is doing well today. Reports improvement in headache since last office visit. Appreciate care by Neurosurgery. Will continue to follow progress and make social visits.  Dr. Caroleen Hammanumley, PGY 1 Family Medicine

## 2014-11-28 NOTE — Evaluation (Signed)
Occupational Therapy Evaluation Patient Details Name: Melissa Gonzales MRN: [id number] DOB: [birth year] Today's Date: 11/28/2014    History of Present Illness Melissa Gonzales is a 74 y.o. Female s/p PLIF 2 levels.    Clinical Impression   PTA pt lived at home alone and was independent with ADLs. Pt currently at Mod I level with use of RW and back brace. All education and training completed with pt for incorporating back precautions into ADLs and compensatory techniques. No further acute OT needs.     Follow Up Recommendations  No OT follow up;Supervision - Intermittent    Equipment Recommendations  3 in 1 bedside comode    Recommendations for Other Services       Precautions / Restrictions Precautions Precautions: Back Precaution Booklet Issued: Yes (comment) Precaution Comments: Educated pt on back precautions and incorporated into ADLs.  Required Braces or Orthoses: Spinal Brace Spinal Brace: Applied in sitting position;Other (comment) Spinal Brace Comments: pt has brace from home recommended by her OP PT. Per pt, Dr. Dutch Quint stated this brace was appropriate today.  Restrictions Weight Bearing Restrictions: No      Mobility Bed Mobility               General bed mobility comments: Pt sitting in recliner chair. Reviewed log roll with patient  Transfers Overall transfer level: Modified independent Equipment used: Rolling walker (2 wheeled)                  Balance Overall balance assessment: No apparent balance deficits (not formally assessed)                                          ADL Overall ADL's : Modified independent                                       General ADL Comments: Pt is overall mod I with ADLs and is able to reach Bil LEs through figure four method. Educated pt on incorporating back precautions into ADLs and compensatory techniques.      Vision  Pt reports no change from baseline.                     Perception Perception Perception Tested?: No   Praxis Praxis Praxis tested?: Within functional limits    Pertinent Vitals/Pain Pain Assessment: No/denies pain     Hand Dominance     Extremity/Trunk Assessment Upper Extremity Assessment Upper Extremity Assessment: Overall WFL for tasks assessed   Lower Extremity Assessment Lower Extremity Assessment: Overall WFL for tasks assessed   Cervical / Trunk Assessment Cervical / Trunk Assessment: Normal   Communication Communication Communication: No difficulties   Cognition Arousal/Alertness: Awake/alert Behavior During Therapy: WFL for tasks assessed/performed Overall Cognitive Status: Within Functional Limits for tasks assessed                                Home Living Family/patient expects to be discharged to:: Private residence Living Arrangements: Alone Available Help at Discharge: Family;Available 24 hours/day (sister is staying with pt until the 31st)               Bathroom Shower/Tub: Tub/shower unit  Prior Functioning/Environment Level of Independence: Independent             OT Diagnosis: Generalized weakness;Acute pain    End of Session Equipment Utilized During Treatment: Back brace;Rolling walker  Activity Tolerance: Patient tolerated treatment well Patient left: in chair;with call bell/phone within reach;with family/visitor present   Time: 1219-1238 OT Time Calculation (min): 19 min Charges:  OT General Charges $OT Visit: 1 Procedure OT Evaluation $Initial OT Evaluation Tier I: 1 Procedure G-Codes:    Rae LipsMiller, Berklie Dethlefs M 11/28/2014, 1:31 PM  Carney LivingLeeAnn Marie Barbera Perritt, OTR/L Occupational Therapist [phone] (pager)

## 2014-11-28 NOTE — Discharge Summary (Signed)
Physician Discharge Summary  Patient ID: JOVI ALVIZO MRN: [id number] DOB/AGE: May 05, 1941 74 y.o.  Admit date: 11/27/2014 Discharge date: 11/28/2014  Admission Diagnoses:  Discharge Diagnoses:  Active Problems:   S/P lumbar spinal fusion   Discharged Condition: good  Hospital Course: Patient admitted to the hospital where she underwent an uncomplicated two-level lumbar decompression and fusion. Postoperatively she is doing well. Back and lower extremity pain much improved. Patient up ambulating. Ready for discharge home.  Consults:   Significant Diagnostic Studies:   Treatments:   Discharge Exam: Blood pressure 115/57, pulse 96, temperature 98.2 F (36.8 C), temperature source Oral, resp. rate 16, weight 70.308 kg (155 lb), SpO2 95 %. Awake and alert. Oriented and appropriate. Motor and sensory function intact. Wound clean and dry. Chest and abdomen benign.  Disposition: Final discharge disposition not confirmed     Medication List    STOP taking these medications        naproxen sodium 220 MG tablet  Commonly known as:  ANAPROX      TAKE these medications        ALPHAGAN P 0.1 % Soln  Generic drug:  brimonidine  1 drop every 12 (twelve) hours.     ALPRAZolam 0.25 MG tablet  Commonly known as:  XANAX  TAKE 1 TABLET BY MOUTH THREE TIMES DAILY AS NEEDED FOR SLEEP     atorvastatin 40 MG tablet  Commonly known as:  LIPITOR  take 1 tablet by mouth once daily     butalbital-acetaminophen-caffeine 50-325-40 MG per tablet  Commonly known as:  FIORICET, ESGIC  TAKE 1 TABLET BY MOUTH TWICE DAILY AS NEEDED FOR HEADACHE     CALCIUM 600 + D PO  Take 1 tablet by mouth 2 (two) times daily.     conjugated estrogens vaginal cream  Commonly known as:  PREMARIN  Place vaginally daily.     dorzolamide-timolol 22.3-6.8 MG/ML ophthalmic solution  Commonly known as:  COSOPT  Place 1 drop into both eyes 2 (two) times daily.     esomeprazole 40 MG capsule  Commonly  known as:  NEXIUM  Take 1 capsule (40 mg total) by mouth daily before breakfast.     Fish Oil 1000 MG Caps  Take 1 capsule by mouth 2 (two) times daily.     gabapentin 300 MG capsule  Commonly known as:  NEURONTIN  1 capsule po qam; 2 capsules po in afternoon, 2 capsules po qhs     glucosamine-chondroitin 500-400 MG tablet  Take 1 tablet by mouth 2 (two) times daily.     hydrochlorothiazide 12.5 MG capsule  Commonly known as:  MICROZIDE  Take 1 capsule (12.5 mg total) by mouth daily.     HYDROcodone-acetaminophen 5-325 MG per tablet  Commonly known as:  NORCO  Take 1 tablet by mouth every 6 (six) hours as needed for moderate pain.     HYDROcodone-acetaminophen 10-325 MG per tablet  Commonly known as:  NORCO  Take 1-2 tablets by mouth every 4 (four) hours as needed for moderate pain.     Ketoprofen Powd  - Ketoprofen gel 20%  - aaa tid     latanoprost 0.005 % ophthalmic solution  Commonly known as:  XALATAN  Place 1 drop into both eyes at bedtime.     lisinopril 20 MG tablet  Commonly known as:  PRINIVIL,ZESTRIL  Take 1 tablet (20 mg total) by mouth daily.     loratadine 10 MG tablet  Commonly known as:  CLARITIN  Take 10 mg by mouth daily.     meloxicam 15 MG tablet  Commonly known as:  MOBIC  Take one pill daily with food for 7 days then prn     methocarbamol 500 MG tablet  Commonly known as:  ROBAXIN  Take 1 tablet (500 mg total) by mouth every 6 (six) hours as needed for muscle spasms.     multivitamin with minerals tablet  Take 1 tablet by mouth daily.     oxybutynin 10 MG 24 hr tablet  Commonly known as:  DITROPAN-XL  Take 1 tablet (10 mg total) by mouth daily.     sertraline 100 MG tablet  Commonly known as:  ZOLOFT  Take 1 tablet (100 mg total) by mouth daily.           Follow-up Information    Follow up with Tia AlertJONES,DAVID S, MD.   Specialty:  Neurosurgery   Contact information:   [address] [place]  [id number] [phone]       Signed: Temple PaciniOOL,Zaelynn Fuchs A 11/28/2014, 12:07 PM

## 2014-11-28 NOTE — Progress Notes (Signed)
Pt ambulated in room with brace and walker approx 30 feet. No acute distress noted. Pt tolerated well.   Andrew AuVafiadis, Emme Rosenau I 11/28/2014 3:36 PM

## 2014-11-28 NOTE — Progress Notes (Signed)
Pt transported out of unit per wheelchair with daughter at side with pt's personal belongings. No acute distress noted. Escorted out by nurse tech SheldonJasmine.   Andrew AuVafiadis, Ronny Korff I 11/28/2014 3:35 PM

## 2014-11-28 NOTE — Progress Notes (Signed)
OT Cancellation Note  Patient Details Name: Melissa LivingsCarol A Lanuza MRN: [id number] DOB: 1941-05-22   Cancelled Treatment:    Reason Eval/Treat Not Completed: Other (comment). Pt still does not have brace in room. Spoke with RN who reports that pt is sitting up in recliner and is wearing her sister's brace. RN plans to call MD regarding brace order. Pt has active orders to "apply brace in sitting." OT will hold until pt has appropriate brace to wear during mobility.   Nena JordanMiller, Farheen Pfahler M   Carney LivingLeeAnn Marie Kharlie Bring, OTR/L Occupational Therapist [phone] (pager)  11/28/2014, 11:14 AM

## 2014-11-29 NOTE — Progress Notes (Signed)
Utilization Review Completed.   Maurissa Ambrose, RN, BSN Nurse Case Manager  

## 2014-11-30 ENCOUNTER — Other Ambulatory Visit: Payer: Self-pay | Admitting: *Deleted

## 2014-11-30 ENCOUNTER — Encounter (HOSPITAL_COMMUNITY): Payer: Self-pay | Admitting: Neurological Surgery

## 2014-11-30 DIAGNOSIS — M4326 Fusion of spine, lumbar region: Secondary | ICD-10-CM | POA: Diagnosis not present

## 2014-11-30 MED FILL — Sodium Chloride IV Soln 0.9%: INTRAVENOUS | Qty: 1000 | Status: AC

## 2014-11-30 MED FILL — Heparin Sodium (Porcine) Inj 1000 Unit/ML: INTRAMUSCULAR | Qty: 30 | Status: AC
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
## 2014-12-01 MED ORDER — SERTRALINE HCL 100 MG PO TABS: 100.0000 mg | ORAL_TABLET | Freq: Every day | ORAL | Status: DC

## 2014-12-10 ENCOUNTER — Other Ambulatory Visit: Payer: Self-pay | Admitting: Family Medicine

## 2014-12-16 ENCOUNTER — Emergency Department (HOSPITAL_COMMUNITY): Payer: Medicare Other

## 2014-12-16 ENCOUNTER — Emergency Department (HOSPITAL_COMMUNITY)
Admission: EM | Admit: 2014-12-16 | Discharge: 2014-12-16 | Disposition: A | Discharge status: Home / Self Care | Payer: Medicare Other | Attending: Emergency Medicine | Admitting: Emergency Medicine

## 2014-12-16 ENCOUNTER — Encounter (HOSPITAL_COMMUNITY): Payer: Self-pay | Admitting: Emergency Medicine

## 2014-12-16 DIAGNOSIS — F419 Anxiety disorder, unspecified: Secondary | ICD-10-CM | POA: Diagnosis not present

## 2014-12-16 DIAGNOSIS — K219 Gastro-esophageal reflux disease without esophagitis: Secondary | ICD-10-CM | POA: Diagnosis not present

## 2014-12-16 DIAGNOSIS — Z79899 Other long term (current) drug therapy: Secondary | ICD-10-CM | POA: Diagnosis not present

## 2014-12-16 DIAGNOSIS — S0990XA Unspecified injury of head, initial encounter: Secondary | ICD-10-CM | POA: Diagnosis present

## 2014-12-16 DIAGNOSIS — F329 Major depressive disorder, single episode, unspecified: Secondary | ICD-10-CM | POA: Diagnosis not present

## 2014-12-16 DIAGNOSIS — Y998 Other external cause status: Secondary | ICD-10-CM | POA: Diagnosis not present

## 2014-12-16 DIAGNOSIS — Y9389 Activity, other specified: Secondary | ICD-10-CM | POA: Insufficient documentation

## 2014-12-16 DIAGNOSIS — I10 Essential (primary) hypertension: Secondary | ICD-10-CM | POA: Diagnosis not present

## 2014-12-16 DIAGNOSIS — W01198A Fall on same level from slipping, tripping and stumbling with subsequent striking against other object, initial encounter: Secondary | ICD-10-CM | POA: Insufficient documentation

## 2014-12-16 DIAGNOSIS — S0181XA Laceration without foreign body of other part of head, initial encounter: Secondary | ICD-10-CM | POA: Diagnosis not present

## 2014-12-16 DIAGNOSIS — S01111A Laceration without foreign body of right eyelid and periocular area, initial encounter: Secondary | ICD-10-CM | POA: Diagnosis not present

## 2014-12-16 DIAGNOSIS — W19XXXA Unspecified fall, initial encounter: Secondary | ICD-10-CM

## 2014-12-16 DIAGNOSIS — H409 Unspecified glaucoma: Secondary | ICD-10-CM | POA: Insufficient documentation

## 2014-12-16 DIAGNOSIS — Y9289 Other specified places as the place of occurrence of the external cause: Secondary | ICD-10-CM | POA: Diagnosis not present

## 2014-12-16 DIAGNOSIS — E785 Hyperlipidemia, unspecified: Secondary | ICD-10-CM | POA: Insufficient documentation

## 2014-12-16 MED ORDER — ALPRAZOLAM 0.25 MG PO TABS: 0.2500 mg | ORAL_TABLET | Freq: Two times a day (BID) | ORAL | Status: DC | PRN

## 2014-12-16 MED ORDER — LIDOCAINE-EPINEPHRINE (PF) 2 %-1:200000 IJ SOLN
10.0000 mL | Freq: Once | INTRAMUSCULAR | Status: AC
  Administered 2014-12-16: 10 mL
  Filled 2014-12-16: qty 20

## 2014-12-16 NOTE — Telephone Encounter (Signed)
Left voice message informing pt that Rx is ready for pick up.  Clovis PuMartin, Tamika L, RN

## 2014-12-16 NOTE — Addendum Note (Signed)
Addended by: Araceli BoucheUMLEY, Treynor N on: 12/16/2014 09:21 AM   Modules accepted: Orders

## 2014-12-16 NOTE — Telephone Encounter (Signed)
Initial prescription printed on plain paper instead of prescription paper; this was placed in shredder box. Reprinted on prescription paper and placed out front for pick up.

## 2014-12-16 NOTE — Discharge Instructions (Signed)
Facial Laceration °A facial laceration is a cut on the face. These injuries can be painful and cause bleeding. Some cuts may need to be closed with stitches (sutures), skin adhesive strips, or wound glue. Cuts usually heal quickly but can leave a scar. It can take 1-2 years for the scar to go away completely. °HOME CARE  °· Only take medicines as told by your doctor. °· Follow your doctor's instructions for wound care. °For Stitches: °· Keep the cut clean and dry. °· If you have a bandage (dressing), change it at least once a day. Change the bandage if it gets wet or dirty, or as told by your doctor. °· Wash the cut with soap and water 2 times a day. Rinse the cut with water. Pat it dry with a clean towel. °· Put a thin layer of medicated cream on the cut as told by your doctor. °· You may shower after the first 24 hours. Do not soak the cut in water until the stitches are removed. °· Have your stitches removed as told by your doctor. °· Do not wear any makeup until a few days after your stitches are removed. °For Skin Adhesive Strips: °· Keep the cut clean and dry. °· Do not get the strips wet. You may take a bath, but be careful to keep the cut dry. °· If the cut gets wet, pat it dry with a clean towel. °· The strips will fall off on their own. Do not remove the strips that are still stuck to the cut. °For Wound Glue: °· You may shower or take baths. Do not soak or scrub the cut. Do not swim. Avoid heavy sweating until the glue falls off on its own. After a shower or bath, pat the cut dry with a clean towel. °· Do not put medicine or makeup on your cut until the glue falls off. °· If you have a bandage, do not put tape over the glue. °· Avoid lots of sunlight or tanning lamps until the glue falls off. °· The glue will fall off on its own in 5-10 days. Do not pick at the glue. °After Healing: °Put sunscreen on the cut for the first year to reduce your scar. °GET HELP RIGHT AWAY IF:  °· Your cut area gets red,  painful, or puffy (swollen). °· You see a yellowish-white fluid (pus) coming from the cut. °· You have chills or a fever. °MAKE SURE YOU:  °· Understand these instructions. °· Will watch your condition. °· Will get help right away if you are not doing well or get worse. °Document Released: 04/10/2008 Document Revised: 08/13/2013 Document Reviewed: 06/05/2013 °ExitCare® Patient Information ©2015 [employer]. This information is not intended to replace advice given to you by your health care provider. Make sure you discuss any questions you have with your health care provider. ° °Fall Prevention and Home Safety °Falls cause injuries and can affect all age groups. It is possible to prevent falls.  °HOW TO PREVENT FALLS °· Wear shoes with rubber soles that do not have an opening for your toes. °· Keep the inside and outside of your house well lit. °· Use night lights throughout your home. °· Remove clutter from floors. °· Clean up floor spills. °· Remove throw rugs or fasten them to the floor with carpet tape. °· Do not place electrical cords across pathways. °· Put grab bars by your tub, shower, and toilet. Do not use towel bars as grab bars. °· Put   handrails on both sides of the stairway. Fix loose handrails. °· Do not climb on stools or stepladders, if possible. °· Do not wax your floors. °· Repair uneven or unsafe sidewalks, walkways, or stairs. °· Keep items you use a lot within reach. °· Be aware of pets. °· Keep emergency numbers next to the telephone. °· Put smoke detectors in your home and near bedrooms. °Ask your doctor what other things you can do to prevent falls. °Document Released: 08/19/2009 Document Revised: 04/23/2012 Document Reviewed: 01/23/2012 °ExitCare® Patient Information ©2015 [employer]. This information is not intended to replace advice given to you by your health care provider. Make sure you discuss any questions you have with your health care provider. ° °

## 2014-12-16 NOTE — ED Notes (Signed)
Pt sts she had a muscle spasm from her surgery site and lost balance falling onto the concrete. No loc. Pt presents with laceration to R side eye. Pt does not take blood thinners.

## 2014-12-16 NOTE — ED Provider Notes (Signed)
CSN: [id number]     Arrival date & time 12/16/14  1555 History  This chart was scribed for Terri Piedra, PA-C, working with Lyanne Co, MD by Chestine Spore, ED Scribe. The patient was seen in room TR09C/TR09C at 4:15 PM.    Chief Complaint  Patient presents with  . Fall  . Facial Laceration    The history is provided by the patient. No language interpreter was used.    HPI Comments: Melissa Gonzales is a 74 y.o. female who presents to the Emergency Department complaining of fall onset today PTA. She reports that she fell in the driveway because of her muscle pain and spasms. She reports that she hit her head. Her daughter reports that she was "loopy" for 15 minutes after the incident. She states that she is having associated symptoms of facial laceration. She states that she has tried vicodin at 11:30 AM with relief for her symptoms. She took the vicodin before her fall for any pain that may occur since her fusion surgery. She denies LOC, HA, neck pain, back pain, blurred vision, dizziness, tinnitus, and any other symptoms. She reports that she just had a fusion completed 11/27/14. She denies being on blood thinners. She does not remember when her last tetanus shot was.    Past Medical History  Diagnosis Date  . Anxiety   . GERD (gastroesophageal reflux disease)   . Hyperlipidemia   . Hypertension   . Depression   . Glaucoma    Past Surgical History  Procedure Laterality Date  . Cholecystectomy  1993  . Vaginal hysterectomy  1970s    uterine prolapse  . Eye surgery      Cataract Surgery Bilateral, 5 years ago  . Maximum access (mas)posterior lumbar interbody fusion (plif) 2 level N/A 11/27/2014    Procedure: LUMBAR THREE-FOUR, LUMBAR FOUR-FIVE MAXIMUM ACCESS POSTERIOR LUMBAR INTERBODY FUSION;  Surgeon: Tia Alert, MD;  Location: [facility];  Service: Neurosurgery;  Laterality: N/A;  L3-4 L4-5 MAXIMUM ACCESS POSTERIOR LUMBAR INTERBODY FUSION   Family History  Problem  Relation Age of Onset  . Mental illness Daughter   . Drug abuse Grandchild   . Heart disease Mother 64    died MI age 75  . Hyperlipidemia Sister   . Hypertension Sister   . Diabetes Neg Hx   . Cancer Father     Primary Cell Liver Cancer   History  Substance Use Topics  . Smoking status: Never Smoker   . Smokeless tobacco: Not on file  . Alcohol Use: 0.6 oz/week    1 Glasses of wine per week     Comment: 1 glass per night   OB History    No data available     Review of Systems  HENT: Negative for tinnitus.   Eyes: Negative for visual disturbance.  Musculoskeletal: Negative for back pain and neck pain.  Skin: Positive for wound.  Neurological: Negative for dizziness, syncope and headaches.  All other systems reviewed and are negative.     Allergies  Review of patient's allergies indicates no known allergies.  Home Medications   Prior to Admission medications   Medication Sig Start Date End Date Taking? Authorizing Provider  ALPHAGAN P 0.1 % SOLN 1 drop every 12 (twelve) hours.  04/05/12   Historical Provider, MD  ALPRAZolam Prudy Feeler) 0.25 MG tablet Take 1 tablet (0.25 mg total) by mouth 2 (two) times daily as needed for anxiety. 12/16/14   Araceli Bouche, DO  atorvastatin (LIPITOR) 40 MG tablet take 1 tablet by mouth once daily 06/26/14   [facility], DO  butalbital-acetaminophen-caffeine (FIORICET, ESGIC) [phone] MG per tablet TAKE 1 TABLET BY MOUTH TWICE DAILY AS NEEDED FOR HEADACHE 08/20/14   Pincus Large, DO  Calcium Carb-Cholecalciferol (CALCIUM 600 + D PO) Take 1 tablet by mouth 2 (two) times daily.    Historical Provider, MD  conjugated estrogens (PREMARIN) vaginal cream Place vaginally daily. Patient not taking: Reported on 11/18/2014 10/10/12   [place] Peaches, MD  dorzolamide-timolol (COSOPT) 22.3-6.8 MG/ML ophthalmic solution Place 1 drop into both eyes 2 (two) times daily.  03/20/11   Historical Provider, MD  esomeprazole (NEXIUM) 40 MG capsule  Take 1 capsule (40 mg total) by mouth daily before breakfast. 05/22/14   Lora Havens Rumley, DO  gabapentin (NEURONTIN) 300 MG capsule 1 capsule po qam; 2 capsules po in afternoon, 2 capsules po qhs Patient not taking: Reported on 11/18/2014 09/14/14   Lenda Kelp, MD  glucosamine-chondroitin 500-400 MG tablet Take 1 tablet by mouth 2 (two) times daily.    Historical Provider, MD  hydrochlorothiazide (MICROZIDE) 12.5 MG capsule Take 1 capsule (12.5 mg total) by mouth daily. 05/04/14   [place] Peaches, MD  HYDROcodone-acetaminophen (NORCO) 10-325 MG per tablet Take 1-2 tablets by mouth every 4 (four) hours as needed for moderate pain. 11/28/14   Temple Pacini, MD  HYDROcodone-acetaminophen (NORCO) 5-325 MG per tablet Take 1 tablet by mouth every 6 (six) hours as needed for moderate pain. 09/14/14   Lenda Kelp, MD  Ketoprofen POWD Ketoprofen gel 20% aaa tid Patient not taking: Reported on 11/18/2014 07/17/11   Enid Baas, MD  latanoprost (XALATAN) 0.005 % ophthalmic solution Place 1 drop into both eyes at bedtime.  06/21/14   Historical Provider, MD  lisinopril (PRINIVIL,ZESTRIL) 20 MG tablet Take 1 tablet (20 mg total) by mouth daily. 10/10/12   [place] Peaches, MD  loratadine (CLARITIN) 10 MG tablet Take 10 mg by mouth daily.      Historical Provider, MD  meloxicam (MOBIC) 15 MG tablet Take one pill daily with food for 7 days then prn Patient not taking: Reported on 11/18/2014 05/06/12   Ozzie Hoyle Draper, DO  methocarbamol (ROBAXIN) 500 MG tablet Take 1 tablet (500 mg total) by mouth every 6 (six) hours as needed for muscle spasms. 11/28/14   Temple Pacini, MD  Multiple Vitamins-Minerals (MULTIVITAMIN WITH MINERALS) tablet Take 1 tablet by mouth daily.    Historical Provider, MD  Omega-3 Fatty Acids (FISH OIL) 1000 MG CAPS Take 1 capsule by mouth 2 (two) times daily.    Historical Provider, MD  oxybutynin (DITROPAN-XL) 10 MG 24 hr tablet Take 1 tablet (10 mg total) by mouth daily.  05/12/13   Day[place] Peaches, MD  sertraline (ZOLOFT) 100 MG tablet Take 1 tablet (100 mg total) by mouth daily. 12/01/14   [place] Rumley, DO   BP 131/75 mmHg  Pulse 77  Temp(Src) 97.5 F (36.4 C) (Oral)  Resp 16  Ht  (1.626 m)  Wt 149 lb (67.586 kg)  BMI 25.56 kg/m2  SpO2 93% Physical Exam  Constitutional: She is oriented to person, place, and time. She appears well-developed and well-nourished. No distress.  HENT:  Head: Normocephalic. Head is with laceration.  Nose: Nose normal.  Mouth/Throat: Oropharynx is clear and moist.  2 cm curvilinear laceration noted over the right eyebrow. Bleeding is well  controlled. No appreciable foreign bodies.   Eyes: EOM are normal. Pupils are equal, round, and reactive to light.  Neck: Neck supple. No tracheal deviation present.  Cardiovascular: Normal rate and regular rhythm.  Exam reveals no gallop and no friction rub.   No murmur heard. Pulmonary/Chest: Effort normal and breath sounds normal. No respiratory distress.  Musculoskeletal: Normal range of motion.  Neurological: She is alert and oriented to person, place, and time. No cranial nerve deficit or sensory deficit.  No cranial nerves deficit. Nl strength and sensation intact in both the upper and lower extremities.   Skin: Skin is warm and dry.  Psychiatric: She has a normal mood and affect. Her behavior is normal.  Nursing note and vitals reviewed.   ED Course  Procedures (including critical care time) DIAGNOSTIC STUDIES: Oxygen Saturation is 93% on room air, low by my interpretation.    LACERATION REPAIR Performed by: Eben Burow Authorized by: Terri Piedra A Consent: Verbal consent obtained. Risks and benefits: risks, benefits and alternatives were discussed Consent given by: patient Patient identity confirmed: provided demographic data Prepped and Draped in normal sterile fashion Wound explored  Laceration Location: right eye brow  Laceration  Length: 2 cm  No Foreign Bodies seen or palpated  Anesthesia: local infiltration  Local anesthetic: lidocaine 2% w epinephrine  Anesthetic total: 4 ml  Irrigation method: syringe Amount of cleaning: standard  Skin closure: 5-0 ethilon  Number of sutures: 3  Technique: simple interrupted, close approximation  Patient tolerance: Patient tolerated the procedure well with no immediate complications.  COORDINATION OF CARE: 4:23 PM-Discussed treatment plan which includes Head CT, laceration repair, F/U for removal of sutures with pt at bedside and pt agreed to plan.   Labs Review Labs Reviewed - No data to display  Imaging Review Ct Head Wo Contrast  12/16/2014   CLINICAL DATA:  Patient fell and hit right orbit area with laceration  EXAM: CT HEAD WITHOUT CONTRAST  TECHNIQUE: Contiguous axial images were obtained from the base of the skull through the vertex without intravenous contrast.  COMPARISON:  Brain MRI 05/27/2014  FINDINGS: No acute intracranial hemorrhage. No focal mass lesion. No CT evidence of acute infarction. No midline shift or mass effect. No hydrocephalus. Basilar cisterns are patent.  There is mild skin thickening lateral to the right orbit. No fracture of the orbit. The globe is normal.  Generalized cortical atrophy. There is mild periventricular subcortical white matter hypodensities.  Paranasal sinuses and  mastoid air cells are clear.  IMPRESSION: 1. No acute intracranial findings. 2. Atrophy and mild microvascular disease. 3. No evidence of trauma.  No orbital fracture   Electronically Signed   By: Genevive Bi M.D.   On: 12/16/2014 17:07     EKG Interpretation None      MDM   Final diagnoses:  Fall, initial encounter  Eyebrow laceration, right, initial encounter   Patient is a 74 year old female who presents emergency room for evaluation after a fall. Patient did obtain a laceration to the right eyebrow. Laceration was repaired as seen above. Patient  has no focal neurological deficits on examination. Head CT is negative. Patient's tetanus is up-to-date. Patient is stable to be discharged home to have her sutures removed in 3-5 days by her PCP. Patient is stable for discharge. Patient to return for signs of infection, changes in baseline behavior, or any other concerning symptoms. Patient states understanding and agreement at this time. Patient seen by and discussed with Dr. Patria Mane who  agrees with the above workup and plan.  I personally performed the services described in this documentation, which was scribed in my presence. The recorded information has been reviewed and is accurate.    Eben Burowourtney A Forcucci, PA-C 12/16/14 1803  Lyanne CoKevin M Campos, MD 12/16/14 [phone]

## 2014-12-18 DIAGNOSIS — Z981 Arthrodesis status: Secondary | ICD-10-CM | POA: Diagnosis not present

## 2014-12-18 DIAGNOSIS — M4316 Spondylolisthesis, lumbar region: Secondary | ICD-10-CM | POA: Diagnosis not present

## 2014-12-20 ENCOUNTER — Emergency Department (HOSPITAL_COMMUNITY)
Admission: EM | Admit: 2014-12-20 | Discharge: 2014-12-20 | Discharge status: Absent without leave | Payer: Self-pay | Source: Home / Self Care

## 2014-12-20 ENCOUNTER — Emergency Department (HOSPITAL_COMMUNITY)
Admission: EM | Admit: 2014-12-20 | Discharge: 2014-12-20 | Disposition: A | Discharge status: Home / Self Care | Payer: Medicare Other | Source: Home / Self Care | Attending: Emergency Medicine | Admitting: Emergency Medicine

## 2014-12-20 DIAGNOSIS — Z4802 Encounter for removal of sutures: Secondary | ICD-10-CM | POA: Diagnosis not present

## 2014-12-20 DIAGNOSIS — IMO0002 Reserved for concepts with insufficient information to code with codable children: Secondary | ICD-10-CM

## 2014-12-20 NOTE — ED Provider Notes (Signed)
   Chief Complaint   Suture / Staple Removal   History of Present Illness   Melissa Gonzales is a [age] year old female who fell 5 days ago, striking her face on the concrete driveway. She sustained a small laceration of the right inferolateral orbital rim. This was sutured in the emergency room. CT scan of the face and cranium were normal with no fractures or intercranial injuries. Ever since then she developed a periorbital hematoma and she denies any visual symptoms. She's had no headache, neurological symptoms, nausea, or vomiting.  Review of Systems   Other than as noted above, the patient denies any of the following symptoms: Systemic:  No fevers, chills, sweats, weight loss or gain, fatigue, or tiredness.  PMFSH   Past medical history, family history, social history, meds, and allergies were reviewed.    Physical Examination    Vital signs:  BP 163/84 mmHg  Pulse 68  Temp(Src) 98.4 F (36.9 C) (Oral)  Resp 16  SpO2 100% General:  Alert and oriented.  In no distress.  Skin warm and dry. HEENT: She has a right periorbital hematoma. She also has a small sub-conjunctival hematoma as well. PERRLA, full EOMs. No pain to palpation over orbital rims. There is a well-healed laceration of the right inferolateral lateral orbital rim. There is no evidence of infection.     Course in Urgent Care Center   Wound was prepped with alcohol and sutures are removed. Patient was instructed in wound care.  Assessment   The encounter diagnosis was Laceration.  Plan   1.  Meds:  The following meds were prescribed:   Discharge Medication List as of 12/20/2014  1:57 PM      2.  Patient Education/Counseling:  The patient was given appropriate handouts, self care instructions, and instructed in symptomatic relief.    3.  Follow up:  The patient was told to follow up here if any evidence of infection, or new or changing neurological or visual symptoms.     Reuben Likesavid C Carisa Backhaus, MD 12/20/14  [phone]

## 2014-12-20 NOTE — ED Notes (Signed)
Patient here for removal of sutures over her right eye

## 2014-12-20 NOTE — Discharge Instructions (Signed)
Wash with soap and water, apply antibiotic ointment for next 2 to 3 days.

## 2014-12-30 ENCOUNTER — Telehealth: Payer: Self-pay | Admitting: Family Medicine

## 2014-12-31 ENCOUNTER — Other Ambulatory Visit: Payer: Self-pay | Admitting: *Deleted

## 2014-12-31 DIAGNOSIS — I10 Essential (primary) hypertension: Secondary | ICD-10-CM

## 2014-12-31 MED ORDER — LISINOPRIL 20 MG PO TABS: 20.0000 mg | ORAL_TABLET | Freq: Every day | ORAL | Status: DC

## 2015-01-04 MED ORDER — ALPRAZOLAM 0.25 MG PO TABS: 0.2500 mg | ORAL_TABLET | Freq: Two times a day (BID) | ORAL | Status: DC | PRN

## 2015-01-04 NOTE — Addendum Note (Signed)
Addended by: Araceli BoucheUMLEY, Lag[place] N on: 01/04/2015 05:22 PM   Modules accepted: Orders

## 2015-01-04 NOTE — Telephone Encounter (Signed)
Refill given and left at front desk. Please schedule an appointment to monitor effectiveness of anxiety medication prior to next refill.

## 2015-01-04 NOTE — Telephone Encounter (Signed)
Pt requested refill on xanax.  The refill on 2/10 was for 25 tablets and the RX is for 2 pills per day. Please advise

## 2015-01-05 NOTE — Telephone Encounter (Signed)
LMOVM for pt to return call .Melissa Gonzales  

## 2015-01-11 DIAGNOSIS — M4316 Spondylolisthesis, lumbar region: Secondary | ICD-10-CM | POA: Diagnosis not present

## 2015-01-18 ENCOUNTER — Other Ambulatory Visit: Payer: Self-pay | Admitting: *Deleted

## 2015-01-18 ENCOUNTER — Ambulatory Visit (INDEPENDENT_AMBULATORY_CARE_PROVIDER_SITE_OTHER): Payer: Medicare Other | Admitting: Family Medicine

## 2015-01-18 VITALS — BP 149/77 | HR 84 | Temp 97.7°F | Ht 64.0 in | Wt 149.0 lb

## 2015-01-18 DIAGNOSIS — I10 Essential (primary) hypertension: Secondary | ICD-10-CM

## 2015-01-18 DIAGNOSIS — F411 Generalized anxiety disorder: Secondary | ICD-10-CM | POA: Diagnosis not present

## 2015-01-18 MED ORDER — LISINOPRIL 20 MG PO TABS: 20.0000 mg | ORAL_TABLET | Freq: Every day | ORAL | Status: DC

## 2015-01-18 MED ORDER — ALPRAZOLAM 0.25 MG PO TABS: 0.2500 mg | ORAL_TABLET | Freq: Two times a day (BID) | ORAL | Status: DC | PRN

## 2015-01-18 NOTE — Progress Notes (Signed)
Subjective:     Patient ID: Melissa Gonzales, female   DOB: [birth year], [age] y.o.   MRN: [id number]  HPI Mrs. Melissa Gonzales is a 74yo female presenting today to discuss anxiety for refill of medications. - Currently taking Zoloft 100mg  daily and Xanax 0.25mg  PRN - States she does not usually take Xanax every day, however the last week has been stressful with her family - Has long history of her problems with daughter and granddaughter that serve as stressors - Has psychologist. States she has been twice this year and is going to schedule another appointment to be seen - No side effects from medication noted - States medication is working well for her. Does not wish to change medication at this time - Recently hospitalized for fusion. Also recent trip to ED following fall  Review of Systems  Psychiatric/Behavioral: Negative for suicidal ideas. The patient is nervous/anxious.        Objective:   Physical Exam  Constitutional: She appears well-developed and well-nourished. No distress.  Cardiovascular: Normal rate and regular rhythm.  Exam reveals no gallop and no friction rub.   No murmur heard. Pulmonary/Chest: Effort normal. No respiratory distress. She has no wheezes. She has no rales.  Abdominal: Soft. She exhibits no distension. There is no tenderness.  Musculoskeletal: She exhibits no edema.  Skin: Skin is warm. No rash noted.  Psychiatric: She has a normal mood and affect. Her behavior is normal. Judgment and thought content normal.       Assessment:     Please refer to Problem List for Assessment.    Plan:     Please refer to Problem List for Plan.

## 2015-01-18 NOTE — Patient Instructions (Signed)
Thank you so much for coming to visit me today! We will continue your current medications for anxiety. I will give you a refill for Xanax today.  Please let me know if there's anything I can do for you!  Thanks again and I'll be praying for you and your family! Dr. Caroleen Hammanumley

## 2015-01-18 NOTE — Assessment & Plan Note (Signed)
-   Continue current medication regimen. Xanax 0.25mg  PRN and Zoloft 100mg  daily. Discussed only taking Xanax when necessary.  - Refill of Xanax given - GAD 7 score 9 - Encouraged use of psychologist. States she is going to schedule an appointment to be seen

## 2015-02-01 DIAGNOSIS — M79604 Pain in right leg: Secondary | ICD-10-CM | POA: Diagnosis not present

## 2015-02-04 ENCOUNTER — Other Ambulatory Visit: Payer: Self-pay | Admitting: Neurosurgery

## 2015-02-04 DIAGNOSIS — M79604 Pain in right leg: Secondary | ICD-10-CM

## 2015-02-15 ENCOUNTER — Other Ambulatory Visit: Payer: Self-pay

## 2015-02-15 DIAGNOSIS — Z1231 Encounter for screening mammogram for malignant neoplasm of breast: Secondary | ICD-10-CM

## 2015-02-18 ENCOUNTER — Ambulatory Visit
Admission: RE | Admit: 2015-02-18 | Discharge: 2015-02-18 | Disposition: A | Discharge status: Home / Self Care | Payer: Medicare Other | Source: Ambulatory Visit | Attending: Neurosurgery | Admitting: Neurosurgery

## 2015-02-18 VITALS — BP 137/75 | HR 81

## 2015-02-18 DIAGNOSIS — M79604 Pain in right leg: Secondary | ICD-10-CM

## 2015-02-18 DIAGNOSIS — T84216A Breakdown (mechanical) of internal fixation device of vertebrae, initial encounter: Secondary | ICD-10-CM | POA: Diagnosis not present

## 2015-02-18 DIAGNOSIS — M4806 Spinal stenosis, lumbar region: Secondary | ICD-10-CM | POA: Diagnosis not present

## 2015-02-18 DIAGNOSIS — Z981 Arthrodesis status: Secondary | ICD-10-CM

## 2015-02-18 MED ORDER — DIAZEPAM 5 MG PO TABS
5.0000 mg | ORAL_TABLET | Freq: Once | ORAL | Status: AC
  Administered 2015-02-18: 5 mg via ORAL

## 2015-02-18 MED ORDER — IOHEXOL 180 MG/ML  SOLN
15.0000 mL | Freq: Once | INTRAMUSCULAR | Status: AC | PRN
  Administered 2015-02-18: 15 mL via INTRATHECAL

## 2015-02-18 NOTE — Progress Notes (Signed)
Patient states she has been off Zoloft for the past two days.  jkl

## 2015-02-18 NOTE — Discharge Instructions (Signed)
Myelogram Discharge Instructions  1. Go home and rest quietly for the next 24 hours.  It is important to lie flat for the next 24 hours.  Get up only to go to the restroom.  You may lie in the bed or on a couch on your back, your stomach, your left side or your right side.  You may have one pillow under your head.  You may have pillows between your knees while you are on your side or under your knees while you are on your back.  2. DO NOT drive today.  Recline the seat as far back as it will go, while still wearing your seat belt, on the way home.  3. You may get up to go to the bathroom as needed.  You may sit up for 10 minutes to eat.  You may resume your normal diet and medications unless otherwise indicated.  Drink plenty of extra fluids today and tomorrow.  4. The incidence of a spinal headache with nausea and/or vomiting is about 5% (one in 20 patients).  If you develop a headache, lie flat and drink plenty of fluids until the headache goes away.  Caffeinated beverages may be helpful.  If you develop severe nausea and vomiting or a headache that does not go away with flat bed rest, call [phone].  5. You may resume normal activities after your 24 hours of bed rest is over; however, do not exert yourself strongly or do any heavy lifting tomorrow.  6. Call your physician for a follow-up appointment.   You may resume Zoloft/Sertraline on Friday, February 19, 2015 after 3:00p.m.

## 2015-02-24 ENCOUNTER — Other Ambulatory Visit: Payer: Self-pay | Admitting: Neurological Surgery

## 2015-03-01 DIAGNOSIS — R350 Frequency of micturition: Secondary | ICD-10-CM | POA: Diagnosis not present

## 2015-03-11 ENCOUNTER — Ambulatory Visit (INDEPENDENT_AMBULATORY_CARE_PROVIDER_SITE_OTHER): Payer: Medicare Other | Admitting: Family Medicine

## 2015-03-11 ENCOUNTER — Encounter: Payer: Self-pay | Admitting: Family Medicine

## 2015-03-11 ENCOUNTER — Ambulatory Visit (HOSPITAL_BASED_OUTPATIENT_CLINIC_OR_DEPARTMENT_OTHER)
Admission: RE | Admit: 2015-03-11 | Discharge: 2015-03-11 | Disposition: A | Discharge status: Home / Self Care | Payer: Medicare Other | Source: Ambulatory Visit | Attending: Family Medicine | Admitting: Family Medicine

## 2015-03-11 VITALS — BP 153/85 | HR 80 | Ht 65.0 in | Wt 148.0 lb

## 2015-03-11 DIAGNOSIS — M5441 Lumbago with sciatica, right side: Secondary | ICD-10-CM

## 2015-03-11 DIAGNOSIS — M25551 Pain in right hip: Secondary | ICD-10-CM | POA: Diagnosis not present

## 2015-03-12 ENCOUNTER — Ambulatory Visit
Admission: RE | Admit: 2015-03-12 | Discharge: 2015-03-12 | Disposition: A | Discharge status: Home / Self Care | Payer: Medicare Other | Source: Ambulatory Visit

## 2015-03-12 DIAGNOSIS — Z1231 Encounter for screening mammogram for malignant neoplasm of breast: Secondary | ICD-10-CM | POA: Diagnosis not present

## 2015-03-13 NOTE — Pre-Procedure Instructions (Signed)
Melissa Gonzales  03/13/2015   Your procedure is scheduled on:  May 11  [facility] at 10:00 AM.  Call this number if you have problems the morning of surgery: [phone]   Remember:   Do not eat food or drink liquids after midnight.   Take these medicines the morning of surgery with A SIP OF WATER: Eye Drops, Xanax (if needed), Floricet, Nexium, Loratadine, Oxybutynin, Oxycodone (if needed), Zoloft,   STOP Fish Oil, Naproxen, Multiple Vitamins, Meloxicam, Glucosamine- Chondroitin, Calcium today   STOP/ Do not take Aspirin, Aleve, Naproxen, Advil, Ibuprofen, Motrin, Vitamins, Herbs, or Supplements starting today   Do not wear jewelry, make-up or nail polish.  Do not wear lotions, powders, or perfumes. You may wear deodorant.  Do not shave 48 hours prior to surgery. Men may shave face and neck.  Do not bring valuables to the hospital.  [facility] for any belongings or valuables.               Contacts, dentures or bridgework may not be worn into surgery.  Leave suitcase in the car. After surgery it may be brought to your room.  For patients admitted to the hospital, discharge time is determined by your treatment team.               Special Instructions: [facility] - Preparing for Surgery  Before surgery, you can play an important role.  Because skin is not sterile, your skin needs to be as free of germs as possible.  You can reduce the number of germs on you skin by washing with CHG (chlorahexidine gluconate) soap before surgery.  CHG is an antiseptic cleaner which kills germs and bonds with the skin to continue killing germs even after washing.  Please DO NOT use if you have an allergy to CHG or antibacterial soaps.  If your skin becomes reddened/irritated stop using the CHG and inform your nurse when you arrive at Short Stay.  Do not shave (including legs and underarms) for at least 48 hours prior to the first CHG shower.  You may  shave your face.  Please follow these instructions carefully:   1.  Shower with CHG Soap the night before surgery and the morning of Surgery.  2.  If you choose to wash your hair, wash your hair first as usual with your normal shampoo.  3.  After you shampoo, rinse your hair and body thoroughly to remove the shampoo.  4.  Use CHG as you would any other liquid soap.  You can apply CHG directly to the skin and wash gently with scrungie or a clean washcloth.  5.  Apply the CHG Soap to your body ONLY FROM THE NECK DOWN.  Do not use on open wounds or open sores.  Avoid contact with your eyes, ears, mouth and genitals (private parts).  Wash genitals (private parts) with your normal soap.  6.  Wash thoroughly, paying special attention to the area where your surgery will be performed.  7.  Thoroughly rinse your body with warm water from the neck down.  8.  DO NOT shower/wash with your normal soap after using and rinsing off the CHG Soap.  9.  Pat yourself dry with a clean towel.            10.  Wear clean pajamas.            11.  Place clean sheets on your bed  the night of your first shower and do not sleep with pets.  Day of Surgery  Do not apply any lotions the morning of surgery.  Please wear clean clothes to the hospital/surgery center.     Please read over the following fact sheets that you were given: Pain Booklet, Coughing and Deep Breathing, Blood Transfusion Information and Surgical Site Infection Prevention

## 2015-03-15 ENCOUNTER — Encounter (HOSPITAL_COMMUNITY)
Admission: RE | Admit: 2015-03-15 | Discharge: 2015-03-15 | Disposition: A | Discharge status: Home / Self Care | Payer: Medicare Other | Source: Ambulatory Visit | Attending: Neurological Surgery | Admitting: Neurological Surgery

## 2015-03-15 ENCOUNTER — Encounter (HOSPITAL_COMMUNITY): Payer: Self-pay

## 2015-03-15 DIAGNOSIS — F329 Major depressive disorder, single episode, unspecified: Secondary | ICD-10-CM | POA: Diagnosis not present

## 2015-03-15 DIAGNOSIS — M4316 Spondylolisthesis, lumbar region: Secondary | ICD-10-CM | POA: Insufficient documentation

## 2015-03-15 DIAGNOSIS — Z01812 Encounter for preprocedural laboratory examination: Secondary | ICD-10-CM | POA: Insufficient documentation

## 2015-03-15 DIAGNOSIS — K219 Gastro-esophageal reflux disease without esophagitis: Secondary | ICD-10-CM | POA: Diagnosis not present

## 2015-03-15 DIAGNOSIS — E785 Hyperlipidemia, unspecified: Secondary | ICD-10-CM | POA: Diagnosis not present

## 2015-03-15 DIAGNOSIS — M4806 Spinal stenosis, lumbar region: Secondary | ICD-10-CM | POA: Diagnosis not present

## 2015-03-15 DIAGNOSIS — I10 Essential (primary) hypertension: Secondary | ICD-10-CM | POA: Diagnosis not present

## 2015-03-15 DIAGNOSIS — F419 Anxiety disorder, unspecified: Secondary | ICD-10-CM | POA: Diagnosis not present

## 2015-03-15 DIAGNOSIS — H409 Unspecified glaucoma: Secondary | ICD-10-CM | POA: Diagnosis not present

## 2015-03-15 DIAGNOSIS — T84038A Mechanical loosening of other internal prosthetic joint, initial encounter: Secondary | ICD-10-CM | POA: Diagnosis not present

## 2015-03-15 DIAGNOSIS — S32059A Unspecified fracture of fifth lumbar vertebra, initial encounter for closed fracture: Secondary | ICD-10-CM | POA: Diagnosis not present

## 2015-03-15 DIAGNOSIS — M4317 Spondylolisthesis, lumbosacral region: Secondary | ICD-10-CM | POA: Diagnosis not present

## 2015-03-15 HISTORY — DX: Diverticulosis of intestine, part unspecified, without perforation or abscess without bleeding: K57.90

## 2015-03-15 HISTORY — DX: Unspecified osteoarthritis, unspecified site: M19.90

## 2015-03-15 HISTORY — DX: Urgency of urination: R39.15

## 2015-03-15 HISTORY — DX: Pneumonia, unspecified organism: J18.9

## 2015-03-15 LAB — BASIC METABOLIC PANEL
ANION GAP: 11 (ref 5–15)
BUN: 22 mg/dL — ABNORMAL HIGH (ref 6–20)
CALCIUM: 9.6 mg/dL (ref 8.9–10.3)
CO2: 25 mmol/L (ref 22–32)
CREATININE: 0.71 mg/dL (ref 0.44–1.00)
Chloride: 102 mmol/L (ref 101–111)
Glucose, Bld: 116 mg/dL — ABNORMAL HIGH (ref 70–99)
Potassium: 3.9 mmol/L (ref 3.5–5.1)
SODIUM: 138 mmol/L (ref 135–145)

## 2015-03-15 LAB — CBC
HEMATOCRIT: 42.4 % (ref 36.0–46.0)
Hemoglobin: 14.2 g/dL (ref 12.0–15.0)
MCH: 31.9 pg (ref 26.0–34.0)
MCHC: 33.5 g/dL (ref 30.0–36.0)
MCV: 95.3 fL (ref 78.0–100.0)
Platelets: 244 10*3/uL (ref 150–400)
RBC: 4.45 MIL/uL (ref 3.87–5.11)
RDW: 13.1 % (ref 11.5–15.5)
WBC: 7.4 10*3/uL (ref 4.0–10.5)

## 2015-03-15 LAB — TYPE AND SCREEN
ABO/RH(D): A POS
ANTIBODY SCREEN: NEGATIVE

## 2015-03-15 LAB — SURGICAL PCR SCREEN
MRSA, PCR: NEGATIVE
Staphylococcus aureus: POSITIVE — AB

## 2015-03-15 NOTE — Assessment & Plan Note (Signed)
with radiation into right lower extremity.  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  Radiographs of hip negative, reassuring.  Encouraged her to return to neurosurgery, have procedure they outlined which hopefully will help with her pain.

## 2015-03-15 NOTE — Progress Notes (Signed)
Patient ID: Melissa Gonzales, female   DOB: 06-02-1941, 74 y.o.   MRN: [id number]  PCP: Araceli Bouche, DO  Subjective:   HPI: Patient is a 74 y.o. female here for back/right leg pain.  10/21: Patient reports she has known history of severe spinal stenosis. Had MRI back in July 2013 which confirmed this. She states 2 years ago she did physical therapy and pilates which helped. Also did dry needling and pain resolved. Since this spring pain has been slowly coming back in same area. Radiates into right more than left leg. Right leg gave out and she fell onto right shoulder recently. No bowel/bladder dysfunction.  09/14/14: Patient reports her right shoulder has improved about 60% - pain with some movements but overall doing better. Using heat, taking norco as needed but more for her back. Doing home exercises. Low back pain still severe at 7/10 level. Taking gabapentin, norco. Doing physical therapy and home exercises. No bowel/bladder dysfunction. Still going into both legs.  03/11/15: Patient returns s/p lumbar fusion on 11/07/14. Has struggled with low back, right hip pain since about early February. Discovered on imaging she had a fracture that may be contributing to radiculopathy into right leg. She is scheduled to have surgery in about a week - reports this is to remove hardware near the fracture and fuse additional level where she has stenosis. She wanted to make sure she doesn't have any hip pathology in addition to this.  Past Medical History  Diagnosis Date  . Anxiety   . GERD (gastroesophageal reflux disease)   . Hyperlipidemia   . Hypertension   . Depression   . Glaucoma     Bilateral eyes  . Pneumonia     hx of  . Urgency of urination   . Diverticulosis   . Arthritis     Current Outpatient Prescriptions on File Prior to Visit  Medication Sig Dispense Refill  . ALPHAGAN P 0.1 % SOLN Place 1 drop into both eyes every 12 (twelve) hours.     . ALPRAZolam  (XANAX) 0.25 MG tablet Take 1 tablet (0.25 mg total) by mouth 2 (two) times daily as needed for anxiety. Do not take if not needed for anxiety. 30 tablet 3  . atorvastatin (LIPITOR) 40 MG tablet take 1 tablet by mouth once daily 90 tablet 3  . butalbital-acetaminophen-caffeine (FIORICET, ESGIC) 50-325-40 MG per tablet TAKE 1 TABLET BY MOUTH TWICE DAILY AS NEEDED FOR HEADACHE 30 tablet 0  . Calcium Carb-Cholecalciferol (CALCIUM 600 + D PO) Take 1 tablet by mouth 2 (two) times daily.    Marland Kitchen conjugated estrogens (PREMARIN) vaginal cream Place vaginally daily. (Patient not taking: Reported on 11/18/2014) 42.5 g 12  . dorzolamide-timolol (COSOPT) 22.3-6.8 MG/ML ophthalmic solution Place 1 drop into both eyes 2 (two) times daily.     Marland Kitchen esomeprazole (NEXIUM) 40 MG capsule Take 1 capsule (40 mg total) by mouth daily before breakfast. 90 capsule 3  . FLUOCINOLONE ACETONIDE SCALP 0.01 % OIL Apply 1 application topically daily as needed.   3  . gabapentin (NEURONTIN) 300 MG capsule 1 capsule po qam; 2 capsules po in afternoon, 2 capsules po qhs (Patient not taking: Reported on 11/18/2014) 150 capsule 1  . glucosamine-chondroitin 500-400 MG tablet Take 1 tablet by mouth 2 (two) times daily.    . hydrochlorothiazide (MICROZIDE) 12.5 MG capsule Take 1 capsule (12.5 mg total) by mouth daily. 90 capsule 3  . HYDROcodone-acetaminophen (NORCO) 10-325 MG per tablet Take 1-2 tablets by  mouth every 4 (four) hours as needed for moderate pain. (Patient not taking: Reported on 03/09/2015) 80 tablet 0  . HYDROcodone-acetaminophen (NORCO) 5-325 MG per tablet Take 1 tablet by mouth every 6 (six) hours as needed for moderate pain. (Patient not taking: Reported on 03/09/2015) 60 tablet 0  . Ketoprofen POWD Ketoprofen gel 20% aaa tid (Patient not taking: Reported on 11/18/2014) 60 g 12  . latanoprost (XALATAN) 0.005 % ophthalmic solution Place 1 drop into both eyes at bedtime.     Marland Kitchen. lisinopril (PRINIVIL,ZESTRIL) 20 MG tablet Take 1 tablet  (20 mg total) by mouth daily. 90 tablet 3  . loratadine (CLARITIN) 10 MG tablet Take 10 mg by mouth daily as needed for allergies.     . meloxicam (MOBIC) 15 MG tablet Take one pill daily with food for 7 days then prn (Patient not taking: Reported on 11/18/2014) 40 tablet 0  . methocarbamol (ROBAXIN) 500 MG tablet Take 1 tablet (500 mg total) by mouth every 6 (six) hours as needed for muscle spasms. (Patient not taking: Reported on 03/09/2015) 50 tablet 0  . Multiple Vitamins-Minerals (MULTIVITAMIN WITH MINERALS) tablet Take 1 tablet by mouth daily.    . naproxen sodium (ANAPROX) 220 MG tablet Take 440 mg by mouth 2 (two) times daily with a meal.    . Omega-3 Fatty Acids (FISH OIL) 1000 MG CAPS Take 1 capsule by mouth 2 (two) times daily.    Marland Kitchen. oxybutynin (DITROPAN-XL) 10 MG 24 hr tablet Take 1 tablet (10 mg total) by mouth daily. 30 tablet 6  . Oxycodone HCl 10 MG TABS Take 1 tablet by mouth every 4 (four) hours as needed (Pt prefers Twice daily as needed).   0  . sertraline (ZOLOFT) 100 MG tablet Take 1 tablet (100 mg total) by mouth daily. 90 tablet 3  . triamcinolone (KENALOG) 0.025 % cream Apply 1 application topically 2 (two) times daily as needed.     No current facility-administered medications on file prior to visit.    Past Surgical History  Procedure Laterality Date  . Cholecystectomy  1993  . Vaginal hysterectomy  1970s    uterine prolapse  . Maximum access (mas)posterior lumbar interbody fusion (plif) 2 level N/A 11/27/2014    Procedure: LUMBAR THREE-FOUR, LUMBAR FOUR-FIVE MAXIMUM ACCESS POSTERIOR LUMBAR INTERBODY FUSION;  Surgeon: Tia Alertavid S Jones, MD;  Location: [facility];  Service: Neurosurgery;  Laterality: N/A;  L3-4 L4-5 MAXIMUM ACCESS POSTERIOR LUMBAR INTERBODY FUSION  . Eye surgery      Cataract Surgery Bilateral, 5 years ago  . Colonoscopy      No Known Allergies  History   Social History  . Marital Status: Widowed    Spouse Name: N/A  . Number of Children: 3  .  Years of Education: N/A   Occupational History  .     Social History Main Topics  . Smoking status: Never Smoker   . Smokeless tobacco: Not on file  . Alcohol Use: 0.6 oz/week    1 Glasses of wine per week     Comment: 1 glass per night  . Drug Use: No  . Sexual Activity: No   Other Topics Concern  . Not on file   Social History Narrative   Husband died suicide age 6640s.  Currently in long term romantic relationship.  Support from 2 daughters and good relationship with grandchildren.  Other daughter has mental health issues, strained relationship.  Pt feels she is not making good decisions re: children and  has tried to get CPS involved without relief.   1/3/13Lucila Maine: Grandson (of daughter with mental health issues) facing jail time for selling drugs.  Pt has tried to support him and offered help with rehab or with changing colleges, but he refuses.      Family History  Problem Relation Age of Onset  . Mental illness Daughter   . Drug abuse Grandchild   . Heart disease Mother [id number]    died MI age 75  . Hyperlipidemia Sister   . Hypertension Sister   . Diabetes Neg Hx   . Cancer Father     Primary Cell Liver Cancer    BP 153/85 mmHg  Pulse 80  Ht 5\' 5"  (1.651 m)  Wt 148 lb (67.132 kg)  BMI 24.63 kg/m2  Review of Systems: See HPI above.    Objective:  Physical Exam:  Gen: NAD  Back/ R hip: No gross deformity, scoliosis. TTP right lumbar paraspinal region, buttock.  No midline or bony TTP. FROM with pain on extension.  No pain hip ROM. Strength LEs 5/5 all muscle groups.   Negative SLRs. Negative logroll bilateral hips. Negative fabers.    Assessment & Plan:  1. Low back pain - with radiation into right lower extremity.  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  Radiographs of hip negative, reassuring.  Encouraged her to return to neurosurgery, have procedure they outlined which hopefully  will help with her pain.

## 2015-03-15 NOTE — Progress Notes (Signed)
Nurse called patient and informed her of positive PCR results. Patient informed Nurse that she had ointment from January and that it expired in 2017. Bactroban instructions reviewed with patient. Nurse also instructed patient to use ointment DOS and to bring it with her DOS to continue usage. Patient verbalized understanding.

## 2015-03-15 NOTE — Progress Notes (Signed)
[facility] Patient denied having any acute cardiac or pulmonary issues.

## 2015-03-16 MED ORDER — CEFAZOLIN SODIUM-DEXTROSE 2-3 GM-% IV SOLR
2.0000 g | INTRAVENOUS | Status: AC
  Administered 2015-03-17: 2 g via INTRAVENOUS
  Filled 2015-03-16: qty 50

## 2015-03-17 ENCOUNTER — Inpatient Hospital Stay (HOSPITAL_COMMUNITY)
Admission: RE | Admit: 2015-03-17 | Discharge: 2015-03-21 | DRG: 460 | Disposition: A | Discharge status: Home Health | Payer: Medicare Other | Source: Ambulatory Visit | Attending: Neurological Surgery | Admitting: Neurological Surgery

## 2015-03-17 ENCOUNTER — Inpatient Hospital Stay (HOSPITAL_COMMUNITY): Payer: Medicare Other | Admitting: Anesthesiology

## 2015-03-17 ENCOUNTER — Encounter (HOSPITAL_COMMUNITY)
Admission: RE | Disposition: A | Discharge status: Home Health | Payer: Self-pay | Source: Ambulatory Visit | Attending: Neurological Surgery

## 2015-03-17 ENCOUNTER — Encounter (HOSPITAL_COMMUNITY): Payer: Self-pay | Admitting: *Deleted

## 2015-03-17 ENCOUNTER — Inpatient Hospital Stay (HOSPITAL_COMMUNITY): Payer: Medicare Other

## 2015-03-17 DIAGNOSIS — E785 Hyperlipidemia, unspecified: Secondary | ICD-10-CM | POA: Diagnosis present

## 2015-03-17 DIAGNOSIS — F329 Major depressive disorder, single episode, unspecified: Secondary | ICD-10-CM | POA: Diagnosis present

## 2015-03-17 DIAGNOSIS — Z419 Encounter for procedure for purposes other than remedying health state, unspecified: Secondary | ICD-10-CM

## 2015-03-17 DIAGNOSIS — M4317 Spondylolisthesis, lumbosacral region: Secondary | ICD-10-CM | POA: Diagnosis not present

## 2015-03-17 DIAGNOSIS — K219 Gastro-esophageal reflux disease without esophagitis: Secondary | ICD-10-CM | POA: Diagnosis present

## 2015-03-17 DIAGNOSIS — T84038A Mechanical loosening of other internal prosthetic joint, initial encounter: Principal | ICD-10-CM | POA: Diagnosis present

## 2015-03-17 DIAGNOSIS — S32059A Unspecified fracture of fifth lumbar vertebra, initial encounter for closed fracture: Secondary | ICD-10-CM | POA: Diagnosis present

## 2015-03-17 DIAGNOSIS — Z981 Arthrodesis status: Secondary | ICD-10-CM

## 2015-03-17 DIAGNOSIS — S32058A Other fracture of fifth lumbar vertebra, initial encounter for closed fracture: Secondary | ICD-10-CM | POA: Diagnosis not present

## 2015-03-17 DIAGNOSIS — M4806 Spinal stenosis, lumbar region: Secondary | ICD-10-CM | POA: Diagnosis present

## 2015-03-17 DIAGNOSIS — M4316 Spondylolisthesis, lumbar region: Secondary | ICD-10-CM | POA: Diagnosis not present

## 2015-03-17 DIAGNOSIS — M1990 Unspecified osteoarthritis, unspecified site: Secondary | ICD-10-CM | POA: Diagnosis not present

## 2015-03-17 DIAGNOSIS — F419 Anxiety disorder, unspecified: Secondary | ICD-10-CM | POA: Diagnosis present

## 2015-03-17 DIAGNOSIS — M4326 Fusion of spine, lumbar region: Secondary | ICD-10-CM | POA: Diagnosis not present

## 2015-03-17 DIAGNOSIS — I10 Essential (primary) hypertension: Secondary | ICD-10-CM | POA: Diagnosis present

## 2015-03-17 DIAGNOSIS — H409 Unspecified glaucoma: Secondary | ICD-10-CM | POA: Diagnosis present

## 2015-03-17 DIAGNOSIS — M549 Dorsalgia, unspecified: Secondary | ICD-10-CM | POA: Diagnosis present

## 2015-03-17 HISTORY — PX: MAXIMUM ACCESS (MAS)POSTERIOR LUMBAR INTERBODY FUSION (PLIF) 1 LEVEL: SHX6368

## 2015-03-17 SURGERY — FOR MAXIMUM ACCESS (MAS) POSTERIOR LUMBAR INTERBODY FUSION (PLIF) 1 LEVEL
Anesthesia: General | Site: Back

## 2015-03-17 MED ORDER — ONDANSETRON HCL 4 MG/2ML IJ SOLN
INTRAMUSCULAR | Status: AC
  Filled 2015-03-17: qty 2

## 2015-03-17 MED ORDER — OXYCODONE HCL 10 MG PO TABS: 10.0000 mg | ORAL_TABLET | ORAL | Status: DC | PRN

## 2015-03-17 MED ORDER — DEXTROSE 5 % IV SOLN
500.0000 mg | Freq: Four times a day (QID) | INTRAVENOUS | Status: DC | PRN
  Filled 2015-03-17: qty 5

## 2015-03-17 MED ORDER — ONDANSETRON HCL 4 MG/2ML IJ SOLN: 4.0000 mg | INTRAMUSCULAR | Status: DC | PRN

## 2015-03-17 MED ORDER — PHENYLEPHRINE HCL 10 MG/ML IJ SOLN
INTRAMUSCULAR | Status: DC | PRN
  Administered 2015-03-17 (×5): 80 ug via INTRAVENOUS

## 2015-03-17 MED ORDER — SODIUM CHLORIDE 0.9 % IR SOLN
Status: DC | PRN
  Administered 2015-03-17: 14:00:00

## 2015-03-17 MED ORDER — SODIUM CHLORIDE 0.9 % IV SOLN: 250.0000 mL | INTRAVENOUS | Status: DC

## 2015-03-17 MED ORDER — PHENYLEPHRINE HCL 10 MG/ML IJ SOLN
10.0000 mg | INTRAVENOUS | Status: DC | PRN
  Administered 2015-03-17: 25 ug/min via INTRAVENOUS

## 2015-03-17 MED ORDER — MENTHOL 3 MG MT LOZG: 1.0000 | LOZENGE | OROMUCOSAL | Status: DC | PRN

## 2015-03-17 MED ORDER — PROPOFOL 10 MG/ML IV BOLUS
INTRAVENOUS | Status: AC
  Filled 2015-03-17: qty 20

## 2015-03-17 MED ORDER — OXYCODONE HCL 5 MG PO TABS
10.0000 mg | ORAL_TABLET | ORAL | Status: DC | PRN
Start: 2015-03-17 — End: 2015-03-20
  Administered 2015-03-18 – 2015-03-20 (×10): 10 mg via ORAL
  Filled 2015-03-17 (×10): qty 2

## 2015-03-17 MED ORDER — LATANOPROST 0.005 % OP SOLN
1.0000 [drp] | Freq: Every day | OPHTHALMIC | Status: DC
  Administered 2015-03-17 – 2015-03-20 (×4): 1 [drp] via OPHTHALMIC
  Filled 2015-03-17: qty 2.5

## 2015-03-17 MED ORDER — MORPHINE SULFATE 2 MG/ML IJ SOLN
1.0000 mg | INTRAMUSCULAR | Status: DC | PRN
  Administered 2015-03-17: 2 mg via INTRAVENOUS
  Administered 2015-03-18: 4 mg via INTRAVENOUS
  Administered 2015-03-18 (×2): 2 mg via INTRAVENOUS
  Administered 2015-03-18 (×2): 4 mg via INTRAVENOUS
  Administered 2015-03-18 (×2): 2 mg via INTRAVENOUS
  Administered 2015-03-19: 3 mg via INTRAVENOUS
  Administered 2015-03-19 (×2): 4 mg via INTRAVENOUS
  Administered 2015-03-19 – 2015-03-20 (×3): 2 mg via INTRAVENOUS
  Administered 2015-03-20: 4 mg via INTRAVENOUS
  Administered 2015-03-20: 2 mg via INTRAVENOUS
  Administered 2015-03-21: 4 mg via INTRAVENOUS
  Filled 2015-03-17: qty 1
  Filled 2015-03-17 (×2): qty 2
  Filled 2015-03-17 (×2): qty 1
  Filled 2015-03-17 (×3): qty 2
  Filled 2015-03-17 (×4): qty 1
  Filled 2015-03-17: qty 2
  Filled 2015-03-17 (×2): qty 1
  Filled 2015-03-17 (×2): qty 2
  Filled 2015-03-17: qty 1

## 2015-03-17 MED ORDER — FENTANYL CITRATE (PF) 250 MCG/5ML IJ SOLN
INTRAMUSCULAR | Status: AC
  Filled 2015-03-17: qty 5

## 2015-03-17 MED ORDER — ACETAMINOPHEN 650 MG RE SUPP: 650.0000 mg | RECTAL | Status: DC | PRN

## 2015-03-17 MED ORDER — NEOSTIGMINE METHYLSULFATE 10 MG/10ML IV SOLN
INTRAVENOUS | Status: DC | PRN
  Administered 2015-03-17: 3 mg via INTRAVENOUS

## 2015-03-17 MED ORDER — BUPIVACAINE HCL (PF) 0.25 % IJ SOLN
INTRAMUSCULAR | Status: DC | PRN
  Administered 2015-03-17: 5 mL

## 2015-03-17 MED ORDER — DIAZEPAM 5 MG/ML IJ SOLN
2.5000 mg | Freq: Once | INTRAMUSCULAR | Status: AC
  Administered 2015-03-17: 2.5 mg via INTRAVENOUS

## 2015-03-17 MED ORDER — MIDAZOLAM HCL 5 MG/5ML IJ SOLN
INTRAMUSCULAR | Status: DC | PRN
  Administered 2015-03-17: 2 mg via INTRAVENOUS

## 2015-03-17 MED ORDER — ROCURONIUM BROMIDE 100 MG/10ML IV SOLN
INTRAVENOUS | Status: DC | PRN
  Administered 2015-03-17: 50 mg via INTRAVENOUS

## 2015-03-17 MED ORDER — PHENYLEPHRINE 40 MCG/ML (10ML) SYRINGE FOR IV PUSH (FOR BLOOD PRESSURE SUPPORT)
PREFILLED_SYRINGE | INTRAVENOUS | Status: AC
  Filled 2015-03-17: qty 10

## 2015-03-17 MED ORDER — LIDOCAINE HCL (CARDIAC) 20 MG/ML IV SOLN
INTRAVENOUS | Status: AC
  Filled 2015-03-17: qty 5

## 2015-03-17 MED ORDER — 0.9 % SODIUM CHLORIDE (POUR BTL) OPTIME
TOPICAL | Status: DC | PRN
  Administered 2015-03-17: 1000 mL

## 2015-03-17 MED ORDER — PANTOPRAZOLE SODIUM 40 MG PO TBEC
40.0000 mg | DELAYED_RELEASE_TABLET | Freq: Every day | ORAL | Status: DC
  Administered 2015-03-18 – 2015-03-21 (×4): 40 mg via ORAL
  Filled 2015-03-17 (×4): qty 1

## 2015-03-17 MED ORDER — SODIUM CHLORIDE 0.9 % IJ SOLN
3.0000 mL | Freq: Two times a day (BID) | INTRAMUSCULAR | Status: DC
  Administered 2015-03-18 – 2015-03-21 (×6): 3 mL via INTRAVENOUS

## 2015-03-17 MED ORDER — DEXAMETHASONE SODIUM PHOSPHATE 10 MG/ML IJ SOLN
INTRAMUSCULAR | Status: AC
  Filled 2015-03-17: qty 1

## 2015-03-17 MED ORDER — THROMBIN 5000 UNITS EX SOLR
OROMUCOSAL | Status: DC | PRN
  Administered 2015-03-17: 13:00:00 via TOPICAL

## 2015-03-17 MED ORDER — METHOCARBAMOL 500 MG PO TABS
500.0000 mg | ORAL_TABLET | Freq: Four times a day (QID) | ORAL | Status: DC | PRN
  Administered 2015-03-18: 500 mg via ORAL
  Filled 2015-03-17: qty 1

## 2015-03-17 MED ORDER — LACTATED RINGERS IV SOLN
INTRAVENOUS | Status: DC
  Administered 2015-03-17: 11:00:00 via INTRAVENOUS

## 2015-03-17 MED ORDER — DORZOLAMIDE HCL-TIMOLOL MAL 2-0.5 % OP SOLN
1.0000 [drp] | Freq: Two times a day (BID) | OPHTHALMIC | Status: DC
  Administered 2015-03-17 – 2015-03-21 (×8): 1 [drp] via OPHTHALMIC
  Filled 2015-03-17: qty 10

## 2015-03-17 MED ORDER — POTASSIUM CHLORIDE IN NACL 20-0.9 MEQ/L-% IV SOLN
INTRAVENOUS | Status: DC
  Administered 2015-03-17: 19:00:00 via INTRAVENOUS
  Filled 2015-03-17 (×2): qty 1000

## 2015-03-17 MED ORDER — DEXAMETHASONE SODIUM PHOSPHATE 10 MG/ML IJ SOLN
INTRAMUSCULAR | Status: DC | PRN
  Administered 2015-03-17: 10 mg via INTRAVENOUS

## 2015-03-17 MED ORDER — LIDOCAINE HCL (CARDIAC) 20 MG/ML IV SOLN
INTRAVENOUS | Status: DC | PRN
  Administered 2015-03-17: 60 mg via INTRAVENOUS

## 2015-03-17 MED ORDER — DIAZEPAM 5 MG/ML IJ SOLN
INTRAMUSCULAR | Status: AC
  Filled 2015-03-17: qty 2

## 2015-03-17 MED ORDER — GLYCOPYRROLATE 0.2 MG/ML IJ SOLN
INTRAMUSCULAR | Status: DC | PRN
  Administered 2015-03-17: 0.4 mg via INTRAVENOUS

## 2015-03-17 MED ORDER — PHENYLEPHRINE HCL 10 MG/ML IJ SOLN
INTRAMUSCULAR | Status: AC
  Filled 2015-03-17: qty 1

## 2015-03-17 MED ORDER — SODIUM CHLORIDE 0.9 % IJ SOLN: 3.0000 mL | INTRAMUSCULAR | Status: DC | PRN

## 2015-03-17 MED ORDER — SERTRALINE HCL 100 MG PO TABS
100.0000 mg | ORAL_TABLET | Freq: Every day | ORAL | Status: DC
  Administered 2015-03-17 – 2015-03-21 (×5): 100 mg via ORAL
  Filled 2015-03-17 (×5): qty 1

## 2015-03-17 MED ORDER — CEFAZOLIN SODIUM 1-5 GM-% IV SOLN
1.0000 g | Freq: Three times a day (TID) | INTRAVENOUS | Status: AC
  Administered 2015-03-17 – 2015-03-18 (×2): 1 g via INTRAVENOUS
  Filled 2015-03-17 (×2): qty 50

## 2015-03-17 MED ORDER — ONDANSETRON HCL 4 MG/2ML IJ SOLN: 4.0000 mg | Freq: Once | INTRAMUSCULAR | Status: DC | PRN

## 2015-03-17 MED ORDER — LACTATED RINGERS IV SOLN
INTRAVENOUS | Status: DC | PRN
  Administered 2015-03-17 (×3): via INTRAVENOUS

## 2015-03-17 MED ORDER — OXYBUTYNIN CHLORIDE ER 10 MG PO TB24
10.0000 mg | ORAL_TABLET | Freq: Every day | ORAL | Status: DC
  Administered 2015-03-18 – 2015-03-21 (×4): 10 mg via ORAL
  Filled 2015-03-17 (×4): qty 1

## 2015-03-17 MED ORDER — HYDROMORPHONE HCL 1 MG/ML IJ SOLN
0.5000 mg | INTRAMUSCULAR | Status: DC | PRN
  Administered 2015-03-17 (×3): 0.5 mg via INTRAVENOUS

## 2015-03-17 MED ORDER — HYDROMORPHONE HCL 1 MG/ML IJ SOLN
INTRAMUSCULAR | Status: AC
  Administered 2015-03-17: 0.5 mg
  Filled 2015-03-17: qty 1

## 2015-03-17 MED ORDER — BRIMONIDINE TARTRATE 0.2 % OP SOLN
1.0000 [drp] | Freq: Two times a day (BID) | OPHTHALMIC | Status: DC
  Administered 2015-03-17 – 2015-03-21 (×8): 1 [drp] via OPHTHALMIC
  Filled 2015-03-17: qty 5

## 2015-03-17 MED ORDER — SUCCINYLCHOLINE CHLORIDE 20 MG/ML IJ SOLN
INTRAMUSCULAR | Status: AC
  Filled 2015-03-17: qty 1

## 2015-03-17 MED ORDER — ONDANSETRON HCL 4 MG/2ML IJ SOLN
INTRAMUSCULAR | Status: DC | PRN
  Administered 2015-03-17: 4 mg via INTRAVENOUS

## 2015-03-17 MED ORDER — PHENOL 1.4 % MT LIQD: 1.0000 | OROMUCOSAL | Status: DC | PRN

## 2015-03-17 MED ORDER — FENTANYL CITRATE (PF) 100 MCG/2ML IJ SOLN
INTRAMUSCULAR | Status: DC | PRN
  Administered 2015-03-17 (×2): 100 ug via INTRAVENOUS
  Administered 2015-03-17: 50 ug via INTRAVENOUS
  Administered 2015-03-17 (×2): 100 ug via INTRAVENOUS
  Administered 2015-03-17: 50 ug via INTRAVENOUS

## 2015-03-17 MED ORDER — HYDROCHLOROTHIAZIDE 12.5 MG PO CAPS
12.5000 mg | ORAL_CAPSULE | Freq: Every day | ORAL | Status: DC
  Administered 2015-03-18 – 2015-03-21 (×4): 12.5 mg via ORAL
  Filled 2015-03-17 (×4): qty 1

## 2015-03-17 MED ORDER — BRIMONIDINE TARTRATE 0.15 % OP SOLN
1.0000 [drp] | Freq: Two times a day (BID) | OPHTHALMIC | Status: DC
  Filled 2015-03-17: qty 5

## 2015-03-17 MED ORDER — PROPOFOL 10 MG/ML IV BOLUS
INTRAVENOUS | Status: DC | PRN
  Administered 2015-03-17: 100 mg via INTRAVENOUS

## 2015-03-17 MED ORDER — THROMBIN 20000 UNITS EX SOLR
CUTANEOUS | Status: DC | PRN
  Administered 2015-03-17: 13:00:00 via TOPICAL

## 2015-03-17 MED ORDER — LISINOPRIL 20 MG PO TABS
20.0000 mg | ORAL_TABLET | Freq: Every day | ORAL | Status: DC
  Administered 2015-03-18 – 2015-03-21 (×4): 20 mg via ORAL
  Filled 2015-03-17 (×4): qty 1

## 2015-03-17 MED ORDER — ACETAMINOPHEN 325 MG PO TABS
650.0000 mg | ORAL_TABLET | ORAL | Status: DC | PRN
  Administered 2015-03-20: 650 mg via ORAL
  Filled 2015-03-17: qty 2

## 2015-03-17 MED ORDER — MIDAZOLAM HCL 2 MG/2ML IJ SOLN
INTRAMUSCULAR | Status: AC
  Filled 2015-03-17: qty 2

## 2015-03-17 MED ORDER — HYDROMORPHONE HCL 1 MG/ML IJ SOLN
INTRAMUSCULAR | Status: AC
  Filled 2015-03-17: qty 1

## 2015-03-17 SURGICAL SUPPLY — 67 items
BAG DECANTER FOR FLEXI CONT (MISCELLANEOUS) ×2 IMPLANT
BENZOIN TINCTURE PRP APPL 2/3 (GAUZE/BANDAGES/DRESSINGS) ×2 IMPLANT
BLADE CLIPPER SURG (BLADE) IMPLANT
BONE CANC CHIPS 20CC PCAN1/4 (Bone Implant) ×2 IMPLANT
BONE MATRIX OSTEOCEL PRO MED (Bone Implant) ×2 IMPLANT
BUR MATCHSTICK NEURO 3.0 LAGG (BURR) ×2 IMPLANT
CANISTER SUCT 3000ML PPV (MISCELLANEOUS) ×2 IMPLANT
CATH FOLEY 2WAY SLVR  5CC 14FR (CATHETERS) ×1
CATH FOLEY 2WAY SLVR 5CC 14FR (CATHETERS) ×1 IMPLANT
CHIPS CANC BONE 20CC PCAN1/4 (Bone Implant) ×1 IMPLANT
CONT SPEC 4OZ CLIKSEAL STRL BL (MISCELLANEOUS) ×4 IMPLANT
COVER BACK TABLE 24X17X13 BIG (DRAPES) IMPLANT
COVER BACK TABLE 60X90IN (DRAPES) ×2 IMPLANT
DRAPE C-ARM 42X72 X-RAY (DRAPES) ×2 IMPLANT
DRAPE C-ARMOR (DRAPES) ×2 IMPLANT
DRAPE LAPAROTOMY 100X72X124 (DRAPES) ×2 IMPLANT
DRAPE POUCH INSTRU U-SHP 10X18 (DRAPES) ×2 IMPLANT
DRAPE SURG 17X23 STRL (DRAPES) ×2 IMPLANT
DRSG OPSITE 4X5.5 SM (GAUZE/BANDAGES/DRESSINGS) ×4 IMPLANT
DRSG OPSITE POSTOP 4X6 (GAUZE/BANDAGES/DRESSINGS) ×2 IMPLANT
DRSG TELFA 3X8 NADH (GAUZE/BANDAGES/DRESSINGS) ×2 IMPLANT
DURAPREP 26ML APPLICATOR (WOUND CARE) ×2 IMPLANT
ELECT REM PT RETURN 9FT ADLT (ELECTROSURGICAL) ×2
ELECTRODE REM PT RTRN 9FT ADLT (ELECTROSURGICAL) ×1 IMPLANT
EVACUATOR 1/8 PVC DRAIN (DRAIN) ×2 IMPLANT
GAUZE SPONGE 4X4 16PLY XRAY LF (GAUZE/BANDAGES/DRESSINGS) IMPLANT
GLOVE BIO SURGEON STRL SZ8 (GLOVE) ×8 IMPLANT
GLOVE BIOGEL PI IND STRL 8 (GLOVE) ×3 IMPLANT
GLOVE BIOGEL PI INDICATOR 8 (GLOVE) ×3
GLOVE ECLIPSE 7.5 STRL STRAW (GLOVE) ×2 IMPLANT
GLOVE INDICATOR 7.0 STRL GRN (GLOVE) ×4 IMPLANT
GLOVE INDICATOR 7.5 STRL GRN (GLOVE) ×4 IMPLANT
GLOVE SURG SS PI 7.0 STRL IVOR (GLOVE) ×6 IMPLANT
GOWN STRL REUS W/ TWL LRG LVL3 (GOWN DISPOSABLE) ×1 IMPLANT
GOWN STRL REUS W/ TWL XL LVL3 (GOWN DISPOSABLE) ×3 IMPLANT
GOWN STRL REUS W/TWL 2XL LVL3 (GOWN DISPOSABLE) IMPLANT
GOWN STRL REUS W/TWL LRG LVL3 (GOWN DISPOSABLE) ×1
GOWN STRL REUS W/TWL XL LVL3 (GOWN DISPOSABLE) ×3
HEMOSTAT POWDER KIT SURGIFOAM (HEMOSTASIS) ×2 IMPLANT
KIT BASIN OR (CUSTOM PROCEDURE TRAY) ×2 IMPLANT
KIT ROOM TURNOVER OR (KITS) ×2 IMPLANT
MILL MEDIUM DISP (BLADE) ×2 IMPLANT
NEEDLE HYPO 25X1 1.5 SAFETY (NEEDLE) ×2 IMPLANT
NS IRRIG 1000ML POUR BTL (IV SOLUTION) ×2 IMPLANT
PACK LAMINECTOMY NEURO (CUSTOM PROCEDURE TRAY) ×2 IMPLANT
PAD ARMBOARD 7.5X6 YLW CONV (MISCELLANEOUS) ×6 IMPLANT
PATTIES SURGICAL .5 X.5 (GAUZE/BANDAGES/DRESSINGS) ×2 IMPLANT
ROD PREBENT 80MM LUMBAR (Rod) ×4 IMPLANT
SCREW LOCK (Screw) ×7 IMPLANT
SCREW LOCK FXNS SPNE MAS PL (Screw) ×7 IMPLANT
SCREW SHANK 5.5X30MM (Screw) ×2 IMPLANT
SCREW SHANK 6.5X40 (Screw) ×2 IMPLANT
SCREW SHANK 6.5X40 NS LF (Screw) ×2 IMPLANT
SCREW TULIP 5.5 (Screw) ×6 IMPLANT
SPONGE LAP 4X18 X RAY DECT (DISPOSABLE) IMPLANT
SPONGE SURGIFOAM ABS GEL 100 (HEMOSTASIS) ×2 IMPLANT
STRIP CLOSURE SKIN 1/2X4 (GAUZE/BANDAGES/DRESSINGS) ×2 IMPLANT
SUT VIC AB 0 CT1 18XCR BRD8 (SUTURE) ×2 IMPLANT
SUT VIC AB 0 CT1 8-18 (SUTURE) ×2
SUT VIC AB 2-0 CP2 18 (SUTURE) ×4 IMPLANT
SUT VIC AB 3-0 SH 8-18 (SUTURE) ×4 IMPLANT
SYR 20ML ECCENTRIC (SYRINGE) ×2 IMPLANT
SYR 3ML LL SCALE MARK (SYRINGE) IMPLANT
TOWEL OR 17X24 6PK STRL BLUE (TOWEL DISPOSABLE) ×2 IMPLANT
TOWEL OR 17X26 10 PK STRL BLUE (TOWEL DISPOSABLE) ×2 IMPLANT
TRAY FOLEY CATH 14FRSI W/METER (CATHETERS) ×2 IMPLANT
WATER STERILE IRR 1000ML POUR (IV SOLUTION) ×2 IMPLANT

## 2015-03-17 NOTE — H&P (Signed)
Subjective: Patient is a 74 y.o. female admitted for PLIF L5-S1. Onset of symptoms was a few weeks ago, gradually worsening since that time.  The pain is rated severe, and is located at the across the lower back and radiates to R buttock. The pain is described as aching and occurs intermittently. The symptoms have been progressive. Symptoms are exacerbated by exercise. MRI or CT showed previously placed hardware L3-L5 with pedicle fracture L5 adjacent to the screw, new spondylolisthesis L5-S1, and spinal stenosis L2-3.   Past Medical History  Diagnosis Date  . Anxiety   . GERD (gastroesophageal reflux disease)   . Hyperlipidemia   . Hypertension   . Depression   . Glaucoma     Bilateral eyes  . Pneumonia     hx of  . Urgency of urination   . Diverticulosis   . Arthritis     Past Surgical History  Procedure Laterality Date  . Cholecystectomy  1993  . Vaginal hysterectomy  1970s    uterine prolapse  . Maximum access (mas)posterior lumbar interbody fusion (plif) 2 level N/A 11/27/2014    Procedure: LUMBAR THREE-FOUR, LUMBAR FOUR-FIVE MAXIMUM ACCESS POSTERIOR LUMBAR INTERBODY FUSION;  Surgeon: Tia Alertavid S Marsean Elkhatib, MD;  Location: [facility];  Service: Neurosurgery;  Laterality: N/A;  L3-4 L4-5 MAXIMUM ACCESS POSTERIOR LUMBAR INTERBODY FUSION  . Eye surgery      Cataract Surgery Bilateral, 5 years ago  . Colonoscopy      Prior to Admission medications   Medication Sig Start Date End Date Taking? Authorizing Provider  ALPHAGAN P 0.1 % SOLN Place 1 drop into both eyes every 12 (twelve) hours.  04/05/12  Yes Historical Provider, MD  ALPRAZolam Prudy Feeler(XANAX) 0.25 MG tablet Take 1 tablet (0.25 mg total) by mouth 2 (two) times daily as needed for anxiety. Do not take if not needed for anxiety. 01/18/15  Yes [place] N Rumley, DO  atorvastatin (LIPITOR) 40 MG tablet take 1 tablet by mouth once daily 06/26/14  Yes [place] N Rumley, DO  Calcium Carb-Cholecalciferol (CALCIUM 600 + D PO) Take 1 tablet by mouth 2  (two) times daily.   Yes Historical Provider, MD  dorzolamide-timolol (COSOPT) 22.3-6.8 MG/ML ophthalmic solution Place 1 drop into both eyes 2 (two) times daily.  03/20/11  Yes Historical Provider, MD  esomeprazole (NEXIUM) 40 MG capsule Take 1 capsule (40 mg total) by mouth daily before breakfast. 05/22/14  Yes [place] N Rumley, DO  FLUOCINOLONE ACETONIDE SCALP 0.01 % OIL Apply 1 application topically daily as needed.  12/02/14  Yes Historical Provider, MD  glucosamine-chondroitin 500-400 MG tablet Take 1 tablet by mouth 2 (two) times daily.   Yes Historical Provider, MD  hydrochlorothiazide (MICROZIDE) 12.5 MG capsule Take 1 capsule (12.5 mg total) by mouth daily. 05/04/14  Yes [place] Peaches[place], MD  latanoprost (XALATAN) 0.005 % ophthalmic solution Place 1 drop into both eyes at bedtime.  06/21/14  Yes Historical Provider, MD  lisinopril (PRINIVIL,ZESTRIL) 20 MG tablet Take 1 tablet (20 mg total) by mouth daily. 01/18/15  Yes [place] N Rumley, DO  loratadine (CLARITIN) 10 MG tablet Take 10 mg by mouth daily as needed for allergies.    Yes Historical Provider, MD  Multiple Vitamins-Minerals (MULTIVITAMIN WITH MINERALS) tablet Take 1 tablet by mouth daily.   Yes Historical Provider, MD  naproxen sodium (ANAPROX) 220 MG tablet Take 440 mg by mouth 2 (two) times daily with a meal.   Yes Historical Provider, MD  Omega-3 Fatty Acids (FISH OIL)  1000 MG CAPS Take 1 capsule by mouth 2 (two) times daily.   Yes Historical Provider, MD  oxybutynin (DITROPAN-XL) 10 MG 24 hr tablet Take 1 tablet (10 mg total) by mouth daily. 05/12/13  Yes [place], MD  Oxycodone HCl 10 MG TABS Take 1 tablet by mouth every 4 (four) hours as needed (Pt prefers Twice daily as needed).  02/23/15  Yes Historical Provider, MD  sertraline (ZOLOFT) 100 MG tablet Take 1 tablet (100 mg total) by mouth daily. 12/01/14  Yes  N Rumley, DO  triamcinolone (KENALOG) 0.025 % cream Apply 1 application topically 2 (two)  times daily as needed.   Yes Historical Provider, MD  butalbital-acetaminophen-caffeine (FIORICET, ESGIC) 50-325-40 MG per tablet TAKE 1 TABLET BY MOUTH TWICE DAILY AS NEEDED FOR HEADACHE 08/20/14   [place]incus LargeJazma Y Phelps, DO  conjugated estrogens (PREMARIN) vaginal cream Place vaginally daily. Patient not taking: Reported on 11/18/2014 10/10/12   [place], MD  gabapentin (NEURONTIN) 300 MG capsule 1 capsule po qam; 2 capsules po in afternoon, 2 capsules po qhs Patient not taking: Reported on 11/18/2014 09/14/14   Lenda KelpShane R Hudnall, MD  HYDROcodone-acetaminophen (NORCO) 10-325 MG per tablet Take 1-2 tablets by mouth every 4 (four) hours as needed for moderate pain. Patient not taking: Reported on 03/09/2015 11/28/14   Julio SicksHenry Pool, MD  HYDROcodone-acetaminophen [facility](NORCO) 5-325 MG per tablet Take 1 tablet by mouth every 6 (six) hours as needed for moderate pain. Patient not taking: Reported on 03/09/2015 09/14/14   Lenda KelpShane R Hudnall, MD  Ketoprofen POWD Ketoprofen gel 20% aaa tid Patient not taking: Reported on 11/18/2014 07/17/11   Enid BaasKarl Fields, MD  meloxicam [facility](MOBIC) 15 MG tablet Take one pill daily with food for 7 days then prn Patient not taking: Reported on 11/18/2014 05/06/12   Ralene Corkimothy R Draper, DO  methocarbamol (ROBAXIN) 500 MG tablet Take 1 tablet (500 mg total) by mouth every 6 (six) hours as needed for muscle spasms. Patient not taking: Reported on 03/09/2015 11/28/14   Julio SicksHenry Pool, MD   No Known Allergies  History  Substance Use Topics  . Smoking status: Never Smoker   . Smokeless tobacco: Not on file  . Alcohol Use: 0.6 oz/week    1 Glasses of wine per week     Comment: 1 glass per night    Family History  Problem Relation Age of Onset  . Mental illness Daughter   . Drug abuse Grandchild   . Heart disease Mother [id number]    died MI age 75  . Hyperlipidemia Sister   . Hypertension Sister   . Diabetes Neg Hx   . Cancer Father     Primary Cell Liver Cancer     Review of Systems  Positive  ROS: Negative  All other systems have been reviewed and were otherwise negative with the exception of those mentioned in the HPI and as above.  Objective: Vital signs in last 24 hours: Temp:  [97.8 F (36.6 C)] 97.8 F (36.6 C) (05/11 1132) Pulse Rate:  [79] 79 (05/11 1132) Resp:  [20] 20 (05/11 1132) BP: (136)/(66) 136/66 mmHg (05/11 1132) Weight:  [151 lb (68.493 kg)-151 lb 1 oz (68.522 kg)] 151 lb (68.493 kg) (05/11 1132)  General Appearance: Alert, cooperative, no distress, appears stated age Head: Normocephalic, without obvious abnormality, atraumatic Eyes: PERRL, conjunctiva/corneas clear, EOM's intact    Neck: Supple, symmetrical, trachea midline Back: Symmetric, no curvature, ROM normal, no CVA tenderness Lungs:  respirations unlabored  Heart: Regular rate and rhythm Abdomen: Soft, non-tender Extremities: Extremities normal, atraumatic, no cyanosis or edema Pulses: 2+ and symmetric all extremities Skin: Skin color, texture, turgor normal, no rashes or lesions  NEUROLOGIC:   Mental status: Alert and oriented x4,  no aphasia, good attention span, fund of knowledge, and memory Motor Exam - grossly normal Sensory Exam - grossly normal Reflexes: 1+ Coordination - grossly normal Gait - grossly normal Balance - grossly normal Cranial Nerves: I: smell Not tested  II: visual acuity  OS: nl    OD: nl  II: visual fields Full to confrontation  II: pupils Equal, round, reactive to light  III,VII: ptosis None  III,IV,VI: extraocular muscles  Full ROM  V: mastication Normal  V: facial light touch sensation  Normal  V,VII: corneal reflex  Present  VII: facial muscle function - upper  Normal  VII: facial muscle function - lower Normal  VIII: hearing Not tested  IX: soft palate elevation  Normal  IX,X: gag reflex Present  XI: trapezius strength  5/5  XI: sternocleidomastoid strength 5/5  XI: neck flexion strength  5/5  XII: tongue strength  Normal    Data Review Lab  Results  Component Value Date   WBC 7.4 03/15/2015   HGB 14.2 03/15/2015   HCT 42.4 03/15/2015   MCV 95.3 03/15/2015   PLT 244 03/15/2015   Lab Results  Component Value Date   NA 138 03/15/2015   K 3.9 03/15/2015   CL 102 03/15/2015   CO2 25 03/15/2015   BUN 22* 03/15/2015   CREATININE 0.71 03/15/2015   GLUCOSE 116* 03/15/2015   No results found for: INR, PROTIME  Assessment/Plan: Patient admitted for lumbar reexploration with removal of right L5 pedicle screw with posterior lumbar interbody fusion L5-S1 and decompressive laminectomy L2-3.Marland Kitchen Patient has failed a reasonable attempt at conservative therapy.  I explained the condition and procedure to the patient and answered any questions.  Patient wishes to proceed with procedure as planned. Understands risks/ benefits and typical outcomes of procedure.   Kamilo Och S 03/17/2015 12:48 PM

## 2015-03-17 NOTE — Anesthesia Postprocedure Evaluation (Signed)
  Anesthesia Post-op Note  Patient: Melissa Gonzales  Procedure(s) Performed: Procedure(s): Lumbar five -sacral one Maximum access posterior lumbar interbody fusion/Lumbar two-three  Laminectomy and extension of instrumentation to Sacral-one (N/A)  Patient Location: PACU  Anesthesia Type: General   Level of Consciousness: awake, alert  and oriented  Airway and Oxygen Therapy: Patient Spontanous Breathing  Post-op Pain: mild  Post-op Assessment: Post-op Vital signs reviewed  Post-op Vital Signs: Reviewed  Last Vitals:  Filed Vitals:   03/17/15 1745  BP: 105/52  Pulse: 88  Temp:   Resp: 19    Complications: No apparent anesthesia complications

## 2015-03-17 NOTE — Progress Notes (Signed)
Patient arrived to [id number]. Alert and oriented x4, neuro assessment unchanged.  Patient was oriented to room and floor.

## 2015-03-17 NOTE — Transfer of Care (Signed)
Immediate Anesthesia Transfer of Care Note  Patient: Quitman LivingsCarol A Lucatero  Procedure(s) Performed: Procedure(s): Lumbar five -sacral one Maximum access posterior lumbar interbody fusion/Lumbar two-three  Laminectomy and extension of instrumentation to Sacral-one (N/A)  Patient Location: PACU  Anesthesia Type:General  Level of Consciousness: awake, alert , oriented and patient cooperative  Airway & Oxygen Therapy: Patient Spontanous Breathing and Patient connected to nasal cannula oxygen  Post-op Assessment: Report given to RN, Post -op Vital signs reviewed and stable and Patient moving all extremities  Post vital signs: Reviewed and stable  Last Vitals:  Filed Vitals:   03/17/15 1132  BP: 136/66  Pulse: 79  Temp: 36.6 C  Resp: 20    Complications: No apparent anesthesia complications

## 2015-03-17 NOTE — Anesthesia Preprocedure Evaluation (Signed)
Anesthesia Evaluation  Patient identified by MRN, date of birth, ID band Patient awake    Reviewed: Allergy & Precautions, NPO status   Airway Mallampati: I       Dental   Pulmonary    Pulmonary exam normal       Cardiovascular hypertension, Normal cardiovascular exam    Neuro/Psych  Headaches,    GI/Hepatic GERD-  ,  Endo/Other    Renal/GU      Musculoskeletal  (+) Arthritis -,   Abdominal   Peds  Hematology   Anesthesia Other Findings   Reproductive/Obstetrics                             Anesthesia Physical Anesthesia Plan  ASA: II  Anesthesia Plan: General   Post-op Pain Management:    Induction: Intravenous  Airway Management Planned: Oral ETT  Additional Equipment:   Intra-op Plan:   Post-operative Plan: Extubation in OR  Informed Consent: I have reviewed the patients History and Physical, chart, labs and discussed the procedure including the risks, benefits and alternatives for the proposed anesthesia with the patient or authorized representative who has indicated his/her understanding and acceptance.     Plan Discussed with: CRNA, Anesthesiologist and Surgeon  Anesthesia Plan Comments:         Anesthesia Quick Evaluation

## 2015-03-17 NOTE — Progress Notes (Signed)
Pt sleeping now, has to be reminded to take deep breaths

## 2015-03-17 NOTE — Anesthesia Procedure Notes (Signed)
Procedure Name: Intubation Date/Time: 03/17/2015 1:36 PM Performed by: Orlinda BlalockMCMILLEN, Trinidy Masterson L Pre-anesthesia Checklist: Patient identified, Emergency Drugs available, Suction available, Patient being monitored and Timeout performed Patient Re-evaluated:Patient Re-evaluated prior to inductionOxygen Delivery Method: Circle system utilized Preoxygenation: Pre-oxygenation with 100% oxygen Intubation Type: IV induction Ventilation: Mask ventilation without difficulty Laryngoscope Size: Mac and 3 Grade View: Grade II Tube type: Oral Tube size: 7.5 mm Number of attempts: 1 Airway Equipment and Method: Stylet Placement Confirmation: ETT inserted through vocal cords under direct vision,  breath sounds checked- equal and bilateral and positive ETCO2 Secured at: 21 cm Tube secured with: Tape Dental Injury: Teeth and Oropharynx as per pre-operative assessment

## 2015-03-17 NOTE — Progress Notes (Signed)
Pt crying and holding rails, saying "help me". Dr Yetta Barrejones at bedside and ordered pt Valium.

## 2015-03-17 NOTE — Op Note (Signed)
03/17/2015  4:46 PM  PATIENT:  Melissa Gonzales  74 y.o. female  PRE-OPERATIVE DIAGNOSIS:  Right L5 pedicle fracture with loosening of hardware, spondylolisthesis L5-S1, spinal stenosis L2-3, back and right buttocks pain  POST-OPERATIVE DIAGNOSIS:  Same, loosening of hardware L4 and L5 on the left  PROCEDURE:   1. Decompressive lumbar laminectomy L2-3 and L5-S1 to decompress the neural elements and address the spinal stenosis 2. Exploration of hardware with removal of right L5 pedicle screw and L4 pedicle screw, replacement of left L4 pedicle screw and placement of S1 pedicle screws to perform segment fixation L3-S1 bilaterally.  4. Intertransverse arthrodesis L4-S1 using morcellized autograft and allograft.  SURGEON:  Marikay Alaravid Cullan Launer, MD  ASSISTANTS: Dr. Wynetta Emeryram  ANESTHESIA:  General  EBL: 400 ml  Total I/O In: 2000 [I.V.:2000] Out: 1000 [Urine:600; Blood:400]  BLOOD ADMINISTERED:none  DRAINS: Hemovac   INDICATION FOR PROCEDURE: This patient underwent a 2 level lumbar fusion at L3-4 and L4-5 4 months ago. About 8 weeks ago she fell in her driveway and has suffered from back and right piriformis pain since that time. CT myelogram showed a fracture of the right L5 pedicle with loosening of hardware. She had a posttraumatic spondylolisthesis at L5-S1. The CT myelogram suggested spinal stenosis at L2-3. I recommended lumbar reexploration with extension of the fusion down to S1 to address the pedicle fracture and the spinal stenosis and spondylolisthesis at L5-S1. Also wanted to address the spinal stenosis at L2-3. Patient understood the risks, benefits, and alternatives and potential outcomes and wished to proceed.  PROCEDURE DETAILS:  The patient was brought to the operating room. After induction of generalized endotracheal anesthesia the patient was rolled into the prone position on chest rolls and all pressure points were padded. The patient's lumbar region was cleaned and then prepped with  DuraPrep and draped in the usual sterile fashion. Anesthesia was injected and then a dorsal midline incision was made and carried down to the lumbosacral fascia. The fascia was opened and the paraspinous musculature was taken down in a subperiosteal fashion to expose the previously placed hardware from L3-L5 as well as the lamina and facet complex of L5-S1 as well as the lamina and facet complex of L2-3. A self-retaining retractor was placed. I removed the locking caps from the tulip heads and then removed the rods. I pulled on each pedicle screws excessively and the L4 pedicle screw on the left was loose and the L5 pedicle screw on the right was obviously loose from the fracture. These were removed. The remaining pedicle screws appeared to have good purchase. Then turned my attention to the decompression at L2-3 and utilizing the high-speed drill and Kerrison punches performed a right hemilaminectomy and medial facetectomy foraminotomies L2-3. The underlying yellow ligament was opened and removed in a piecemeal fashion exposing underlying dura and exiting nerve root. Undercut the spinous process and then removed the epidural fat and the ligament from the opposite side perform a decompression L2-3. The cornea dilator then passed easily in the thecal sac was well decompressed. I then turned my attention to the decompression at L5-S1 and the spinous process was removed and complete lumbar laminectomies, hemi- facetectomies, and foraminotomies were performed at L5-S1. The patient had significant spinal stenosis and this required more work than would be required for a simple exposure of the disc for posterior lumbar interbody fusion. Much more generous decompression was undertaken in order to adequately decompress the neural elements and address the patient's leg pain. The yellow ligament  was removed to expose the underlying dura and nerve roots, and generous foraminotomies were performed to adequately decompress the  neural elements. Both the exiting and traversing nerve roots were decompressed on both sides until a coronary dilator passed easily along the nerve roots. Once the decompression was complete, I tried to determine whether or not I was going to perform a posterior lumbar interbody fusion. I felt that there was some loosening of the left L5 pedicle screw and since I would have no L5 purchase and decided against the posterior lumbar interbody fusion because I thought that there may be some instability of the grafts.  Therefore I decided to simply perform a posterior lateral fusion from L4-S1.  I replaced the L4 pedicle screw with a 5 5 x 30 mm cortical pedicle screw. I located the pedicle screw entry zones and S1 utilizing surface landmarks and lateral fluoroscopy. I probed each pedicle, tapped each pedicle with a 55 tapped to achieve bicortical purchase, and then placed 6 5 x 40 mm pedicle screws into the S1 pedicles bilaterally. We then decorticated the transverse processes and laid a mixture of morcellized autograft and allograft out over these to perform intertransverse arthrodesis at L4-S1. We then placed lordotic rods into the multiaxial screw heads of the pedicle screws and locked these in position with the locking caps and anti-torque device. We then checked our construct with AP and lateral fluoroscopy. Irrigated with copious amounts of bacitracin-containing saline solution. Placed a medium Hemovac drain through separate stab incision. Inspected the nerve roots once again to assure adequate decompression, lined to the dura with Gelfoam, and closed the muscle and the fascia with 0 Vicryl. Closed the subcutaneous tissues with 2-0 Vicryl and subcuticular tissues with 3-0 Vicryl. The skin was closed with benzoin and Steri-Strips. Dressing was then applied, the patient was awakened from general anesthesia and transported to the recovery room in stable condition. At the end of the procedure all sponge, needle and  instrument counts were correct.   PLAN OF CARE: Admit to inpatient   PATIENT DISPOSITION:  PACU - hemodynamically stable.   Delay start of Pharmacological VTE agent (>24hrs) due to surgical blood loss or risk of bleeding:  yes

## 2015-03-18 ENCOUNTER — Encounter (HOSPITAL_COMMUNITY): Payer: Self-pay | Admitting: Neurological Surgery

## 2015-03-18 LAB — URINALYSIS, ROUTINE W REFLEX MICROSCOPIC
BILIRUBIN URINE: NEGATIVE
Glucose, UA: NEGATIVE mg/dL
Ketones, ur: NEGATIVE mg/dL
LEUKOCYTES UA: NEGATIVE
NITRITE: NEGATIVE
Protein, ur: NEGATIVE mg/dL
SPECIFIC GRAVITY, URINE: 1.005 (ref 1.005–1.030)
UROBILINOGEN UA: 0.2 mg/dL (ref 0.0–1.0)
pH: 5.5 (ref 5.0–8.0)

## 2015-03-18 LAB — URINE MICROSCOPIC-ADD ON

## 2015-03-18 MED ORDER — DIAZEPAM 5 MG PO TABS
5.0000 mg | ORAL_TABLET | Freq: Four times a day (QID) | ORAL | Status: DC | PRN
  Administered 2015-03-18 – 2015-03-20 (×7): 5 mg via ORAL
  Filled 2015-03-18 (×7): qty 1

## 2015-03-18 NOTE — Progress Notes (Signed)
OT note  Order for brace in order set currently. Ot checked room and spoke with patient and no brace present. RN notified at this time, Ot to hold evaluation pending brace arrival.   Mateo FlowJones, Brynn   OTR/L Pager: [phone] Office: [phone] .

## 2015-03-18 NOTE — Progress Notes (Signed)
PT Cancellation Note  Patient Details Name: Melissa Gonzales MRN: [id number] DOB: 1941-06-29   Cancelled Treatment:    Reason Eval/Treat Not Completed: Other (comment).  Attempted PT x2 this am, but Brace has not been delivered yet.  Will hold mobility at this time pending Brace arrival.     Idrees Quam, [name] 03/18/2015, 11:11 AM

## 2015-03-18 NOTE — Evaluation (Signed)
Occupational Therapy Evaluation Patient Details Name: Melissa Gonzales MRN: [id number] DOB: 24-Jul-1941 Today's Date: 03/18/2015    History of Present Illness 74yo female s/p lumbar five -sacral one Maximum access posterior lumbar interbody fusion/Lumbar two-three [facility]  PMH: GERD Anxiety, depression, glaucoma, diverticulosis, arthritis, jan 2016 back surg   Clinical Impression   Patient is s/p L5-s1 PLIF surgery resulting in functional limitations due to the deficits listed below (see OT problem list). PTA living at home alone independent level. Pt will have hired help and daughter upon d/c/ Patient will benefit from skilled OT acutely to increase independence and safety with ADLS to allow discharge HHOT.     Follow Up Recommendations  Home health OT;Supervision/Assistance - 24 hour    Equipment Recommendations  None recommended by OT    Recommendations for Other Services       Precautions / Restrictions Precautions Precautions: Back Required Braces or Orthoses: Spinal Brace Spinal Brace: Lumbar corset;Applied in sitting position      Mobility Bed Mobility Overal bed mobility: Needs Assistance Bed Mobility: Supine to Sit     Supine to sit: Mod assist     General bed mobility comments: mod cues for sequence and educated on back precautions  Transfers Overall transfer level: Needs assistance Equipment used: Rolling walker (2 wheeled) Transfers: Sit to/from Stand Sit to Stand: Min assist         General transfer comment: cues for hand placement    Balance                                            ADL Overall ADL's : Needs assistance/impaired Eating/Feeding: Modified independent;Bed level   Grooming: Wash/dry hands;Oral care;Min guard;Standing Grooming Details (indicate cue type and reason): educated on use of cups for back precautions         Upper Body Dressing : Minimal  assistance;Sitting Upper Body Dressing Details (indicate cue type and reason): educated on don doff of brace Lower Body Dressing: Moderate assistance;Sit to/from stand   Toilet Transfer: Minimal assistance;Ambulation;BSC;RW   Toileting- Clothing Manipulation and Hygiene: Minimal assistance;Sit to/from stand       Functional mobility during ADLs: Minimal assistance;Rolling walker General ADL Comments: pt requires cues throughout session for back precautions. pt with pain in R hip area worse than back surg. pt reports it grabs and sharp. pt reports some relief with hip extension. pt shifting weight with static standing.      Vision     Perception     Praxis      Pertinent Vitals/Pain Pain Assessment: 0-10 Pain Score: 8  Pain Location: R hip pain Pain Descriptors / Indicators: Constant;Sharp Pain Intervention(s): Monitored during session;Premedicated before session;Repositioned     Hand Dominance Right   Extremity/Trunk Assessment Upper Extremity Assessment Upper Extremity Assessment: Overall WFL for tasks assessed   Lower Extremity Assessment Lower Extremity Assessment: Defer to PT evaluation   Cervical / Trunk Assessment Cervical / Trunk Assessment: Other exceptions (back surg)   Communication Communication Communication: No difficulties   Cognition Arousal/Alertness: Awake/alert Behavior During Therapy: WFL for tasks assessed/performed Overall Cognitive Status: Impaired/Different from baseline Area of Impairment: Safety/judgement;Memory     Memory: Decreased recall of precautions   Safety/Judgement: Decreased awareness of safety     General Comments: pt with poor recall of precautions and needs reinforcement. Pt slightly impulsive at EOB  General Comments       Exercises       Shoulder Instructions      Home Living Family/patient expects to be discharged to:: Private residence Living Arrangements: Alone Available Help at Discharge: Family;Available  24 hours/day Type of Home: House Home Access: Stairs to enter Entergy CorporationEntrance Stairs-Number of Steps: 3 Entrance Stairs-Rails: Left;Right Home Layout: One level     Bathroom Shower/Tub: Chief Strategy OfficerTub/shower unit   Bathroom Toilet: Standard     Home Equipment: Environmental consultantWalker - 2 wheels;Bedside commode          Prior Functioning/Environment Level of Independence: Independent        Comments: driving    OT Diagnosis: Generalized weakness;Acute pain   OT Problem List: Decreased strength;Decreased activity tolerance;Impaired balance (sitting and/or standing);Decreased safety awareness;Decreased knowledge of use of DME or AE;Decreased knowledge of precautions;Pain   OT Treatment/Interventions: Self-care/ADL training;Therapeutic exercise;DME and/or AE instruction;Therapeutic activities;Patient/family education;Balance training    OT Goals(Current goals can be found in the care plan section) Acute Rehab OT Goals Patient Stated Goal: to go home OT Goal Formulation: With patient Time For Goal Achievement: 04/01/15 Potential to Achieve Goals: Good  OT Frequency: Min 2X/week   Barriers to D/C:    pt has hired an Engineer, productionaide and has daughter (A) upon d/c. Pt currently with church member present during session. Pt agreeable to church member presence during session       Co-evaluation              End of Session Equipment Utilized During Treatment: Gait belt;Rolling walker;Back brace Nurse Communication: Mobility status;Precautions  Activity Tolerance: Patient limited by pain Patient left: in chair;with call bell/phone within reach;with family/visitor present   Time: 1329-1401 OT Time Calculation (min): 32 min Charges:  OT General Charges $OT Visit: 1 Procedure OT Evaluation $Initial OT Evaluation Tier I: 1 Procedure G-Codes:    Harolyn RutherfordJones, Sheriann Newmann B 03/18/2015, 2:58 PM  Pager: [phone]

## 2015-03-18 NOTE — Progress Notes (Signed)
Subjective: Patient reports she's doing ok. Some right piriformis pain. No leg pain or NTW.   Objective: Vital signs in last 24 hours: Temp:  [97.8 F (36.6 C)-98.4 F (36.9 C)] 98.4 F (36.9 C) (05/12 0605) Pulse Rate:  [79-105] 85 (05/12 0605) Resp:  [7-24] 16 (05/12 0605) BP: (105-167)/(52-72) 116/61 mmHg (05/12 0605) SpO2:  [85 %-97 %] 96 % (05/12 0605) Weight:  [151 lb (68.493 kg)-151 lb 1 oz (68.522 kg)] 151 lb (68.493 kg) (05/11 1132)  Intake/Output from previous day: 05/11 0730 - 05/12 0729 In: 2400 [I.V.:2400] Out: 1240 [Urine:675; Drains:165; Blood:400] Intake/Output this shift:    Neuro normal to in bed exam, good DF /PF  Lab Results: Lab Results  Component Value Date   WBC 7.4 03/15/2015   HGB 14.2 03/15/2015   HCT 42.4 03/15/2015   MCV 95.3 03/15/2015   PLT 244 03/15/2015   No results found for: INR, PROTIME BMET Lab Results  Component Value Date   NA 138 03/15/2015   K 3.9 03/15/2015   CL 102 03/15/2015   CO2 25 03/15/2015   GLUCOSE 116* 03/15/2015   BUN 22* 03/15/2015   CREATININE 0.71 03/15/2015   CALCIUM 9.6 03/15/2015    Studies/Results: Dg Lumbar Spine 2-3 Views  03/17/2015   CLINICAL DATA:  Imaging for lumbar spine fusion.  EXAM: LUMBAR SPINE - 2-3 VIEW; DG C-ARM 61-120 MIN  COMPARISON:  Lumbar CT, 02/18/2015.  FLUOROSCOPY TIME:  Radiation Exposure Index (as provided by the fluoroscopic device):  If the device does not provide the exposure index:  Fluoroscopy Time:  0 minutes and 34 seconds  Number of Acquired Images:  2  FINDINGS: Images show extension of the fusion. Pedicle screws are now noted in L3, L4 and S1 on the right and L3, L4, L5 and S1 on the left. The screw noted in the fractured pedicle of L5 on the right on the prior imaging has been removed.  Interconnecting rods span the pedicle screws. The orthopedic hardware appears well seated and aligned. Radiolucent disc spacers maintained disc height at L3-L4 and L4-L5. Preview  anterolisthesis of L4 on L5 and L5 on S1 is stable.  There are no new fractures or evidence of an operative complication.  IMPRESSION: Imaging for lumbar spine fusion as detailed.   Electronically Signed   By: Amie Portlandavid  Ormond M.D.   On: 03/17/2015 16:52   Dg C-arm 1-60 Min  03/17/2015   CLINICAL DATA:  Imaging for lumbar spine fusion.  EXAM: LUMBAR SPINE - 2-3 VIEW; DG C-ARM 61-120 MIN  COMPARISON:  Lumbar CT, 02/18/2015.  FLUOROSCOPY TIME:  Radiation Exposure Index (as provided by the fluoroscopic device):  If the device does not provide the exposure index:  Fluoroscopy Time:  0 minutes and 34 seconds  Number of Acquired Images:  2  FINDINGS: Images show extension of the fusion. Pedicle screws are now noted in L3, L4 and S1 on the right and L3, L4, L5 and S1 on the left. The screw noted in the fractured pedicle of L5 on the right on the prior imaging has been removed.  Interconnecting rods span the pedicle screws. The orthopedic hardware appears well seated and aligned. Radiolucent disc spacers maintained disc height at L3-L4 and L4-L5. Preview anterolisthesis of L4 on L5 and L5 on S1 is stable.  There are no new fractures or evidence of an operative complication.  IMPRESSION: Imaging for lumbar spine fusion as detailed.   Electronically Signed   By: Renard Hamperavid  Ormond M.D.  On: 03/17/2015 16:52    Assessment/Plan: Making the expected recovery POD 1 after spinal fusion. PT/OT...mobilize today   LOS: 1 day    JONES,DAVID S 03/18/2015, 7:47 AM

## 2015-03-18 NOTE — Evaluation (Signed)
Physical Therapy Evaluation Patient Details Name: Melissa Gonzales MRN: [id number] DOB: 07-06-1941 Today's Date: 03/18/2015   History of Present Illness  74yo female s/p lumbar five -sacral one Maximum access posterior lumbar interbody fusion/Lumbar two-three [facility]  PMH: GERD Anxiety, depression, glaucoma, diverticulosis, arthritis, jan 2016 back surg  Clinical Impression  Pt very painful and seems a little anxious with mobility.  Pt will have 24hr help at D/C and will continue to follow while on acute.      Follow Up Recommendations Home health PT;Supervision/Assistance - 24 hour    Equipment Recommendations  None recommended by PT    Recommendations for Other Services       Precautions / Restrictions Precautions Precautions: Back Precaution Booklet Issued: No Precaution Comments: Reviewed precautions.   Required Braces or Orthoses: Spinal Brace Spinal Brace: Lumbar corset;Applied in sitting position Restrictions Weight Bearing Restrictions: No      Mobility  Bed Mobility Overal bed mobility: Needs Assistance Bed Mobility: Supine to Sit     Supine to sit: Mod assist     General bed mobility comments: Up with OT.  Transfers Overall transfer level: Needs assistance Equipment used: Rolling walker (2 wheeled) Transfers: Sit to/from Stand Sit to Stand: Min assist         General transfer comment: cues for UE use and controlling descent to return to sitting.    Ambulation/Gait Ambulation/Gait assistance: Min guard Ambulation Distance (Feet): 20 Feet Assistive device: Rolling walker (2 wheeled) Gait Pattern/deviations: Step-through pattern;Decreased stride length;Antalgic     General Gait Details: cues for positioning within RW, upright psoture.  pt anxious during mobility and indicates pain in R hip.    Stairs            Wheelchair Mobility    Modified Rankin (Stroke Patients Only)        Balance Overall balance assessment: No apparent balance deficits (not formally assessed)                                           Pertinent Vitals/Pain Pain Assessment: 0-10 Pain Score: 8  Pain Location: R hip worse than back Pain Descriptors / Indicators: Constant;Sharp Pain Intervention(s): Monitored during session;Premedicated before session;Repositioned    Home Living Family/patient expects to be discharged to:: Private residence Living Arrangements: Alone Available Help at Discharge: Family;Available 24 hours/day Type of Home: House Home Access: Stairs to enter Entrance Stairs-Rails: LawyerLeft;Right Entrance Stairs-Number of Steps: 3 Home Layout: One level Home Equipment: Walker - 2 wheels;Bedside commode      Prior Function Level of Independence: Independent         Comments: driving     Hand Dominance   Dominant Hand: Right    Extremity/Trunk Assessment   Upper Extremity Assessment: Defer to OT evaluation           Lower Extremity Assessment: Overall WFL for tasks assessed (except R hip pain.  )      Cervical / Trunk Assessment: Other exceptions (back surg)  Communication   Communication: No difficulties  Cognition Arousal/Alertness: Awake/alert Behavior During Therapy: WFL for tasks assessed/performed Overall Cognitive Status: Impaired/Different from baseline Area of Impairment: Safety/judgement;Memory     Memory: Decreased recall of precautions   Safety/Judgement: Decreased awareness of safety     General Comments: pt with poor recall of precautions and needs reinforcement. Pt slightly impulsive at  EOB    General Comments General comments (skin integrity, edema, etc.): dressing intact drain intact    Exercises        Assessment/Plan    PT Assessment Patient needs continued PT services  PT Diagnosis Difficulty walking;Acute pain   PT Problem List Decreased strength;Decreased activity tolerance;Decreased  balance;Decreased mobility;Decreased knowledge of use of DME;Decreased cognition;Decreased knowledge of precautions;Pain  PT Treatment Interventions DME instruction;Gait training;Stair training;Functional mobility training;Therapeutic activities;Therapeutic exercise;Balance training;Neuromuscular re-education;Patient/family education   PT Goals (Current goals can be found in the Care Plan section) Acute Rehab PT Goals Patient Stated Goal: to go home PT Goal Formulation: With patient Time For Goal Achievement: 03/25/15 Potential to Achieve Goals: Good    Frequency Min 5X/week   Barriers to discharge        Co-evaluation               End of Session Equipment Utilized During Treatment: Gait belt;Back brace Activity Tolerance: Patient limited by pain Patient left: in chair;with call bell/phone within reach;with family/visitor present Nurse Communication: Mobility status         Time: 4098-11911401-1419 PT Time Calculation (min) (ACUTE ONLY): 18 min   Charges:   PT Evaluation $Initial PT Evaluation Tier I: 1 Procedure     PT G CodesSunny Schlein:        Koren Plyler F, [place] CarolinaPT [phone] 03/18/2015, 3:25 PM

## 2015-03-19 MED ORDER — SENNA 8.6 MG PO TABS
1.0000 | ORAL_TABLET | Freq: Every day | ORAL | Status: DC
  Administered 2015-03-19 – 2015-03-20 (×2): 8.6 mg via ORAL
  Filled 2015-03-19 (×2): qty 1

## 2015-03-19 MED ORDER — DOCUSATE SODIUM 100 MG PO CAPS
100.0000 mg | ORAL_CAPSULE | Freq: Two times a day (BID) | ORAL | Status: DC
  Administered 2015-03-19 – 2015-03-21 (×4): 100 mg via ORAL
  Filled 2015-03-19 (×4): qty 1

## 2015-03-19 NOTE — Progress Notes (Signed)
Subjective: Patient reports back and some R leg discomfort. No NTW  Objective: Vital signs in last 24 hours: Temp:  [98.7 F (37.1 C)-99.7 F (37.6 C)] 99 F (37.2 C) (05/13 0612) Pulse Rate:  [62-92] 62 (05/13 0949) Resp:  [18-20] 18 (05/13 0949) BP: (116-145)/(53-67) 126/53 mmHg (05/13 0949) SpO2:  [93 %-96 %] 96 % (05/13 0949)  Intake/Output from previous day: 05/12 0730 - 05/13 0729 In: -  Out: 1050 [Urine:900; Drains:150] Intake/Output this shift:    Neurologic: Grossly normal, walking to BR with walker  Lab Results: Lab Results  Component Value Date   WBC 7.4 03/15/2015   HGB 14.2 03/15/2015   HCT 42.4 03/15/2015   MCV 95.3 03/15/2015   PLT 244 03/15/2015   No results found for: INR, PROTIME BMET Lab Results  Component Value Date   NA 138 03/15/2015   K 3.9 03/15/2015   CL 102 03/15/2015   CO2 25 03/15/2015   GLUCOSE 116* 03/15/2015   BUN 22* 03/15/2015   CREATININE 0.71 03/15/2015   CALCIUM 9.6 03/15/2015    Studies/Results: Dg Lumbar Spine 2-3 Views  03/17/2015   CLINICAL DATA:  Imaging for lumbar spine fusion.  EXAM: LUMBAR SPINE - 2-3 VIEW; DG C-ARM 61-120 MIN  COMPARISON:  Lumbar CT, 02/18/2015.  FLUOROSCOPY TIME:  Radiation Exposure Index (as provided by the fluoroscopic device):  If the device does not provide the exposure index:  Fluoroscopy Time:  0 minutes and 34 seconds  Number of Acquired Images:  2  FINDINGS: Images show extension of the fusion. Pedicle screws are now noted in L3, L4 and S1 on the right and L3, L4, L5 and S1 on the left. The screw noted in the fractured pedicle of L5 on the right on the prior imaging has been removed.  Interconnecting rods span the pedicle screws. The orthopedic hardware appears well seated and aligned. Radiolucent disc spacers maintained disc height at L3-L4 and L4-L5. Preview anterolisthesis of L4 on L5 and L5 on S1 is stable.  There are no new fractures or evidence of an operative complication.  IMPRESSION:  Imaging for lumbar spine fusion as detailed.   Electronically Signed   By: Amie Portlandavid  Ormond M.D.   On: 03/17/2015 16:52   Dg C-arm 1-60 Min  03/17/2015   CLINICAL DATA:  Imaging for lumbar spine fusion.  EXAM: LUMBAR SPINE - 2-3 VIEW; DG C-ARM 61-120 MIN  COMPARISON:  Lumbar CT, 02/18/2015.  FLUOROSCOPY TIME:  Radiation Exposure Index (as provided by the fluoroscopic device):  If the device does not provide the exposure index:  Fluoroscopy Time:  0 minutes and 34 seconds  Number of Acquired Images:  2  FINDINGS: Images show extension of the fusion. Pedicle screws are now noted in L3, L4 and S1 on the right and L3, L4, L5 and S1 on the left. The screw noted in the fractured pedicle of L5 on the right on the prior imaging has been removed.  Interconnecting rods span the pedicle screws. The orthopedic hardware appears well seated and aligned. Radiolucent disc spacers maintained disc height at L3-L4 and L4-L5. Preview anterolisthesis of L4 on L5 and L5 on S1 is stable.  There are no new fractures or evidence of an operative complication.  IMPRESSION: Imaging for lumbar spine fusion as detailed.   Electronically Signed   By: Amie Portlandavid  Ormond M.D.   On: 03/17/2015 16:52    Assessment/Plan: Making the expected recovery, mobilizing some   LOS: 2 days    Jennamarie Goings S  03/19/2015, 11:18 AM

## 2015-03-19 NOTE — Progress Notes (Signed)
Physical Therapy Treatment Patient Details Name: Melissa Gonzales MRN: [id number] DOB: Mar 22, 1941 Today's Date: 03/19/2015    History of Present Illness 74yo female s/p lumbar five -sacral one Maximum access posterior lumbar interbody fusion/Lumbar two-three [facility]  PMH: GERD Anxiety, depression, glaucoma, diverticulosis, arthritis, jan 2016 back surg    PT Comments    Patient progressing with mobility. Tolerated in hall ambulation with rest break and increased cues for gait. Patient limited overall by pain. Will continue to see and progress as tolerated.  Follow Up Recommendations  Home health PT;Supervision/Assistance - 24 hour     Equipment Recommendations  None recommended by PT    Recommendations for Other Services       Precautions / Restrictions Precautions Precautions: Back Precaution Comments: Reviewed precautions.   Required Braces or Orthoses: Spinal Brace Spinal Brace: Lumbar corset;Applied in sitting position    Mobility  Bed Mobility Overal bed mobility: Needs Assistance Bed Mobility: Rolling;Sidelying to Sit;Sit to Sidelying Rolling: Min guard Sidelying to sit: Min guard     Sit to sidelying: Min assist General bed mobility comments: increased time to perform, reliance on rail, assist to elevate LEs back to bed  Transfers Overall transfer level: Needs assistance Equipment used: Rolling walker (2 wheeled) Transfers: Sit to/from Stand Sit to Stand: Supervision         General transfer comment: cues for hand placement  Ambulation/Gait Ambulation/Gait assistance: Supervision;Min guard Ambulation Distance (Feet): 140 Feet Assistive device: Rolling walker (2 wheeled) Gait Pattern/deviations: Step-through pattern;Decreased stride length;Antalgic Gait velocity: decreased significantly Gait velocity interpretation: <1.8 ft/sec, indicative of risk for recurrent falls General Gait Details: VCs for  upright posture and cadence/stride. Patient initially with step to gait technique and short shuffling steps, educated on increased stride with step through pattern and increased gait speed. Patient had difficulty maintaining functional gait speed. One standing rest break for postional and postural cues   Stairs            Wheelchair Mobility    Modified Rankin (Stroke Patients Only)       Balance                                    Cognition Arousal/Alertness: Awake/alert Behavior During Therapy: WFL for tasks assessed/performed Overall Cognitive Status: Impaired/Different from baseline                 General Comments: slow processing and sequencing     Exercises      General Comments        Pertinent Vitals/Pain Pain Assessment: 0-10 Pain Score: 6  Pain Location: low back and R hip Pain Descriptors / Indicators: Constant Pain Intervention(s): Monitored during session;Repositioned;Premedicated before session;Relaxation    Home Living                      Prior Function            PT Goals (current goals can now be found in the care plan section) Acute Rehab PT Goals Patient Stated Goal: to go home PT Goal Formulation: With patient Time For Goal Achievement: 03/25/15 Potential to Achieve Goals: Good Progress towards PT goals: Progressing toward goals    Frequency  Min 5X/week    PT Plan Current plan remains appropriate    Co-evaluation             End of Session  Equipment Utilized During Treatment: Gait belt;Back brace Activity Tolerance: Patient limited by pain Patient left: in bed;with call bell/phone within reach;with bed alarm set     Time: 1339-1400 PT Time Calculation (min) (ACUTE ONLY): 21 min  Charges:  $Gait Training: 8-22 mins                    G CodesFabio Asa:      Nunzio Banet J 03/19/2015, 4:31 PM Melissa Gonzales, PT DPT  [phone]

## 2015-03-19 NOTE — Progress Notes (Signed)
Occupational Therapy Treatment Patient Details Name: Melissa Gonzales MRN: [id number] DOB: 12-20-1940 Today's Date: 03/19/2015    History of present illness 74yo female s/p lumbar five -sacral one Maximum access posterior lumbar interbody fusion/Lumbar two-three [facility]  PMH: GERD Anxiety, depression, glaucoma, diverticulosis, arthritis, jan 2016 back surg   OT comments  Pt able to cross bil LE this session so No AE education required. Pt progressing toward goals and R hip pain appears to be biggest limiting factor. Pt will have 24/ 7 (A) upon d/c home.    Follow Up Recommendations  Home health OT;Supervision/Assistance - 24 hour    Equipment Recommendations  None recommended by OT    Recommendations for Other Services      Precautions / Restrictions Precautions Precautions: Back Precaution Comments: Reviewed precautions.   Required Braces or Orthoses: Spinal Brace Spinal Brace: Lumbar corset;Applied in sitting position       Mobility Bed Mobility Overal bed mobility: Needs Assistance Bed Mobility: Supine to Sit     Supine to sit: Min guard     General bed mobility comments: hob decr below 20 degrees to simulate home  Transfers Overall transfer level: Needs assistance Equipment used: Rolling walker (2 wheeled) Transfers: Sit to/from Stand Sit to Stand: Supervision         General transfer comment: cues for hand placement    Balance Overall balance assessment: Needs assistance                                 ADL Overall ADL's : Needs assistance/impaired                     Lower Body Dressing: Supervision/safety;Sit to/from stand Lower Body Dressing Details (indicate cue type and reason): pt able to cross bil LE Toilet Transfer: Supervision/safety           Functional mobility during ADLs: Supervision/safety;Rolling walker General ADL Comments: Pt able to complete bed mobility,  chair transfer, and cross bil LE for LB dressing. pt will have daughter and caregiver (A) upon d/c. Pt reviewed on bathing sponge bath until MD clears. pt plans to wear a pad for urinary urgency. pt able to don brace mod I with incr time      Vision                     Perception     Praxis      Cognition   Behavior During Therapy: [facility]FL for tasks assessed/performed Overall Cognitive Status: Impaired/Different from baseline                  General Comments: slow processing and sequencing     Extremity/Trunk Assessment               Exercises     Shoulder Instructions       General Comments      Pertinent Vitals/ Pain       Pain Assessment: 0-10 Pain Location: R hip pain back pain Pain Descriptors / Indicators: Constant Pain Intervention(s): Monitored during session;Premedicated before session;Repositioned  Home Living                                          Prior Functioning/Environment  Frequency Min 2X/week     Progress Toward Goals  OT Goals(current goals can now be found in the care plan section)  Progress towards OT goals: Progressing toward goals  Acute Rehab OT Goals Patient Stated Goal: to go home OT Goal Formulation: With patient Time For Goal Achievement: 04/01/15 Potential to Achieve Goals: Good ADL Goals Pt Will Perform Lower Body Bathing: with min guard assist;sit to/from stand;with adaptive equipment Pt Will Transfer to Toilet: with min guard assist;bedside commode;ambulating Pt Will Perform Tub/Shower Transfer: Tub transfer;rolling walker;ambulating Additional ADL Goal #1: Pt will complete bed mobility with hob < 20 degrees no bed rails supervision  Plan Discharge plan remains appropriate    Co-evaluation                 End of Session Equipment Utilized During Treatment: Gait belt;Rolling walker;Back brace   Activity Tolerance Patient tolerated treatment well   Patient Left  in chair;with call bell/phone within reach;with chair alarm set;with family/visitor present   Nurse Communication Mobility status;Precautions        Time: [id number] OT Time Calculation (min): 15 min  Charges: OT General Charges $OT Visit: 1 Procedure OT Treatments $Self Care/Home Management : 8-22 mins  Boone MasterJones, Josette Shimabukuro B 03/19/2015, 12:22 PM  Pager: [phone]

## 2015-03-19 NOTE — Progress Notes (Signed)
PT Cancellation Note  Patient Details Name: Melissa Gonzales MRN: [id number] DOB: [birth year]   Cancelled Treatment:    Reason Eval/Treat Not Completed: Pain limiting ability to participate;Fatigue/lethargy limiting ability to participate.  Will try back another time.     [name], [name] 03/19/2015, 9:30 AM

## 2015-03-20 MED ORDER — FENTANYL 25 MCG/HR TD PT72
50.0000 ug | MEDICATED_PATCH | TRANSDERMAL | Status: DC
  Administered 2015-03-20: 50 ug via TRANSDERMAL
  Filled 2015-03-20: qty 2

## 2015-03-20 MED ORDER — OXYCODONE-ACETAMINOPHEN 5-325 MG PO TABS
2.0000 | ORAL_TABLET | ORAL | Status: DC | PRN
  Administered 2015-03-20 – 2015-03-21 (×4): 2 via ORAL
  Filled 2015-03-20 (×4): qty 2

## 2015-03-20 MED ORDER — DIAZEPAM 5 MG PO TABS
5.0000 mg | ORAL_TABLET | Freq: Four times a day (QID) | ORAL | Status: DC | PRN
  Administered 2015-03-21: 5 mg via ORAL
  Filled 2015-03-20: qty 1

## 2015-03-20 MED ORDER — OXYCODONE-ACETAMINOPHEN 7.5-325 MG PO TABS: 2.0000 | ORAL_TABLET | ORAL | Status: DC | PRN

## 2015-03-20 MED ORDER — OXYCODONE HCL 5 MG PO TABS
5.0000 mg | ORAL_TABLET | ORAL | Status: DC | PRN
  Administered 2015-03-21 (×2): 5 mg via ORAL
  Filled 2015-03-20 (×2): qty 1

## 2015-03-20 NOTE — Progress Notes (Signed)
Subjective: Patient reports continued significant postoperative back pain. Some right hip pain. No leg pain or numbness tingling or weakness.  Objective: Vital signs in last 24 hours: Temp:  [98.1 F (36.7 C)-99.4 F (37.4 C)] 98.3 F (36.8 C) (05/14 0550) Pulse Rate:  [62-82] 75 (05/14 0550) Resp:  [16-18] 18 (05/14 0550) BP: (116-133)/(48-62) 133/48 mmHg (05/14 0550) SpO2:  [90 %-97 %] 97 % (05/14 0550)  Intake/Output from previous day: 05/13 0730 - 05/14 0729 In: -  Out: 70 [Drains:70] Intake/Output this shift:    Neurologic: Grossly normal  Lab Results: Lab Results  Component Value Date   WBC 7.4 03/15/2015   HGB 14.2 03/15/2015   HCT 42.4 03/15/2015   MCV 95.3 03/15/2015   PLT 244 03/15/2015   No results found for: INR, PROTIME BMET Lab Results  Component Value Date   NA 138 03/15/2015   K 3.9 03/15/2015   CL 102 03/15/2015   CO2 25 03/15/2015   GLUCOSE 116* 03/15/2015   BUN 22* 03/15/2015   CREATININE 0.71 03/15/2015   CALCIUM 9.6 03/15/2015    Studies/Results: No results found.  Assessment/Plan: Continue to mobilize, continue pain control.  LOS: 3 days    Keyvon Herter S 03/20/2015, 7:55 AM

## 2015-03-20 NOTE — Progress Notes (Signed)
Physical Therapy Treatment Patient Details Name: Melissa LivingsCarol A Inocencio MRN: [id number] DOB: Sep 15, 1941 Today's Date: 03/20/2015    History of Present Illness 74yo female s/p lumbar five -sacral one Maximum access posterior lumbar interbody fusion/Lumbar two-three Laminectomy and extension of instrumentation to [facility]  PMH: GERD Anxiety, depression, glaucoma, diverticulosis, arthritis, jan 2016 back surg    PT Comments    Pt con't to be limited by pain however was able to complete stair negotiation. Pt with limited sitting tolerance due to pain. Pt able to recall all back precautions. Acute PT to con't to follow to progress indep. With mobility as able.  Follow Up Recommendations  Home health PT;Supervision/Assistance - 24 hour     Equipment Recommendations  None recommended by PT    Recommendations for Other Services       Precautions / Restrictions Precautions Precautions: Back Precaution Booklet Issued: Yes (comment) Precaution Comments: pt able to recall all precautions Required Braces or Orthoses: Spinal Brace Spinal Brace: Lumbar corset;Applied in sitting position (educated on proper technique for doffing) Restrictions Weight Bearing Restrictions: No    Mobility  Bed Mobility Overal bed mobility: Needs Assistance Bed Mobility: Rolling;Sidelying to Sit;Sit to Sidelying Rolling: Min guard Sidelying to sit: Min guard     Sit to sidelying: Min assist General bed mobility comments: increased time, v/c's for technique and direction  Transfers Overall transfer level: Needs assistance Equipment used: Rolling walker (2 wheeled) Transfers: Sit to/from Stand Sit to Stand: Supervision         General transfer comment: pt demo'd safe technique  Ambulation/Gait Ambulation/Gait assistance: Supervision Ambulation Distance (Feet): 150 Feet Assistive device: Rolling walker (2 wheeled) Gait Pattern/deviations: Step-through pattern;Decreased stride length (decreased step  height) Gait velocity: decreased significantly Gait velocity interpretation: <1.8 ft/sec, indicative of risk for recurrent falls General Gait Details: v/c's to stay in walker, walker adjusted to optimal height, v/c's to increase step length   Stairs Stairs: Yes Stairs assistance: Min guard Stair Management: Two rails;Step to pattern Number of Stairs: 3 General stair comments: completed 2 trials, v/c's for sequencing  Wheelchair Mobility    Modified Rankin (Stroke Patients Only)       Balance                                    Cognition Arousal/Alertness: Awake/alert Behavior During Therapy: WFL for tasks assessed/performed Overall Cognitive Status: Within Functional Limits for tasks assessed                      Exercises      General Comments        Pertinent Vitals/Pain Pain Assessment: 0-10 Pain Score: 4  Pain Location: 4 at rest, 8/10 during ambulation, at surgical site Pain Descriptors / Indicators: Aching;Constant Pain Intervention(s): Monitored during session;Patient requesting pain meds-RN notified    Home Living                      Prior Function            PT Goals (current goals can now be found in the care plan section) Acute Rehab PT Goals Patient Stated Goal: go home Progress towards PT goals: Progressing toward goals    Frequency  Min 5X/week    PT Plan Current plan remains appropriate    Co-evaluation             End of Session Equipment  Utilized During Treatment: Gait belt;Back brace Activity Tolerance: Patient limited by pain Patient left: in bed;with call bell/phone within reach;with bed alarm set     Time: [id number] PT Time Calculation (min) (ACUTE ONLY): 26 min  Charges:  $Gait Training: 23-37 mins                    G Codes:      Marcene BrawnChadwell, Milburn Freeney Marie 03/20/2015, 12:55 PM  Lewis ShockAshly Charissa Knowles, PT, DPT Pager #: [phone] Office #: [phone]

## 2015-03-21 MED ORDER — OXYCODONE HCL 10 MG PO TABS: 10.0000 mg | ORAL_TABLET | ORAL | Status: DC | PRN

## 2015-03-21 MED ORDER — TIZANIDINE HCL 4 MG PO TABS: 4.0000 mg | ORAL_TABLET | Freq: Three times a day (TID) | ORAL | Status: DC | PRN

## 2015-03-21 MED ORDER — FENTANYL 50 MCG/HR TD PT72: 50.0000 ug | MEDICATED_PATCH | TRANSDERMAL | Status: DC

## 2015-03-21 NOTE — Progress Notes (Signed)
Patient ID: Melissa Gonzales, female   DOB: Mar 23, 1941, 74 y.o.   MRN: [id number] Less incisional pain. Still bloody drainage in the hemovac. Possible can be dc in am.

## 2015-03-21 NOTE — Discharge Summary (Signed)
Physician Discharge Summary  Patient ID: Melissa Gonzales MRN: [id number] DOB/AGE: 1941/06/21 74 y.o.  Admit date: 03/17/2015 Discharge date: 03/21/2015  Admission Diagnoses: Lumbar pedicle fracture after fall with loosening of instrumentation   Discharge Diagnoses: Same   Discharged Condition: good  Hospital Course: The patient was admitted on 03/17/2015 and taken to the operating room where the patient underwent exploration of lumbar fracture with extension of fusion. The patient tolerated the procedure well and was taken to the recovery room and then to the floor in stable condition. The hospital course was routine. There were no complications. The wound remained clean dry and intact. Pt had appropriate back soreness. No complaints of leg pain or new N/T/W. The patient remained afebrile with stable vital signs, and tolerated a regular diet. The patient continued to increase activities, and pain was well controlled with oral pain medications.   Consults: None  Significant Diagnostic Studies:  Results for orders placed or performed during the hospital encounter of 03/17/15  Urinalysis, Routine w reflex microscopic  Result Value Ref Range   Color, Urine YELLOW YELLOW   APPearance CLEAR CLEAR   Specific Gravity, Urine 1.005 1.005 - 1.030   pH 5.5 5.0 - 8.0   Glucose, UA NEGATIVE NEGATIVE mg/dL   Hgb urine dipstick SMALL (A) NEGATIVE   Bilirubin Urine NEGATIVE NEGATIVE   Ketones, ur NEGATIVE NEGATIVE mg/dL   Protein, ur NEGATIVE NEGATIVE mg/dL   Urobilinogen, UA 0.2 0.0 - 1.0 mg/dL   Nitrite NEGATIVE NEGATIVE   Leukocytes, UA NEGATIVE NEGATIVE  Urine microscopic-add on  Result Value Ref Range   Squamous Epithelial / LPF RARE RARE   WBC, UA 0-2 <3 WBC/hpf   RBC / HPF 3-6 <3 RBC/hpf   Bacteria, UA RARE RARE    Dg Lumbar Spine 2-3 Views  03/17/2015   CLINICAL DATA:  Imaging for lumbar spine fusion.  EXAM: LUMBAR SPINE - 2-3 VIEW; DG C-ARM 61-120 MIN  COMPARISON:  Lumbar CT,  02/18/2015.  FLUOROSCOPY TIME:  Radiation Exposure Index (as provided by the fluoroscopic device):  If the device does not provide the exposure index:  Fluoroscopy Time:  0 minutes and 34 seconds  Number of Acquired Images:  2  FINDINGS: Images show extension of the fusion. Pedicle screws are now noted in L3, L4 and S1 on the right and L3, L4, L5 and S1 on the left. The screw noted in the fractured pedicle of L5 on the right on the prior imaging has been removed.  Interconnecting rods span the pedicle screws. The orthopedic hardware appears well seated and aligned. Radiolucent disc spacers maintained disc height at L3-L4 and L4-L5. Preview anterolisthesis of L4 on L5 and L5 on S1 is stable.  There are no new fractures or evidence of an operative complication.  IMPRESSION: Imaging for lumbar spine fusion as detailed.   Electronically Signed   By: Amie Portland M.D.   On: 03/17/2015 16:52   Dg C-arm 1-60 Min  03/17/2015   CLINICAL DATA:  Imaging for lumbar spine fusion.  EXAM: LUMBAR SPINE - 2-3 VIEW; DG C-ARM 61-120 MIN  COMPARISON:  Lumbar CT, 02/18/2015.  FLUOROSCOPY TIME:  Radiation Exposure Index (as provided by the fluoroscopic device):  If the device does not provide the exposure index:  Fluoroscopy Time:  0 minutes and 34 seconds  Number of Acquired Images:  2  FINDINGS: Images show extension of the fusion. Pedicle screws are now noted in L3, L4 and S1 on the right and L3, L4, L5  and S1 on the left. The screw noted in the fractured pedicle of L5 on the right on the prior imaging has been removed.  Interconnecting rods span the pedicle screws. The orthopedic hardware appears well seated and aligned. Radiolucent disc spacers maintained disc height at L3-L4 and L4-L5. Preview anterolisthesis of L4 on L5 and L5 on S1 is stable.  There are no new fractures or evidence of an operative complication.  IMPRESSION: Imaging for lumbar spine fusion as detailed.   Electronically Signed   By: Amie Portlandavid  Ormond M.D.   On:  03/17/2015 16:52   Mm Screening Breast Tomo Bilateral  03/12/2015   CLINICAL DATA:  Screening.  EXAM: DIGITAL SCREENING BILATERAL MAMMOGRAM WITH 3D TOMO WITH CAD  COMPARISON:  Previous exam(s).  ACR Breast Density Category b: There are scattered areas of fibroglandular density.  FINDINGS: There are no findings suspicious for malignancy. Images were processed with CAD.  IMPRESSION: No mammographic evidence of malignancy. A result letter of this screening mammogram will be mailed directly to the patient.  RECOMMENDATION: Screening mammogram in one year. (Code:[id number]-B-01Y)  BI-RADS CATEGORY  1: Negative.   Electronically Signed   By: Norva PavlovElizabeth  Brown M.D.   On: 03/12/2015 15:16   Dg Hip Unilat With Pelvis 2-3 Views Right  03/11/2015   CLINICAL DATA:  Right hip pain for several months, history of falls  EXAM: RIGHT HIP (WITH PELVIS) 2-3 VIEWS  COMPARISON:  CT lumbar spine of 02/18/2015  FINDINGS: Both hip joint spaces appear relatively normal for age. No significant degenerative change is seen. The pelvic rami are intact. The SI joints are corticated. Hardware for fusion of the lower lumbar spine is noted.  IMPRESSION: Negative hips.   Electronically Signed   By: Dwyane DeePaul  Barry M.D.   On: 03/11/2015 11:25    Antibiotics:  Anti-infectives    Start     Dose/Rate Route Frequency Ordered Stop   03/17/15 2000  ceFAZolin (ANCEF) IVPB 1 g/50 mL premix     1 g 100 mL/hr over 30 Minutes Intravenous Every 8 hours 03/17/15 1851 03/18/15 0428   03/17/15 1330  bacitracin 50,000 Units in sodium chloride irrigation 0.9 % 500 mL irrigation  Status:  Discontinued       As needed 03/17/15 1423 03/17/15 1642   03/17/15 1200  ceFAZolin (ANCEF) IVPB 2 g/50 mL premix     2 g 100 mL/hr over 30 Minutes Intravenous To Surgery 03/16/15 1416 03/17/15 1345      Discharge Exam: Blood pressure 108/53, pulse 72, temperature 98.6 F (37 C), temperature source Oral, resp. rate 20, height 5' 4.5" (1.638 m), weight 151 lb (68.493 kg),  SpO2 99 %. Neurologic: Grossly normal Incision clean dry and intact  Discharge Medications:     Medication List    STOP taking these medications        HYDROcodone-acetaminophen 10-325 MG per tablet  Commonly known as:  NORCO     HYDROcodone-acetaminophen 5-325 MG per tablet  Commonly known as:  NORCO     meloxicam 15 MG tablet  Commonly known as:  MOBIC     methocarbamol 500 MG tablet  Commonly known as:  ROBAXIN  Replaced by:  tiZANidine 4 MG tablet     naproxen sodium 220 MG tablet  Commonly known as:  ANAPROX      TAKE these medications        ALPHAGAN P 0.1 % Soln  Generic drug:  brimonidine  Place 1 drop into both eyes every 12 (twelve)  hours.     ALPRAZolam 0.25 MG tablet  Commonly known as:  XANAX  Take 1 tablet (0.25 mg total) by mouth 2 (two) times daily as needed for anxiety. Do not take if not needed for anxiety.     atorvastatin 40 MG tablet  Commonly known as:  LIPITOR  take 1 tablet by mouth once daily     butalbital-acetaminophen-caffeine 50-325-40 MG per tablet  Commonly known as:  FIORICET, ESGIC  TAKE 1 TABLET BY MOUTH TWICE DAILY AS NEEDED FOR HEADACHE     CALCIUM 600 + D PO  Take 1 tablet by mouth 2 (two) times daily.     conjugated estrogens vaginal cream  Commonly known as:  PREMARIN  Place vaginally daily.     dorzolamide-timolol 22.3-6.8 MG/ML ophthalmic solution  Commonly known as:  COSOPT  Place 1 drop into both eyes 2 (two) times daily.     esomeprazole 40 MG capsule  Commonly known as:  NEXIUM  Take 1 capsule (40 mg total) by mouth daily before breakfast.     fentaNYL 50 MCG/HR  Commonly known as:  DURAGESIC - dosed mcg/hr  Place 1 patch (50 mcg total) onto the skin every 3 (three) days.     Fish Oil 1000 MG Caps  Take 1 capsule by mouth 2 (two) times daily.     FLUOCINOLONE ACETONIDE SCALP 0.01 % Oil  Apply 1 application topically daily as needed.     gabapentin 300 MG capsule  Commonly known as:  NEURONTIN  1  capsule po qam; 2 capsules po in afternoon, 2 capsules po qhs     glucosamine-chondroitin 500-400 MG tablet  Take 1 tablet by mouth 2 (two) times daily.     hydrochlorothiazide 12.5 MG capsule  Commonly known as:  MICROZIDE  Take 1 capsule (12.5 mg total) by mouth daily.     Ketoprofen Powd  - Ketoprofen gel 20%  - aaa tid     latanoprost 0.005 % ophthalmic solution  Commonly known as:  XALATAN  Place 1 drop into both eyes at bedtime.     lisinopril 20 MG tablet  Commonly known as:  PRINIVIL,ZESTRIL  Take 1 tablet (20 mg total) by mouth daily.     loratadine 10 MG tablet  Commonly known as:  CLARITIN  Take 10 mg by mouth daily as needed for allergies.     multivitamin with minerals tablet  Take 1 tablet by mouth daily.     oxybutynin 10 MG 24 hr tablet  Commonly known as:  DITROPAN-XL  Take 1 tablet (10 mg total) by mouth daily.     Oxycodone HCl 10 MG Tabs  Take 1 tablet (10 mg total) by mouth every 4 (four) hours as needed (Pt prefers Twice daily as needed).     sertraline 100 MG tablet  Commonly known as:  ZOLOFT  Take 1 tablet (100 mg total) by mouth daily.     tiZANidine 4 MG tablet  Commonly known as:  ZANAFLEX  Take 1 tablet (4 mg total) by mouth every 8 (eight) hours as needed.     triamcinolone 0.025 % cream  Commonly known as:  KENALOG  Apply 1 application topically 2 (two) times daily as needed.        Disposition: Home   Final Dx: Lumbar laminectomy and Extension of lumbar fusion      Discharge Instructions     Remove dressing in 72 hours    Complete by:  As directed  Call MD for:  difficulty breathing, headache or visual disturbances    Complete by:  As directed      Call MD for:  persistant nausea and vomiting    Complete by:  As directed      Call MD for:  redness, tenderness, or signs of infection (pain, swelling, redness, odor or green/yellow discharge around incision site)    Complete by:  As directed      Call MD for:  severe  uncontrolled pain    Complete by:  As directed      Call MD for:  temperature >100.4    Complete by:  As directed      Diet - low sodium heart healthy    Complete by:  As directed      Discharge instructions    Complete by:  As directed   Driving, no heavy lifting, no bending or twisting, no strenuous activity, may shower     Increase activity slowly    Complete by:  As directed            Follow-up Information    Follow up with Denzel Etienne S, MD In 2 weeks.   Specialty:  Neurosurgery   Contact information:   [address] [place] [address] [phone]        Signed: Tia Alert 03/21/2015, 4:57 PM

## 2015-03-21 NOTE — Progress Notes (Signed)
Physical Therapy Treatment Patient Details Name: Melissa Gonzales MRN: [id number] DOB: [birth year] Today's Date: 03/21/2015    History of Present Illness 74yo female s/p lumbar five -sacral one Maximum access posterior lumbar interbody fusion/Lumbar two-three [facility]  PMH: GERD Anxiety, depression, glaucoma, diverticulosis, arthritis, jan 2016 back surg    PT Comments    Pt with improved ambulation tolerance and ability to transfer but con't to have c/o 7/10 surgical back pain. Acute PT to con't to follow.  Follow Up Recommendations  Home health PT;Supervision/Assistance - 24 hour     Equipment Recommendations  None recommended by PT    Recommendations for Other Services       Precautions / Restrictions Precautions Precautions: Back Precaution Booklet Issued: Yes (comment) Precaution Comments: pt able to recall all precautions Required Braces or Orthoses: Spinal Brace Spinal Brace: Lumbar corset;Applied in sitting position (brace elevated, re-adjusted at EOB) Restrictions Weight Bearing Restrictions: No    Mobility  Bed Mobility               General bed mobility comments: pt up upon PT arrival  Transfers Overall transfer level: Needs assistance Equipment used: Rolling walker (2 wheeled) Transfers: Sit to/from Stand Sit to Stand: Supervision         General transfer comment: pt demo'd safe technique  Ambulation/Gait Ambulation/Gait assistance: Supervision Ambulation Distance (Feet): 200 Feet Assistive device: Rolling walker (2 wheeled) Gait Pattern/deviations: Step-through pattern Gait velocity: improved from yesterday   General Gait Details: v/c's to stay in walker, contract abdominal muscles to able take pressure off back and achieve upright posture   Stairs Stairs: Yes Stairs assistance: Supervision Stair Management: Two rails;Alternating pattern Number of Stairs: 3 General stair comments:  completed 2 trials, v/c's for sequencing  Wheelchair Mobility    Modified Rankin (Stroke Patients Only)       Balance Overall balance assessment: Needs assistance         Standing balance support: Bilateral upper extremity supported Standing balance-Leahy Scale: Fair Standing balance comment: pt able to wash hands at sink, however fatigued quickly and increased trunk flexion                    Cognition Arousal/Alertness: Awake/alert Behavior During Therapy: WFL for tasks assessed/performed Overall Cognitive Status: Within Functional Limits for tasks assessed                      Exercises      General Comments        Pertinent Vitals/Pain Pain Assessment: 0-10 Pain Score: 7  Pain Location: surgical site Pain Descriptors / Indicators: Aching;Constant Pain Intervention(s): Monitored during session    Home Living                      Prior Function            PT Goals (current goals can now be found in the care plan section) Progress towards PT goals: Progressing toward goals    Frequency  Min 5X/week    PT Plan Current plan remains appropriate    Co-evaluation             End of Session Equipment Utilized During Treatment: Gait belt;Back brace Activity Tolerance: Patient tolerated treatment well Patient left: in chair;with call bell/phone within reach;with chair alarm set     Time: [id number] PT Time Calculation (min) (ACUTE ONLY): 27 min  Charges:  $Gait Training: 23-37  mins                    G Codes:      Marcene BrawnChadwell, Gabrielle Wakeland Marie 03/21/2015, 9:44 AM   Lewis ShockAshly Ireoluwa Grant, PT, DPT Pager #: [phone] Office #: [phone]

## 2015-03-21 NOTE — Progress Notes (Signed)
D/C orders received, pt for D/C home today with [facility].  IV and telemetry D/C.  Rx and D/C instructions given with verbalized understanding.  Family at bedside to assist with D/C.  Staff brought pt downstairs via wheelchair.  

## 2015-03-23 NOTE — Progress Notes (Signed)
Spoke with patient via phone to discuss home health therapies. Patient is agreeable and has chosen [facility]. Miranda with [facility] was notified and accepted the referral. Address and phone number were verified and are correct in the system.

## 2015-03-24 DIAGNOSIS — Z96698 Presence of other orthopedic joint implants: Secondary | ICD-10-CM | POA: Diagnosis not present

## 2015-03-24 DIAGNOSIS — M4316 Spondylolisthesis, lumbar region: Secondary | ICD-10-CM | POA: Diagnosis not present

## 2015-03-24 DIAGNOSIS — S32059D Unspecified fracture of fifth lumbar vertebra, subsequent encounter for fracture with routine healing: Secondary | ICD-10-CM | POA: Diagnosis not present

## 2015-03-24 DIAGNOSIS — M4806 Spinal stenosis, lumbar region: Secondary | ICD-10-CM | POA: Diagnosis not present

## 2015-03-24 DIAGNOSIS — F329 Major depressive disorder, single episode, unspecified: Secondary | ICD-10-CM | POA: Diagnosis not present

## 2015-03-24 DIAGNOSIS — I10 Essential (primary) hypertension: Secondary | ICD-10-CM | POA: Diagnosis not present

## 2015-03-24 DIAGNOSIS — F419 Anxiety disorder, unspecified: Secondary | ICD-10-CM | POA: Diagnosis not present

## 2015-03-24 DIAGNOSIS — Z9181 History of falling: Secondary | ICD-10-CM | POA: Diagnosis not present

## 2015-03-26 DIAGNOSIS — M4806 Spinal stenosis, lumbar region: Secondary | ICD-10-CM | POA: Diagnosis not present

## 2015-03-26 DIAGNOSIS — Z96698 Presence of other orthopedic joint implants: Secondary | ICD-10-CM | POA: Diagnosis not present

## 2015-03-26 DIAGNOSIS — F329 Major depressive disorder, single episode, unspecified: Secondary | ICD-10-CM | POA: Diagnosis not present

## 2015-03-26 DIAGNOSIS — I10 Essential (primary) hypertension: Secondary | ICD-10-CM | POA: Diagnosis not present

## 2015-03-26 DIAGNOSIS — Z9181 History of falling: Secondary | ICD-10-CM | POA: Diagnosis not present

## 2015-03-26 DIAGNOSIS — F419 Anxiety disorder, unspecified: Secondary | ICD-10-CM | POA: Diagnosis not present

## 2015-03-26 DIAGNOSIS — S32059D Unspecified fracture of fifth lumbar vertebra, subsequent encounter for fracture with routine healing: Secondary | ICD-10-CM | POA: Diagnosis not present

## 2015-03-26 DIAGNOSIS — M4316 Spondylolisthesis, lumbar region: Secondary | ICD-10-CM | POA: Diagnosis not present

## 2015-03-27 DIAGNOSIS — Z96698 Presence of other orthopedic joint implants: Secondary | ICD-10-CM | POA: Diagnosis not present

## 2015-03-27 DIAGNOSIS — F329 Major depressive disorder, single episode, unspecified: Secondary | ICD-10-CM | POA: Diagnosis not present

## 2015-03-27 DIAGNOSIS — M4806 Spinal stenosis, lumbar region: Secondary | ICD-10-CM | POA: Diagnosis not present

## 2015-03-27 DIAGNOSIS — M4316 Spondylolisthesis, lumbar region: Secondary | ICD-10-CM | POA: Diagnosis not present

## 2015-03-27 DIAGNOSIS — I10 Essential (primary) hypertension: Secondary | ICD-10-CM | POA: Diagnosis not present

## 2015-03-27 DIAGNOSIS — F419 Anxiety disorder, unspecified: Secondary | ICD-10-CM | POA: Diagnosis not present

## 2015-03-27 DIAGNOSIS — S32059D Unspecified fracture of fifth lumbar vertebra, subsequent encounter for fracture with routine healing: Secondary | ICD-10-CM | POA: Diagnosis not present

## 2015-03-27 DIAGNOSIS — Z9181 History of falling: Secondary | ICD-10-CM | POA: Diagnosis not present

## 2015-03-29 DIAGNOSIS — F419 Anxiety disorder, unspecified: Secondary | ICD-10-CM | POA: Diagnosis not present

## 2015-03-29 DIAGNOSIS — F329 Major depressive disorder, single episode, unspecified: Secondary | ICD-10-CM | POA: Diagnosis not present

## 2015-03-29 DIAGNOSIS — M4806 Spinal stenosis, lumbar region: Secondary | ICD-10-CM | POA: Diagnosis not present

## 2015-03-29 DIAGNOSIS — I10 Essential (primary) hypertension: Secondary | ICD-10-CM | POA: Diagnosis not present

## 2015-03-29 DIAGNOSIS — Z96698 Presence of other orthopedic joint implants: Secondary | ICD-10-CM | POA: Diagnosis not present

## 2015-03-29 DIAGNOSIS — Z9181 History of falling: Secondary | ICD-10-CM | POA: Diagnosis not present

## 2015-03-29 DIAGNOSIS — S32059D Unspecified fracture of fifth lumbar vertebra, subsequent encounter for fracture with routine healing: Secondary | ICD-10-CM | POA: Diagnosis not present

## 2015-03-29 DIAGNOSIS — M4316 Spondylolisthesis, lumbar region: Secondary | ICD-10-CM | POA: Diagnosis not present

## 2015-03-30 DIAGNOSIS — M4316 Spondylolisthesis, lumbar region: Secondary | ICD-10-CM | POA: Diagnosis not present

## 2015-03-30 DIAGNOSIS — M4806 Spinal stenosis, lumbar region: Secondary | ICD-10-CM | POA: Diagnosis not present

## 2015-03-30 DIAGNOSIS — I10 Essential (primary) hypertension: Secondary | ICD-10-CM | POA: Diagnosis not present

## 2015-03-30 DIAGNOSIS — F329 Major depressive disorder, single episode, unspecified: Secondary | ICD-10-CM | POA: Diagnosis not present

## 2015-03-30 DIAGNOSIS — F419 Anxiety disorder, unspecified: Secondary | ICD-10-CM | POA: Diagnosis not present

## 2015-03-30 DIAGNOSIS — Z96698 Presence of other orthopedic joint implants: Secondary | ICD-10-CM | POA: Diagnosis not present

## 2015-03-30 DIAGNOSIS — S32059D Unspecified fracture of fifth lumbar vertebra, subsequent encounter for fracture with routine healing: Secondary | ICD-10-CM | POA: Diagnosis not present

## 2015-03-30 DIAGNOSIS — Z9181 History of falling: Secondary | ICD-10-CM | POA: Diagnosis not present

## 2015-03-31 DIAGNOSIS — I10 Essential (primary) hypertension: Secondary | ICD-10-CM | POA: Diagnosis not present

## 2015-03-31 DIAGNOSIS — Z96698 Presence of other orthopedic joint implants: Secondary | ICD-10-CM | POA: Diagnosis not present

## 2015-03-31 DIAGNOSIS — Z9181 History of falling: Secondary | ICD-10-CM | POA: Diagnosis not present

## 2015-03-31 DIAGNOSIS — M4316 Spondylolisthesis, lumbar region: Secondary | ICD-10-CM | POA: Diagnosis not present

## 2015-03-31 DIAGNOSIS — F329 Major depressive disorder, single episode, unspecified: Secondary | ICD-10-CM | POA: Diagnosis not present

## 2015-03-31 DIAGNOSIS — M4806 Spinal stenosis, lumbar region: Secondary | ICD-10-CM | POA: Diagnosis not present

## 2015-03-31 DIAGNOSIS — S32059D Unspecified fracture of fifth lumbar vertebra, subsequent encounter for fracture with routine healing: Secondary | ICD-10-CM | POA: Diagnosis not present

## 2015-03-31 DIAGNOSIS — F419 Anxiety disorder, unspecified: Secondary | ICD-10-CM | POA: Diagnosis not present

## 2015-04-05 DIAGNOSIS — M4316 Spondylolisthesis, lumbar region: Secondary | ICD-10-CM | POA: Diagnosis not present

## 2015-04-05 DIAGNOSIS — Z9181 History of falling: Secondary | ICD-10-CM | POA: Diagnosis not present

## 2015-04-05 DIAGNOSIS — S32059D Unspecified fracture of fifth lumbar vertebra, subsequent encounter for fracture with routine healing: Secondary | ICD-10-CM | POA: Diagnosis not present

## 2015-04-05 DIAGNOSIS — I10 Essential (primary) hypertension: Secondary | ICD-10-CM | POA: Diagnosis not present

## 2015-04-05 DIAGNOSIS — F419 Anxiety disorder, unspecified: Secondary | ICD-10-CM | POA: Diagnosis not present

## 2015-04-05 DIAGNOSIS — M4806 Spinal stenosis, lumbar region: Secondary | ICD-10-CM | POA: Diagnosis not present

## 2015-04-05 DIAGNOSIS — Z96698 Presence of other orthopedic joint implants: Secondary | ICD-10-CM | POA: Diagnosis not present

## 2015-04-05 DIAGNOSIS — F329 Major depressive disorder, single episode, unspecified: Secondary | ICD-10-CM | POA: Diagnosis not present

## 2015-04-06 DIAGNOSIS — Z9181 History of falling: Secondary | ICD-10-CM | POA: Diagnosis not present

## 2015-04-06 DIAGNOSIS — Z96698 Presence of other orthopedic joint implants: Secondary | ICD-10-CM | POA: Diagnosis not present

## 2015-04-06 DIAGNOSIS — M4316 Spondylolisthesis, lumbar region: Secondary | ICD-10-CM | POA: Diagnosis not present

## 2015-04-06 DIAGNOSIS — F419 Anxiety disorder, unspecified: Secondary | ICD-10-CM | POA: Diagnosis not present

## 2015-04-06 DIAGNOSIS — M4806 Spinal stenosis, lumbar region: Secondary | ICD-10-CM | POA: Diagnosis not present

## 2015-04-06 DIAGNOSIS — I10 Essential (primary) hypertension: Secondary | ICD-10-CM | POA: Diagnosis not present

## 2015-04-06 DIAGNOSIS — F329 Major depressive disorder, single episode, unspecified: Secondary | ICD-10-CM | POA: Diagnosis not present

## 2015-04-06 DIAGNOSIS — S32059D Unspecified fracture of fifth lumbar vertebra, subsequent encounter for fracture with routine healing: Secondary | ICD-10-CM | POA: Diagnosis not present

## 2015-04-08 ENCOUNTER — Encounter (HOSPITAL_COMMUNITY): Payer: Self-pay | Admitting: *Deleted

## 2015-04-08 ENCOUNTER — Inpatient Hospital Stay (HOSPITAL_COMMUNITY)
Admission: EM | Admit: 2015-04-08 | Discharge: 2015-04-16 | DRG: 543 | Disposition: A | Discharge status: Home Health | Payer: Medicare Other | Attending: Neurological Surgery | Admitting: Neurological Surgery

## 2015-04-08 ENCOUNTER — Emergency Department (HOSPITAL_COMMUNITY): Payer: Medicare Other

## 2015-04-08 DIAGNOSIS — R52 Pain, unspecified: Secondary | ICD-10-CM | POA: Diagnosis present

## 2015-04-08 DIAGNOSIS — Z981 Arthrodesis status: Secondary | ICD-10-CM

## 2015-04-08 DIAGNOSIS — Z90710 Acquired absence of both cervix and uterus: Secondary | ICD-10-CM

## 2015-04-08 DIAGNOSIS — G43909 Migraine, unspecified, not intractable, without status migrainosus: Secondary | ICD-10-CM | POA: Diagnosis present

## 2015-04-08 DIAGNOSIS — M549 Dorsalgia, unspecified: Secondary | ICD-10-CM | POA: Diagnosis present

## 2015-04-08 DIAGNOSIS — M545 Low back pain, unspecified: Secondary | ICD-10-CM

## 2015-04-08 DIAGNOSIS — Z8701 Personal history of pneumonia (recurrent): Secondary | ICD-10-CM

## 2015-04-08 DIAGNOSIS — G4700 Insomnia, unspecified: Secondary | ICD-10-CM | POA: Diagnosis present

## 2015-04-08 DIAGNOSIS — T84226A Displacement of internal fixation device of vertebrae, initial encounter: Secondary | ICD-10-CM | POA: Diagnosis present

## 2015-04-08 DIAGNOSIS — I10 Essential (primary) hypertension: Secondary | ICD-10-CM | POA: Diagnosis present

## 2015-04-08 DIAGNOSIS — Z9049 Acquired absence of other specified parts of digestive tract: Secondary | ICD-10-CM | POA: Diagnosis present

## 2015-04-08 DIAGNOSIS — Z79899 Other long term (current) drug therapy: Secondary | ICD-10-CM

## 2015-04-08 DIAGNOSIS — K219 Gastro-esophageal reflux disease without esophagitis: Secondary | ICD-10-CM | POA: Diagnosis present

## 2015-04-08 DIAGNOSIS — M5441 Lumbago with sciatica, right side: Secondary | ICD-10-CM

## 2015-04-08 DIAGNOSIS — F329 Major depressive disorder, single episode, unspecified: Secondary | ICD-10-CM | POA: Diagnosis present

## 2015-04-08 DIAGNOSIS — E785 Hyperlipidemia, unspecified: Secondary | ICD-10-CM | POA: Diagnosis present

## 2015-04-08 DIAGNOSIS — M47896 Other spondylosis, lumbar region: Secondary | ICD-10-CM | POA: Diagnosis present

## 2015-04-08 DIAGNOSIS — M1990 Unspecified osteoarthritis, unspecified site: Secondary | ICD-10-CM | POA: Diagnosis present

## 2015-04-08 DIAGNOSIS — M8448XA Pathological fracture, other site, initial encounter for fracture: Secondary | ICD-10-CM | POA: Diagnosis not present

## 2015-04-08 DIAGNOSIS — N3941 Urge incontinence: Secondary | ICD-10-CM | POA: Diagnosis present

## 2015-04-08 DIAGNOSIS — M9669 Fracture of other bone following insertion of orthopedic implant, joint prosthesis, or bone plate: Secondary | ICD-10-CM | POA: Diagnosis present

## 2015-04-08 DIAGNOSIS — H409 Unspecified glaucoma: Secondary | ICD-10-CM | POA: Diagnosis present

## 2015-04-08 DIAGNOSIS — F411 Generalized anxiety disorder: Secondary | ICD-10-CM | POA: Diagnosis present

## 2015-04-08 DIAGNOSIS — Y838 Other surgical procedures as the cause of abnormal reaction of the patient, or of later complication, without mention of misadventure at the time of the procedure: Secondary | ICD-10-CM | POA: Diagnosis present

## 2015-04-08 DIAGNOSIS — Z8249 Family history of ischemic heart disease and other diseases of the circulatory system: Secondary | ICD-10-CM

## 2015-04-08 DIAGNOSIS — R339 Retention of urine, unspecified: Secondary | ICD-10-CM | POA: Diagnosis not present

## 2015-04-08 MED ORDER — HYDROMORPHONE HCL 1 MG/ML IJ SOLN
0.5000 mg | INTRAMUSCULAR | Status: DC | PRN
  Administered 2015-04-08 – 2015-04-11 (×6): 0.5 mg via INTRAVENOUS
  Filled 2015-04-08 (×8): qty 1

## 2015-04-08 MED ORDER — HYDROCHLOROTHIAZIDE 12.5 MG PO CAPS
12.5000 mg | ORAL_CAPSULE | Freq: Every day | ORAL | Status: DC
  Administered 2015-04-09 – 2015-04-16 (×8): 12.5 mg via ORAL
  Filled 2015-04-08 (×8): qty 1

## 2015-04-08 MED ORDER — ALPRAZOLAM 0.25 MG PO TABS
0.2500 mg | ORAL_TABLET | Freq: Two times a day (BID) | ORAL | Status: DC | PRN
  Administered 2015-04-09 – 2015-04-11 (×4): 0.25 mg via ORAL
  Filled 2015-04-08 (×4): qty 1

## 2015-04-08 MED ORDER — CALCIUM CARBONATE-VITAMIN D 500-200 MG-UNIT PO TABS
1.0000 | ORAL_TABLET | Freq: Two times a day (BID) | ORAL | Status: DC
  Administered 2015-04-09 – 2015-04-13 (×10): 1 via ORAL
  Filled 2015-04-08 (×11): qty 1

## 2015-04-08 MED ORDER — LORATADINE 10 MG PO TABS
10.0000 mg | ORAL_TABLET | Freq: Every day | ORAL | Status: DC | PRN
  Filled 2015-04-08: qty 1

## 2015-04-08 MED ORDER — LISINOPRIL 20 MG PO TABS
20.0000 mg | ORAL_TABLET | Freq: Every day | ORAL | Status: DC
  Administered 2015-04-09 – 2015-04-16 (×8): 20 mg via ORAL
  Filled 2015-04-08 (×8): qty 1

## 2015-04-08 MED ORDER — HYDROMORPHONE HCL 1 MG/ML IJ SOLN
1.0000 mg | Freq: Once | INTRAMUSCULAR | Status: AC
  Administered 2015-04-08: 1 mg via INTRAVENOUS
  Filled 2015-04-08: qty 1

## 2015-04-08 MED ORDER — BRIMONIDINE TARTRATE 0.2 % OP SOLN
1.0000 [drp] | Freq: Two times a day (BID) | OPHTHALMIC | Status: DC
Start: 2015-04-08 — End: 2015-04-16
  Administered 2015-04-09 – 2015-04-16 (×16): 1 [drp] via OPHTHALMIC
  Filled 2015-04-08 (×2): qty 5

## 2015-04-08 MED ORDER — ADULT MULTIVITAMIN W/MINERALS CH
1.0000 | ORAL_TABLET | Freq: Every day | ORAL | Status: DC
  Administered 2015-04-09 – 2015-04-16 (×8): 1 via ORAL
  Filled 2015-04-08 (×8): qty 1

## 2015-04-08 MED ORDER — ATORVASTATIN CALCIUM 40 MG PO TABS
40.0000 mg | ORAL_TABLET | Freq: Every day | ORAL | Status: DC
  Administered 2015-04-09 – 2015-04-15 (×7): 40 mg via ORAL
  Filled 2015-04-08 (×8): qty 1

## 2015-04-08 MED ORDER — DORZOLAMIDE HCL-TIMOLOL MAL 2-0.5 % OP SOLN
1.0000 [drp] | Freq: Two times a day (BID) | OPHTHALMIC | Status: DC
  Administered 2015-04-09 – 2015-04-16 (×16): 1 [drp] via OPHTHALMIC
  Filled 2015-04-08 (×2): qty 10

## 2015-04-08 MED ORDER — TIZANIDINE HCL 4 MG PO TABS
4.0000 mg | ORAL_TABLET | Freq: Three times a day (TID) | ORAL | Status: DC | PRN
  Administered 2015-04-15 – 2015-04-16 (×3): 4 mg via ORAL
  Filled 2015-04-08 (×6): qty 1

## 2015-04-08 MED ORDER — SERTRALINE HCL 100 MG PO TABS
100.0000 mg | ORAL_TABLET | Freq: Every day | ORAL | Status: DC
  Administered 2015-04-09 – 2015-04-16 (×8): 100 mg via ORAL
  Filled 2015-04-08 (×8): qty 1

## 2015-04-08 MED ORDER — FENTANYL 25 MCG/HR TD PT72
50.0000 ug | MEDICATED_PATCH | TRANSDERMAL | Status: DC
  Administered 2015-04-08 – 2015-04-14 (×3): 50 ug via TRANSDERMAL
  Filled 2015-04-08 (×3): qty 2

## 2015-04-08 MED ORDER — FENTANYL CITRATE (PF) 100 MCG/2ML IJ SOLN
50.0000 ug | Freq: Once | INTRAMUSCULAR | Status: DC
Start: 2015-04-08 — End: 2015-04-08
  Filled 2015-04-08: qty 2

## 2015-04-08 MED ORDER — KETOROLAC TROMETHAMINE 30 MG/ML IJ SOLN
30.0000 mg | Freq: Once | INTRAMUSCULAR | Status: AC
  Administered 2015-04-08: 30 mg via INTRAVENOUS
  Filled 2015-04-08: qty 1

## 2015-04-08 MED ORDER — SODIUM CHLORIDE 0.9 % IV SOLN: 250.0000 mL | INTRAVENOUS | Status: DC | PRN

## 2015-04-08 MED ORDER — LATANOPROST 0.005 % OP SOLN
1.0000 [drp] | Freq: Every day | OPHTHALMIC | Status: DC
  Administered 2015-04-09 – 2015-04-15 (×8): 1 [drp] via OPHTHALMIC
  Filled 2015-04-08 (×2): qty 2.5

## 2015-04-08 MED ORDER — HEPARIN SODIUM (PORCINE) 5000 UNIT/ML IJ SOLN
5000.0000 [IU] | Freq: Three times a day (TID) | INTRAMUSCULAR | Status: DC
  Administered 2015-04-08 – 2015-04-09 (×2): 5000 [IU] via SUBCUTANEOUS
  Filled 2015-04-08 (×4): qty 1

## 2015-04-08 MED ORDER — ALPRAZOLAM 0.25 MG PO TABS
0.2500 mg | ORAL_TABLET | Freq: Two times a day (BID) | ORAL | Status: DC | PRN
  Administered 2015-04-08: 0.25 mg via ORAL
  Filled 2015-04-08: qty 1

## 2015-04-08 MED ORDER — SODIUM CHLORIDE 0.9 % IJ SOLN: 3.0000 mL | INTRAMUSCULAR | Status: DC | PRN

## 2015-04-08 MED ORDER — OXYCODONE HCL 5 MG PO TABS
10.0000 mg | ORAL_TABLET | ORAL | Status: DC | PRN
Start: 2015-04-08 — End: 2015-04-16
  Administered 2015-04-08 – 2015-04-16 (×38): 10 mg via ORAL
  Filled 2015-04-08 (×40): qty 2

## 2015-04-08 MED ORDER — PANTOPRAZOLE SODIUM 40 MG PO TBEC
40.0000 mg | DELAYED_RELEASE_TABLET | Freq: Every day | ORAL | Status: DC
  Administered 2015-04-09 – 2015-04-16 (×8): 40 mg via ORAL
  Filled 2015-04-08 (×8): qty 1

## 2015-04-08 MED ORDER — HYDROMORPHONE HCL 1 MG/ML IJ SOLN
0.5000 mg | Freq: Once | INTRAMUSCULAR | Status: AC
Start: 2015-04-08 — End: 2015-04-08
  Administered 2015-04-08: 0.5 mg via INTRAVENOUS
  Filled 2015-04-08: qty 1

## 2015-04-08 MED ORDER — SODIUM CHLORIDE 0.9 % IJ SOLN
3.0000 mL | Freq: Two times a day (BID) | INTRAMUSCULAR | Status: DC
  Administered 2015-04-08 – 2015-04-16 (×13): 3 mL via INTRAVENOUS

## 2015-04-08 MED ORDER — OMEGA-3-ACID ETHYL ESTERS 1 G PO CAPS
1.0000 g | ORAL_CAPSULE | Freq: Two times a day (BID) | ORAL | Status: DC
  Administered 2015-04-09 – 2015-04-16 (×15): 1 g via ORAL
  Filled 2015-04-08 (×16): qty 1

## 2015-04-08 MED ORDER — GLUCOSAMINE-CHONDROITIN 500-400 MG PO TABS: 1.0000 | ORAL_TABLET | Freq: Two times a day (BID) | ORAL | Status: DC

## 2015-04-08 NOTE — ED Notes (Signed)
Pt reports increased pain to Back not relieved with pain meds. Pt reports a second  Back surgery 3 weeks ago to repair pins from 1st surgery done in Jan.2016.

## 2015-04-08 NOTE — ED Notes (Signed)
Pt reports this AM the pain is different. Pt reports a sharp pain that radiates into RT hip from back. PT also reports pain in Rt leg that is new. DR Marikay Alaravid Jones is the Pt MD.

## 2015-04-08 NOTE — ED Notes (Signed)
Pt. Is going to x-ray. 

## 2015-04-08 NOTE — ED Provider Notes (Signed)
CSN: [id number]     Arrival date & time 04/08/15  1017 History   First MD Initiated Contact with Patient 04/08/15 1023     Chief Complaint  Patient presents with  . Back Pain   HPI  Melissa Gonzales is a 74 yo F with PMHx of chronic back pain s/p lumbar spine fusion in January 2016 with repair in May 2016, HTN, HLD, anxiety and depression who presents to the ED with complaint of back pain. Patient awoke from sleep at 3 am with severe shooting back pain at a 10/10. Patient was unable to stand and ambulate without severe pain. She called her daughter who found her crawling on the floor. Pain is primarily located in her sacral area with radiation into her right hip and occasionally down her right leg. She denies any numbness, tingling or decreased strength. Patient took an oxycodone 10 mg once this morning with improvement in her pain from a 10 to 6. While laying still, patient's pain is a 1/10. Patient is supposed to follow up with Dr. Yetta Barre, Neurosurgery, tomorrow at 3 pm. She has had minimal pain since her surgery in May with normal activity and has been working with PT/OT. Her last session was on Tuesday. Patient denies any unusual activity, falls or trauma recently to explain her new onset back pain. She has a fentanyl patch which has been off since yesterday.   Past Medical History  Diagnosis Date  . Anxiety   . GERD (gastroesophageal reflux disease)   . Hyperlipidemia   . Hypertension   . Depression   . Glaucoma     Bilateral eyes  . Pneumonia     hx of  . Urgency of urination   . Diverticulosis   . Arthritis    Past Surgical History  Procedure Laterality Date  . Cholecystectomy  1993  . Vaginal hysterectomy  1970s    uterine prolapse  . Maximum access (mas)posterior lumbar interbody fusion (plif) 2 level N/A 11/27/2014    Procedure: LUMBAR THREE-FOUR, LUMBAR FOUR-FIVE MAXIMUM ACCESS POSTERIOR LUMBAR INTERBODY FUSION;  Surgeon: Tia Alert, MD;  Location: [facility];  Service:  Neurosurgery;  Laterality: N/A;  L3-4 L4-5 MAXIMUM ACCESS POSTERIOR LUMBAR INTERBODY FUSION  . Eye surgery      Cataract Surgery Bilateral, 5 years ago  . Colonoscopy    . Maximum access (mas)posterior lumbar interbody fusion (plif) 1 level N/A 03/17/2015    Procedure: Lumbar five -sacral one Maximum access posterior lumbar interbody fusion/Lumbar two-three  Laminectomy and extension of instrumentation to Sacral-one;  Surgeon: Tia Alert, MD;  Location: [facility];  Service: Neurosurgery;  Laterality: N/A;   Family History  Problem Relation Age of Onset  . Mental illness Daughter   . Drug abuse Grandchild   . Heart disease Mother 95    died MI age 32  . Hyperlipidemia Sister   . Hypertension Sister   . Diabetes Neg Hx   . Cancer Father     Primary Cell Liver Cancer   History  Substance Use Topics  . Smoking status: Never Smoker   . Smokeless tobacco: Not on file  . Alcohol Use: 0.6 oz/week    1 Glasses of wine per week     Comment: 1 glass per night   OB History    No data available     Review of Systems General: Denies fever, chills, and diaphoresis.  Respiratory: Denies SOB, cough Cardiovascular: Denies chest pain and palpitations.  Gastrointestinal: Denies nausea,  vomiting, abdominal pain Genitourinary: Admits to urinary incontinence. Denies dysuria, urgency, frequency Musculoskeletal: Admits to severe back pain with radiation to right hip and gait problem.  Neurological: Denies weakness, numbness  Allergies  Review of patient's allergies indicates no known allergies.  Home Medications   Prior to Admission medications   Medication Sig Start Date End Date Taking? Authorizing Provider  ALPHAGAN P 0.1 % SOLN Place 1 drop into both eyes every 12 (twelve) hours.  04/05/12   Historical Provider, MD  ALPRAZolam Prudy Feeler(XANAX) 0.25 MG tablet Take 1 tablet (0.25 mg total) by mouth 2 (two) times daily as needed for anxiety. Do not take if not needed for anxiety. 01/18/15   [place]  N Rumley, DO  atorvastatin (LIPITOR) 40 MG tablet take 1 tablet by mouth once daily 06/26/14   [facility] Hospital[facility] N Rumley, DO  butalbital-acetaminophen-caffeine ([place]ORICET, ESGIC) [phone] [id number] MG per tablet TAKE 1 TABLET BY MOUTH TWICE DAILY AS NEEDED FOR HEADACHE 08/20/14   Pincus LargeJazma Y Phelps, DO  Calcium Carb-Cholecalciferol (CALCIUM 600 + D PO) Take 1 tablet by mouth 2 (two) times daily.    Historical Provider, MD  conjugated estrogens (PREMARIN) vaginal cream Place vaginally daily. Patient not taking: Reported on 11/18/2014 10/10/12   Dayarmys Pil[place] de Criselda PeachesLa Paz, MD  dorzolamide-timolol (COSOPT) 22.3-6.8 MG/ML ophthalmic solution Place 1 drop into both eyes 2 (two) times daily.  03/20/11   Historical Provider, MD  esomeprazole (NEXIUM) 40 MG capsule Take 1 capsule (40 mg total) by mouth daily before breakfast. 05/22/14   Iona N Rumley, DO  fentaNYL (DURAGESIC - DOSED MCG/HR) 50 MCG/HR Place 1 patch (50 mcg total) onto the skin every 3 (three) days. 03/21/15   Tia Alertavid S Jones, MD  FLUOCINOLONE ACETONIDE SCALP 0.01 % OIL Apply 1 application topically daily as needed.  12/02/14   Historical Provider, MD  gabapentin (NEURONTIN) 300 MG capsule 1 capsule po qam; 2 capsules po in afternoon, 2 capsules po qhs Patient not taking: Reported on 11/18/2014 09/14/14   Lenda KelpShane R Hudnall, MD  glucosamine-chondroitin 500-400 MG tablet Take 1 tablet by mouth 2 (two) times daily.    Historical Provider, MD  hydrochlorothiazide (MICROZIDE) 12.5 MG capsule Take 1 capsule (12.5 mg total) by mouth daily. 05/04/14   Dayarmys [place] Cris[place]da PeachesLa Paz, MD  Ketoprofen POWD Ketoprofen gel 20% aaa tid Patient not taking: Reported on 11/18/2014 07/17/11   Enid BaasKarl Fields, MD  latanoprost (XALATAN) 0.005 % ophthalmic solution Place 1 drop into both eyes at bedtime.  06/21/14   Historical Provider, MD  lisinopril (PRINIVIL,ZESTRIL) 20 MG tablet Take 1 tablet (20 mg total) by mouth daily. 01/18/15   Duncan N Rumley, DO  loratadine (CLARITIN) 10 MG tablet Take 10 mg by  mouth daily as needed for allergies.     Historical Provider, MD  Multiple Vitamins-Minerals (MULTIVITAMIN WITH MINERALS) tablet Take 1 tablet by mouth daily.    Historical Provider, MD  Omega-3 Fatty Acids (FISH OIL) 1000 MG CAPS Take 1 capsule by mouth 2 (two) times daily.    Historical Provider, MD  oxybutynin (DITROPAN-XL) 10 MG 24 hr tablet Take 1 tablet (10 mg total) by mouth daily. 05/12/13   Dayarmys [place] Cris[place]da PeachesLa Paz, MD  Oxycodone HCl 10 MG TABS Take 1 tablet (10 mg total) by mouth every 4 (four) hours as needed (Pt prefers Twice daily as needed). 03/21/15   Tia Alertavid S Jones, MD  sertraline (ZOLOFT) 100 MG tablet Take 1 tablet (100 mg total) by mouth daily. 12/01/14   Araceli Bouchealeigh N Rumley,  DO  tiZANidine (ZANAFLEX) 4 MG tablet Take 1 tablet (4 mg total) by mouth every 8 (eight) hours as needed. 03/21/15   Tia Alert, MD  triamcinolone (KENALOG) 0.025 % cream Apply 1 application topically 2 (two) times daily as needed.    Historical Provider, MD   Physical Exam  Filed Vitals:   04/08/15 1415 04/08/15 1430 04/08/15 1445 04/08/15 1500  BP: 133/58 129/54 127/52 116/49  Pulse: 69 70 70 65  Temp:      TempSrc:      Resp:      Height:      Weight:      SpO2: 94% 96% 96% 96%   General: Vital signs reviewed.  Patient is well-developed and well-nourished, in no acute distress and cooperative with exam.   Cardiovascular: RRR, S1 normal, S2 normal Pulmonary/Chest: Clear to auscultation bilaterally, no wheezes, rales, or rhonchi. Abdominal: Soft, non-tender, non-distended, BS + Musculoskeletal: No joint deformities, erythema, or stiffness, ROM full and nontender. Extremities: No lower extremity edema bilaterally, pulses symmetric and intact bilaterally. Normal ROM of lower extremities bilaterally. Neurological: A&O x3, Strength 4/5 in right lower extremity with extension and flexion, 5/5 in left lower extremity, sensory intact to light touch bilaterally.  Skin: Warm, dry and intact. No rashes or  erythema. Psychiatric: Normal mood and affect. speech and behavior is normal. Cognition and memory are normal.   ED Course  Procedures (including critical care time)  Labs Review Labs Reviewed - No data to display  Imaging Review Dg Lumbar Spine 2-3 Views  04/08/2015   CLINICAL DATA:  New onset right hip pain post lumbar fusion 3 weeks ago.  EXAM: LUMBAR SPINE - 2-3 VIEW  COMPARISON:  Intraoperative radiographs of the lumbar spine -03/17/2015; lumbar spine CT -02/08/2015 lumbar spine radiographs - 01/11/2015  FINDINGS: There are 5 non rib-bearing lumbar type vertebral bodies.  There is unchanged caudal extension of previously noted paraspinal fusion hardware, now extending from L3-S1.  Interval removal of previously noted right-sided L5 pedicular screw. Bilateral pedicular screws are seen at the L3, L4 and S1 vertebral body levels. Post suspected L3 laminectomy. No definite evidence of hardware failure or loosening. Stable sequela of L3-L4 and L4-L5 intervertebral disc space replacement.  Remaining vertebral body heights appear preserved.  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  Limited visualization the bilateral SI joints is normal.  Moderate colonic stool burden without evidence of enteric obstruction. A prominent phlebolith overlies the left hemipelvis. Post cholecystectomy.  IMPRESSION: 1. Post L3-S1 paraspinal fusion including removal of the right sided L5 pedicular screw. No evidence of hardware failure or loosening. 2. 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   Electronically Signed   By: Simonne Come M.D.   On: 04/08/2015 13:28     EKG Interpretation None     MDM   Final diagnoses:  Right-sided low back pain with right-sided sciatica  Low back pain  S/P lumbar fusion   Melissa Gonzales is a 74 yo F with PMHx of chronic back pain s/p lumbar spine  fusion in January 2016 with repair in May 2016 and anxiety who presented to the ED with complaint of acute on chronic low back pain. Physical exam is overall unremarkable without signs of cauda equina syndrome. Pain sounds radicular in nature and is similar to her prior complains from previous notes. Pain is currently controlled  with oxycodone 10 mg. Patient has an appointment with Dr. Yetta Barre, neurosurgery, tomorrow at 3 pm. I have contacted Dr. Yetta Barre' office to discuss his patient with him to see if he would like any imaging prior to her appointment tomorrow afternoon. I will order oxycodone 10 mg Q4H prn pain. Pain is likely secondary to her chronic back pain, worsened by being off her fentanyl patch.    Case was discussed with Dr. Yetta Barre, patient's neurosurgeon, who recommended plain films PA and lateral of lumbar spine. Xrays showed post L3-S1 paraspinal fusion including removal of the right sided L5 pedicular screw. No evidence of hardware failure or loosening. 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 For pain control, we will order a fentanyl patch 50 mcg once until patient can see Dr. Yetta Barre tomorrow as she is out of Fentanyl patches. Patient states she has enough Oxycodone. Patient to follow up with Dr. Yetta Barre in the office tomorrow at 3 pm.   Jill Alexanders, DO PGY-1 Internal Medicine Resident Pager # [phone] 04/08/2015 3:44 PM   Alexa Dulcy Fanny, MD 04/08/15 1544  Cathren Laine, MD 04/09/15 1444

## 2015-04-08 NOTE — ED Notes (Signed)
PT reports she voided on carpet while walking to bath room this AM. Pt states " This new for her."

## 2015-04-08 NOTE — ED Notes (Signed)
Pt assisted to BSC to void.

## 2015-04-08 NOTE — ED Provider Notes (Signed)
The patient was signed out with chronic back pain with acute exacerbation. The plan was for treatment with fentanyl and Dilaudid in observation for patient's ability to ambulate after pain management. I reassessed the patient and found that she was now able to roll to her side without severe pain however with ambulation she again experienced excruciating pain that made her tearful. Patient did not endorse any weakness in association with her symptoms. Neurologic examination is intact. Patient's case was consult with Dr. Lovell SheehanJenkins who is on-call for Dr. Yetta BarreJones, her neurosurgeon. At this point with intractable pain the patient was placed in observation for pain medication and he will notify Dr. Yetta BarreJones for assessing the patient in the hospital tomorrow. No neurologic dysfunction and at this time she does not need emergent interventional therapies. He advises for admission to her primary provider.  Arby BarretteMarcy Anniebell Bedore, MD 04/08/15 2156

## 2015-04-08 NOTE — ED Notes (Signed)
Info entered at 1017 was entered under wrong name.

## 2015-04-08 NOTE — H&P (Signed)
Family Medicine Teaching [facility] Admission History and Physical Service Pager: [phone]  Patient name: Melissa Gonzales Medical record number: [id number] Date of birth: [birth year] Age: 74 y.o. Gender: female  Primary Care Provider: Araceli Bouche, DO Consultants: Neurosurgery Code Status: Full code  Chief Complaint: Back pain  Assessment and Plan: Melissa Gonzales is a 74 y.o. female presenting with low back pain . PMH is significant for Lumbar laminectomy and Extension of lumbar fusion, glaucoma, HLD, anxiety, HTN, GERD, urine incontinence  # Back pain: pain appears localized over SI joint area rather than lumbar spine. Patient with recent lumbar laminectomy and lumbar fusion. Neurosurgery consulted in the ED and Dr. Yetta Barre to see in AM. Pain controlled by the time I saw her. No neurological deficits. X-ray reassuring.  - place in observation, Dr. Lum Babe - fentanyl patch - oxycodone  - dilaudid 0.5mg  q4hrs - Zanaflex  q8hrs  # HTN: Blood pressure controlled - continue lisinopril  - continue hctz 12.5mg   # GERD - continue PPI  # Anxiety - continue home Zoloft and  Xanax  # Glaucoma - continue brimonidine, cosopt and xalatan  # Urinary incontinence - hold oxybutynin  FEN/GI: Carb modified/heart healthy Prophylaxis: heparin  Disposition: place in observation  History of Present Illness: Melissa Gonzales is a 74 y.o. female presenting with lower back pain. Patient reports having lumbar fusion surgery about one month ago. Since then, she has been having achy pain that was well controlled on fentanyl patch and oxycodone. This morning, she states that she had extremely sharp pain in her back with no radiation. Pain was present consistently but worse with movement/rolling over. No associated bladder or bowel incontinence (patient has baseline urinary incontinence and had nothing above baseline). Patient did not have any fentanyl patches left and called EMS  for transport to the ED. Upon arrival, patient was given a fentanyl patch and oxycodone which did not help. Neurosurgery called and recommended x-rays, which were unremarkable for an acute process. Patient received dilaudid and Toradol which helped with pain.   Review Of Systems: Per HPI with the following additions: None Otherwise 12 point review of systems was performed and was unremarkable.  Patient Active Problem List   Diagnosis Date Noted  . S/P lumbar spinal fusion 11/27/2014  . Right shoulder pain 09/02/2014  . Low back pain 09/02/2014  . Axillary pain 07/12/2014  . Wellness examination 05/22/2014  . Insomnia 05/14/2013  . Preventive measure 04/15/2012  . Depressive disorder 01/23/2012  . Urge incontinence 01/23/2012  . ARTHRITIS, HANDS, BILATERAL 03/25/2009  . HYPERCHOLESTEROLEMIA 02/27/2008  . MIGRAINE HEADACHE 02/27/2008  . HYPERTENSION 02/27/2008  . Anxiety state 08/01/2007   Past Medical History: Past Medical History  Diagnosis Date  . Anxiety   . GERD (gastroesophageal reflux disease)   . Hyperlipidemia   . Hypertension   . Depression   . Glaucoma     Bilateral eyes  . Pneumonia     hx of  . Urgency of urination   . Diverticulosis   . Arthritis    Past Surgical History: Past Surgical History  Procedure Laterality Date  . Cholecystectomy  1993  . Vaginal hysterectomy  1970s    uterine prolapse  . Maximum access (mas)posterior lumbar interbody fusion (plif) 2 level N/A 11/27/2014    Procedure: LUMBAR THREE-FOUR, LUMBAR FOUR-FIVE MAXIMUM ACCESS POSTERIOR LUMBAR INTERBODY FUSION;  Surgeon: Tia Alert, MD;  Location: [facility];  Service: Neurosurgery;  Laterality: N/A;  L3-4 L4-5 MAXIMUM ACCESS  POSTERIOR LUMBAR INTERBODY FUSION  . Eye surgery      Cataract Surgery Bilateral, 5 years ago  . Colonoscopy    . Maximum access (mas)posterior lumbar interbody fusion (plif) 1 level N/A 03/17/2015    Procedure: Lumbar five -sacral one Maximum access posterior  lumbar interbody fusion/Lumbar two-three  Laminectomy and extension of instrumentation to Sacral-one;  Surgeon: Tia Alertavid S Jones, MD;  Location: [facility];  Service: Neurosurgery;  Laterality: N/A;   Social History: History  Substance Use Topics  . Smoking status: Never Smoker   . Smokeless tobacco: Not on file  . Alcohol Use: 0.6 oz/week    1 Glasses of wine per week     Comment: 1 glass per night   Additional social history: None  Please also refer to relevant sections of EMR.  Family History: Family History  Problem Relation Age of Onset  . Mental illness Daughter   . Drug abuse Grandchild   . Heart disease Mother 486    died MI age 75  . Hyperlipidemia Sister   . Hypertension Sister   . Diabetes Neg Hx   . Cancer Father     Primary Cell Liver Cancer   Allergies and Medications: No Known Allergies No current facility-administered medications on file prior to encounter.   Current Outpatient Prescriptions on File Prior to Encounter  Medication Sig Dispense Refill  . ALPHAGAN P 0.1 % SOLN Place 1 drop into both eyes every 12 (twelve) hours.     . ALPRAZolam (XANAX) 0.25 MG tablet Take 1 tablet (0.25 mg total) by mouth 2 (two) times daily as needed for anxiety. Do not take if not needed for anxiety. 30 tablet 3  . atorvastatin (LIPITOR) 40 MG tablet take 1 tablet by mouth once daily 90 tablet 3  . butalbital-acetaminophen-caffeine (FIORICET, ESGIC) 50-325-40 MG per tablet TAKE 1 TABLET BY MOUTH TWICE DAILY AS NEEDED FOR HEADACHE 30 tablet 0  . Calcium Carb-Cholecalciferol (CALCIUM 600 + D PO) Take 1 tablet by mouth 2 (two) times daily.    Marland Kitchen. conjugated estrogens (PREMARIN) vaginal cream Place vaginally daily. (Patient not taking: Reported on 11/18/2014) 42.5 g 12  . dorzolamide-timolol (COSOPT) 22.3-6.8 MG/ML ophthalmic solution Place 1 drop into both eyes 2 (two) times daily.     Marland Kitchen. esomeprazole (NEXIUM) 40 MG capsule Take 1 capsule (40 mg total) by mouth daily before breakfast.  90 capsule 3  . fentaNYL (DURAGESIC - DOSED MCG/HR) 50 MCG/HR Place 1 patch (50 mcg total) onto the skin every 3 (three) days. 10 patch 0  . FLUOCINOLONE ACETONIDE SCALP 0.01 % OIL Apply 1 application topically daily as needed.   3  . gabapentin (NEURONTIN) 300 MG capsule 1 capsule po qam; 2 capsules po in afternoon, 2 capsules po qhs (Patient not taking: Reported on 11/18/2014) 150 capsule 1  . glucosamine-chondroitin 500-400 MG tablet Take 1 tablet by mouth 2 (two) times daily.    . hydrochlorothiazide (MICROZIDE) 12.5 MG capsule Take 1 capsule (12.5 mg total) by mouth daily. 90 capsule 3  . Ketoprofen POWD Ketoprofen gel 20% aaa tid (Patient not taking: Reported on 11/18/2014) 60 g 12  . latanoprost (XALATAN) 0.005 % ophthalmic solution Place 1 drop into both eyes at bedtime.     Marland Kitchen. lisinopril (PRINIVIL,ZESTRIL) 20 MG tablet Take 1 tablet (20 mg total) by mouth daily. 90 tablet 3  . loratadine (CLARITIN) 10 MG tablet Take 10 mg by mouth daily as needed for allergies.     . Multiple Vitamins-Minerals (  MULTIVITAMIN WITH MINERALS) tablet Take 1 tablet by mouth daily.    . Omega-3 Fatty Acids (FISH OIL) 1000 MG CAPS Take 1 capsule by mouth 2 (two) times daily.    Marland Kitchen oxybutynin (DITROPAN-XL) 10 MG 24 hr tablet Take 1 tablet (10 mg total) by mouth daily. 30 tablet 6  . Oxycodone HCl 10 MG TABS Take 1 tablet (10 mg total) by mouth every 4 (four) hours as needed (Pt prefers Twice daily as needed). 60 tablet 0  . sertraline (ZOLOFT) 100 MG tablet Take 1 tablet (100 mg total) by mouth daily. 90 tablet 3  . tiZANidine (ZANAFLEX) 4 MG tablet Take 1 tablet (4 mg total) by mouth every 8 (eight) hours as needed. 60 tablet 1  . triamcinolone (KENALOG) 0.025 % cream Apply 1 application topically 2 (two) times daily as needed.      Objective: BP 151/72 mmHg  Pulse 77  Temp(Src) 98.1 F (36.7 C) (Oral)  Resp 22  Ht 4' 1.5" (1.257 m)  Wt 147 lb (66.679 kg)  BMI 42.20 kg/m2  SpO2 99% Exam: General: Well  appearing, no distress, laying down with daughter at bedside Eyes: PERRL ENTM: Oropharynx clear with slightly dry mucous membranes, no lesions Neck: No adenopathy, no thyromegaly Cardiovascular: Regular rate and rhythm, no mumur Respiratory: Mild diffuse crackles, good air movement Abdomen: Soft, non-tender, non-distended, good bowel sounds MSK: No edema. Tenderness over right hip around SIJ. FABER negative bilaterally but patient did have right posterior hip pain with FADIR of right leg. Leg raises without pain. Strength of leg limited by pain bilaterally. 5/5 dorsi/plantarflexion bilaterally Skin: No rashes, longitudinal surgical scar located at lumbar region without erythema or tenderness Neuro: CN grossly intact, alert, oriented, sensation in bilateral LE intact Psych: Normal affect  Labs and Imaging: CBC BMET  No results for input(s): WBC, HGB, HCT, PLT in the last 168 hours. No results for input(s): NA, K, CL, CO2, BUN, CREATININE, GLUCOSE, CALCIUM in the last 168 hours.   Dg Lumbar Spine 2-3 Views  04/08/2015   CLINICAL DATA:  New onset right hip pain post lumbar fusion 3 weeks ago.  EXAM: LUMBAR SPINE - 2-3 VIEW  COMPARISON:  Intraoperative radiographs of the lumbar spine -03/17/2015; lumbar spine CT -02/08/2015 lumbar spine radiographs - 01/11/2015  FINDINGS: There are 5 non rib-bearing lumbar type vertebral bodies.  There is unchanged caudal extension of previously noted paraspinal fusion hardware, now extending from L3-S1.  Interval removal of previously noted right-sided L5 pedicular screw. Bilateral pedicular screws are seen at the L3, L4 and S1 vertebral body levels. Post suspected L3 laminectomy. No definite evidence of hardware failure or loosening. Stable sequela of L3-L4 and L4-L5 intervertebral disc space replacement.  Remaining vertebral body heights appear preserved.  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  Limited visualization the bilateral SI joints is normal.  Moderate colonic stool burden without evidence of enteric obstruction. A prominent phlebolith overlies the left hemipelvis. Post cholecystectomy.  IMPRESSION: 1. Post L3-S1 paraspinal fusion including removal of the right sided L5 pedicular screw. No evidence of hardware failure or loosening. 2. 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   Electronically Signed   By: Simonne Come M.D.   On: 04/08/2015 13:28    Narda Bonds, MD 04/08/2015, 9:52 PM PGY-2, [facility] FPTS Intern pager: [phone], text pages welcome

## 2015-04-08 NOTE — Discharge Instructions (Signed)
·   Thank you for allowing us to be involved in your healthcare while you were at [facility].   Please note that there have not been changes to your home medications.   Please call your PCP if you have any questions or concerns, or any difficulty getting any of your medications.  Please return to the ER if you have worsening of your symptoms or new severe symptoms arise.  Please follow up with Dr. Yetta BarreJones during your appointment tomorrow at 3 pm. We have provided a Fentanyl patch for you. Continue to take your oxycodone 10 mg Q4H prn pain.   Spinal Fusion Care After Refer to this sheet in the next few weeks. These instructions provide you with information on caring for yourself after your procedure. Your caregiver may also give you more specific instructions. Your treatment has been planned according to current medical practices, but problems sometimes occur. Call your caregiver if you have any problems or questions after your procedure. HOME CARE INSTRUCTIONS   Take whatever pain medicine has been prescribed by your caregiver. Do not take over-the-counter pain medicine unless directed otherwise by your caregiver.  Do not drive if you are taking narcotic pain medicines.  Change your bandage (dressing) if necessary or as directed by your caregiver.  Do not get your surgical cut (incision) wet. After a few days you may take quick showers (rather than baths), but keep your incision clean and dry. Covering the incision with plastic wrap while you shower should keep your incision dry. A few weeks after surgery, once your incision has healed and your caregiver says it is okay, you can take baths or go swimming.  If you have been prescribed medicine to prevent your blood from clotting, follow the directions carefully.  Check the area around your incision often. Look for redness and swelling. Also, look for anything leaking from your wound. You can use a mirror or have a family member  inspect your incision if it is in a place where it is difficult for you to see.  Ask your caregiver what activities you should avoid and for how long.  Walk as much as possible.  Do not lift anything heavier than 10 pounds (4.5 kilograms) until your caregiver says it is safe.  Do not twist or bend for a few weeks. Try not to pull on things. Avoid sitting for long periods of time. Change positions at least every hour.  Ask your caregiver what kinds of exercise you should do to make your back stronger and when you should begin doing these exercises. SEEK IMMEDIATE MEDICAL CARE IF:   Pain suddenly becomes much worse.  The incision area is red, swollen, bleeding, or leaking fluid.  Your legs or feet become increasingly painful, numb, weak, or swollen.  You have trouble controlling urination or bowel movements.  You have trouble breathing.  You have chest pain.  You have a fever. MAKE SURE YOU:  Understand these instructions.  Will watch your condition.  Will get help right away if you are not doing well or get worse. Document Released: 05/12/2005 Document Revised: 01/15/2012 Document Reviewed: 01/05/2011 [facility] Patient Information 2015 [employer] [place]. This information is not intended to replace advice given to you by your health care provider. Make sure you discuss any questions you have with your health care provider.

## 2015-04-09 ENCOUNTER — Encounter (HOSPITAL_COMMUNITY): Payer: Self-pay | Admitting: *Deleted

## 2015-04-09 DIAGNOSIS — Y838 Other surgical procedures as the cause of abnormal reaction of the patient, or of later complication, without mention of misadventure at the time of the procedure: Secondary | ICD-10-CM | POA: Diagnosis present

## 2015-04-09 DIAGNOSIS — R339 Retention of urine, unspecified: Secondary | ICD-10-CM | POA: Diagnosis not present

## 2015-04-09 DIAGNOSIS — E785 Hyperlipidemia, unspecified: Secondary | ICD-10-CM | POA: Diagnosis present

## 2015-04-09 DIAGNOSIS — M47896 Other spondylosis, lumbar region: Secondary | ICD-10-CM | POA: Diagnosis present

## 2015-04-09 DIAGNOSIS — G4700 Insomnia, unspecified: Secondary | ICD-10-CM | POA: Diagnosis present

## 2015-04-09 DIAGNOSIS — M9669 Fracture of other bone following insertion of orthopedic implant, joint prosthesis, or bone plate: Secondary | ICD-10-CM | POA: Diagnosis present

## 2015-04-09 DIAGNOSIS — M545 Low back pain: Secondary | ICD-10-CM | POA: Diagnosis present

## 2015-04-09 DIAGNOSIS — Z8249 Family history of ischemic heart disease and other diseases of the circulatory system: Secondary | ICD-10-CM | POA: Diagnosis not present

## 2015-04-09 DIAGNOSIS — N3941 Urge incontinence: Secondary | ICD-10-CM | POA: Diagnosis present

## 2015-04-09 DIAGNOSIS — K219 Gastro-esophageal reflux disease without esophagitis: Secondary | ICD-10-CM | POA: Diagnosis present

## 2015-04-09 DIAGNOSIS — G43909 Migraine, unspecified, not intractable, without status migrainosus: Secondary | ICD-10-CM | POA: Diagnosis present

## 2015-04-09 DIAGNOSIS — T84226A Displacement of internal fixation device of vertebrae, initial encounter: Secondary | ICD-10-CM | POA: Diagnosis present

## 2015-04-09 DIAGNOSIS — Z8701 Personal history of pneumonia (recurrent): Secondary | ICD-10-CM | POA: Diagnosis not present

## 2015-04-09 DIAGNOSIS — H409 Unspecified glaucoma: Secondary | ICD-10-CM | POA: Diagnosis present

## 2015-04-09 DIAGNOSIS — M5441 Lumbago with sciatica, right side: Secondary | ICD-10-CM | POA: Diagnosis not present

## 2015-04-09 DIAGNOSIS — M8448XA Pathological fracture, other site, initial encounter for fracture: Secondary | ICD-10-CM | POA: Diagnosis present

## 2015-04-09 DIAGNOSIS — Z90710 Acquired absence of both cervix and uterus: Secondary | ICD-10-CM | POA: Diagnosis not present

## 2015-04-09 DIAGNOSIS — M1990 Unspecified osteoarthritis, unspecified site: Secondary | ICD-10-CM | POA: Diagnosis present

## 2015-04-09 DIAGNOSIS — F411 Generalized anxiety disorder: Secondary | ICD-10-CM | POA: Diagnosis present

## 2015-04-09 DIAGNOSIS — I10 Essential (primary) hypertension: Secondary | ICD-10-CM | POA: Diagnosis present

## 2015-04-09 DIAGNOSIS — F329 Major depressive disorder, single episode, unspecified: Secondary | ICD-10-CM | POA: Diagnosis present

## 2015-04-09 DIAGNOSIS — Z79899 Other long term (current) drug therapy: Secondary | ICD-10-CM | POA: Diagnosis not present

## 2015-04-09 DIAGNOSIS — M549 Dorsalgia, unspecified: Secondary | ICD-10-CM | POA: Diagnosis present

## 2015-04-09 DIAGNOSIS — Z9049 Acquired absence of other specified parts of digestive tract: Secondary | ICD-10-CM | POA: Diagnosis present

## 2015-04-09 MED ORDER — CELECOXIB 200 MG PO CAPS
200.0000 mg | ORAL_CAPSULE | Freq: Two times a day (BID) | ORAL | Status: AC
  Administered 2015-04-09 – 2015-04-13 (×10): 200 mg via ORAL
  Filled 2015-04-09 (×11): qty 1

## 2015-04-09 MED ORDER — DEXAMETHASONE SODIUM PHOSPHATE 4 MG/ML IJ SOLN
4.0000 mg | Freq: Four times a day (QID) | INTRAMUSCULAR | Status: AC
  Administered 2015-04-09 – 2015-04-10 (×4): 4 mg via INTRAVENOUS
  Filled 2015-04-09 (×6): qty 1

## 2015-04-09 MED ORDER — MUPIROCIN 2 % EX OINT
1.0000 "application " | TOPICAL_OINTMENT | Freq: Two times a day (BID) | CUTANEOUS | Status: AC
  Administered 2015-04-09 – 2015-04-13 (×10): 1 via NASAL
  Filled 2015-04-09 (×2): qty 22

## 2015-04-09 MED ORDER — CHLORHEXIDINE GLUCONATE CLOTH 2 % EX PADS
6.0000 | MEDICATED_PAD | Freq: Every day | CUTANEOUS | Status: AC
  Administered 2015-04-10 – 2015-04-13 (×3): 6 via TOPICAL

## 2015-04-09 NOTE — Progress Notes (Signed)
Patient was admitted last night with right sided low back pain over the SI joint. She doesn't complain of leg pain. There is no pain in the musculature of the back. It is fairly focal right over the SI joint and found her to be quite tender. Her incision looks good without evidence of infection. She has good strength in her legs. She has no pain at rest but hurts whenever she moves. Her plain films were reviewed and look fine. I see nothing acute or worrisome based on the limits of plain films. I do not believe MRI or CT is absolutely necessary at this point. I would like to try to get her pain under control. She potentially could have an acute right sacroiliitis or simply an exacerbation of her postoperative back pain. I do not see evidence of sacral insufficiency fracture or hardware loosening or other change on the plain films of the lumbar spine. I do not see any evidence to suggest she has an infection.   I will transfer her to my service and transfer to 4 N. I will add Decadron and NSAIDs. She doesn't get better over the next few days then we will perform an MRI and/or CT scan of the lumbar spine.

## 2015-04-09 NOTE — Progress Notes (Signed)
Pt arrived to 4N03 at current time.  Pt A&O x 4, c/o 6/10 lower back pain.   Pt V/S taken, DTV,  without distress.  Diet ordered, will monitor.

## 2015-04-09 NOTE — Progress Notes (Signed)
Family Medicine Teaching Service Daily Progress Note Intern Pager: [phone]  Patient name: Melissa LivingsCarol A Machi Medical record number: [id number] Date of birth: 07-06-1941 Age: 74 y.o. Gender: female  Primary Care Provider: Garry Heateraleigh Rumley, DO Consultants: neurosurgery Code Status: full code  Pt Overview and Major Events to Date:  6/2 - admit for back pain  Assessment and Plan: Melissa Gonzales is a 74 y.o. female presenting with low back pain . PMH is significant for Lumbar laminectomy and Extension of lumbar fusion, glaucoma, HLD, anxiety, HTN, GERD, urine incontinence  # Back pain: pain appears localized over SI joint area rather than lumbar spine. Patient with recent lumbar laminectomy and lumbar fusion.  No neurological deficits. X-ray reassuring.  - transfer to neurosurgery service - pain control per neurosurgery  #Urinary retention: RN reported discomfort when trying and unable to urinate this AM.  Bladder scan showed 350mL. S/p I&O cath.  Could be related to dilaudid use. - hold home oxybutinin - plan per neurosurgery  # HTN: Blood pressure controlled - continue home lisinopril 20mg  and hctz 12.5mg   # GERD: continue PPI  # Anxiety: continue home Zoloft and Xanax  # Glaucoma: continue brimonidine, cosopt and xalatan  FEN/GI: Carb modified/heart healthy Prophylaxis: heparin  Disposition: Transfer to neurosurgery service. Signing off, plan per primary team.  Subjective:  Patient reports ongoing discomfort with any movement.  Objective: Temp:  [98.3 F (36.8 C)-98.9 F (37.2 C)] 98.9 F (37.2 C) (06/03 0535) Pulse Rate:  [63-77] 63 (06/03 0535) Resp:  [17-22] 18 (06/03 0535) BP: (112-151)/(49-83) 137/63 mmHg (06/03 0535) SpO2:  [91 %-99 %] 96 % (06/03 0535) Physical Exam: General: Well appearing, NAD, laying in bed Cardiovascular: RRR, no m/r/g, 2+ DP pulses Respiratory: CTAB, no w/r/c Abdomen: Soft, NTND, +BS Extremities: No edema MSK: Tenderness over right hip  around SIJ. Atrength intact, straight leg raise without pain Skin: No reashes, longitudinal surgical scar over lumbar spine, no erythema or tenderness Neuro: CN grossly intact, alert, oriented, sensation intact   Erasmo DownerAngela M Abrian Hanover, MD 04/09/2015, 12:10 PM PGY-1, [facility] FPTS Intern pager: [phone], text pages welcome

## 2015-04-10 ENCOUNTER — Inpatient Hospital Stay (HOSPITAL_COMMUNITY): Payer: Medicare Other

## 2015-04-10 NOTE — Progress Notes (Signed)
Patient ID: Melissa Gonzales, female   DOB: [birth year], 74 y.o.   MRN: [id number] Afeb, vss No new neuro issues She is very frustrated with her situation. I feel she would feel better if her work up started, so I am going to get a CT of the lumbar spine to start her assessment.

## 2015-04-10 NOTE — Progress Notes (Signed)
Pt requested RN to call MD about reordering decadron, which was discontinued. She claims that this is the only thing that helps her pain. RN paged Dr. Conchita ParisNundkumar. He states that he does not think adding decadron will be appropriate tx. Pt educated. Will continue to monitor.

## 2015-04-11 MED ORDER — ALPRAZOLAM 0.25 MG PO TABS
0.2500 mg | ORAL_TABLET | Freq: Three times a day (TID) | ORAL | Status: DC | PRN
  Administered 2015-04-11 – 2015-04-16 (×7): 0.25 mg via ORAL
  Filled 2015-04-11 (×7): qty 1

## 2015-04-11 NOTE — Progress Notes (Signed)
No issues overnight. Pt does cont to have back and leg pain but seems comfortable with current pain medications.  EXAM:  BP 124/51 mmHg  Pulse 63  Temp(Src) 98.4 F (36.9 C) (Oral)  Resp 18  Ht 4' 1.5" (1.257 m)  Wt 66.679 kg (147 lb)  BMI 42.20 kg/m2  SpO2 98%  Awake, alert, oriented  Speech fluent, appropriate  CN grossly intact  Moving legs well  IMPRESSION:  74 y.o. female with loosening of L5 screw and sacral fracture after extension of fusion.  PLAN: - Cont current pain medications. - I have elected not to restart steroids - PRN xanax TID

## 2015-04-12 MED ORDER — ENOXAPARIN SODIUM 40 MG/0.4ML ~~LOC~~ SOLN
40.0000 mg | SUBCUTANEOUS | Status: DC
  Administered 2015-04-12 – 2015-04-16 (×5): 40 mg via SUBCUTANEOUS
  Filled 2015-04-12 (×5): qty 0.4

## 2015-04-12 NOTE — Progress Notes (Signed)
I had a long discussion with the patient today regarding her CT scan findings. She now has a sacral insufficiency fracture likely from shear forces from the pedicle screws which had bicortical purchase. I think her options now include prolonged bed rest with bracing for mobilization with no further surgery, further surgery to simply remove all hardware and utilized bracing postoperatively, further surgery to extend the hardware down to include iliac bolts (these are basically cancellous screws that take some of the pressure off of the sacral screws, but without solid sacral screws are not sure these all for her significantly more biomechanical stability after a few short weeks in which they will likely also show lucency around the screws, however will focus a few short weeks allow for more bony healing to then allow hardware removal?), Versus some type of anterior procedure such as a left with an anterior plate (I'm not sure this will be biomechanically sound given her sacral fracture). She would like an opinion from one of the orthopedic surgeons and I'll ask one of them to see her. I spent considerable time with her this morning trying to explain all these options and explained her previous surgeries in the decision making behind those surgeries and her future potential surgery. She has some difficulty understanding that this is certainly reasonable given the complexity of this decision making. I will also call today and speak with her daughter.

## 2015-04-13 ENCOUNTER — Telehealth: Payer: Self-pay | Admitting: Family Medicine

## 2015-04-13 LAB — CBC
HCT: 42 % (ref 36.0–46.0)
Hemoglobin: 13.6 g/dL (ref 12.0–15.0)
MCH: 29.8 pg (ref 26.0–34.0)
MCHC: 32.4 g/dL (ref 30.0–36.0)
MCV: 91.9 fL (ref 78.0–100.0)
Platelets: 258 10*3/uL (ref 150–400)
RBC: 4.57 MIL/uL (ref 3.87–5.11)
RDW: 13 % (ref 11.5–15.5)
WBC: 5.8 10*3/uL (ref 4.0–10.5)

## 2015-04-13 LAB — BASIC METABOLIC PANEL
Anion gap: 8 (ref 5–15)
BUN: 14 mg/dL (ref 6–20)
CO2: 29 mmol/L (ref 22–32)
Calcium: 9.1 mg/dL (ref 8.9–10.3)
Chloride: 101 mmol/L (ref 101–111)
Creatinine, Ser: 0.62 mg/dL (ref 0.44–1.00)
GFR calc Af Amer: >60 mL/min (ref >60)
Glucose, Bld: 103 mg/dL — ABNORMAL HIGH (ref 65–99)
Potassium: 3.7 mmol/L (ref 3.5–5.1)
SODIUM: 138 mmol/L (ref 135–145)

## 2015-04-13 NOTE — Consult Note (Signed)
Full note dictated - job no. [id number] Briefly:  Patient with sacral fracture s/p L3-S1 PSFI with interbody fixation L3-5. Currently neurologically intact - no evidence of sacral nerve dysfunction. Would recommend conservative management of the sacral fracture for minimum of 3 months.  This would involve TLSO with thigh cuff. Would also supplement with external bone stimulator to improve fusion rate.   Would recommend endocrinology evaluation for metabolic bone disease work-up May require revision surgery in the future but unless the fracture displaces and causes neurologic deficits I would recommend waiting until it heals. Typically 3-4 months.

## 2015-04-13 NOTE — Progress Notes (Signed)
She looks quite comfortable, and she feels she may be getting somewhat better. No leg pain or NTW. Back pain only with mvmt.   I have arranged, at her request, for Dr Shon BatonBrooks of ortho and Dr Venetia MaxonStern of Neuro to come by and see her and discuss her situation. I appreciate their input and efforts.  I feel the best of course of action at this point is no further surgery, continued bracing, and relative immobility to allow this to heal without further intervention (hopefully).

## 2015-04-13 NOTE — Telephone Encounter (Signed)
[facility]Sw[facility] and has been admitted for "back surgery gone wrong", she would like to ask if Dr. Caroleen Hammanumley could come visit her as she has some concerns and general questions about her situation. She states that she is "so confused about what has happened" to her. She says that she is located at [address] Neuro Room 3. Thank you, Dorothey BasemanSadie Reynolds, ASA

## 2015-04-14 MED ORDER — CALCIUM CARBONATE-VITAMIN D 500-200 MG-UNIT PO TABS
1.0000 | ORAL_TABLET | Freq: Every day | ORAL | Status: DC
  Administered 2015-04-14 – 2015-04-16 (×3): 1 via ORAL
  Filled 2015-04-14 (×3): qty 1

## 2015-04-14 NOTE — Consult Note (Signed)
Reason for Consult:Sacral fracture and pain Referring Physician: Caitlin Ainley is an 74 y.o. female.  HPI: Patient's history was clearly articulated in Dr. Rolena Infante' note.  Patient underwent L 3-5 fusion in January 2016, revision surgery on Mar 17, 2015 with removal of a fractured right L 5 screw and extension of fusion to L 23 and L 5 S 1 levels.  Patient was readmitted with severe pain on April 08, 2015 and was found to have a coronally oriented right sacral alar fracture which extended into the right S 1 pedicle.  The patient has had significant persistent pain into the right buttock without radicular complaints.  Past Medical History  Diagnosis Date  . Anxiety   . GERD (gastroesophageal reflux disease)   . Hyperlipidemia   . Hypertension   . Depression   . Glaucoma     Bilateral eyes  . Pneumonia     hx of  . Urgency of urination   . Diverticulosis   . Arthritis     Past Surgical History  Procedure Laterality Date  . Cholecystectomy  1993  . Vaginal hysterectomy  1970s    uterine prolapse  . Maximum access (mas)posterior lumbar interbody fusion (plif) 2 level N/A 11/27/2014    Procedure: LUMBAR THREE-FOUR, LUMBAR FOUR-FIVE MAXIMUM ACCESS POSTERIOR LUMBAR INTERBODY FUSION;  Surgeon: Eustace Moore, MD;  Location: [facility];  Service: Neurosurgery;  Laterality: N/A;  L3-4 L4-5 MAXIMUM ACCESS POSTERIOR LUMBAR INTERBODY FUSION  . Eye surgery      Cataract Surgery Bilateral, 5 years ago  . Colonoscopy    . Maximum access (mas)posterior lumbar interbody fusion (plif) 1 level N/A 03/17/2015    Procedure: Lumbar five -sacral one Maximum access posterior lumbar interbody fusion/Lumbar two-three  Laminectomy and extension of instrumentation to [place];  Surgeon: Eustace Moore, MD;  Location: [facility];  Service: Neurosurgery;  Laterality: N/A;    Family History  Problem Relation Age of Onset  . Mental illness Daughter   . Drug abuse Grandchild   . Heart disease Mother 15     died MI age 71  . Hyperlipidemia Sister   . Hypertension Sister   . Diabetes Neg Hx   . Cancer Father     Primary Cell Liver Cancer    Social History:  reports that she has never smoked. She does not have any smokeless tobacco history on file. She reports that she drinks about 0.6 oz of alcohol per week. She reports that she does not use illicit drugs.  Allergies: No Known Allergies  Medications: I have reviewed the patient's current medications.  Results for orders placed or performed during the hospital encounter of 04/08/15 (from the past 48 hour(s))  Basic metabolic panel     Status: Abnormal   Collection Time: 04/13/15  7:43 AM  Result Value Ref Range   Sodium 138 135 - 145 mmol/L   Potassium 3.7 3.5 - 5.1 mmol/L   Chloride 101 101 - 111 mmol/L   CO2 29 22 - 32 mmol/L   Glucose, Bld 103 (H) 65 - 99 mg/dL   BUN 14 6 - 20 mg/dL   Creatinine, Ser 0.62 0.44 - 1.00 mg/dL   Calcium 9.1 8.9 - 10.3 mg/dL   GFR calc non Af Amer >60 >60 mL/min   GFR calc Af Amer >60 >60 mL/min    Comment: (NOTE) The eGFR has been calculated using the CKD EPI equation. This calculation has not been validated in all clinical situations.  eGFR's persistently <60 mL/min signify possible Chronic Kidney Disease.    Anion gap 8 5 - 15  CBC     Status: None   Collection Time: 04/13/15  7:43 AM  Result Value Ref Range   WBC 5.8 4.0 - 10.5 K/uL   RBC 4.57 3.87 - 5.11 MIL/uL   Hemoglobin 13.6 12.0 - 15.0 g/dL   HCT 42.0 36.0 - 46.0 %   MCV 91.9 78.0 - 100.0 fL   MCH 29.8 26.0 - 34.0 pg   MCHC 32.4 30.0 - 36.0 g/dL   RDW 13.0 11.5 - 15.5 %   Platelets 258 150 - 400 K/uL    No results found.  Review of Systems - Negative except as above    Blood pressure 118/62, pulse 60, temperature 98.1 F (36.7 C), temperature source Oral, resp. rate 20, height 4' 1.5" (1.257 m), weight 66.679 kg (147 lb), SpO2 98 %. Physical Exam  Constitutional: She is oriented to person, place, and time. She appears  well-developed and well-nourished.  HENT:  Head: Normocephalic and atraumatic.  Eyes: Pupils are equal, round, and reactive to light.  Neck: Normal range of motion.  Neurological: She is alert and oriented to person, place, and time. She has normal reflexes.  Skin: Skin is warm and dry.  Psychiatric: She has a normal mood and affect. Her behavior is normal. Judgment and thought content normal.    Assessment/Plan: Patient has osteoporotic fractures involving L 5 pedicle and now right sacral ala.  I agree with Dr. Ronnald Ramp and Rolena Infante that metabolic evaluation may prove useful for addressing underlying osteoporosis.  I would not recommend additional surgery.  It is likely that the patient will continue to heal with time and brace immobilization.  I agree with Dr. Rolena Infante that leg extension on TLSO may be helpful.  Patient understands that healing will likely be slow.  She is already noticing that she is requiring less frequent pain medication.  I am happy to assist if there are any additional concerns or questions.  Peggyann Shoals, MD 04/14/2015, 5:41 PM

## 2015-04-14 NOTE — Progress Notes (Signed)
She looks good this morning. She looks very comfortable sitting in bed. He states she is ready to go home any time. However I have ordered a new brace for her and there is going to be some time to get that brace fabricated and then teach her to use it. She still has no leg pain or numbness tingling or weakness or bowel or bladder changes. Her neurologic exam is normal.  I appreciate Dr. Gary FleetBrooks's input and I totally agree with his assessment and plan.  We will await Dr. Fredrich BirksStern's input.  I spent considerable time with her once again today explaining the decision making and the 2 surgeries and her current situation..Marland Kitchen

## 2015-04-14 NOTE — Consult Note (Signed)
NAMMolli Knock:  Nolting, Zakaria               ACCOUNT NO.:  [id number]  MEDICAL RECORD NO.:  [id number]  LOCATION:  4N03C                        FACILITY:  [facility]  PHYSICIAN:  Christopherjame Carnell D. Shon BatonBrooks, M.D. DATE OF BIRTH:  February 05, 1941  DATE OF CONSULTATION: DATE OF DISCHARGE:                                CONSULTATION   REASON FOR CONSULTATION:  Second opinion concerning management of sacral fracture.  HISTORY:  This is a very pleasant 74 year old woman who had a posterior lumbar interbody fusion at L3-4 and L4-5 in January of 2016.  The patient states that she continued to have pain and discomfort during the recovery phase.  She noted buckling of the leg, but it was until February that she fell.  She continued to have increasing pain and ultimately in April of 2015, she had a lumbar myelogram.  The myelogram demonstrated a new pedicle screw interbody fixation at L3-4 and L4-5, grade 1 anterior slip similar to her MRI at L4-5.  L5-S1, had developed a new anterior slip.  There was no fusion at L5-S1, advanced degenerative disk disease with spurring at L1-2 and L2-3.  There was moderate spinal stenosis at L2-3, mild at L5-S1.  Pedicle screws were properly positioned.  There was a right L5 pedicle fracture, which caused some medial displacement of the pedicle screw.  As a result of this, the patient was admitted on Mar 17, 2015, for ongoing management.  The patient was taken back to the operating room on Mar 17, 2015, where she had a posterior decompression at L2-3 and L5-S1, removal of the right L5 pedicle screws and replacement of the left L4 pedicle screw and new pedicle screw fixation at L5 and now a L3-S1 posterior instrumented fusion and posterolateral fusion.  Postoperatively, the patient continued to have back, buttock and radicular left leg pain.  She was ultimately discharged on Mar 21, 2015. At that point in time, she was slightly improved.  On April 08, 2015, she returned to the ER  because of ongoing pain.  The patient was complaining of posterior right buttock pain.  She was evaluated and a CT scan with plain x-rays.  The x-rays demonstrated a fracture of the posterior sacral ala.  Fracture line extended in the coronal plane at the level of the S1 endplate.  There was no gross hardware failure.  As a result of the admission, a CT scan was ultimately ordered.  The CT scan was done on April 10, 2015.  That CT scan showed loosening of the fracture of the sacrum, fracture line extends from the S1 pedicle on the right laterally and also extends in the coronal plane to the superior aspect of the S1 vertebral body with about a 2-mm gap and posterior displacement.  There was no gross migration of the S1 screw, although there is an obvious fracture and evidence of loosening.  There is some loosening of the left L5 pedicle screw, but no posterior migration of it.  As a result of the complicated history, Dr. Yetta BarreJones asked me to evaluate the patient.  Past medical, surgical, family, social history are outlined on her intake H and P, I have reviewed that.  Please refer to that  for specifics.  CLINICAL EXAM:  She is a pleasant woman, appears younger than her stated age.  She is alert.  She is oriented x3.  She has 5/5 strength in the lower extremity.  She denies any sensory deficits, but she does have dysesthetic pain especially in the left lower extremity.  No foot drop. Compartments are soft and nontender.  Symmetrical 1+ deep tendon reflexes.  She denies any incontinence of bowel and bladder.  She has significant back pain.  The incision sites are healing well.  There is no evidence of infection.  She has no shortness of breath or chest pain.  At this point in time, I have had a long discussion with the patient and two family friends that are in the room with her.  I have indicated that she may have an underlying metabolic bone disorder and to further elucidate this.  I  would recommend an Endocrinology workup to further evaluate this.  In addition with respect to the sacral fracture, I do not think further surgery at this time would be advisable.  Because of the coronal fracture plane through the posterior vertebral body, I do not think transsacral screws would be of benefit as the screws would be parallel to the fracture line, it would not help reduce this.  My recommendation would be to place her in a TLSO with a thigh cuff, so we can stabilize the L5-S1 level and give her the best chance of healing.  To do this, she would need to get a solid fusion.  However, since there was no interbody fixation, I did tell her that even with 3 months in the TLSO with a thigh cuff, she may not go on to fuse and she may eventually need an anterior interbody fixation surgery to improve her fusion rate and stabilize that L5-S1 level.  However, at this time, given the fracture that she has, I would not recommend any anterior based surgery.  She would need to heal that S1 sacral fracture before surgery could be contemplated.  I do think with a TLSO with the thigh cuff, she can be ambulated and as long as she remained neurologically intact and developed no sacral nerve root dysfunction (incontinence of bowel and bladder) that we could hopefully treat her successfully nonoperatively.  She may need pain medical management to assist in managing her pain while she heals.  I have explained this to the patient and her family and her friends. All of their questions were encouraged and addressed.  If there is anything further I can provide, please do not hesitate to contact me.     Donalyn Schneeberger D. Shon Baton, M.D.     DDB/MEDQ  D:  04/13/2015  T:  04/14/2015  Job:  [id number]  cc:   Tia Alert, MD

## 2015-04-14 NOTE — Progress Notes (Signed)
Orthopedic Tech Progress Note Patient Details:  Melissa Gonzales July 18, 1941 [id number]  Patient ID: Melissa Gonzales, female   DOB: July 18, 1941, 74 y.o.   MRN: [id number] Called in bio-tech brace order; spoke with Kandyce Rudlivia  Melissa Gonzales 04/14/2015, 11:24 AM

## 2015-04-14 NOTE — Telephone Encounter (Signed)
Went to visit Melissa Gonzales on 04/13/15. She is in good spirits after discussion with Orthopedics, who she reports was very good at explaining options and time line of expected course. Somewhat anxious about what she faces over the next few months, but very hopeful after talking with Orthopedics. Family and friends appeared very supportive. Reports pain is improving. [facility] and Orthopedics most recently.   Dr. Caroleen Hammanumley, DO Family Medicine, PGY-1

## 2015-04-15 NOTE — Progress Notes (Signed)
Resting comfortably today. Pain continues to improve. No radicular pain. Neurologic exam normal. Awaiting brace with thigh extension.

## 2015-04-16 MED ORDER — OXYCODONE HCL 10 MG PO TABS: 10.0000 mg | ORAL_TABLET | Freq: Four times a day (QID) | ORAL | Status: DC | PRN

## 2015-04-16 MED ORDER — TIZANIDINE HCL 4 MG PO TABS: 4.0000 mg | ORAL_TABLET | Freq: Three times a day (TID) | ORAL | Status: DC | PRN

## 2015-04-16 MED ORDER — FENTANYL 50 MCG/HR TD PT72: 50.0000 ug | MEDICATED_PATCH | TRANSDERMAL | Status: DC

## 2015-04-16 NOTE — Progress Notes (Signed)
D/C orders received, pt for D/C home today with Home Health.  IV and telemetry D/C.  Rx and D/C instructions given with verbalized understanding.  Family at bedside to assist with D/C.  Staff brought pt downstairs via wheelchair.  

## 2015-04-16 NOTE — Discharge Summary (Signed)
Physician Discharge Summary  Patient ID: Melissa Gonzales MRN: [id number] DOB/AGE: [birth year] 74 y.o.  Admit date: 04/08/2015 Discharge date: 04/16/2015  Admission Diagnoses: back pain    Discharge Diagnoses: sacral fracture   Discharged Condition: stable  Hospital Course: The patient was admitted on 04/08/2015 with patient of right sided back pain 2 weeks after L3-S1 fusion. She had fallen and suffered an L5 pedicle fracture and had revision of that surgery with extension down to S1 just 2 weeks prior. She improved somewhat with bed rest but continued pain to a CT scan of the lumbar spine which showed a sacral insufficiency fracture around the pedicle screws. Pt had appropriate back soreness. No complaints of leg pain or new N/T/W. she had difficulty understanding her situation and asked to be seen by other spine surgeons to try to answer her questions. She was seen by Dr. Shon Baton and Dr. Venetia Maxon who spent considerable time with her and try to answer questions that the best of their ability. We decided that no further surgical intervention was in her best interest. We ordered a TLSO brace with a thigh cuff. A metabolic bone workup will be performed as an outpatient with Dr. Waynard Edwards. The patient remained afebrile with stable vital signs, and tolerated a regular diet. The patient continued to increase activities once her new brace arrived, and pain was well controlled with oral pain medications.   Consults: orthopedic surgery  Significant Diagnostic Studies:  Results for orders placed or performed during the hospital encounter of 04/08/15  Basic metabolic panel  Result Value Ref Range   Sodium 138 135 - 145 mmol/L   Potassium 3.7 3.5 - 5.1 mmol/L   Chloride 101 101 - 111 mmol/L   CO2 29 22 - 32 mmol/L   Glucose, Bld 103 (H) 65 - 99 mg/dL   BUN 14 6 - 20 mg/dL   Creatinine, Ser 4.09 0.44 - 1.00 mg/dL   Calcium 9.1 8.9 - 81.1 mg/dL   GFR calc non Af Amer >60 >60 mL/min   GFR calc Af Amer >60 >60  mL/min   Anion gap 8 5 - 15  CBC  Result Value Ref Range   WBC 5.8 4.0 - 10.5 K/uL   RBC 4.57 3.87 - 5.11 MIL/uL   Hemoglobin 13.6 12.0 - 15.0 g/dL   HCT 91.4 78.2 - 95.6 %   MCV 91.9 78.0 - 100.0 fL   MCH 29.8 26.0 - 34.0 pg   MCHC 32.4 30.0 - 36.0 g/dL   RDW 21.3 08.6 - 57.8 %   Platelets 258 150 - 400 K/uL    Dg Lumbar Spine 2-3 Views  04/08/2015   CLINICAL DATA:  New onset right hip pain post lumbar fusion 3 weeks ago.  EXAM: LUMBAR SPINE - 2-3 VIEW  COMPARISON:  Intraoperative radiographs of the lumbar spine -03/17/2015; lumbar spine CT -02/08/2015 lumbar spine radiographs - 01/11/2015  FINDINGS: There are 5 non rib-bearing lumbar type vertebral bodies.  There is unchanged caudal extension of previously noted paraspinal fusion hardware, now extending from L3-S1.  Interval removal of previously noted right-sided L5 pedicular screw. Bilateral pedicular screws are seen at the L3, L4 and S1 vertebral body levels. Post suspected L3 laminectomy. No definite evidence of hardware failure or loosening. Stable sequela of L3-L4 and L4-L5 intervertebral disc space replacement.  Remaining vertebral body heights appear preserved.  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  Limited visualization the bilateral SI joints is normal.  Moderate colonic stool burden without evidence of enteric obstruction. A prominent phlebolith overlies the left hemipelvis. Post cholecystectomy.  IMPRESSION: 1. Post L3-S1 paraspinal fusion including removal of the right sided L5 pedicular screw. No evidence of hardware failure or loosening. 2. 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   Electronically Signed   By: Simonne Come M.D.   On: 04/08/2015 13:28   Dg Lumbar Spine 2-3 Views  03/17/2015   CLINICAL DATA:  Imaging for lumbar spine fusion.  EXAM: LUMBAR SPINE - 2-3 VIEW; DG C-ARM 61-120  MIN  COMPARISON:  Lumbar CT, 02/18/2015.  FLUOROSCOPY TIME:  Radiation Exposure Index (as provided by the fluoroscopic device):  If the device does not provide the exposure index:  Fluoroscopy Time:  0 minutes and 34 seconds  Number of Acquired Images:  2  FINDINGS: Images show extension of the fusion. Pedicle screws are now noted in L3, L4 and S1 on the right and L3, L4, L5 and S1 on the left. The screw noted in the fractured pedicle of L5 on the right on the prior imaging has been removed.  Interconnecting rods span the pedicle screws. The orthopedic hardware appears well seated and aligned. Radiolucent disc spacers maintained disc height at L3-L4 and L4-L5. Preview anterolisthesis of L4 on L5 and L5 on S1 is stable.  There are no new fractures or evidence of an operative complication.  IMPRESSION: Imaging for lumbar spine fusion as detailed.   Electronically Signed   By: Amie Portland M.D.   On: 03/17/2015 16:52   Ct Lumbar Spine Wo Contrast  04/10/2015   CLINICAL DATA:  74 year old female post 2 prior lumbar surgeries, most recent 03/17/2015 presenting with right sided low back pain and right sacroiliac joint discomfort. Subsequent encounter.  EXAM: CT LUMBAR SPINE WITHOUT CONTRAST  TECHNIQUE: Multidetector CT imaging of the lumbar spine was performed without intravenous contrast administration. Multiplanar CT image reconstructions were also generated.  COMPARISON:  04/08/2015 plain film exam of the lumbar spine. 03/17/2015 intraoperative C-arm views. 02/18/2015 postmyelogram CT.  FINDINGS: Last fully open disk space is labeled L5-S1. Present examination incorporates from T12 through lower sacrum.  Revision of fusion now extending from L3 to S1 with removal of right L5 pedicle screw with fracture of the right L5 pedicle and base of transverse process. Loosening left L5 pedicle screw.  Fracture of the sacrum. Fracture line extends from the pedicle screws (more notable on the right) and extends laterally,  posteriorly and on the right anteriorly. Fracture fragment of the posterior superior aspect of the S1 vertebral body with 1-2 mm gap and posterior displacement.  T11-12:  Mild bulge.  Mild facet joint degenerative changes.  L1-2: Prominent disc degeneration with disc space narrowing and disc osteophyte complex greater right lateral position. Slight encroachment upon the exiting right L1 nerve root. Facet joint degenerative changes. Ligamentum flavum hypertrophy. Mild spinal stenosis.  L2-3: Prominent disc degeneration with subchondral cyst of adjacent endplates. Broad-based disc osteophyte complex. Lateral extension approaching exiting L2 nerve roots greater on the left. Facet joint degenerative changes. Ligamentum flavum hypertrophy. Mild moderate spinal stenosis.  L3-4: Prior attempted fusion. No osseous fusion across the disc space. Minimal subsiding of the interbody spacer. No posterior element fusion. Evaluation of neural foramen and canal limited by streak artifact and scar tissue.  L4-5: Prior attempted fusion. No fusion across the disc space. Slight subsiding of the interbody spacer into the  superior L5 endplate. There may be small areas of fusion of posterior elements on the right. 2 mm anterior slip of L4. Limited evaluation of neural foramen and thecal sac secondary to metallic artifact and scar.  L5-S1: Prior attempted fusion. 5 mm anterior slip of L5. The posterior inferior aspect of the L5 vertebra impresses upon the posterior superior S1 fracture fragment. Foraminal narrowing greater on the right.  Postop changes paraspinal musculature. Limited for detection of postoperative leak or infection.  Atherosclerotic type changes with calcified plaque of aorta, iliac arteries and aortic branch vessels. Portions visualized without aneurysmal dilation. Ectatic splenic artery.  Sigmoid diverticula.  Postcholecystectomy.  IMPRESSION: Revision of fusion now extending from L3 to S1 with removal of right L5 pedicle  screw with fracture of the right L5 pedicle and base of transverse process. Loosening left L5 pedicle screw.  Fracture of the sacrum. Fracture line extends from the pedicle screws (more notable on the right) and extends laterally, posteriorly and on the right anteriorly. Fracture fragment of the posterior superior aspect of the S1 vertebral body with 1-2 mm gap and posterior displacement.  Prior attempted fusion L3-4, L4-5 and L5-S1 as noted above.  Degenerative changes T12-L1 through L2-3 as discussed above.  Atherosclerotic type changes aorta, iliac arteries and branch vessels.  These results will be called to the ordering clinician or representative by the Radiologist Assistant, and communication documented in the PACS or zVision Dashboard.   Electronically Signed   By: Lacy Duverney M.D.   On: 04/10/2015 09:28   Dg C-arm 1-60 Min  03/17/2015   CLINICAL DATA:  Imaging for lumbar spine fusion.  EXAM: LUMBAR SPINE - 2-3 VIEW; DG C-ARM 61-120 MIN  COMPARISON:  Lumbar CT, 02/18/2015.  FLUOROSCOPY TIME:  Radiation Exposure Index (as provided by the fluoroscopic device):  If the device does not provide the exposure index:  Fluoroscopy Time:  0 minutes and 34 seconds  Number of Acquired Images:  2  FINDINGS: Images show extension of the fusion. Pedicle screws are now noted in L3, L4 and S1 on the right and L3, L4, L5 and S1 on the left. The screw noted in the fractured pedicle of L5 on the right on the prior imaging has been removed.  Interconnecting rods span the pedicle screws. The orthopedic hardware appears well seated and aligned. Radiolucent disc spacers maintained disc height at L3-L4 and L4-L5. Preview anterolisthesis of L4 on L5 and L5 on S1 is stable.  There are no new fractures or evidence of an operative complication.  IMPRESSION: Imaging for lumbar spine fusion as detailed.   Electronically Signed   By: Amie Portland M.D.   On: 03/17/2015 16:52    Antibiotics:  Anti-infectives    None       Discharge Exam: Blood pressure 119/64, pulse 67, temperature 98.4 F (36.9 C), temperature source Oral, resp. rate 18, height 4' 1.5" (1.257 m), weight 147 lb (66.679 kg), SpO2 99 %. Neurologic: Grossly normal, good dorsi and plantar flexion strength.   Discharge Medications:     Medication List    STOP taking these medications        butalbital-acetaminophen-caffeine 50-325-40 MG per tablet  Commonly known as:  FIORICET, ESGIC      TAKE these medications        ALPHAGAN P 0.1 % Soln  Generic drug:  brimonidine  Place 1 drop into both eyes every 12 (twelve) hours.     ALPRAZolam 0.25 MG tablet  Commonly known as:  XANAX  Take 1 tablet (0.25 mg total) by mouth 2 (two) times daily as needed for anxiety. Do not take if not needed for anxiety.     atorvastatin 40 MG tablet  Commonly known as:  LIPITOR  take 1 tablet by mouth once daily     CALCIUM 600 + D PO  Take 1 tablet by mouth 2 (two) times daily.     dorzolamide-timolol 22.3-6.8 MG/ML ophthalmic solution  Commonly known as:  COSOPT  Place 1 drop into both eyes 2 (two) times daily.     esomeprazole 40 MG capsule  Commonly known as:  NEXIUM  Take 1 capsule (40 mg total) by mouth daily before breakfast.     fentaNYL 50 MCG/HR  Commonly known as:  DURAGESIC - dosed mcg/hr  Place 1 patch (50 mcg total) onto the skin every 3 (three) days.     Fish Oil 1000 MG Caps  Take 1 capsule by mouth 2 (two) times daily.     FLUOCINOLONE ACETONIDE SCALP 0.01 % Oil  Apply 1 application topically daily as needed (psoriasis).     gabapentin 300 MG capsule  Commonly known as:  NEURONTIN  1 capsule po qam; 2 capsules po in afternoon, 2 capsules po qhs     glucosamine-chondroitin 500-400 MG tablet  Take 1 tablet by mouth 2 (two) times daily.     hydrochlorothiazide 12.5 MG capsule  Commonly known as:  MICROZIDE  Take 1 capsule (12.5 mg total) by mouth daily.     latanoprost 0.005 % ophthalmic solution  Commonly known  as:  XALATAN  Place 1 drop into both eyes at bedtime.     lisinopril 20 MG tablet  Commonly known as:  PRINIVIL,ZESTRIL  Take 1 tablet (20 mg total) by mouth daily.     loratadine 10 MG tablet  Commonly known as:  CLARITIN  Take 10 mg by mouth daily as needed for allergies.     multivitamin with minerals tablet  Take 1 tablet by mouth daily.     oxybutynin 10 MG 24 hr tablet  Commonly known as:  DITROPAN-XL  Take 1 tablet (10 mg total) by mouth daily.     Oxycodone HCl 10 MG Tabs  Take 1 tablet (10 mg total) by mouth every 6 (six) hours as needed for severe pain.     sertraline 100 MG tablet  Commonly known as:  ZOLOFT  Take 1 tablet (100 mg total) by mouth daily.     tiZANidine 4 MG tablet  Commonly known as:  ZANAFLEX  Take 1 tablet (4 mg total) by mouth every 8 (eight) hours as needed for muscle spasms.     triamcinolone 0.025 % cream  Commonly known as:  KENALOG  Apply 1 application topically 2 (two) times daily as needed (psoriasis).     VESICARE 5 MG tablet  Generic drug:  solifenacin  Take 5 mg by mouth daily.        Disposition: home   Final Dx: sacral insufficiency fracture      Discharge Instructions    Call MD for:  difficulty breathing, headache or visual disturbances    Complete by:  As directed      Call MD for:  persistant nausea and vomiting    Complete by:  As directed      Call MD for:  redness, tenderness, or signs of infection (pain, swelling, redness, odor or green/yellow discharge around incision site)    Complete by:  As directed  Call MD for:  severe uncontrolled pain    Complete by:  As directed      Call MD for:  temperature >100.4    Complete by:  As directed      Diet - low sodium heart healthy    Complete by:  As directed      Discharge instructions    Complete by:  As directed   No bending or twisting, may be up in brace, no driving, no strenuous activity     Increase activity slowly    Complete by:  As directed             Follow-up Information    Follow up with Tia Alert, MD On 04/09/2015.   Specialty:  Neurosurgery   Why:  3:00 pm   Contact information:   [address] [place] [address] [phone]       Follow up with Tia Alert, MD. Schedule an appointment as soon as possible for a visit in 10 days.   Specialty:  Neurosurgery   Contact information:   [address] [place] [address] [phone]        Signed: Tia Alert 04/16/2015, 10:25 AM

## 2015-04-16 NOTE — Evaluation (Signed)
Physical Therapy Evaluation Patient Details Name: Melissa Gonzales MRN: [id number] DOB: 06/13/1941 Today's Date: 04/16/2015   History of Present Illness  pt presents after multiple recent Lumbar surgeries due to fall and fx around L5 which required hardware extension to Sacral level, but was now found to have fx around sacral hardware.  pt with hx of GERD, Anxiety, Depression, and Glaucoma.    Clinical Impression  Pt nervous about mobility since being in bed for 10 days per pt report.  Pt did not experience any dizziness or lightheadedness throughout session.  Pt and family ed on donning/doffing TLSO with leg extension and use for home.  Discussed MD's wishes for pt to have increased rest at home, but that she was able to mobilize for toileting and meals.  Feel as pt heals, she will benefit from further therapies to increase overall core strength and improve balance with mobility.  Will need 24hr S at home.      Follow Up Recommendations Supervision/Assistance - 24 hour (Per CM note no HHPT at this time per MD.  )    Equipment Recommendations  None recommended by PT    Recommendations for Other Services       Precautions / Restrictions Precautions Precautions: Back Precaution Comments: pt already had back handout from previous surgeries.  pt able to verbalize 3/3 back precautions.   Required Braces or Orthoses: Spinal Brace Spinal Brace: Thoracolumbosacral orthotic;Applied in sitting position Restrictions Weight Bearing Restrictions: No      Mobility  Bed Mobility Overal bed mobility: Needs Assistance Bed Mobility: Rolling;Sidelying to Sit;Sit to Sidelying Rolling: Supervision Sidelying to sit: Min guard     Sit to sidelying: Min assist General bed mobility comments: pt able to recall log roll technique from previous hospitalizations, but did need cues for return to bed technique.    Transfers Overall transfer level: Needs assistance Equipment used: Rolling walker (2  wheeled) Transfers: Sit to/from Stand Sit to Stand: Min guard         General transfer comment: cues for UE use with coming to stand and returning to sitting.    Ambulation/Gait Ambulation/Gait assistance: Min guard Ambulation Distance (Feet): 40 Feet Assistive device: Rolling walker (2 wheeled) Gait Pattern/deviations: Step-through pattern;Decreased stride length     General Gait Details: pt demonstrates good use of RW.    Stairs            Wheelchair Mobility    Modified Rankin (Stroke Patients Only)       Balance Overall balance assessment: Needs assistance Sitting-balance support: No upper extremity supported;Feet supported Sitting balance-Leahy Scale: Good     Standing balance support: Bilateral upper extremity supported;During functional activity;No upper extremity supported Standing balance-Leahy Scale: Fair Standing balance comment: pt able to stand without UE support, but needs UE support for any balance challenges.                               Pertinent Vitals/Pain Pain Assessment: 0-10 Pain Score: 5  Pain Location: R hip and lower back Pain Descriptors / Indicators: Aching Pain Intervention(s): Monitored during session;Premedicated before session;Repositioned;Limited activity within patient's tolerance    Home Living Family/patient expects to be discharged to:: Private residence Living Arrangements: Alone Available Help at Discharge: Family;Available 24 hours/day Type of Home: House Home Access: Stairs to enter Entrance Stairs-Rails: Left;Right;Can reach both Entrance Stairs-Number of Steps: 3 Home Layout: One level Home Equipment: Walker - 2 wheels;Bedside commode;Shower seat  Prior Function Level of Independence: Independent (Prior to most recent back surgeries.)               Hand Dominance   Dominant Hand: Right    Extremity/Trunk Assessment   Upper Extremity Assessment: Overall WFL for tasks assessed            Lower Extremity Assessment: Generalized weakness         Communication   Communication: No difficulties  Cognition Arousal/Alertness: Awake/alert Behavior During Therapy: WFL for tasks assessed/performed Overall Cognitive Status: Within Functional Limits for tasks assessed                      General Comments      Exercises        Assessment/Plan    PT Assessment Patient needs continued PT services  PT Diagnosis Difficulty walking;Acute pain   PT Problem List Decreased strength;Decreased activity tolerance;Decreased balance;Decreased mobility;Decreased knowledge of use of DME;Decreased knowledge of precautions;Pain  PT Treatment Interventions DME instruction;Gait training;Stair training;Functional mobility training;Therapeutic activities;Therapeutic exercise;Balance training;Neuromuscular re-education;Patient/family education   PT Goals (Current goals can be found in the Care Plan section) Acute Rehab PT Goals Patient Stated Goal: go home PT Goal Formulation: With patient Time For Goal Achievement: 04/23/15 Potential to Achieve Goals: Good    Frequency Min 5X/week   Barriers to discharge        Co-evaluation               End of Session Equipment Utilized During Treatment: Gait belt;Back brace Activity Tolerance: Patient tolerated treatment well Patient left: in bed;with call bell/phone within reach;with family/visitor present Nurse Communication: Mobility status         Time: 1229-1308 PT Time Calculation (min) (ACUTE ONLY): 39 min   Charges:   PT Evaluation $Initial PT Evaluation Tier I: 1 Procedure PT Treatments $Gait Training: 8-22 mins $Therapeutic Activity: 8-22 mins   PT G CodesSunny Schlein, [place] [phone] 04/16/2015, 2:06 PM

## 2015-04-16 NOTE — Care Management Note (Signed)
Case Management Note  Patient Details  Name: Melissa Gonzales MRN: [id number] Date of Birth: 1941-04-16  Subjective/Objective:                    Action/Plan: Spoke with Dr Yetta Barre to discuss discharge planning. Dr Yetta Barre IS aware that patient was previously being seen by [facility] for HH[facility].  Due to patient's current injury and bracing, Dr Yetta Barre does NOT want home health continued at discharge.  Miranda with [facility] was notified of changes at discharge.  Expected Discharge Date:                  Expected Discharge Plan:  Home/Self Care  In-House Referral:     Discharge planning Services     Post Acute Care Choice:    Choice offered to:  Patient  DME Arranged:    DME Agency:   (also pays for "personal Choice" aide out of pocket.)  HH Arranged:   (Patient was active with [facility] prior to admission) HH Agency:     Status of Service:  Completed, signed off  Medicare Important Message Given:  Yes Date Medicare IM Given:  04/12/15 Medicare IM give by:  Elmer Bales RN, MSN, CM Date Additional Medicare IM Given:  04/15/15 Additional Medicare Important Message give by:  Elmer Bales RN, MSN, CM  If discussed at Long Length of Stay Meetings, dates discussed:    Additional Comments:  Anda Kraft, RN 04/16/2015, 10:51 AM

## 2015-05-05 ENCOUNTER — Other Ambulatory Visit: Payer: Self-pay | Admitting: *Deleted

## 2015-05-05 ENCOUNTER — Telehealth: Payer: Self-pay | Admitting: Family Medicine

## 2015-05-05 ENCOUNTER — Other Ambulatory Visit: Payer: Self-pay | Admitting: Family Medicine

## 2015-05-05 MED ORDER — HYDROCHLOROTHIAZIDE 12.5 MG PO CAPS: 12.5000 mg | ORAL_CAPSULE | Freq: Every day | ORAL | Status: DC

## 2015-05-05 NOTE — Telephone Encounter (Signed)
Prescription for Xanax refill left at front desk for Melissa Gonzales to pick up at her convenience.  Dr. Caroleen Hammanumley

## 2015-05-05 NOTE — Telephone Encounter (Signed)
Pt informed. Fleeger, Jessica Dawn  

## 2015-10-25 ENCOUNTER — Emergency Department (HOSPITAL_COMMUNITY)
Admission: EM | Admit: 2015-10-25 | Discharge: 2015-10-25 | Disposition: A | Discharge status: Other Acute Inpatient Hospital | Payer: Medicare Other | Source: Home / Self Care | Attending: Family Medicine | Admitting: Family Medicine

## 2015-10-25 ENCOUNTER — Encounter (HOSPITAL_COMMUNITY): Payer: Self-pay | Admitting: Emergency Medicine

## 2015-10-25 ENCOUNTER — Emergency Department (INDEPENDENT_AMBULATORY_CARE_PROVIDER_SITE_OTHER): Payer: Medicare Other

## 2015-10-25 ENCOUNTER — Encounter (HOSPITAL_COMMUNITY): Payer: Self-pay | Admitting: *Deleted

## 2015-10-25 ENCOUNTER — Inpatient Hospital Stay (HOSPITAL_COMMUNITY)
Admission: EM | Admit: 2015-10-25 | Discharge: 2015-10-29 | DRG: 194 | Disposition: A | Discharge status: Home / Self Care | Payer: Medicare Other | Attending: Internal Medicine | Admitting: Internal Medicine

## 2015-10-25 DIAGNOSIS — J189 Pneumonia, unspecified organism: Principal | ICD-10-CM | POA: Diagnosis present

## 2015-10-25 DIAGNOSIS — I10 Essential (primary) hypertension: Secondary | ICD-10-CM | POA: Diagnosis present

## 2015-10-25 DIAGNOSIS — E785 Hyperlipidemia, unspecified: Secondary | ICD-10-CM | POA: Diagnosis present

## 2015-10-25 DIAGNOSIS — F32A Depression, unspecified: Secondary | ICD-10-CM | POA: Diagnosis present

## 2015-10-25 DIAGNOSIS — J918 Pleural effusion in other conditions classified elsewhere: Secondary | ICD-10-CM | POA: Diagnosis present

## 2015-10-25 DIAGNOSIS — J948 Other specified pleural conditions: Secondary | ICD-10-CM

## 2015-10-25 DIAGNOSIS — J90 Pleural effusion, not elsewhere classified: Secondary | ICD-10-CM

## 2015-10-25 DIAGNOSIS — F411 Generalized anxiety disorder: Secondary | ICD-10-CM | POA: Diagnosis present

## 2015-10-25 DIAGNOSIS — F419 Anxiety disorder, unspecified: Secondary | ICD-10-CM | POA: Diagnosis present

## 2015-10-25 DIAGNOSIS — N3941 Urge incontinence: Secondary | ICD-10-CM | POA: Diagnosis present

## 2015-10-25 DIAGNOSIS — H409 Unspecified glaucoma: Secondary | ICD-10-CM | POA: Diagnosis present

## 2015-10-25 DIAGNOSIS — Z79899 Other long term (current) drug therapy: Secondary | ICD-10-CM

## 2015-10-25 DIAGNOSIS — K219 Gastro-esophageal reflux disease without esophagitis: Secondary | ICD-10-CM | POA: Diagnosis present

## 2015-10-25 DIAGNOSIS — G894 Chronic pain syndrome: Secondary | ICD-10-CM | POA: Diagnosis present

## 2015-10-25 DIAGNOSIS — F329 Major depressive disorder, single episode, unspecified: Secondary | ICD-10-CM | POA: Diagnosis present

## 2015-10-25 DIAGNOSIS — F39 Unspecified mood [affective] disorder: Secondary | ICD-10-CM | POA: Diagnosis present

## 2015-10-25 DIAGNOSIS — F418 Other specified anxiety disorders: Secondary | ICD-10-CM | POA: Diagnosis present

## 2015-10-25 DIAGNOSIS — E7800 Pure hypercholesterolemia, unspecified: Secondary | ICD-10-CM | POA: Diagnosis present

## 2015-10-25 DIAGNOSIS — R4589 Other symptoms and signs involving emotional state: Secondary | ICD-10-CM | POA: Diagnosis present

## 2015-10-25 LAB — COMPREHENSIVE METABOLIC PANEL
ALT: 34 U/L (ref 14–54)
ANION GAP: 11 (ref 5–15)
AST: 32 U/L (ref 15–41)
Albumin: 3.1 g/dL — ABNORMAL LOW (ref 3.5–5.0)
Alkaline Phosphatase: 101 U/L (ref 38–126)
BUN: 12 mg/dL (ref 6–20)
CHLORIDE: 99 mmol/L — AB (ref 101–111)
CO2: 26 mmol/L (ref 22–32)
Calcium: 9.4 mg/dL (ref 8.9–10.3)
Creatinine, Ser: 0.75 mg/dL (ref 0.44–1.00)
GFR calc Af Amer: >60 mL/min (ref >60)
GFR calc non Af Amer: >60 mL/min (ref >60)
Glucose, Bld: 115 mg/dL — ABNORMAL HIGH (ref 65–99)
Potassium: 3.5 mmol/L (ref 3.5–5.1)
Sodium: 136 mmol/L (ref 135–145)
Total Bilirubin: 0.3 mg/dL (ref 0.3–1.2)
Total Protein: 7.4 g/dL (ref 6.5–8.1)

## 2015-10-25 LAB — CBC WITH DIFFERENTIAL/PLATELET
Basophils Absolute: 0.1 10*3/uL (ref 0.0–0.1)
Basophils Relative: 1 %
Eosinophils Absolute: 0.5 10*3/uL (ref 0.0–0.7)
Eosinophils Relative: 4 %
HCT: 43.8 % (ref 36.0–46.0)
Hemoglobin: 14.8 g/dL (ref 12.0–15.0)
LYMPHS ABS: 1.2 10*3/uL (ref 0.7–4.0)
LYMPHS PCT: 9 %
MCH: 33 pg (ref 26.0–34.0)
MCHC: 33.8 g/dL (ref 30.0–36.0)
MCV: 97.8 fL (ref 78.0–100.0)
MONO ABS: 1.3 10*3/uL — AB (ref 0.1–1.0)
Monocytes Relative: 10 %
NEUTROS ABS: 10.6 10*3/uL — AB (ref 1.7–7.7)
Neutrophils Relative %: 76 %
Platelets: 442 10*3/uL — ABNORMAL HIGH (ref 150–400)
RBC: 4.48 MIL/uL (ref 3.87–5.11)
RDW: 13 % (ref 11.5–15.5)
WBC: 13.7 10*3/uL — ABNORMAL HIGH (ref 4.0–10.5)

## 2015-10-25 LAB — I-STAT CG4 LACTIC ACID, ED: Lactic Acid, Venous: 0.81 mmol/L (ref 0.5–2.0)

## 2015-10-25 MED ORDER — DEXTROSE 5 % IV SOLN
500.0000 mg | Freq: Once | INTRAVENOUS | Status: AC
  Administered 2015-10-25: 500 mg via INTRAVENOUS
  Filled 2015-10-25: qty 500

## 2015-10-25 MED ORDER — DEXTROSE 5 % IV SOLN
1.0000 g | Freq: Once | INTRAVENOUS | Status: AC
  Administered 2015-10-25: 1 g via INTRAVENOUS
  Filled 2015-10-25: qty 10

## 2015-10-25 MED ORDER — GUAIFENESIN-DM 100-10 MG/5ML PO SYRP
5.0000 mL | ORAL_SOLUTION | ORAL | Status: DC | PRN
  Administered 2015-10-25: 5 mL via ORAL
  Filled 2015-10-25: qty 5

## 2015-10-25 NOTE — ED Notes (Signed)
MD at bedside.Rancour 

## 2015-10-25 NOTE — ED Provider Notes (Signed)
CSN: [id number]     Arrival date & time 10/25/15  1633 History   First MD Initiated Contact with Patient 10/25/15 1802     Chief Complaint  Patient presents with  . Follow-up   (Consider location/radiation/quality/duration/timing/severity/associated sxs/prior Treatment) Patient is a 74 y.o. female presenting with cough. The history is provided by the patient and a relative.  Cough Cough characteristics:  Productive Sputum characteristics:  Yellow Severity:  Moderate Onset quality:  Gradual Duration:  5 days Progression:  Unchanged Chronicity:  New Smoker: no   Context comment:  Given z-pack 5d ago for cap, sx not improved, never with fever, seen at [facility]. Associated symptoms: shortness of breath   Associated symptoms: no fever     Past Medical History  Diagnosis Date  . Anxiety   . GERD (gastroesophageal reflux disease)   . Hyperlipidemia   . Hypertension   . Depression   . Glaucoma     Bilateral eyes  . Pneumonia     hx of  . Urgency of urination   . Diverticulosis   . Arthritis    Past Surgical History  Procedure Laterality Date  . Cholecystectomy  1993  . Vaginal hysterectomy  1970s    uterine prolapse  . Maximum access (mas)posterior lumbar interbody fusion (plif) 2 level N/A 11/27/2014    Procedure: LUMBAR THREE-FOUR, LUMBAR FOUR-FIVE MAXIMUM ACCESS POSTERIOR LUMBAR INTERBODY FUSION;  Surgeon: Tia Alertavid S Jones, MD;  Location: [facility];  Service: Neurosurgery;  Laterality: N/A;  L3-4 L4-5 MAXIMUM ACCESS POSTERIOR LUMBAR INTERBODY FUSION  . Eye surgery      Cataract Surgery Bilateral, 5 years ago  . Colonoscopy    . Maximum access (mas)posterior lumbar interbody fusion (plif) 1 level N/A 03/17/2015    Procedure: Lumbar five -sacral one Maximum access posterior lumbar interbody fusion/Lumbar two-three  Laminectomy and extension of instrumentation to Sacral-one;  Surgeon: Tia Alertavid S Jones, MD;  Location: [facility];  Service: Neurosurgery;  Laterality: N/A;    Family History  Problem Relation Age of Onset  . Mental illness Daughter   . Drug abuse Grandchild   . Heart disease Mother [id number]    died MI age 75  . Hyperlipidemia Sister   . Hypertension Sister   . Diabetes Neg Hx   . Cancer Father     Primary Cell Liver Cancer   Social History  Substance Use Topics  . Smoking status: Never Smoker   . Smokeless tobacco: None  . Alcohol Use: 0.6 oz/week    1 Glasses of wine per week     Comment: 1 glass per night   OB History    No data available     Review of Systems  Constitutional: Positive for activity change. Negative for fever and appetite change.  HENT: Positive for postnasal drip.   Respiratory: Positive for cough and shortness of breath.   Cardiovascular: Negative.   Neurological: Positive for weakness.  All other systems reviewed and are negative.   Allergies  Review of patient's allergies indicates no known allergies.  Home Medications   Prior to Admission medications   Medication Sig Start Date End Date Taking? Authorizing Provider  ALPHAGAN P 0.1 % SOLN Place 1 drop into both eyes every 12 (twelve) hours.  04/05/12   Historical Provider, MD  ALPRAZolam (XANAX) 0.25 MG tablet TAKE 1 TABLET BY MOUTH TWICE DAILY AS NEEDED FOR ANXIETY 05/05/15   Havana N Rumley, DO  atorvastatin (LIPITOR) 40 MG tablet take 1 tablet by mouth  once daily Patient taking differently: Take 40 mg by mouth daily at 6 PM.  06/26/14   Lora Havens Rumley, DO  Calcium Carb-Cholecalciferol (CALCIUM 600 + D PO) Take 1 tablet by mouth 2 (two) times daily.    Historical Provider, MD  dorzolamide-timolol (COSOPT) 22.3-6.8 MG/ML ophthalmic solution Place 1 drop into both eyes 2 (two) times daily.  03/20/11   Historical Provider, MD  esomeprazole (NEXIUM) 40 MG capsule Take 1 capsule (40 mg total) by mouth daily before breakfast. 05/22/14   [place]son N Rumley, DO  fentaNYL (DURAGESIC - DOSED MCG/HR) 50 MCG/HR Place 1 patch (50 mcg total) onto the skin every 3 (three)  days. 04/16/15   Tia Alert, MD  FLUOCINOLONE ACETONIDE SCALP 0.01 % OIL Apply 1 application topically daily as needed (psoriasis).  12/02/14   Historical Provider, MD  gabapentin (NEURONTIN) 300 MG capsule 1 capsule po qam; 2 capsules po in afternoon, 2 capsules po qhs Patient not taking: Reported on 11/18/2014 09/14/14   Lenda Kelp, MD  glucosamine-chondroitin 500-400 MG tablet Take 1 tablet by mouth 2 (two) times daily.    Historical Provider, MD  hydrochlorothiazide (MICROZIDE) 12.5 MG capsule Take 1 capsule (12.5 mg total) by mouth daily. 05/05/15   Olin N Rumley, DO  latanoprost (XALATAN) 0.005 % ophthalmic solution Place 1 drop into both eyes at bedtime.  06/21/14   Historical Provider, MD  lisinopril (PRINIVIL,ZESTRIL) 20 MG tablet Take 1 tablet (20 mg total) by mouth daily. 01/18/15   [place] N Rumley, DO  loratadine (CLARITIN) 10 MG tablet Take 10 mg by mouth daily as needed for allergies.     Historical Provider, MD  Multiple Vitamins-Minerals (MULTIVITAMIN WITH MINERALS) tablet Take 1 tablet by mouth daily.    Historical Provider, MD  Omega-3 Fatty Acids (FISH OIL) 1000 MG CAPS Take 1 capsule by mouth 2 (two) times daily.    Historical Provider, MD  oxybutynin (DITROPAN-XL) 10 MG 24 hr tablet Take 1 tablet (10 mg total) by mouth daily. 05/12/13   Dayarmys [place] Peaches, MD  oxyCODONE 10 MG TABS Take 1 tablet (10 mg total) by mouth every 6 (six) hours as needed for severe pain. 04/16/15   Tia Alert, MD  sertraline (ZOLOFT) 100 MG tablet Take 1 tablet (100 mg total) by mouth daily. 12/01/14   Mesic N Rumley, DO  tiZANidine (ZANAFLEX) 4 MG tablet Take 1 tablet (4 mg total) by mouth every 8 (eight) hours as needed for muscle spasms. 04/16/15   Tia Alert, MD  triamcinolone (KENALOG) 0.025 % cream Apply 1 application topically 2 (two) times daily as needed (psoriasis).     Historical Provider, MD  VESICARE 5 MG tablet Take 5 mg by mouth daily. 04/03/15   Historical Provider, MD    Meds Ordered and Administered this Visit  Medications - No data to display  BP 149/73 mmHg  Pulse 81  Temp(Src) 97.9 F (36.6 C) (Oral)  Resp 18  SpO2 97% No data found.   Physical Exam  Constitutional: She is oriented to person, place, and time. She appears well-developed and well-nourished.  HENT:  Right Ear: External ear normal.  Mouth/Throat: Oropharynx is clear and moist.  Eyes: Pupils are equal, round, and reactive to light.  Neck: Normal range of motion. Neck supple.  Cardiovascular: Normal rate, normal heart sounds and intact distal pulses.   Pulmonary/Chest: Effort normal. She has rales.  Lymphadenopathy:    She has no cervical adenopathy.  Neurological: She is  alert and oriented to person, place, and time.  Nursing note and vitals reviewed.   ED Course  Procedures (including critical care time)  Labs Review Labs Reviewed - No data to display  Imaging Review Dg Chest 2 View  10/25/2015  CLINICAL DATA:  Patient told she has right-sided pneumonia at evaluation 4 days ago. EXAM: CHEST  2 VIEW COMPARISON:  None. FINDINGS: Lungs are adequately inflated with a small to moderate right pleural effusion likely with associated atelectasis in the right base. Cannot completely exclude infection in the right base. Suggestion mild emphysematous disease. Cardiomediastinal silhouette is within normal. There are mild degenerate changes of the spine. IMPRESSION: Small to moderate right pleural effusion likely with associated basilar atelectasis. Cannot exclude infection in the right base. Electronically Signed   By: Elberta Fortis M.D.   On: 10/25/2015 18:54   X-rays reviewed and report per radiologist.   Visual Acuity Review  Right Eye Distance:   Left Eye Distance:   Bilateral Distance:    Right Eye Near:   Left Eye Near:    Bilateral Near:         MDM   1. Pleural effusion on right    Sent for eval of right pleural effusion, weak and coughing, told at [facility]  that she had CAP and given z-pack 5 d ago. No improvement.   Linna Hoff, MD 10/25/15 [phone]

## 2015-10-25 NOTE — ED Notes (Signed)
Pt was   Seen    4  Days  Ago  And  Was  Diagnosed with  pnuemonia   She  Was  Given  A   z  Pack  And   An  Inhaler        Pt  Was  Given   A  cxr       She  Reports       Still  Has  Symptoms  Of  Weakness  And  A  Cough        She  Is  Sitting  upight   On   Exam table  Speaking in  Complete  sentanaces

## 2015-10-25 NOTE — ED Notes (Signed)
Pt. transferred from [facility] urgent care , her chest x-ray this evening result shows right pleural effusion , pt. stated recent pneumonia treated with oral antibiotic with occasional productive cough and right anterior ribcage pain when coughing/deep inspiration , denies fever or chills.

## 2015-10-25 NOTE — ED Notes (Signed)
MD at bedside. Dr. Rancour 

## 2015-10-26 ENCOUNTER — Observation Stay (HOSPITAL_COMMUNITY): Payer: Medicare Other

## 2015-10-26 DIAGNOSIS — J918 Pleural effusion in other conditions classified elsewhere: Secondary | ICD-10-CM

## 2015-10-26 DIAGNOSIS — J189 Pneumonia, unspecified organism: Secondary | ICD-10-CM | POA: Diagnosis present

## 2015-10-26 LAB — CBC
HCT: 40 % (ref 36.0–46.0)
HEMOGLOBIN: 13.5 g/dL (ref 12.0–15.0)
MCH: 32.9 pg (ref 26.0–34.0)
MCHC: 33.8 g/dL (ref 30.0–36.0)
MCV: 97.6 fL (ref 78.0–100.0)
PLATELETS: 402 10*3/uL — AB (ref 150–400)
RBC: 4.1 MIL/uL (ref 3.87–5.11)
RDW: 12.9 % (ref 11.5–15.5)
WBC: 11.9 10*3/uL — ABNORMAL HIGH (ref 4.0–10.5)

## 2015-10-26 LAB — HIV ANTIBODY (ROUTINE TESTING W REFLEX): HIV SCREEN 4TH GENERATION: NONREACTIVE

## 2015-10-26 LAB — INFLUENZA PANEL BY PCR (TYPE A & B)
H1N1 flu by pcr: NOT DETECTED
INFLAPCR: NEGATIVE
INFLBPCR: NEGATIVE

## 2015-10-26 LAB — STREP PNEUMONIAE URINARY ANTIGEN: STREP PNEUMO URINARY ANTIGEN: NEGATIVE

## 2015-10-26 MED ORDER — DARIFENACIN HYDROBROMIDE ER 15 MG PO TB24
15.0000 mg | ORAL_TABLET | Freq: Every day | ORAL | Status: DC
  Administered 2015-10-26 – 2015-10-29 (×4): 15 mg via ORAL
  Filled 2015-10-26 (×4): qty 1

## 2015-10-26 MED ORDER — GUAIFENESIN ER 600 MG PO TB12
600.0000 mg | ORAL_TABLET | Freq: Two times a day (BID) | ORAL | Status: DC
  Administered 2015-10-26 – 2015-10-29 (×8): 600 mg via ORAL
  Filled 2015-10-26 (×8): qty 1

## 2015-10-26 MED ORDER — IPRATROPIUM-ALBUTEROL 0.5-2.5 (3) MG/3ML IN SOLN: 3.0000 mL | RESPIRATORY_TRACT | Status: DC | PRN

## 2015-10-26 MED ORDER — LATANOPROST 0.005 % OP SOLN
1.0000 [drp] | Freq: Every day | OPHTHALMIC | Status: DC
  Administered 2015-10-26 (×2): 1 [drp] via OPHTHALMIC
  Filled 2015-10-26 (×2): qty 2.5

## 2015-10-26 MED ORDER — PANTOPRAZOLE SODIUM 40 MG PO TBEC
40.0000 mg | DELAYED_RELEASE_TABLET | Freq: Every day | ORAL | Status: DC
  Administered 2015-10-26 – 2015-10-29 (×4): 40 mg via ORAL
  Filled 2015-10-26 (×4): qty 1

## 2015-10-26 MED ORDER — TIZANIDINE HCL 2 MG PO TABS: 4.0000 mg | ORAL_TABLET | Freq: Three times a day (TID) | ORAL | Status: DC | PRN

## 2015-10-26 MED ORDER — ENOXAPARIN SODIUM 40 MG/0.4ML ~~LOC~~ SOLN
40.0000 mg | SUBCUTANEOUS | Status: DC
  Administered 2015-10-26 – 2015-10-28 (×3): 40 mg via SUBCUTANEOUS
  Filled 2015-10-26 (×3): qty 0.4

## 2015-10-26 MED ORDER — IPRATROPIUM-ALBUTEROL 0.5-2.5 (3) MG/3ML IN SOLN
3.0000 mL | Freq: Four times a day (QID) | RESPIRATORY_TRACT | Status: DC
  Administered 2015-10-26 – 2015-10-27 (×6): 3 mL via RESPIRATORY_TRACT
  Filled 2015-10-26 (×6): qty 3

## 2015-10-26 MED ORDER — GLUCOSAMINE-CHONDROITIN 500-400 MG PO TABS: 1.0000 | ORAL_TABLET | Freq: Two times a day (BID) | ORAL | Status: DC

## 2015-10-26 MED ORDER — ATORVASTATIN CALCIUM 40 MG PO TABS
40.0000 mg | ORAL_TABLET | Freq: Every day | ORAL | Status: DC
  Administered 2015-10-26 – 2015-10-28 (×3): 40 mg via ORAL
  Filled 2015-10-26 (×3): qty 1

## 2015-10-26 MED ORDER — ALPRAZOLAM 0.25 MG PO TABS: 0.2500 mg | ORAL_TABLET | Freq: Two times a day (BID) | ORAL | Status: DC | PRN

## 2015-10-26 MED ORDER — LISINOPRIL 20 MG PO TABS
20.0000 mg | ORAL_TABLET | Freq: Every day | ORAL | Status: DC
Start: 2015-10-26 — End: 2015-10-29
  Administered 2015-10-26 – 2015-10-29 (×4): 20 mg via ORAL
  Filled 2015-10-26 (×4): qty 1

## 2015-10-26 MED ORDER — MORPHINE SULFATE (PF) 2 MG/ML IV SOLN
2.0000 mg | INTRAVENOUS | Status: AC | PRN
  Administered 2015-10-27 – 2015-10-28 (×3): 2 mg via INTRAVENOUS
  Filled 2015-10-26 (×3): qty 1

## 2015-10-26 MED ORDER — DEXTROSE 5 % IV SOLN
500.0000 mg | INTRAVENOUS | Status: DC
  Administered 2015-10-26 – 2015-10-28 (×3): 500 mg via INTRAVENOUS
  Filled 2015-10-26 (×4): qty 500

## 2015-10-26 MED ORDER — LORATADINE 10 MG PO TABS: 10.0000 mg | ORAL_TABLET | Freq: Every day | ORAL | Status: DC | PRN

## 2015-10-26 MED ORDER — OXYCODONE HCL 5 MG PO TABS
5.0000 mg | ORAL_TABLET | Freq: Four times a day (QID) | ORAL | Status: AC | PRN
  Administered 2015-10-26 – 2015-10-27 (×3): 5 mg via ORAL
  Filled 2015-10-26 (×3): qty 1

## 2015-10-26 MED ORDER — BENZONATATE 100 MG PO CAPS
200.0000 mg | ORAL_CAPSULE | Freq: Three times a day (TID) | ORAL | Status: DC | PRN
  Administered 2015-10-26 – 2015-10-29 (×6): 200 mg via ORAL
  Filled 2015-10-26 (×8): qty 2

## 2015-10-26 MED ORDER — TIMOLOL MALEATE 0.5 % OP SOLN
1.0000 [drp] | Freq: Two times a day (BID) | OPHTHALMIC | Status: DC
  Administered 2015-10-26 – 2015-10-28 (×7): 1 [drp] via OPHTHALMIC
  Filled 2015-10-26: qty 5

## 2015-10-26 MED ORDER — DORZOLAMIDE HCL 2 % OP SOLN
1.0000 [drp] | Freq: Two times a day (BID) | OPHTHALMIC | Status: DC
  Administered 2015-10-26 – 2015-10-28 (×7): 1 [drp] via OPHTHALMIC
  Filled 2015-10-26: qty 10

## 2015-10-26 MED ORDER — HYDROCHLOROTHIAZIDE 12.5 MG PO CAPS
12.5000 mg | ORAL_CAPSULE | Freq: Every day | ORAL | Status: DC
  Administered 2015-10-26 – 2015-10-29 (×4): 12.5 mg via ORAL
  Filled 2015-10-26 (×4): qty 1

## 2015-10-26 MED ORDER — DORZOLAMIDE HCL-TIMOLOL MAL 2-0.5 % OP SOLN
1.0000 [drp] | Freq: Two times a day (BID) | OPHTHALMIC | Status: DC
  Filled 2015-10-26: qty 10

## 2015-10-26 MED ORDER — CEFTRIAXONE SODIUM 1 G IJ SOLR
1.0000 g | INTRAMUSCULAR | Status: DC
  Administered 2015-10-26 – 2015-10-28 (×3): 1 g via INTRAVENOUS
  Filled 2015-10-26 (×3): qty 10

## 2015-10-26 MED ORDER — ADULT MULTIVITAMIN W/MINERALS CH
1.0000 | ORAL_TABLET | Freq: Every day | ORAL | Status: DC
  Administered 2015-10-26 – 2015-10-29 (×4): 1 via ORAL
  Filled 2015-10-26 (×4): qty 1

## 2015-10-26 MED ORDER — BRIMONIDINE TARTRATE 0.2 % OP SOLN
1.0000 [drp] | Freq: Two times a day (BID) | OPHTHALMIC | Status: DC
  Administered 2015-10-26 – 2015-10-28 (×7): 1 [drp] via OPHTHALMIC
  Filled 2015-10-26: qty 5

## 2015-10-26 MED ORDER — OMEGA-3-ACID ETHYL ESTERS 1 G PO CAPS
1.0000 g | ORAL_CAPSULE | Freq: Every day | ORAL | Status: DC
  Administered 2015-10-26 – 2015-10-29 (×4): 1 g via ORAL
  Filled 2015-10-26 (×4): qty 1

## 2015-10-26 NOTE — H&P (Addendum)
[facility] History and Physical  HERO MCCATHERN NWG:[id number] DOB: July 06, 1941 DOA: 10/25/2015  Referring physician: ED PCP: Ezequiel Kayser, MD   Chief Complaint: Malaise and cough  HPI:  Patient has a past medical history significant for HTN, HLD, depression, anxiety, and glaucoma; who presents after recently being diagnosed with pneumonia with continued complaints of weakness and  cough. Symptoms initially brought her to urgent care approximately 5 days ago. She was evaluated and was given a Z-Pak for the possibility of a community-acquired pneumonia. Patient reports taking the Z-Pak as advised however symptoms did not improve. She continues to have this productive cough with yellowish sputum production and feel short of breath. She has been taking Mucinex in addition to the antibiotics without any relief of symptoms. She endorses associated symptoms of nasal congestion and postnasal drip. Symptoms worsened with activity. Denies feeling short of breath at rest. Denies any fever, chills, diarrhea, abdominal pain, nausea, or vomiting. Patient knows that she just wasn't feeling better and was advised to return if symptoms did not improve. She went to urgent care today and a chest x-ray revealed pleural effusions. She was transferred to the emergency department for further evaluation. Patient was seen to have decreased oxygen saturations with ambulation to 87% on room air. Initial lab work showed a elevated WBC count of 13.7.  Review of Systems  Constitutional: Negative for fever and chills.  HENT: Negative for ear discharge and ear pain.   Eyes: Negative for pain and discharge.  Respiratory: Positive for cough, sputum production and shortness of breath.   Cardiovascular: Negative for chest pain and palpitations.  Gastrointestinal: Negative for abdominal pain and diarrhea.  Genitourinary: Negative for urgency and frequency.  Musculoskeletal: Negative for back pain and neck pain.  Skin:  Negative for itching and rash.  Neurological: Positive for weakness. Negative for speech change, focal weakness and loss of consciousness.  Endo/Heme/Allergies: Positive for environmental allergies. Negative for polydipsia.  Psychiatric/Behavioral: Negative for hallucinations and substance abuse.       Past Medical History  Diagnosis Date  . Anxiety   . GERD (gastroesophageal reflux disease)   . Hyperlipidemia   . Hypertension   . Depression   . Glaucoma     Bilateral eyes  . Pneumonia     hx of  . Urgency of urination   . Diverticulosis   . Arthritis      Past Surgical History  Procedure Laterality Date  . Cholecystectomy  1993  . Vaginal hysterectomy  1970s    uterine prolapse  . Maximum access (mas)posterior lumbar interbody fusion (plif) 2 level N/A 11/27/2014    Procedure: LUMBAR THREE-FOUR, LUMBAR FOUR-FIVE MAXIMUM ACCESS POSTERIOR LUMBAR INTERBODY FUSION;  Surgeon: Tia Alert, MD;  Location: [facility];  Service: Neurosurgery;  Laterality: N/A;  L3-4 L4-5 MAXIMUM ACCESS POSTERIOR LUMBAR INTERBODY FUSION  . Eye surgery      Cataract Surgery Bilateral, 5 years ago  . Colonoscopy    . Maximum access (mas)posterior lumbar interbody fusion (plif) 1 level N/A 03/17/2015    Procedure: Lumbar five -sacral one Maximum access posterior lumbar interbody fusion/Lumbar two-three  Laminectomy and extension of instrumentation to Sacral-one;  Surgeon: Tia Alert, MD;  Location: [facility];  Service: Neurosurgery;  Laterality: N/A;  . Back surgery        Social History:  reports that she has never smoked. She does not have any smokeless tobacco history on file. She reports that she drinks about 0.6 oz of alcohol  per week. She reports that she does not use illicit drugs. Where does patient live--home Can patient participate in ADLs? Yes  No Known Allergies  Family History  Problem Relation Age of Onset  . Mental illness Daughter   . Drug abuse Grandchild   . Heart  disease Mother 66    died MI age 49  . Hyperlipidemia Sister   . Hypertension Sister   . Diabetes Neg Hx   . Cancer Father     Primary Cell Liver Cancer      Prior to Admission medications   Medication Sig Start Date End Date Taking? Authorizing Provider  ALPHAGAN P 0.1 % SOLN Place 1 drop into both eyes every 12 (twelve) hours.  04/05/12  Yes Historical Provider, MD  ALPRAZolam (XANAX) 0.25 MG tablet TAKE 1 TABLET BY MOUTH TWICE DAILY AS NEEDED FOR ANXIETY 05/05/15  Yes [place] N Rumley, DO  atorvastatin (LIPITOR) 40 MG tablet take 1 tablet by mouth once daily Patient taking differently: Take 40 mg by mouth daily at 6 PM.  06/26/14  Yes [place] N Rumley, DO  Calcium Carb-Cholecalciferol (CALCIUM 600 + D PO) Take 1 tablet by mouth 2 (two) times daily.   Yes Historical Provider, MD  dorzolamide-timolol (COSOPT) 22.3-6.8 MG/ML ophthalmic solution Place 1 drop into both eyes 2 (two) times daily.  03/20/11  Yes Historical Provider, MD  esomeprazole (NEXIUM) 40 MG capsule Take 1 capsule (40 mg total) by mouth daily before breakfast. 05/22/14  Yes [place] N Rumley, DO  glucosamine-chondroitin 500-400 MG tablet Take 1 tablet by mouth 2 (two) times daily.   Yes Historical Provider, MD  hydrochlorothiazide (MICROZIDE) 12.5 MG capsule Take 1 capsule (12.5 mg total) by mouth daily. 05/05/15  Yes [place] N Rumley, DO  latanoprost (XALATAN) 0.005 % ophthalmic solution Place 1 drop into both eyes at bedtime.  06/21/14  Yes Historical Provider, MD  lisinopril (PRINIVIL,ZESTRIL) 20 MG tablet Take 1 tablet (20 mg total) by mouth daily. 01/18/15  Yes  N Rumley, DO  loratadine (CLARITIN) 10 MG tablet Take 10 mg by mouth daily as needed for allergies.    Yes Historical Provider, MD  Multiple Vitamins-Minerals (MULTIVITAMIN WITH MINERALS) tablet Take 1 tablet by mouth daily.   Yes Historical Provider, MD  Omega-3 Fatty Acids (FISH OIL) 1000 MG CAPS Take 1 capsule by mouth 2 (two) times daily.   Yes Historical  Provider, MD  solifenacin (VESICARE) 10 MG tablet Take 10 mg by mouth daily.   Yes Historical Provider, MD  tiZANidine (ZANAFLEX) 4 MG tablet Take 1 tablet (4 mg total) by mouth every 8 (eight) hours as needed for muscle spasms. 04/16/15  Yes Tia Alert, MD  fentaNYL (DURAGESIC - DOSED MCG/HR) 50 MCG/HR Place 1 patch (50 mcg total) onto the skin every 3 (three) days. Patient not taking: Reported on 10/25/2015 04/16/15   Tia Alert, MD  oxybutynin (DITROPAN-XL) 10 MG 24 hr tablet Take 1 tablet (10 mg total) by mouth daily. Patient not taking: Reported on 10/25/2015 05/12/13   Day[place]ys [place] Peaches, MD  sertraline (ZOLOFT) 100 MG tablet Take 1 tablet (100 mg total) by mouth daily. Patient not taking: Reported on 10/25/2015 12/01/14   Araceli Bouche, DO     Physical Exam: Filed Vitals:   10/25/15 2230 10/25/15 2300 10/25/15 2330 10/26/15 0014  BP: 128/66 131/61 136/63 127/58  Pulse: 80 80 80 80  Temp:    97.9 F (36.6 C)  TempSrc:    Oral  Resp: Height:     (1.626 m)  Weight:    65.4 kg (144 lb 2.9 oz)  SpO2: 96% 93% 96% 96%     Constitutional: Vital signs reviewed. Patient is sickly in appearance and cooperative with exam. Alert and oriented x3.  Head: Normocephalic and atraumatic  Ear: TM normal bilaterally  Mouth: no erythema or exudates, MMM  Eyes: PERRL, EOMI, conjunctivae normal, No scleral icterus.  Neck: Supple, Trachea midline normal ROM, No JVD, mass, thyromegaly, or carotid bruit present.  Cardiovascular: RRR, S1 normal, S2 normal, no MRG, pulses symmetric and intact bilaterally  Pulmonary/Chest: Decreased air movement appreciated at the bases bilaterally. Positive rales Abdominal: Soft. Non-tender, non-distended, bowel sounds are normal, no masses, organomegaly, or guarding present.  GU: no CVA tenderness Musculoskeletal: No joint deformities, erythema, or stiffness, ROM full and no nontender Ext: no edema and no cyanosis, pulses palpable  bilaterally (DP and PT)  Hematology: no cervical, inginal, or axillary adenopathy.  Neurological: A&O x3, Strenght is normal and symmetric bilaterally, cranial nerve II-XII are grossly intact, no focal motor deficit, sensory intact to light touch bilaterally.  Skin: Warm, dry and intact. No rash, cyanosis, or clubbing.  Psychiatric: Normal mood and affect. speech and behavior is normal. Judgment and thought content normal. Cognition and memory are normal.      Data Review   Micro Results No results found for this or any previous visit (from the past 240 hour(s)).  Radiology Reports Dg Chest 2 View  10/25/2015  CLINICAL DATA:  Patient told she has right-sided pneumonia at evaluation 4 days ago. EXAM: CHEST  2 VIEW COMPARISON:  None. FINDINGS: Lungs are adequately inflated with a small to moderate right pleural effusion likely with associated atelectasis in the right base. Cannot completely exclude infection in the right base. Suggestion mild emphysematous disease. Cardiomediastinal silhouette is within normal. There are mild degenerate changes of the spine. IMPRESSION: Small to moderate right pleural effusion likely with associated basilar atelectasis. Cannot exclude infection in the right base. Electronically Signed   By: Elberta Fortis M.D.   On: 10/25/2015 18:54     CBC  Recent Labs Lab 10/25/15 2004  WBC 13.7*  HGB 14.8  HCT 43.8  PLT 442*  MCV 97.8  MCH 33.0  MCHC 33.8  RDW 13.0  LYMPHSABS 1.2  MONOABS 1.3*  EOSABS 0.5  BASOSABS 0.1    Chemistries   Recent Labs Lab 10/25/15 2004  NA 136  K 3.5  CL 99*  CO2 26  GLUCOSE 115*  BUN 12  CREATININE 0.75  CALCIUM 9.4  AST 32  ALT 34  ALKPHOS 101  BILITOT 0.3   ------------------------------------------------------------------------------------------------------------------ estimated creatinine clearance is 52.5 mL/min (by C-G formula based on Cr of  0.75). ------------------------------------------------------------------------------------------------------------------ No results for input(s): HGBA1C in the last 72 hours. ------------------------------------------------------------------------------------------------------------------ No results for input(s): CHOL, HDL, LDLCALC, TRIG, CHOLHDL, LDLDIRECT in the last 72 hours. ------------------------------------------------------------------------------------------------------------------ No results for input(s): TSH, T4TOTAL, T3FREE, THYROIDAB in the last 72 hours.  Invalid input(s): FREET3 ------------------------------------------------------------------------------------------------------------------ No results for input(s): VITAMINB12, FOLATE, FERRITIN, TIBC, IRON, RETICCTPCT in the last 72 hours.  Coagulation profile No results for input(s): INR, PROTIME in the last 168 hours.  No results for input(s): DDIMER in the last 72 hours.  Cardiac Enzymes No results for input(s): CKMB, TROPONINI, MYOGLOBIN in the last 168 hours.  Invalid input(s): CK ------------------------------------------------------------------------------------------------------------------ Invalid input(s): POCBNP   CBG: No results for input(s): GLUCAP in the last 168 hours.  EKG: Independently reviewed. Sinus rhythm   Assessment/Plan Active Problems:    CAP (community acquired pneumonia) acute. Patient appears to have failed outpatient antibiotics of oral azithromycin. Presented to urgent care with continued symptoms of cough and malaise found to have a small pleural effusion which she was transferred to the ED. WBC count elevated at 13.7 and lactic acid 0.81. - Empiric antibiotic of IV Rocephin and azithromycin - DuoNeb's every 6 hours - Continuous pulse oximetry and nasal cannula oxygen as needed for shortness of breath  - CBC in am - Mucinex and Tessalon Perles as needed for congestion and  cough respectively - Patient may need a 6 minute walk prior to discharge is found to have desaturations with ambulation   Suspected parapneumonic: Effusion appears to base all on chest x-ray  - Check right decubitus x-ray in a.m. - Reassess for further management   Hypertension: Stable -Continue lisinopril and hydrochlorothiazide  Glaucoma history  -Continue eyedrops per home regimen   Hyperlipidemia -Continue atorvastatin  GERD  -Protonix as pharmacy therapeutic substitution for Nexium   Urge incontinence -Continuen enablex   Code Status:   full Family Communication: bedside Disposition Plan: admit   Total time spent 55 minutes.Greater than 50% of this time was spent in counseling, explanation of diagnosis, planning of further management, and coordination of care  Clydie BraunRondell A Billie Intriago [facility] Pager [phone]  If 7PM-7AM, please contact night-coverage [email or web address] Password [email or web address] 10/26/2015, 1:22 AM

## 2015-10-26 NOTE — Progress Notes (Signed)
Patient seen and examined, admitted by Dr. Katrinka BlazingSmith this morning. Briefly 74 year old female with hypertension, hyperlipidemia, presented with community-acquired pneumonia, failed outpatient antibiotics with oral Zithromax.  Agree with H&P, assessment and plan  Community-acquired pneumonia with right-sided small-to-moderate pleural effusion - Continue IV Rocephin, Zithromax, DuoNeb nebs, O2 supplementation - Follow sputum cultures, blood cultures, also ordered influenza panel - Decub chest x-ray showed layering right-sided pleural effusion, we will obtain US guided thoracentesis with labs    Melissa Gonzales M.D. [facility] 10/26/2015, 9:34 AM  Pager: [phone]

## 2015-10-26 NOTE — Progress Notes (Signed)
New Admission Note:  Arrival Method: via stretcher with nurse Tech Mental Orientation: Alert & oriented x4 Telemetry: n/a Assessment: Completed Skin: rash (see doc flowsheet) IV: Right forearm Pain: 4/10, see doc flowsheet Tubes: n/a Safety Measures: Safety Fall Prevention Plan was given, discussed and signed. Admission: Completed [address] Orientation: Patient has been orientated to the room, unit and the staff. Family: none at bedside  Orders have been reviewed and implemented. Will continue to monitor the patient. Call light has been placed within reach and bed alarm has been activated.   Tempie DonningMercy Orvin Netter BSN, RN  Phone Number: [phone] [facility] [place] [facility]

## 2015-10-26 NOTE — ED Provider Notes (Signed)
CSN: [id number]     Arrival date & time 10/25/15  1938 History   First MD Initiated Contact with Patient 10/25/15 2026     Chief Complaint  Patient presents with  . Pleural Effusion     (Consider location/radiation/quality/duration/timing/severity/associated sxs/prior Treatment) HPI Comments: Approximately 1 week history of cough and congestion and SOB with exertion.  Seen at urgent care and treated for pneumonia with zithromax which she finished yesterday. [facility] because still feeling poorly with productive cough.  Xray showed pleural effusion and sent to the ED.  Denies fever, chills, chest pain. Some rib pain with coughing and DOE.  Denies any heart or lung problems. No abdominal pain or vomiting.  The history is provided by the patient.    Past Medical History  Diagnosis Date  . Anxiety   . GERD (gastroesophageal reflux disease)   . Hyperlipidemia   . Hypertension   . Depression   . Glaucoma     Bilateral eyes  . Pneumonia     hx of  . Urgency of urination   . Diverticulosis   . Arthritis    Past Surgical History  Procedure Laterality Date  . Cholecystectomy  1993  . Vaginal hysterectomy  1970s    uterine prolapse  . Maximum access (mas)posterior lumbar interbody fusion (plif) 2 level N/A 11/27/2014    Procedure: LUMBAR THREE-FOUR, LUMBAR FOUR-FIVE MAXIMUM ACCESS POSTERIOR LUMBAR INTERBODY FUSION;  Surgeon: Tia Alertavid S Jones, MD;  Location: [facility];  Service: Neurosurgery;  Laterality: N/A;  L3-4 L4-5 MAXIMUM ACCESS POSTERIOR LUMBAR INTERBODY FUSION  . Eye surgery      Cataract Surgery Bilateral, 5 years ago  . Colonoscopy    . Maximum access (mas)posterior lumbar interbody fusion (plif) 1 level N/A 03/17/2015    Procedure: Lumbar five -sacral one Maximum access posterior lumbar interbody fusion/Lumbar two-three  Laminectomy and extension of instrumentation to Sacral-one;  Surgeon: Tia Alertavid S Jones, MD;  Location: [facility];  Service:  Neurosurgery;  Laterality: N/A;  . Back surgery     Family History  Problem Relation Age of Onset  . Mental illness Daughter   . Drug abuse Grandchild   . Heart disease Mother [id number]    died MI age 75  . Hyperlipidemia Sister   . Hypertension Sister   . Diabetes Neg Hx   . Cancer Father     Primary Cell Liver Cancer   Social History  Substance Use Topics  . Smoking status: Never Smoker   . Smokeless tobacco: None  . Alcohol Use: 0.6 oz/week    1 Glasses of wine per week     Comment: 1 glass per night   OB History    No data available     Review of Systems  Constitutional: Positive for activity change, appetite change and fatigue. Negative for fever.  HENT: Positive for congestion and rhinorrhea.   Respiratory: Positive for cough and shortness of breath. Negative for chest tightness.   Cardiovascular: Negative for chest pain.  Gastrointestinal: Negative for nausea, vomiting and abdominal pain.  Genitourinary: Negative for dysuria and hematuria.  Neurological: Positive for weakness. Negative for dizziness, light-headedness and headaches.      Allergies  Review of patient's allergies indicates no known allergies.  Home Medications   Prior to Admission medications   Medication Sig Start Date End Date Taking? Authorizing Provider  ALPHAGAN P 0.1 % SOLN Place 1 drop into both eyes every 12 (twelve) hours.  04/05/12  Yes  Historical Provider, MD  ALPRAZolam (XANAX) 0.25 MG tablet TAKE 1 TABLET BY MOUTH TWICE DAILY AS NEEDED FOR ANXIETY 05/05/15  Yes [place] N Rumley, DO  atorvastatin (LIPITOR) 40 MG tablet take 1 tablet by mouth once daily Patient taking differently: Take 40 mg by mouth daily at 6 PM.  06/26/14  Yes [place] N Rumley, DO  Calcium Carb-Cholecalciferol (CALCIUM 600 + D PO) Take 1 tablet by mouth 2 (two) times daily.   Yes Historical Provider, MD  dorzolamide-timolol (COSOPT) 22.3-6.8 MG/ML ophthalmic solution Place 1 drop into both eyes 2 (two) times daily.  03/20/11   Yes Historical Provider, MD  esomeprazole (NEXIUM) 40 MG capsule Take 1 capsule (40 mg total) by mouth daily before breakfast. 05/22/14  Yes [place] N Rumley, DO  glucosamine-chondroitin 500-400 MG tablet Take 1 tablet by mouth 2 (two) times daily.   Yes Historical Provider, MD  hydrochlorothiazide (MICROZIDE) 12.5 MG capsule Take 1 capsule (12.5 mg total) by mouth daily. 05/05/15  Yes [place] N Rumley, DO  latanoprost (XALATAN) 0.005 % ophthalmic solution Place 1 drop into both eyes at bedtime.  06/21/14  Yes Historical Provider, MD  lisinopril (PRINIVIL,ZESTRIL) 20 MG tablet Take 1 tablet (20 mg total) by mouth daily. 01/18/15  Yes [place] N Rumley, DO  loratadine (CLARITIN) 10 MG tablet Take 10 mg by mouth daily as needed for allergies.    Yes Historical Provider, MD  Multiple Vitamins-Minerals (MULTIVITAMIN WITH MINERALS) tablet Take 1 tablet by mouth daily.   Yes Historical Provider, MD  Omega-3 Fatty Acids (FISH OIL) 1000 MG CAPS Take 1 capsule by mouth 2 (two) times daily.   Yes Historical Provider, MD  solifenacin (VESICARE) 10 MG tablet Take 10 mg by mouth daily.   Yes Historical Provider, MD  tiZANidine (ZANAFLEX) 4 MG tablet Take 1 tablet (4 mg total) by mouth every 8 (eight) hours as needed for muscle spasms. 04/16/15  Yes Tia Alert, MD  fentaNYL (DURAGESIC - DOSED MCG/HR) 50 MCG/HR Place 1 patch (50 mcg total) onto the skin every 3 (three) days. Patient not taking: Reported on 10/25/2015 04/16/15   Tia Alert, MD  oxybutynin (DITROPAN-XL) 10 MG 24 hr tablet Take 1 tablet (10 mg total) by mouth daily. Patient not taking: Reported on 10/25/2015 05/12/13   [place] Peaches, MD  sertraline (ZOLOFT) 100 MG tablet Take 1 tablet (100 mg total) by mouth daily. Patient not taking: Reported on 10/25/2015 12/01/14    N Rumley, DO   BP 127/58 mmHg  Pulse 80  Temp(Src) 97.9 F (36.6 C) (Oral)  Resp 18  Ht  (1.626 m)  Wt 144 lb 2.9 oz (65.4 kg)  BMI 24.74 kg/m2  SpO2  96% Physical Exam  Constitutional: She is oriented to person, place, and time. She appears well-developed and well-nourished. No distress.  HENT:  Head: Normocephalic and atraumatic.  Mouth/Throat: Oropharynx is clear and moist. No oropharyngeal exudate.  Eyes: Conjunctivae and EOM are normal. Pupils are equal, round, and reactive to light.  Neck: Normal range of motion. Neck supple.  No meningismus.  Cardiovascular: Normal rate, regular rhythm, normal heart sounds and intact distal pulses.   No murmur heard. Pulmonary/Chest: Effort normal and breath sounds normal. No respiratory distress.  Decreased breath sounds at R base  Abdominal: Soft. There is no tenderness. There is no rebound and no guarding.  Musculoskeletal: Normal range of motion. She exhibits no edema or tenderness.  Neurological: She is alert and oriented to person, place, and  time. No cranial nerve deficit. She exhibits normal muscle tone. Coordination normal.  No ataxia on finger to nose bilaterally. No pronator drift. 5/5 strength throughout. CN 2-12 intact.Equal grip strength. Sensation intact.   Skin: Skin is warm.  Psychiatric: She has a normal mood and affect. Her behavior is normal.  Nursing note and vitals reviewed.   ED Course  Procedures (including critical care time) Labs Review Labs Reviewed  CBC WITH DIFFERENTIAL/PLATELET - Abnormal; Notable for the following:    WBC 13.7 (*)    Platelets 442 (*)    Neutro Abs 10.6 (*)    Monocytes Absolute 1.3 (*)    All other components within normal limits  COMPREHENSIVE METABOLIC PANEL - Abnormal; Notable for the following:    Chloride 99 (*)    Glucose, Bld 115 (*)    Albumin 3.1 (*)    All other components within normal limits  CBC - Abnormal; Notable for the following:    WBC 11.9 (*)    Platelets 402 (*)    All other components within normal limits  CULTURE, BLOOD (ROUTINE X 2)  CULTURE, BLOOD (ROUTINE X 2)  CULTURE, EXPECTORATED SPUTUM-ASSESSMENT  GRAM  STAIN  STREP PNEUMONIAE URINARY ANTIGEN  HIV ANTIBODY (ROUTINE TESTING)  LEGIONELLA ANTIGEN, URINE  I-STAT CG4 LACTIC ACID, ED  I-STAT CG4 LACTIC ACID, ED    Imaging Review Dg Chest 2 View  10/25/2015  CLINICAL DATA:  Patient told she has right-sided pneumonia at evaluation 4 days ago. EXAM: CHEST  2 VIEW COMPARISON:  None. FINDINGS: Lungs are adequately inflated with a small to moderate right pleural effusion likely with associated atelectasis in the right base. Cannot completely exclude infection in the right base. Suggestion mild emphysematous disease. Cardiomediastinal silhouette is within normal. There are mild degenerate changes of the spine. IMPRESSION: Small to moderate right pleural effusion likely with associated basilar atelectasis. Cannot exclude infection in the right base. Electronically Signed   By: Elberta Fortis M.D.   On: 10/25/2015 18:54   I have personally reviewed and evaluated these images and lab results as part of my medical decision-making.   EKG Interpretation   Date/Time:  Monday October 25 2015 20:59:01 EST Ventricular Rate:  82 PR Interval:  164 QRS Duration: 91 QT Interval:  388 QTC Calculation: 453 R Axis:   36 Text Interpretation:  Sinus rhythm Consider left atrial enlargement  Inferior infarct, old Baseline wander in lead(s) V4 V5 V6 No significant  change was found Confirmed by Manus Gunning  MD, Ritaj Dullea [phone]) on 10/25/2015  9:19:49 PM      MDM   Final diagnoses:  CAP (community acquired pneumonia)  Parapneumonic effusion   Recent diagnosis of CAP, now with pleural effusion.  Likely parapneumonic.  No hypoxia at rest, no distress. Saturations to 80s with ambulation.  Appears to have failed outpatient antibiotics.  Will admit for IV antibiotics and further workup of effusion.  No indication for emergent thoracentesis.  D/w Dr. Katrinka Blazing.  Glynn Octave, MD 10/26/15 [phone]

## 2015-10-27 ENCOUNTER — Inpatient Hospital Stay (HOSPITAL_COMMUNITY): Payer: Medicare Other

## 2015-10-27 DIAGNOSIS — J948 Other specified pleural conditions: Secondary | ICD-10-CM

## 2015-10-27 DIAGNOSIS — N3941 Urge incontinence: Secondary | ICD-10-CM

## 2015-10-27 DIAGNOSIS — I10 Essential (primary) hypertension: Secondary | ICD-10-CM

## 2015-10-27 DIAGNOSIS — E7800 Pure hypercholesterolemia, unspecified: Secondary | ICD-10-CM

## 2015-10-27 DIAGNOSIS — J189 Pneumonia, unspecified organism: Principal | ICD-10-CM

## 2015-10-27 DIAGNOSIS — F329 Major depressive disorder, single episode, unspecified: Secondary | ICD-10-CM

## 2015-10-27 DIAGNOSIS — F411 Generalized anxiety disorder: Secondary | ICD-10-CM

## 2015-10-27 LAB — LEGIONELLA ANTIGEN, URINE

## 2015-10-27 LAB — CBC
HCT: 44.9 % (ref 36.0–46.0)
HEMOGLOBIN: 14.9 g/dL (ref 12.0–15.0)
MCH: 32.7 pg (ref 26.0–34.0)
MCHC: 33.2 g/dL (ref 30.0–36.0)
MCV: 98.5 fL (ref 78.0–100.0)
Platelets: 498 10*3/uL — ABNORMAL HIGH (ref 150–400)
RBC: 4.56 MIL/uL (ref 3.87–5.11)
RDW: 13.1 % (ref 11.5–15.5)
WBC: 11.1 10*3/uL — ABNORMAL HIGH (ref 4.0–10.5)

## 2015-10-27 LAB — BASIC METABOLIC PANEL
Anion gap: 12 (ref 5–15)
BUN: 9 mg/dL (ref 6–20)
CHLORIDE: 100 mmol/L — AB (ref 101–111)
CO2: 27 mmol/L (ref 22–32)
CREATININE: 0.57 mg/dL (ref 0.44–1.00)
Calcium: 9.2 mg/dL (ref 8.9–10.3)
GFR calc Af Amer: >60 mL/min (ref >60)
GFR calc non Af Amer: >60 mL/min (ref >60)
GLUCOSE: 163 mg/dL — AB (ref 65–99)
POTASSIUM: 3.6 mmol/L (ref 3.5–5.1)
SODIUM: 139 mmol/L (ref 135–145)

## 2015-10-27 LAB — GRAM STAIN

## 2015-10-27 LAB — PROTEIN, BODY FLUID: Total protein, fluid: 4.3 g/dL

## 2015-10-27 LAB — LACTATE DEHYDROGENASE, PLEURAL OR PERITONEAL FLUID: LD, Fluid: 867 U/L — ABNORMAL HIGH (ref 3–23)

## 2015-10-27 LAB — GLUCOSE, SEROUS FLUID: Glucose, Fluid: 130 mg/dL

## 2015-10-27 LAB — LACTATE DEHYDROGENASE: LDH: 210 U/L — AB (ref 98–192)

## 2015-10-27 MED ORDER — LIDOCAINE HCL (PF) 1 % IJ SOLN
INTRAMUSCULAR | Status: AC
  Filled 2015-10-27: qty 10

## 2015-10-27 MED ORDER — IPRATROPIUM-ALBUTEROL 0.5-2.5 (3) MG/3ML IN SOLN
3.0000 mL | Freq: Two times a day (BID) | RESPIRATORY_TRACT | Status: DC
  Administered 2015-10-28 – 2015-10-29 (×2): 3 mL via RESPIRATORY_TRACT
  Filled 2015-10-27 (×3): qty 3

## 2015-10-27 NOTE — Procedures (Signed)
Successful US guided right thoracentesis. Yielded 550mL of hazy, blood tinged fluid. Pt tolerated procedure well. No immediate complications.  Specimen was sent for labs. CXR ordered.  Brayton ElBRUNING, Linsie Lupo PA-C 10/27/2015 9:59 AM

## 2015-10-27 NOTE — Care Management Note (Signed)
Case Management Note  Patient Details  Name: Melissa Gonzales MRN: [id number] Date of Birth: [birth year]  Subjective/Objective:         CM following for progression and d/c planning.             Action/Plan: Met with pt , no d/c needs identified, pt has agreed to be a part of the Pneumonia Study, EMMI.  Expected Discharge Date:   10/28/2015               Expected Discharge Plan:  Home/Self Care  In-House Referral:  NA  Discharge planning Services  CM Consult  Post Acute Care Choice:  NA Choice offered to:  NA  DME Arranged:    DME Agency:     HH Arranged:    HH Agency:     Status of Service:  Completed, signed off  Medicare Important Message Given:    Date Medicare IM Given:    Medicare IM give by:    Date Additional Medicare IM Given:    Additional Medicare Important Message give by:     If discussed at [place] of Stay Meetings, dates discussed:    Additional Comments:  Adron Bene, RN 10/27/2015, 11:38 AM

## 2015-10-27 NOTE — Progress Notes (Signed)
[facility] Progress Note    Patient: Melissa Gonzales    ZOX:[id number]  DOB: Oct 07, 1941     DOA: 10/25/2015 Date of Service: the patient was seen and examined on 10/27/2015  Subjective:  Patient was seen after thoracentesis and was complaining of pain worsening. Denies having increasing shortness of breath or fever or chills or dizziness or lightheadedness. Vitals remained stable after the procedure. Nutrition:  Able to tolerate oral diet Activity:  Ambulating in the room Last BM:  10/27/2015  Assessment and Plan: 1. Community acquired pneumonia  presents with worsening cough and weakness. Treated with Z-Pak as an outpatient. Did not improve. X-ray showed pleural effusion. Thoracentesis is suggestive of parapneumonic effusion.  Currently we will continue with broad-spectrum antibiotics.  continue with pain medication.  postprocedure x-ray does not show any pneumothorax. Monitor closely.  2. Essential hypertension. Continuing home medication.  3.  Anxiety. Continuing Xanax.  4.  Chronic pain syndrome.  patient was on fentanyl patch,   currently we will continue oxycodone for acute pain.  Continue Zanaflex.  5.  Mood disorder. Will resume home medications.  DVT Prophylaxis: subcutaneous Heparin Nutrition:  Regular diet Advance goals of care discussion:  Full code  Brief Summary of Hospitalization:  HPI: As per the H and P dictated on admission, "Patient has a past medical history significant for HTN, HLD, depression, anxiety, and glaucoma; who presents after recently being diagnosed with pneumonia with continued complaints of weakness and cough. Symptoms initially brought her to urgent care approximately 5 days ago. She was evaluated and was given a Z-Pak for the possibility of a community-acquired pneumonia. Patient reports taking the Z-Pak as advised however symptoms did not improve. She continues to have this productive cough with yellowish sputum production and feel  short of breath. She has been taking Mucinex in addition to the antibiotics without any relief of symptoms. She endorses associated symptoms of nasal congestion and postnasal drip. Symptoms worsened with activity. Denies feeling short of breath at rest. Denies any fever, chills, diarrhea, abdominal pain, nausea, or vomiting. Patient knows that she just wasn't feeling better and was advised to return if symptoms did not improve. She went to urgent care today and a chest x-ray revealed pleural effusions. She was transferred to the emergency department for further evaluation. Patient was seen to have decreased oxygen saturations with ambulation to 87% on room air. Initial lab work showed a elevated WBC count of 13.7." Daily update:  Admitted on 10/26/2015, status post thoracentesis on 10/27/2015. Consultants:  none Procedures:  thoracentesis Antibiotics: Anti-infectives    Start     Dose/Rate Route Frequency Ordered Stop   10/26/15 2300  azithromycin (ZITHROMAX) 500 mg in dextrose 5 % 250 mL IVPB     500 mg 250 mL/hr over 60 Minutes Intravenous Every 24 hours 10/26/15 0014 11/02/15 2259   10/26/15 2200  cefTRIAXone (ROCEPHIN) 1 g in dextrose 5 % 50 mL IVPB     1 g 100 mL/hr over 30 Minutes Intravenous Every 24 hours 10/26/15 0014 11/02/15 2159   10/25/15 2245  cefTRIAXone (ROCEPHIN) 1 g in dextrose 5 % 50 mL IVPB     1 g 100 mL/hr over 30 Minutes Intravenous  Once 10/25/15 2238 10/25/15 2336   10/25/15 2245  azithromycin (ZITHROMAX) 500 mg in dextrose 5 % 250 mL IVPB     500 mg 250 mL/hr over 60 Minutes Intravenous  Once 10/25/15 2238 10/26/15 0017       Family Communication:  No family  was present at bedside, at the time of interview.   Disposition:  Expected discharge date: 10/28/2015 Barriers to safe discharge:  Improvement in symptoms and oxygenation   Intake/Output Summary (Last 24 hours) at 10/27/15 1519 Last data filed at 10/27/15 0920  Gross per 24 hour  Intake    780 ml  Output    1525 ml  Net   -745 ml   Filed Weights   10/25/15 1949 10/26/15 0014 10/26/15 2129  Weight: 64.864 kg (143 lb) 65.4 kg (144 lb 2.9 oz) 65.9 kg (145 lb 4.5 oz)    Objective: Physical Exam: Filed Vitals:   10/27/15 0700 10/27/15 0841 10/27/15 0931 10/27/15 0943  BP:  112/57 128/57 117/54  Pulse:  73    Temp:  97.7 F (36.5 C)    TempSrc:  Oral    Resp:  18    Height:      Weight:      SpO2: 96% 98%       General: Appear in  moderate distress, no Rash; Oral Mucosa moist. Cardiovascular: S1 and S2 Present, no Murmur, no JVD Respiratory: Bilateral Air entry present and  Right-sided Crackles, no wheezes Abdomen: Bowel Sound present, Soft and no tenderness Extremities: no Pedal edema, no calf tenderness Neurology: Grossly no focal neuro deficit.  Data Reviewed: CBC:  Recent Labs Lab 10/25/15 2004 10/26/15 0055 10/27/15 0906  WBC 13.7* 11.9* 11.1*  NEUTROABS 10.6*  --   --   HGB 14.8 13.5 14.9  HCT 43.8 40.0 44.9  MCV 97.8 97.6 98.5  PLT 442* 402* 498*   Basic Metabolic Panel:  Recent Labs Lab 10/25/15 2004 10/27/15 0906  NA 136 139  K 3.5 3.6  CL 99* 100*  CO2 26 27  GLUCOSE 115* 163*  BUN 12 9  CREATININE 0.75 0.57  CALCIUM 9.4 9.2   Liver Function Tests:  Recent Labs Lab 10/25/15 2004  AST 32  ALT 34  ALKPHOS 101  BILITOT 0.3  PROT 7.4  ALBUMIN 3.1*   No results for input(s): LIPASE, AMYLASE in the last 168 hours. No results for input(s): AMMONIA in the last 168 hours.  Cardiac Enzymes: No results for input(s): CKTOTAL, CKMB, CKMBINDEX, TROPONINI in the last 168 hours. BNP (last 3 results) No results for input(s): BNP in the last 8760 hours.  ProBNP (last 3 results) No results for input(s): PROBNP in the last 8760 hours.   CBG: No results for input(s): GLUCAP in the last 168 hours.  Recent Results (from the past 240 hour(s))  Blood culture (routine x 2)     Status: None (Preliminary result)   Collection Time: 10/25/15  9:08 PM    Result Value Ref Range Status   Specimen Description BLOOD LEFT ANTECUBITAL  Final   Special Requests BOTTLES DRAWN AEROBIC AND ANAEROBIC 5CC   Final   Culture NO GROWTH 2 DAYS  Final   Report Status PENDING  Incomplete  Blood culture (routine x 2)     Status: None (Preliminary result)   Collection Time: 10/25/15  9:10 PM  Result Value Ref Range Status   Specimen Description BLOOD RIGHT ANTECUBITAL  Final   Special Requests BOTTLES DRAWN AEROBIC AND ANAEROBIC 5CC  Final   Culture NO GROWTH 2 DAYS  Final   Report Status PENDING  Incomplete  Culture, body fluid-bottle     Status: None (Preliminary result)   Collection Time: 10/27/15  9:32 AM  Result Value Ref Range Status   Specimen Description FLUID RIGHT PLEURAL  Final   Special Requests BOTTLES DRAWN AEROBIC AND ANAEROBIC  Final   Culture PENDING  Incomplete   Report Status PENDING  Incomplete  Gram stain     Status: None   Collection Time: 10/27/15  9:32 AM  Result Value Ref Range Status   Specimen Description FLUID RIGHT PLEURAL  Final   Special Requests NONE  Final   Gram Stain   Final    FEW WBC PRESENT,BOTH PMN AND MONONUCLEAR NO ORGANISMS SEEN    Report Status 10/27/2015 FINAL  Final     Studies: Dg Chest 1 View  10/27/2015  CLINICAL DATA:  Status post right-sided thoracentesis EXAM: CHEST 1 VIEW COMPARISON:  October 25, 2015 and October 26, 2015 FINDINGS: Right pleural effusion is smaller status post thoracentesis. Small amount of right effusion remains with right base atelectasis. There is no demonstrable pneumothorax. Left lung is clear. Heart size and pulmonary vascularity are normal. No adenopathy. IMPRESSION: No pneumothorax. Right pleural effusion smaller following thoracentesis. Small right effusion with right base atelectasis remains. Left lung clear. No change in cardiac silhouette. Electronically Signed   By: Bretta Bang III M.D.   On: 10/27/2015 10:07   US Thoracentesis Asp Pleural Space W/img  Guide  10/27/2015  CLINICAL DATA:  Pneumonia, right-sided pleural effusion. Request for diagnostic and therapeutic thoracentesis EXAM: ULTRASOUND GUIDED RIGHT THORACENTESIS COMPARISON:  None. PROCEDURE: An ultrasound guided thoracentesis was thoroughly discussed with the patient and questions answered. The benefits, risks, alternatives and complications were also discussed. The patient understands and wishes to proceed with the procedure. Written consent was obtained. Ultrasound was performed to localize and mark an adequate pocket of fluid in the right chest. The area was then prepped and draped in the normal sterile fashion. 1% Lidocaine was used for local anesthesia. Under ultrasound guidance a Safe-T-Centesis catheter was introduced. Thoracentesis was performed. The catheter was removed and a dressing applied. COMPLICATIONS: None immediate. FINDINGS: A total of approximately 550 mL of hazy, blood-tinged fluid was removed. A fluid sample wassent for laboratory analysis. IMPRESSION: Successful ultrasound guided right thoracentesis yielding 550 mL of pleural fluid. Read by: Brayton El PA-C Electronically Signed   By: Corlis Leak M.D.   On: 10/27/2015 10:04     Scheduled Meds: . atorvastatin  40 mg Oral q1800  . azithromycin  500 mg Intravenous Q24H  . brimonidine  1 drop Both Eyes Q12H  . cefTRIAXone (ROCEPHIN)  IV  1 g Intravenous Q24H  . darifenacin  15 mg Oral Daily  . dorzolamide  1 drop Both Eyes BID   And  . timolol  1 drop Both Eyes BID  . enoxaparin (LOVENOX) injection  40 mg Subcutaneous Q24H  . guaiFENesin  600 mg Oral BID  . hydrochlorothiazide  12.5 mg Oral Daily  . ipratropium-albuterol  3 mL Nebulization Q6H  . latanoprost  1 drop Both Eyes QHS  . lidocaine (PF)      . lisinopril  20 mg Oral Daily  . multivitamin with minerals  1 tablet Oral Daily  . omega-3 acid ethyl esters  1 g Oral Daily  . pantoprazole  40 mg Oral Daily   Continuous Infusions:  PRN Meds: ALPRAZolam,  benzonatate, ipratropium-albuterol, loratadine, morphine injection, oxyCODONE, tiZANidine  Time spent: 30 minutes  Author: Lynden Oxford, MD [facility] Pager: [phone] 10/27/2015 3:19 PM  If 7PM-7AM, please contact night-coverage at [email or web address], password [facility]

## 2015-10-28 LAB — CBC
HEMATOCRIT: 39.5 % (ref 36.0–46.0)
Hemoglobin: 13.3 g/dL (ref 12.0–15.0)
MCH: 32.4 pg (ref 26.0–34.0)
MCHC: 33.7 g/dL (ref 30.0–36.0)
MCV: 96.3 fL (ref 78.0–100.0)
Platelets: 393 10*3/uL (ref 150–400)
RBC: 4.1 MIL/uL (ref 3.87–5.11)
RDW: 13.1 % (ref 11.5–15.5)
WBC: 10.7 10*3/uL — ABNORMAL HIGH (ref 4.0–10.5)

## 2015-10-28 LAB — COMPREHENSIVE METABOLIC PANEL
ALBUMIN: 2.3 g/dL — AB (ref 3.5–5.0)
ALT: 37 U/L (ref 14–54)
ANION GAP: 9 (ref 5–15)
AST: 44 U/L — ABNORMAL HIGH (ref 15–41)
Alkaline Phosphatase: 106 U/L (ref 38–126)
BILIRUBIN TOTAL: 0.6 mg/dL (ref 0.3–1.2)
BUN: 7 mg/dL (ref 6–20)
CALCIUM: 8.6 mg/dL — AB (ref 8.9–10.3)
CO2: 26 mmol/L (ref 22–32)
Chloride: 98 mmol/L — ABNORMAL LOW (ref 101–111)
Creatinine, Ser: 0.54 mg/dL (ref 0.44–1.00)
GFR calc non Af Amer: >60 mL/min (ref >60)
GLUCOSE: 105 mg/dL — AB (ref 65–99)
POTASSIUM: 3.8 mmol/L (ref 3.5–5.1)
Sodium: 133 mmol/L — ABNORMAL LOW (ref 135–145)
TOTAL PROTEIN: 6.3 g/dL — AB (ref 6.5–8.1)

## 2015-10-28 LAB — PH, BODY FLUID: PH, BODY FLUID: 7.8

## 2015-10-28 LAB — BODY FLUID CELL COUNT WITH DIFFERENTIAL
Eos, Fluid: 4 %
Lymphs, Fluid: 30 %
Monocyte-Macrophage-Serous Fluid: 16 % — ABNORMAL LOW (ref 50–90)
NEUTROPHIL FLUID: 50 % — AB (ref 0–25)
WBC FLUID: 3754 uL — AB (ref 0–1000)

## 2015-10-28 LAB — PATHOLOGIST SMEAR REVIEW

## 2015-10-28 LAB — PROTIME-INR
INR: 1.19 (ref 0.00–1.49)
PROTHROMBIN TIME: 15.3 s — AB (ref 11.6–15.2)

## 2015-10-28 MED ORDER — OXYCODONE HCL 5 MG PO TABS
5.0000 mg | ORAL_TABLET | Freq: Four times a day (QID) | ORAL | Status: DC | PRN
  Administered 2015-10-29 (×2): 5 mg via ORAL
  Filled 2015-10-28 (×3): qty 1

## 2015-10-28 MED ORDER — LIDOCAINE 5 % EX PTCH
1.0000 | MEDICATED_PATCH | CUTANEOUS | Status: DC
  Administered 2015-10-28 – 2015-10-29 (×2): 1 via TRANSDERMAL
  Filled 2015-10-28 (×2): qty 1

## 2015-10-28 NOTE — Care Management Important Message (Signed)
Important Message  Patient Details  Name: Melissa LivingsCarol A Gonzales MRN: [id number] Date of Birth: 1941/10/31   Medicare Important Message Given:  Yes    Otila Starn P Geneal Huebert 10/28/2015, 1:55 PM

## 2015-10-28 NOTE — Progress Notes (Signed)
[facility] Progress Note    Patient: Melissa Gonzales    WUJ:[id number]  DOB: Sep 17, 1941     DOA: 10/25/2015 Date of Service: the patient was seen and examined on 10/28/2015  Subjective:  Continues to have some pain but no fever or chills cough is improving Nutrition:  Able to tolerate oral diet Activity:  Ambulating in the room Last BM:  10/27/2015  Assessment and Plan: 1. Community acquired pneumonia  presents with worsening cough and weakness. Treated with Z-Pak as an outpatient. Did not improve. X-ray showed pleural effusion. Thoracentesis is suggestive of parapneumonic effusion. Cultures showing diphtheroids which is more likely contaminant  continue with pain medication. Monitor closely.  2. Essential hypertension. Continuing home medication.  3.  Anxiety. Continuing Xanax.  4.  Chronic pain syndrome.  currently we will continue oxycodone for acute pain.  Continue Zanaflex.  5.  Mood disorder. Continue home medications.  DVT Prophylaxis: subcutaneous Heparin Nutrition:  Regular diet Advance goals of care discussion:  Full code  Brief Summary of Hospitalization:  HPI: As per the H and P dictated on admission, "Patient has a past medical history significant for HTN, HLD, depression, anxiety, and glaucoma; who presents after recently being diagnosed with pneumonia with continued complaints of weakness and cough. Symptoms initially brought her to urgent care approximately 5 days ago. She was evaluated and was given a Z-Pak for the possibility of a community-acquired pneumonia. Patient reports taking the Z-Pak as advised however symptoms did not improve. She continues to have this productive cough with yellowish sputum production and feel short of breath. She has been taking Mucinex in addition to the antibiotics without any relief of symptoms. She endorses associated symptoms of nasal congestion and postnasal drip. Symptoms worsened with activity. Denies feeling short of  breath at rest. Denies any fever, chills, diarrhea, abdominal pain, nausea, or vomiting. Patient knows that she just wasn't feeling better and was advised to return if symptoms did not improve. She went to urgent care today and a chest x-ray revealed pleural effusions. She was transferred to the emergency department for further evaluation. Patient was seen to have decreased oxygen saturations with ambulation to 87% on room air. Initial lab work showed a elevated WBC count of 13.7." Daily update:  Admitted on 10/26/2015, status post thoracentesis on 10/27/2015. Consultants:  none Procedures:  thoracentesis Antibiotics: Anti-infectives    Start     Dose/Rate Route Frequency Ordered Stop   10/26/15 2300  azithromycin (ZITHROMAX) 500 mg in dextrose 5 % 250 mL IVPB     500 mg 250 mL/hr over 60 Minutes Intravenous Every 24 hours 10/26/15 0014 11/02/15 2259   10/26/15 2200  cefTRIAXone (ROCEPHIN) 1 g in dextrose 5 % 50 mL IVPB     1 g 100 mL/hr over 30 Minutes Intravenous Every 24 hours 10/26/15 0014 11/02/15 2159   10/25/15 2245  cefTRIAXone (ROCEPHIN) 1 g in dextrose 5 % 50 mL IVPB     1 g 100 mL/hr over 30 Minutes Intravenous  Once 10/25/15 2238 10/25/15 2336   10/25/15 2245  azithromycin (ZITHROMAX) 500 mg in dextrose 5 % 250 mL IVPB     500 mg 250 mL/hr over 60 Minutes Intravenous  Once 10/25/15 2238 10/26/15 0017      Family Communication:  No family was present at bedside, at the time of interview.   Disposition:  Expected discharge date: 10/29/2015 Barriers to safe discharge:  Improvement in symptoms and oxygenation   Intake/Output Summary (Last 24 hours) at  10/28/15 1847 Last data filed at 10/28/15 1809  Gross per 24 hour  Intake   1340 ml  Output   2675 ml  Net  -1335 ml   Filed Weights   10/26/15 0014 10/26/15 2129 10/27/15 2002  Weight: 65.4 kg (144 lb 2.9 oz) 65.9 kg (145 lb 4.5 oz) 64.411 kg (142 lb)    Objective: Physical Exam: Filed Vitals:   10/28/15 0859 10/28/15  0931 10/28/15 1040 10/28/15 1641  BP: 98/55  110/92 123/55  Pulse: 74   74  Temp: 98.1 F (36.7 C)   98 F (36.7 C)  TempSrc: Oral   Oral  Resp: 17   17  Height:      Weight:      SpO2: 95% 94%  95%    General: Appear in  moderate distress, no Rash; Oral Mucosa moist. Cardiovascular: S1 and S2 Present, no Murmur, no JVD Respiratory: Bilateral Air entry present and  Right-sided Crackles, no wheezes Abdomen: Bowel Sound present, Soft and no tenderness  Data Reviewed: CBC:  Recent Labs Lab 10/25/15 2004 10/26/15 0055 10/27/15 0906 10/28/15 0525  WBC 13.7* 11.9* 11.1* 10.7*  NEUTROABS 10.6*  --   --   --   HGB 14.8 13.5 14.9 13.3  HCT 43.8 40.0 44.9 39.5  MCV 97.8 97.6 98.5 96.3  PLT 442* 402* 498* 393   Basic Metabolic Panel:  Recent Labs Lab 10/25/15 2004 10/27/15 0906 10/28/15 0525  NA 136 139 133*  K 3.5 3.6 3.8  CL 99* 100* 98*  CO2 GLUCOSE 115* 163* 105*  BUN CREATININE 0.75 0.57 0.54  CALCIUM 9.4 9.2 8.6*   Liver Function Tests:  Recent Labs Lab 10/25/15 2004 10/28/15 0525  AST 32 44*  ALT 34 37  ALKPHOS 101 106  BILITOT 0.3 0.6  PROT 7.4 6.3*  ALBUMIN 3.1* 2.3*   No results for input(s): LIPASE, AMYLASE in the last 168 hours. No results for input(s): AMMONIA in the last 168 hours.  Cardiac Enzymes: No results for input(s): CKTOTAL, CKMB, CKMBINDEX, TROPONINI in the last 168 hours. BNP (last 3 results) No results for input(s): BNP in the last 8760 hours.  ProBNP (last 3 results) No results for input(s): PROBNP in the last 8760 hours.   CBG: No results for input(s): GLUCAP in the last 168 hours.  Recent Results (from the past 240 hour(s))  Blood culture (routine x 2)     Status: None (Preliminary result)   Collection Time: 10/25/15  9:08 PM  Result Value Ref Range Status   Specimen Description BLOOD LEFT ANTECUBITAL  Final   Special Requests BOTTLES DRAWN AEROBIC AND ANAEROBIC 5CC   Final   Culture NO GROWTH 3 DAYS   Final   Report Status PENDING  Incomplete  Blood culture (routine x 2)     Status: None (Preliminary result)   Collection Time: 10/25/15  9:10 PM  Result Value Ref Range Status   Specimen Description BLOOD RIGHT ANTECUBITAL  Final   Special Requests BOTTLES DRAWN AEROBIC AND ANAEROBIC 5CC  Final   Culture NO GROWTH 3 DAYS  Final   Report Status PENDING  Incomplete  Culture, body fluid-bottle     Status: None (Preliminary result)   Collection Time: 10/27/15  9:32 AM  Result Value Ref Range Status   Specimen Description FLUID RIGHT PLEURAL  Final   Special Requests BOTTLES DRAWN AEROBIC AND ANAEROBIC  Final   Culture NO GROWTH 1 DAY  Final   Report Status PENDING  Incomplete  Gram stain     Status: None   Collection Time: 10/27/15  9:32 AM  Result Value Ref Range Status   Specimen Description FLUID RIGHT PLEURAL  Final   Special Requests NONE  Final   Gram Stain   Final    FEW WBC PRESENT,BOTH PMN AND MONONUCLEAR NO ORGANISMS SEEN    Report Status 10/27/2015 FINAL  Final     Studies: No results found.   Scheduled Meds: . atorvastatin  40 mg Oral q1800  . azithromycin  500 mg Intravenous Q24H  . brimonidine  1 drop Both Eyes Q12H  . cefTRIAXone (ROCEPHIN)  IV  1 g Intravenous Q24H  . darifenacin  15 mg Oral Daily  . dorzolamide  1 drop Both Eyes BID   And  . timolol  1 drop Both Eyes BID  . enoxaparin (LOVENOX) injection  40 mg Subcutaneous Q24H  . guaiFENesin  600 mg Oral BID  . hydrochlorothiazide  12.5 mg Oral Daily  . ipratropium-albuterol  3 mL Nebulization BID  . latanoprost  1 drop Both Eyes QHS  . lidocaine  1 patch Transdermal Q24H  . lisinopril  20 mg Oral Daily  . multivitamin with minerals  1 tablet Oral Daily  . omega-3 acid ethyl esters  1 g Oral Daily  . pantoprazole  40 mg Oral Daily   Continuous Infusions:  PRN Meds: ALPRAZolam, benzonatate, ipratropium-albuterol, loratadine, oxyCODONE, tiZANidine  Time spent: 30 minutes  Author: Lynden OxfordPranav  Ona Roehrs, MD [facility] Pager: [phone] 10/28/2015 6:47 PM  If 7PM-7AM, please contact night-coverage at [email or web address], password [email or web address]

## 2015-10-29 ENCOUNTER — Inpatient Hospital Stay (HOSPITAL_COMMUNITY): Payer: Medicare Other

## 2015-10-29 LAB — CBC
HEMATOCRIT: 38.4 % (ref 36.0–46.0)
HEMOGLOBIN: 12.6 g/dL (ref 12.0–15.0)
MCH: 31.8 pg (ref 26.0–34.0)
MCHC: 32.8 g/dL (ref 30.0–36.0)
MCV: 97 fL (ref 78.0–100.0)
Platelets: 368 10*3/uL (ref 150–400)
RBC: 3.96 MIL/uL (ref 3.87–5.11)
RDW: 12.9 % (ref 11.5–15.5)
WBC: 9.1 10*3/uL (ref 4.0–10.5)

## 2015-10-29 LAB — BASIC METABOLIC PANEL
ANION GAP: 10 (ref 5–15)
BUN: 8 mg/dL (ref 6–20)
CALCIUM: 8.7 mg/dL — AB (ref 8.9–10.3)
CO2: 27 mmol/L (ref 22–32)
Chloride: 97 mmol/L — ABNORMAL LOW (ref 101–111)
Creatinine, Ser: 0.53 mg/dL (ref 0.44–1.00)
GFR calc Af Amer: >60 mL/min (ref >60)
GLUCOSE: 105 mg/dL — AB (ref 65–99)
POTASSIUM: 3.6 mmol/L (ref 3.5–5.1)
SODIUM: 134 mmol/L — AB (ref 135–145)

## 2015-10-29 MED ORDER — BENZONATATE 200 MG PO CAPS: 200.0000 mg | ORAL_CAPSULE | Freq: Three times a day (TID) | ORAL | Status: DC | PRN

## 2015-10-29 MED ORDER — CEFPODOXIME PROXETIL 200 MG PO TABS
200.0000 mg | ORAL_TABLET | Freq: Two times a day (BID) | ORAL | Status: DC
  Administered 2015-10-29: 200 mg via ORAL
  Filled 2015-10-29 (×2): qty 1

## 2015-10-29 MED ORDER — AZITHROMYCIN 500 MG PO TABS: 500.0000 mg | ORAL_TABLET | Freq: Every day | ORAL | Status: DC

## 2015-10-29 MED ORDER — OXYCODONE HCL 5 MG PO TABS: 5.0000 mg | ORAL_TABLET | Freq: Four times a day (QID) | ORAL | Status: DC | PRN

## 2015-10-29 MED ORDER — AZITHROMYCIN 500 MG PO TABS
500.0000 mg | ORAL_TABLET | Freq: Every day | ORAL | Status: DC
Start: 2015-10-29 — End: 2015-10-29
  Administered 2015-10-29: 500 mg via ORAL
  Filled 2015-10-29: qty 1

## 2015-10-29 MED ORDER — CEFPODOXIME PROXETIL 200 MG PO TABS: 200.0000 mg | ORAL_TABLET | Freq: Two times a day (BID) | ORAL | Status: DC

## 2015-10-29 MED ORDER — LIDOCAINE 5 % EX PTCH: 1.0000 | MEDICATED_PATCH | CUTANEOUS | Status: DC

## 2015-10-29 NOTE — Progress Notes (Signed)
SATURATION QUALIFICATIONS: (This note is used to comply with regulatory documentation for home oxygen)  Patient Saturations on Room Air at Rest = 97%  Patient Saturations on Room Air while Ambulating = 93%  Please briefly explain why patient needs home oxygen: No need for home 02.

## 2015-10-29 NOTE — Progress Notes (Signed)
Melissa Gonzales to be D/C'd Home per MD order.  Discussed prescriptions and follow up appointments with the patient. Prescriptions given to patient, medication list explained in detail. Pt verbalized understanding.    Medication List    TAKE these medications        ALPHAGAN P 0.1 % Soln  Generic drug:  brimonidine  Place 1 drop into both eyes every 12 (twelve) hours.     ALPRAZolam 0.25 MG tablet  Commonly known as:  XANAX  TAKE 1 TABLET BY MOUTH TWICE DAILY AS NEEDED FOR ANXIETY     atorvastatin 40 MG tablet  Commonly known as:  LIPITOR  take 1 tablet by mouth once daily     azithromycin 500 MG tablet  Commonly known as:  ZITHROMAX  Take 1 tablet (500 mg total) by mouth daily.     benzonatate 200 MG capsule  Commonly known as:  TESSALON  Take 1 capsule (200 mg total) by mouth 3 (three) times daily as needed for cough.     CALCIUM 600 + D PO  Take 1 tablet by mouth 2 (two) times daily.     cefpodoxime 200 MG tablet  Commonly known as:  VANTIN  Take 1 tablet (200 mg total) by mouth every 12 (twelve) hours.     dorzolamide-timolol 22.3-6.8 MG/ML ophthalmic solution  Commonly known as:  COSOPT  Place 1 drop into both eyes 2 (two) times daily.     esomeprazole 40 MG capsule  Commonly known as:  NEXIUM  Take 1 capsule (40 mg total) by mouth daily before breakfast.     Fish Oil 1000 MG Caps  Take 1 capsule by mouth 2 (two) times daily.     glucosamine-chondroitin 500-400 MG tablet  Take 1 tablet by mouth 2 (two) times daily.     hydrochlorothiazide 12.5 MG capsule  Commonly known as:  MICROZIDE  Take 1 capsule (12.5 mg total) by mouth daily.     latanoprost 0.005 % ophthalmic solution  Commonly known as:  XALATAN  Place 1 drop into both eyes at bedtime.     lidocaine 5 %  Commonly known as:  LIDODERM  Place 1 patch onto the skin daily. Remove & Discard patch within 12 hours or as directed by MD     lisinopril 20 MG tablet  Commonly known as:  PRINIVIL,ZESTRIL   Take 1 tablet (20 mg total) by mouth daily.     loratadine 10 MG tablet  Commonly known as:  CLARITIN  Take 10 mg by mouth daily as needed for allergies.     multivitamin with minerals tablet  Take 1 tablet by mouth daily.     oxybutynin 10 MG 24 hr tablet  Commonly known as:  DITROPAN-XL  Take 1 tablet (10 mg total) by mouth daily.     oxyCODONE 5 MG immediate release tablet  Commonly known as:  Oxy IR/ROXICODONE  Take 1 tablet (5 mg total) by mouth every 6 (six) hours as needed for severe pain.     solifenacin 10 MG tablet  Commonly known as:  VESICARE  Take 10 mg by mouth daily.     tiZANidine 4 MG tablet  Commonly known as:  ZANAFLEX  Take 1 tablet (4 mg total) by mouth every 8 (eight) hours as needed for muscle spasms.        Filed Vitals:   10/29/15 0728 10/29/15 0915  BP: 107/53   Pulse: 76 78  Temp: 98.4 F (36.9 C)  Resp: 18 16    Skin clean, dry and intact without evidence of skin break down, no evidence of skin tears noted. IV catheter discontinued intact. Site without signs and symptoms of complications. Dressing and pressure applied. Pt denies pain at this time. No complaints noted.  An After Visit Summary was printed and given to the patient. Patient escorted via WC, and D/C home via private auto.  Kristee Angus A 10/29/2015 3:27 PM

## 2015-10-30 LAB — CULTURE, BLOOD (ROUTINE X 2)
Culture: NO GROWTH
Culture: NO GROWTH

## 2015-10-31 NOTE — Discharge Summary (Signed)
[facility] Discharge Summary   Patient: Melissa Gonzales    ZOX:[id number] PCP: Ezequiel KayserPERINI,MARK A, MD    DOB: Sep 01, 1941 Date of admission: 10/25/2015  Date of discharge: 10/29/2015   Discharge Diagnoses:  Principal Problem:   Community acquired pneumonia Active Problems:   HYPERCHOLESTEROLEMIA   Anxiety state   Essential hypertension   Depressive disorder   Urge incontinence   Parapneumonic effusion   Recommendations for Outpatient Follow-up:  1. Follow up with PCP for repeat chest x ray to ensure resolution of the pleural effusion.   Diet recommendation: regular diet  Activity: The patient is advised to gradually reintroduce usual activities.  Discharge Condition: good  History of present illness: As per the H and P dictated on admission, "Patient has a past medical history significant for HTN, HLD, depression, anxiety, and glaucoma; who presents after recently being diagnosed with pneumonia with continued complaints of weakness and cough. Symptoms initially brought her to urgent care approximately 5 days ago. She was evaluated and was given a Z-Pak for the possibility of a community-acquired pneumonia. Patient reports taking the Z-Pak as advised however symptoms did not improve. She continues to have this productive cough with yellowish sputum production and feel short of breath. She has been taking Mucinex in addition to the antibiotics without any relief of symptoms. She endorses associated symptoms of nasal congestion and postnasal drip. Symptoms worsened with activity. Denies feeling short of breath at rest. Denies any fever, chills, diarrhea, abdominal pain, nausea, or vomiting. Patient knows that she just wasn't feeling better and was advised to return if symptoms did not improve. She went to urgent care today and a chest x-ray revealed pleural effusions. She was transferred to the emergency department for further evaluation. Patient was seen to have decreased oxygen  saturations with ambulation to 87% on room air. Initial lab work showed a elevated WBC count of 13.7."  Hospital Course:  Summary of her active problems in the hospital is as following. 1. Community acquired pneumonia presents with worsening cough and weakness. Treated with Z-Pak as an outpatient. Did not improve. X-ray showed pleural effusion. Thoracentesis is suggestive of parapneumonic effusion. Cultures remained negative. Patient will be discharged on vantin and azithromycin and continue pain medication. Continue incentive spirometry and may need repeat chest x ray to ensure resolution of the effusion.  2. Essential hypertension. Continuing home medication.  3. Anxiety. Continuing Xanax.  4. Chronic pain syndrome. currently we will continue oxycodone for acute pain. Continue Zanaflex.  5. Mood disorder. Continue home medications.  All other chronic medical condition were stable during the hospitalization.  Patient was ambulatory without any assistance. On the day of the discharge the patient's oxygenation remained stable on room air on both at resent and at exertion, and no other acute medical condition were reported by patient. the patient was felt safe to be discharge at home with family support.  Procedures and Results:  thoracentesis   Consultations:  none  Discharge Exam: Filed Weights   10/26/15 0014 10/26/15 2129 10/27/15 2002  Weight: 65.4 kg (144 lb 2.9 oz) 65.9 kg (145 lb 4.5 oz) 64.411 kg (142 lb)   Filed Vitals:   10/29/15 0728 10/29/15 0915  BP: 107/53   Pulse: 76 78  Temp: 98.4 F (36.9 C)   Resp: 18 16   General: Appear in no distress, no Rash; Oral Mucosa moist. Cardiovascular: S1 and S2 Present, no Murmur, no JVD Respiratory: Bilateral Air entry present and basal Crackles, no wheezes Abdomen: Bowel Sound  present, Soft and no tenderness Extremities: no Pedal edema, no calf tenderness Neurology: Grossly no focal neuro deficit.  DISCHARGE  MEDICATION:  Discharge Medication List as of 10/29/2015  2:36 PM    START taking these medications   Details  azithromycin (ZITHROMAX) 500 MG tablet Take 1 tablet (500 mg total) by mouth daily., Starting 10/29/2015, Until Discontinued, Print    benzonatate (TESSALON) 200 MG capsule Take 1 capsule (200 mg total) by mouth 3 (three) times daily as needed for cough., Starting 10/29/2015, Until Discontinued, Print    cefpodoxime (VANTIN) 200 MG tablet Take 1 tablet (200 mg total) by mouth every 12 (twelve) hours., Starting 10/29/2015, Until Discontinued, Print    lidocaine (LIDODERM) 5 % Place 1 patch onto the skin daily. Remove & Discard patch within 12 hours or as directed by MD, Starting 10/29/2015, Until Discontinued, Print    oxyCODONE (OXY IR/ROXICODONE) 5 MG immediate release tablet Take 1 tablet (5 mg total) by mouth every 6 (six) hours as needed for severe pain., Starting 10/29/2015, Until Discontinued, Print      CONTINUE these medications which have NOT CHANGED   Details  ALPHAGAN P 0.1 % SOLN Place 1 drop into both eyes every 12 (twelve) hours. , Starting 04/05/2012, Until Discontinued, Historical Med    ALPRAZolam (XANAX) 0.25 MG tablet TAKE 1 TABLET BY MOUTH TWICE DAILY AS NEEDED FOR ANXIETY, Print    atorvastatin (LIPITOR) 40 MG tablet take 1 tablet by mouth once daily, Normal    Calcium Carb-Cholecalciferol (CALCIUM 600 + D PO) Take 1 tablet by mouth 2 (two) times daily., Until Discontinued, Historical Med    dorzolamide-timolol (COSOPT) 22.3-6.8 MG/ML ophthalmic solution Place 1 drop into both eyes 2 (two) times daily. , Starting 03/20/2011, Until Discontinued, Historical Med    esomeprazole (NEXIUM) 40 MG capsule Take 1 capsule (40 mg total) by mouth daily before breakfast., Starting 05/22/2014, Until Discontinued, Normal    glucosamine-chondroitin 500-400 MG tablet Take 1 tablet by mouth 2 (two) times daily., Until Discontinued, Historical Med    hydrochlorothiazide  (MICROZIDE) 12.5 MG capsule Take 1 capsule (12.5 mg total) by mouth daily., Starting 05/05/2015, Until Discontinued, Normal    latanoprost (XALATAN) 0.005 % ophthalmic solution Place 1 drop into both eyes at bedtime. , Starting 06/21/2014, Until Discontinued, Historical Med    lisinopril (PRINIVIL,ZESTRIL) 20 MG tablet Take 1 tablet (20 mg total) by mouth daily., Starting 01/18/2015, Until Discontinued, Normal    loratadine (CLARITIN) 10 MG tablet Take 10 mg by mouth daily as needed for allergies. , Until Discontinued, Historical Med    Multiple Vitamins-Minerals (MULTIVITAMIN WITH MINERALS) tablet Take 1 tablet by mouth daily., Until Discontinued, Historical Med    Omega-3 Fatty Acids (FISH OIL) 1000 MG CAPS Take 1 capsule by mouth 2 (two) times daily., Until Discontinued, Historical Med    solifenacin (VESICARE) 10 MG tablet Take 10 mg by mouth daily., Until Discontinued, Historical Med    tiZANidine (ZANAFLEX) 4 MG tablet Take 1 tablet (4 mg total) by mouth every 8 (eight) hours as needed for muscle spasms., Starting 04/16/2015, Until Discontinued, Normal    oxybutynin (DITROPAN-XL) 10 MG 24 hr tablet Take 1 tablet (10 mg total) by mouth daily., Starting 05/12/2013, Until Discontinued, Normal       No Known Allergies   The results of significant diagnostics from this hospitalization (including imaging, microbiology, ancillary and laboratory) are listed below for reference.    Significant Diagnostic Studies: Dg Chest 1 View  10/27/2015  CLINICAL DATA:  Status post right-sided thoracentesis EXAM: CHEST 1 VIEW COMPARISON:  October 25, 2015 and October 26, 2015 FINDINGS: Right pleural effusion is smaller status post thoracentesis. Small amount of right effusion remains with right base atelectasis. There is no demonstrable pneumothorax. Left lung is clear. Heart size and pulmonary vascularity are normal. No adenopathy. IMPRESSION: No pneumothorax. Right pleural effusion smaller following  thoracentesis. Small right effusion with right base atelectasis remains. Left lung clear. No change in cardiac silhouette. Electronically Signed   By: Bretta Bang III M.D.   On: 10/27/2015 10:07   Dg Chest 2 View  10/29/2015  CLINICAL DATA:  Parapneumonic effusion, hypertension EXAM: CHEST  2 VIEW COMPARISON:  10/27/2015 FINDINGS: Normal heart size, mediastinal contours and pulmonary vascularity. Persistent RIGHT basilar effusion and atelectasis. Minimal central bronchitic changes. Remaining lungs clear. No pleural effusion or pneumothorax. Bones demineralized with mild thoracolumbar scoliosis. Surgical clips RIGHT upper quadrant consistent with prior cholecystectomy. IMPRESSION: Persistent RIGHT basilar pleural effusion atelectasis, increased since 10/27/2015. Electronically Signed   By: Ulyses Southward M.D.   On: 10/29/2015 08:14   Dg Chest 2 View  10/25/2015  CLINICAL DATA:  Patient told she has right-sided pneumonia at evaluation 4 days ago. EXAM: CHEST  2 VIEW COMPARISON:  None. FINDINGS: Lungs are adequately inflated with a small to moderate right pleural effusion likely with associated atelectasis in the right base. Cannot completely exclude infection in the right base. Suggestion mild emphysematous disease. Cardiomediastinal silhouette is within normal. There are mild degenerate changes of the spine. IMPRESSION: Small to moderate right pleural effusion likely with associated basilar atelectasis. Cannot exclude infection in the right base. Electronically Signed   By: Elberta Fortis M.D.   On: 10/25/2015 18:54   Dg Chest Right Decubitus  10/26/2015  CLINICAL DATA:  Shortness of breath and cough, 10 days duration. Right-sided pleural effusion. EXAM: CHEST - RIGHT DECUBITUS COMPARISON:  10/25/2015 FINDINGS: Decubitus view shows layering of the majority of the right pleural effusion. There is volume loss in the right lower lobe. The left chest appears clear and normal. IMPRESSION: Right pleural  effusion largely layers in the decubitus position. Electronically Signed   By: Paulina Fusi M.D.   On: 10/26/2015 08:02   US Thoracentesis Asp Pleural Space W/img Guide  10/27/2015  CLINICAL DATA:  Pneumonia, right-sided pleural effusion. Request for diagnostic and therapeutic thoracentesis EXAM: ULTRASOUND GUIDED RIGHT THORACENTESIS COMPARISON:  None. PROCEDURE: An ultrasound guided thoracentesis was thoroughly discussed with the patient and questions answered. The benefits, risks, alternatives and complications were also discussed. The patient understands and wishes to proceed with the procedure. Written consent was obtained. Ultrasound was performed to localize and mark an adequate pocket of fluid in the right chest. The area was then prepped and draped in the normal sterile fashion. 1% Lidocaine was used for local anesthesia. Under ultrasound guidance a Safe-T-Centesis catheter was introduced. Thoracentesis was performed. The catheter was removed and a dressing applied. COMPLICATIONS: None immediate. FINDINGS: A total of approximately 550 mL of hazy, blood-tinged fluid was removed. A fluid sample wassent for laboratory analysis. IMPRESSION: Successful ultrasound guided right thoracentesis yielding 550 mL of pleural fluid. Read by: Brayton El PA-C Electronically Signed   By: Corlis Leak M.D.   On: 10/27/2015 10:04    Microbiology: Recent Results (from the past 240 hour(s))  Blood culture (routine x 2)     Status: None   Collection Time: 10/25/15  9:08 PM  Result Value Ref Range Status   Specimen Description BLOOD LEFT  ANTECUBITAL  Final   Special Requests BOTTLES DRAWN AEROBIC AND ANAEROBIC 5CC   Final   Culture NO GROWTH 5 DAYS  Final   Report Status 10/30/2015 FINAL  Final  Blood culture (routine x 2)     Status: None   Collection Time: 10/25/15  9:10 PM  Result Value Ref Range Status   Specimen Description BLOOD RIGHT ANTECUBITAL  Final   Special Requests BOTTLES DRAWN AEROBIC AND  ANAEROBIC 5CC  Final   Culture NO GROWTH 5 DAYS  Final   Report Status 10/30/2015 FINAL  Final  Culture, body fluid-bottle     Status: None (Preliminary result)   Collection Time: 10/27/15  9:32 AM  Result Value Ref Range Status   Specimen Description FLUID RIGHT PLEURAL  Final   Special Requests BOTTLES DRAWN AEROBIC AND ANAEROBIC  Final   Culture NO GROWTH 4 DAYS  Final   Report Status PENDING  Incomplete  Gram stain     Status: None   Collection Time: 10/27/15  9:32 AM  Result Value Ref Range Status   Specimen Description FLUID RIGHT PLEURAL  Final   Special Requests NONE  Final   Gram Stain   Final    FEW WBC PRESENT,BOTH PMN AND MONONUCLEAR NO ORGANISMS SEEN    Report Status 10/27/2015 FINAL  Final     Labs: CBC:  Recent Labs Lab 10/25/15 2004 10/26/15 0055 10/27/15 0906 10/28/15 0525 10/29/15 0534  WBC 13.7* 11.9* 11.1* 10.7* 9.1  NEUTROABS 10.6*  --   --   --   --   HGB 14.8 13.5 14.9 13.3 12.6  HCT 43.8 40.0 44.9 39.5 38.4  MCV 97.8 97.6 98.5 96.3 97.0  PLT 442* 402* 498* 393 368   Basic Metabolic Panel:  Recent Labs Lab 10/25/15 2004 10/27/15 0906 10/28/15 0525 10/29/15 0534  NA 136 139 133* 134*  K 3.5 3.6 3.8 3.6  CL 99* 100* 98* 97*  CO2 26 27 26 27   GLUCOSE 115* 163* 105* 105*  BUN 12 9 7 8   CREATININE 0.75 0.57 0.54 0.53  CALCIUM 9.4 9.2 8.6* 8.7*   Liver Function Tests:  Recent Labs Lab 10/25/15 2004 10/28/15 0525  AST 32 44*  ALT 34 37  ALKPHOS 101 106  BILITOT 0.3 0.6  PROT 7.4 6.3*  ALBUMIN 3.1* 2.3*   Time spent: 30 minutes  Signed:  Isaiah Torok  [facility] 10/29/2015, 6:53 PM

## 2015-11-01 LAB — CULTURE, BODY FLUID-BOTTLE: CULTURE: NO GROWTH

## 2015-11-01 LAB — CULTURE, BODY FLUID W GRAM STAIN -BOTTLE

## 2015-11-04 ENCOUNTER — Other Ambulatory Visit: Payer: Self-pay | Admitting: Internal Medicine

## 2015-11-04 DIAGNOSIS — J90 Pleural effusion, not elsewhere classified: Secondary | ICD-10-CM

## 2015-11-04 DIAGNOSIS — J189 Pneumonia, unspecified organism: Secondary | ICD-10-CM

## 2015-11-05 ENCOUNTER — Inpatient Hospital Stay
Admission: RE | Admit: 2015-11-05 | Discharge: 2015-11-05 | Disposition: A | Discharge status: Home / Self Care | Payer: Medicare Other | Source: Ambulatory Visit | Attending: Internal Medicine | Admitting: Internal Medicine

## 2015-11-09 ENCOUNTER — Encounter (HOSPITAL_COMMUNITY): Payer: Self-pay

## 2015-11-09 ENCOUNTER — Ambulatory Visit (HOSPITAL_COMMUNITY)
Admission: RE | Admit: 2015-11-09 | Discharge: 2015-11-09 | Disposition: A | Discharge status: Home / Self Care | Payer: Medicare Other | Source: Ambulatory Visit | Attending: Internal Medicine | Admitting: Internal Medicine

## 2015-11-09 DIAGNOSIS — J189 Pneumonia, unspecified organism: Secondary | ICD-10-CM | POA: Diagnosis present

## 2015-11-09 DIAGNOSIS — J90 Pleural effusion, not elsewhere classified: Secondary | ICD-10-CM | POA: Diagnosis not present

## 2015-11-09 DIAGNOSIS — R918 Other nonspecific abnormal finding of lung field: Secondary | ICD-10-CM | POA: Insufficient documentation

## 2015-11-09 DIAGNOSIS — I2510 Atherosclerotic heart disease of native coronary artery without angina pectoris: Secondary | ICD-10-CM | POA: Insufficient documentation

## 2015-11-09 MED ORDER — IOHEXOL 300 MG/ML  SOLN
75.0000 mL | Freq: Once | INTRAMUSCULAR | Status: AC | PRN
  Administered 2015-11-09: 75 mL via INTRAVENOUS

## 2015-11-11 ENCOUNTER — Other Ambulatory Visit: Payer: Medicare Other

## 2015-11-17 ENCOUNTER — Other Ambulatory Visit: Payer: Self-pay

## 2015-11-17 NOTE — Patient Outreach (Signed)
[facility]) Care Management  11/17/2015  Melissa Gonzales Apr 04, 1941 [id number]  [name]  Providers: Primary MD: Dr. 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  Mobility: Patient ambulates independently with no assistive devices.  Falls: 0 Pain: back Transportation: self  Caregiver: family as needed DME: scales, BP cuff  [facility] conditions:   Admissions: 1 10/25/2015 - 10/29/2015   Community acquired pneumonia ER visits: 1 Patient confirms the shortness of breath she has had has not worsened and she is feeling better.  States fatigue was discussed with Dr. Waynard EdwardsPerini and no concerns at this time.  States she has completed her post hospital follow-up visit with Dr. Waynard EdwardsPerini.    Medications:  Less than 10 Co-pay cost issues:  None Flu Vaccine:  08/09/2015 PCV13 & PPSV2: 05/03/2015  Consent: [facility]; no home visit needed.   Plan:  [name]:  11/17/2015 Telephonic RN CM services 11/17/2015  RN CM notified [facility] Care [facility] Assistant: agreed to services/case opened. RN CM will schedule for next contact call within 1 week.  RN CM advised to please notify MD of any changes in condition prior to scheduled appt's.   RN CM provided contact name and # [phone] or main office # [phone] and 24-hour nurse line # [phone].  RN CM confirmed patient is aware of 911 services for urgent emergency needs.  Donato Schultzrystal Jalesha Plotz, RN, BSN, [facility], CCM  [facility] Care Management Care Management Coordinator [phone] Direct [phone] Cell [phone] Office [phone] Fax

## 2015-11-22 NOTE — Patient Outreach (Signed)
[facility]) Care Management  11/22/2015  Melissa Gonzales Sep 29, 1941 [id number]   Patient triggered RED on EMMI Pneumonia Dashboard, notification sent to Donato Schultzrystal Hutchinson, RN.  Thanks, Melissa Gonzales, [facility] Care Management [facility] CM Assistant Phone: [phone] Fax: [phone]

## 2015-11-23 ENCOUNTER — Other Ambulatory Visit: Payer: Self-pay

## 2015-11-23 ENCOUNTER — Ambulatory Visit: Payer: Self-pay

## 2015-11-23 NOTE — Patient Outreach (Signed)
[facility]) Care Management  11/23/2015  Melissa Gonzales 1941/04/19 [id number]   [name] Red Alert:  Day #8 More short of breath than day before? Yes Initial Assessment completed this call.   Providers: Primary MD: Dr. 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  Mobility: Patient ambulates independently with no assistive devices; some shortness of breath associated to pneumonia recovery.   Falls: 0 Pain: back Transportation: self  Caregiver: family as needed Advanced Directives:  None  DME: scales, BP cuff  [facility] conditions: Other:  HTN [name] Admissions: 1 10/25/2015 - 10/29/2015 Community acquired pneumonia ER visits: 1 Patient confirms shortness of breath has not worsened. States fatigue was discussed with Dr. Waynard Edwards on post-hospital [facility] appt  and no concerns at this time. HBP:  BP 98/74 and taking medication as prescribed.  Weight 138 lbs Height 64 inches  Medications:  Less than 10 Co-pay cost issues: None Flu Vaccine: 08/09/2015 PCV13 & PPSV2: 05/03/2015  Consent: Patient agreed to [facility] telephonic services only; no home visit needed.   Plan:  Referral Date:  11/16/2015 [name]: 11/17/2015, 11/23/2015.  Telephonic RN CM start date: 11/17/2015 Initial Assessment:  11/23/2015  RN CM sent MD Barriers letter & Initial Assessment.  RN CM sent Introductory package to patient.  RN CM will schedule for next contact call within 1 month or as needed. RN CM will follow-up on Advanced Directives.    RN CM advised to please notify MD of any changes in condition prior to scheduled appt's.  RN CM provided contact name and # [phone] or main office # [phone] and 24-hour nurse line # [phone].  RN CM confirmed patient is aware of 911 services for urgent emergency  needs.  Donato Schultz, RN, BSN, [facility], CCM  [facility] Management Coordinator [phone] Direct [phone] Cell [phone] Office [phone] Fax

## 2015-11-24 ENCOUNTER — Ambulatory Visit: Payer: Self-pay

## 2015-11-25 ENCOUNTER — Ambulatory Visit: Payer: Self-pay

## 2015-12-06 ENCOUNTER — Other Ambulatory Visit: Payer: Self-pay | Admitting: *Deleted

## 2015-12-06 MED ORDER — SERTRALINE HCL 100 MG PO TABS: 100.0000 mg | ORAL_TABLET | Freq: Every day | ORAL | Status: DC

## 2015-12-21 ENCOUNTER — Other Ambulatory Visit: Payer: Self-pay

## 2015-12-21 NOTE — Patient Outreach (Signed)
[facility] ([facility]) Care Management  12/21/2015  Debar A Ram 05/26/1941 [id number]  Providers: Primary MD: Dr. 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  Mobility: Patient ambulates independently with no assistive devices.  Walking more.  Falls: 0 Pain: back Transportation: self  Caregiver: family as needed - daughter living in [place], [place].  Advanced Directives: yes  DME: scales, BP cuff, Pulse oximeter.   [facility] conditions: Other: HTN [name] Admissions: 1 10/25/2015 - 10/29/2015 Community acquired pneumonia ER visits: 1 States fatigue slowly improving.  MD has ordered OTC B12 tablets.  HBP: BP 90/70 on last MD visit.  MD has modified BP medication regime due to lower BP readings.  Weight 138 lbs  Medications:  Less than 10 Co-pay cost issues: None Flu Vaccine: 08/09/2015 PCV13 & PPSV2: 05/03/2015  Plan:  Referral Date: 11/16/2015 [name]: 11/17/2015 and 11/23/2015.  Telephonic RN CM start date: 11/17/2015 - 12/21/2015 Initial Assessment: 11/23/2015  RN CM advised goals of care met.  Should new issues arise; consult Dr. Perini who can make new referral to [facility].  RN CM notified [facility] Administrative Assistant: case closed; goals of care met.  RN CM notified Dr. Perini via case closure letter.   RN CM advised to please notify MD of any changes in condition prior to scheduled appt's.  RN CM confirmed patient is aware of 911 services for urgent emergency needs.  Tiasia Weberg, RN, BSN, MSHL, CCM  [facility] Care Management Care Management Coordinator [phone] Direct [phone] Cell [phone] Office [phone] Fax 

## 2016-01-13 IMAGING — DX DG CHEST DECUBITUS*R*
1 series · 1 of 1 positions shown · non-contrast
Comparison: 10/25/2015

CLINICAL DATA: Shortness of breath and cough, 10 days duration.
Right-sided pleural effusion.

EXAM:
CHEST - RIGHT DECUBITUS

[w chest decub]
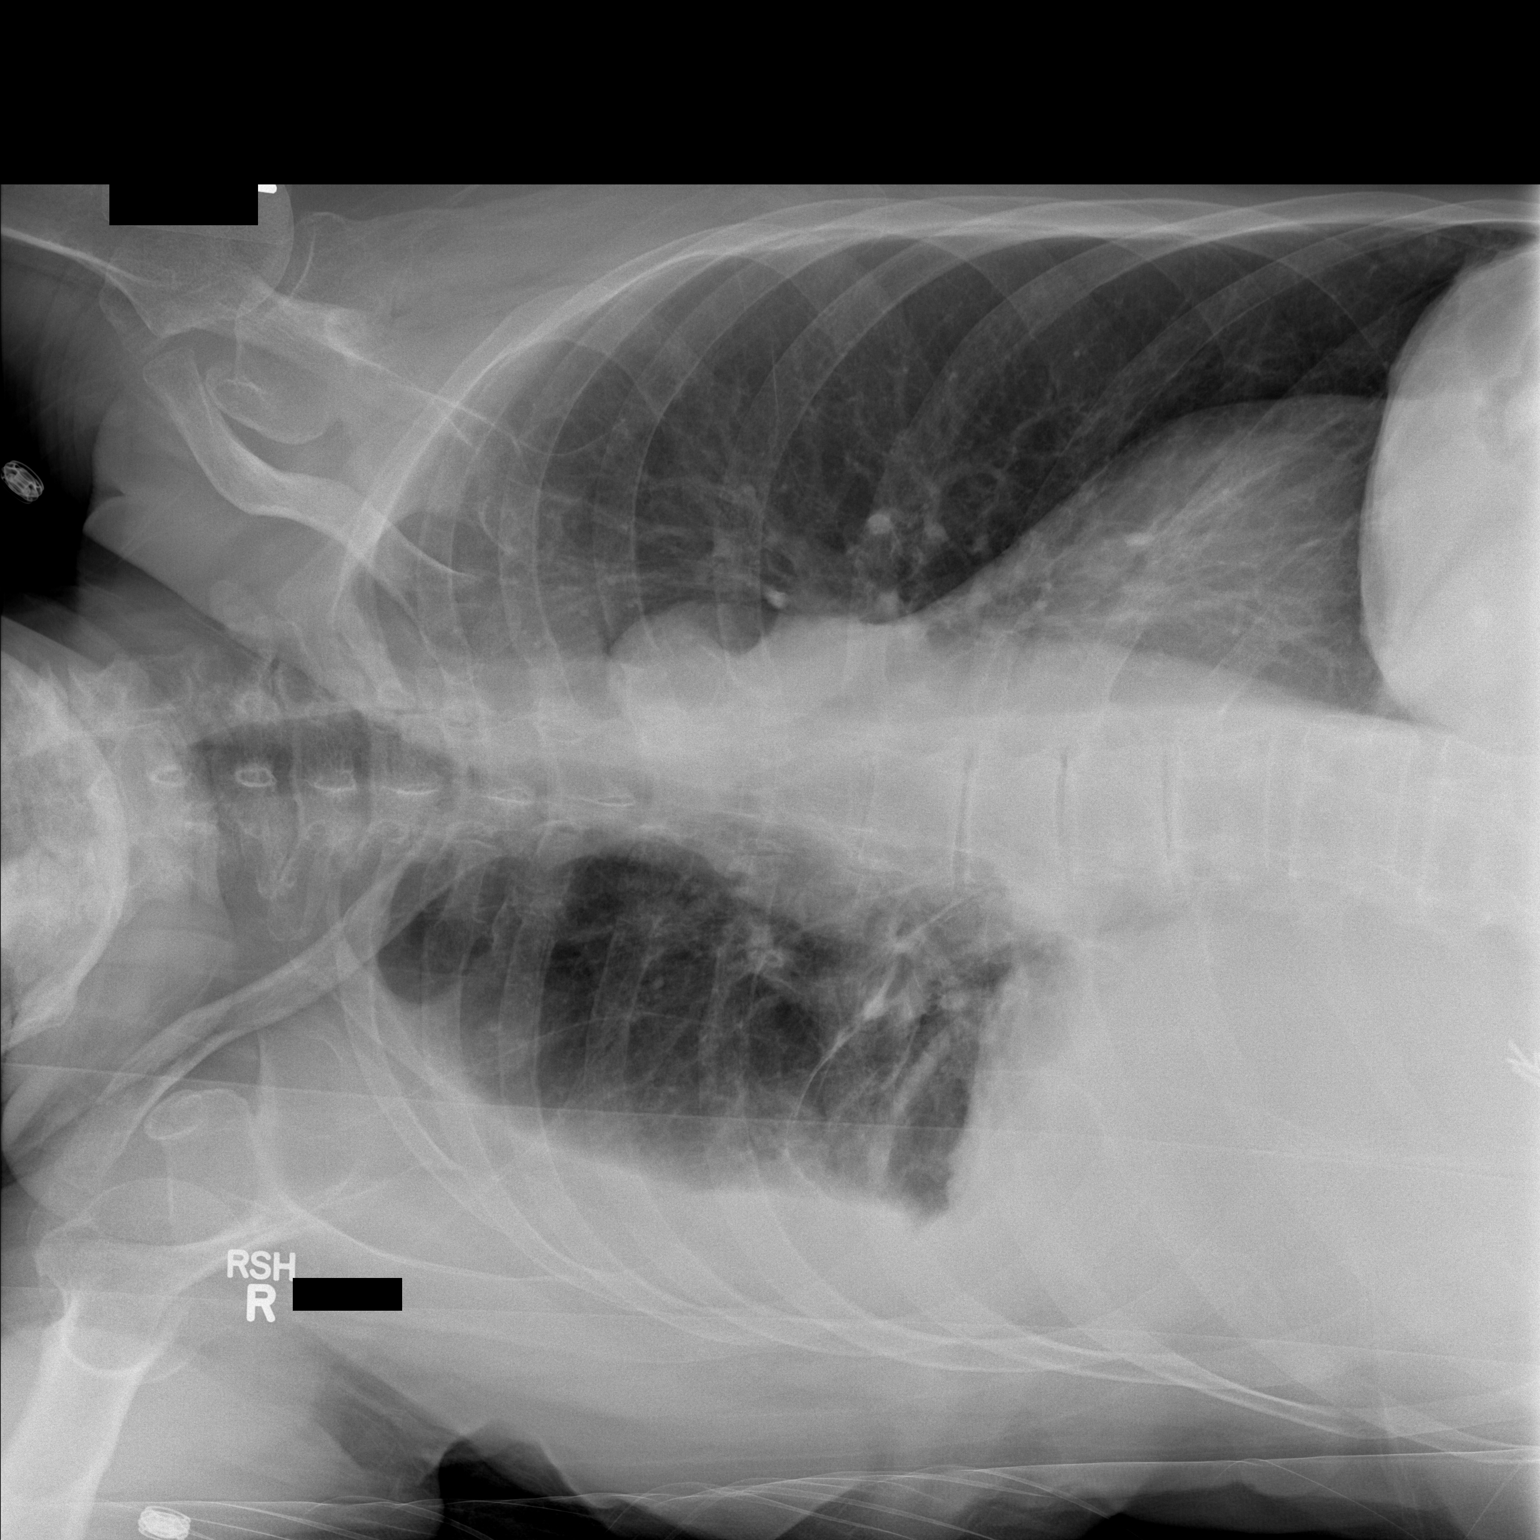

[1 of 1 positions shown; findings below may reference images not displayed]

FINDINGS: Decubitus view shows layering of the majority of the right pleural
effusion. There is volume loss in the right lower lobe. The left
chest appears clear and normal.
IMPRESSION: Right pleural effusion largely layers in the decubitus position.

## 2016-02-09 ENCOUNTER — Other Ambulatory Visit: Payer: Self-pay

## 2016-02-09 DIAGNOSIS — Z1231 Encounter for screening mammogram for malignant neoplasm of breast: Secondary | ICD-10-CM

## 2016-02-11 ENCOUNTER — Other Ambulatory Visit: Payer: Self-pay | Admitting: *Deleted

## 2016-02-11 DIAGNOSIS — I10 Essential (primary) hypertension: Secondary | ICD-10-CM

## 2016-02-11 MED ORDER — LISINOPRIL 20 MG PO TABS: 20.0000 mg | ORAL_TABLET | Freq: Every day | ORAL | Status: DC

## 2016-03-13 ENCOUNTER — Ambulatory Visit
Admission: RE | Admit: 2016-03-13 | Discharge: 2016-03-13 | Disposition: A | Discharge status: Home / Self Care | Payer: Medicare Other | Source: Ambulatory Visit

## 2016-03-13 DIAGNOSIS — Z1231 Encounter for screening mammogram for malignant neoplasm of breast: Secondary | ICD-10-CM

## 2017-02-09 ENCOUNTER — Other Ambulatory Visit: Payer: Self-pay | Admitting: Family Medicine

## 2017-02-09 DIAGNOSIS — I10 Essential (primary) hypertension: Secondary | ICD-10-CM

## 2017-04-13 ENCOUNTER — Other Ambulatory Visit: Payer: Self-pay | Admitting: Internal Medicine

## 2017-04-13 DIAGNOSIS — Z1231 Encounter for screening mammogram for malignant neoplasm of breast: Secondary | ICD-10-CM

## 2017-04-26 ENCOUNTER — Ambulatory Visit
Admission: RE | Admit: 2017-04-26 | Discharge: 2017-04-26 | Disposition: A | Discharge status: Home / Self Care | Payer: Medicare Other | Source: Ambulatory Visit | Attending: Internal Medicine | Admitting: Internal Medicine

## 2017-04-26 DIAGNOSIS — Z1231 Encounter for screening mammogram for malignant neoplasm of breast: Secondary | ICD-10-CM

## 2017-10-05 ENCOUNTER — Encounter: Payer: Self-pay | Admitting: Physical Therapy

## 2017-10-05 ENCOUNTER — Ambulatory Visit: Payer: Medicare Other | Attending: Internal Medicine | Admitting: Physical Therapy

## 2017-10-05 DIAGNOSIS — M6281 Muscle weakness (generalized): Secondary | ICD-10-CM | POA: Insufficient documentation

## 2017-10-05 DIAGNOSIS — R2681 Unsteadiness on feet: Secondary | ICD-10-CM | POA: Insufficient documentation

## 2017-10-05 DIAGNOSIS — Z9181 History of falling: Secondary | ICD-10-CM | POA: Insufficient documentation

## 2017-10-05 DIAGNOSIS — R42 Dizziness and giddiness: Secondary | ICD-10-CM | POA: Diagnosis present

## 2017-10-05 NOTE — Therapy (Signed)
[facility] [address] [place], [place], [address] Phone: [phone]   Fax:  [phone]  Physical Therapy Evaluation  Patient Details  Name: Melissa Gonzales MRN: [id number] Date of Birth: 12-26-1940 Referring Provider: Rodrigo RanPerini, Mark MD   Encounter Date: 10/05/2017  PT End of Session - 10/05/17 2008    Visit Number  1    Number of Visits  17 eval plus 16 visits    Date for PT Re-Evaluation  12/04/17    Authorization Type  [employer] MCR    Authorization Time Period  10/05/17-12/04/16    PT Start Time  0846    PT Stop Time  0930    PT Time Calculation (min)  44 min    Activity Tolerance  Patient tolerated treatment well    Behavior During Therapy  [facility] for tasks assessed/performed       Past Medical History:  Diagnosis Date  . Anxiety   . Arthritis   . Depression   . Diverticulosis   . GERD (gastroesophageal reflux disease)   . Glaucoma    Bilateral eyes  . Hyperlipidemia   . Hypertension   . Pneumonia    hx of  . Urgency of urination     Past Surgical History:  Procedure Laterality Date  . BACK SURGERY    . CHOLECYSTECTOMY  1993  . COLONOSCOPY    . EYE SURGERY     Cataract Surgery Bilateral, 5 years ago  . MAXIMUM ACCESS (MAS)POSTERIOR LUMBAR INTERBODY FUSION (PLIF) 1 LEVEL N/A 03/17/2015   Procedure: Lumbar five -sacral one Maximum access posterior lumbar interbody fusion/Lumbar two-three  Laminectomy and extension of instrumentation to Sacral-one;  Surgeon: Tia Alertavid S Jones, MD;  Location: [facility];  Service: Neurosurgery;  Laterality: N/A;  . MAXIMUM ACCESS (MAS)POSTERIOR LUMBAR INTERBODY FUSION (PLIF) 2 LEVEL N/A 11/27/2014   Procedure: LUMBAR THREE-FOUR, LUMBAR FOUR-FIVE MAXIMUM ACCESS POSTERIOR LUMBAR INTERBODY FUSION;  Surgeon: Tia Alertavid S Jones, MD;  Location: [facility];  Service: Neurosurgery;  Laterality: N/A;  L3-4 L4-5 MAXIMUM ACCESS POSTERIOR LUMBAR INTERBODY FUSION  . VAGINAL HYSTERECTOMY  1970s    uterine prolapse    There were no vitals filed for this visit.   Subjective Assessment - 10/05/17 0851    Subjective  I'm here because I keep falling. I just lose my balance. At least 3 falls, starting back to 2016 when had back surgery and surgeon broke my sacrum. That really limited my acitivity and I got much weaker. Twice did call EMS with life alert button. Once seemed to pass out and MD just had her begin to check orthostatic BPs (see notes). Once I turned quickly in kitchen and lost balance and fell. Just recently [facility] [employer]. Had her cane and caught her foot.     Currently in Pain?  Yes    Pain Score  2     Pain Location  Shoulder    Pain Orientation  Right    Pain Descriptors / Indicators  Aching    Pain Type  Acute pain    Pain Onset  In the past 7 days    Pain Frequency  Intermittent    Multiple Pain Sites  Yes    Pain Score  7    Pain Location  Knee    Pain Orientation  Left    Pain Descriptors / Indicators  Aching    Pain Type  Acute pain    Pain Onset  In the past 7 days    Pain Frequency  Intermittent    Aggravating Factors   walking    Pain Relieving Factors  sit         [facility] PT Assessment - 10/05/17 0849      Assessment   Medical Diagnosis  vertigo    Referring Provider  Rodrigo Ran MD    Next MD Visit  Feb 2019    Prior Therapy  "not for this"      Precautions   Precautions  Fall      Restrictions   Weight Bearing Restrictions  No      Balance Screen   Has the patient fallen in the past 6 months  Yes    How many times?  3    Has the patient had a decrease in activity level because of a fear of falling?   No    Is the patient reluctant to leave their home because of a fear of falling?   No      Prior Function   Level of Independence  Independent no device      Cognition   Overall Cognitive Status  Within Functional Limits for tasks assessed      Observation/Other Assessments   Observations  walks from lobby to  exam room with very little head movement or turning    Focus on Therapeutic Outcomes (FOTO)   not completed      Sensation   Light Touch  Impaired Detail    Light Touch Impaired Details  Impaired RLE;Impaired LLE    Semmes Weinstein Monofilament Scale  Diminished Protective      Coordination   Gross Motor Movements are Fluid and Coordinated  Yes    Fine Motor Movements are Fluid and Coordinated  No rapid toe tapping degraded    Heel Shin Test  wnl      Posture/Postural Control   Posture/Postural Control  No significant limitations      ROM / Strength   AROM / PROM / Strength  AROM;Strength      AROM   Overall AROM   Within functional limits for tasks performed bil LEs      Strength   Overall Strength  Deficits    Overall Strength Comments  generalized weakness bil LEs evident with need for UEs for sit to stand      Transfers   Transfers  Sit to Stand;Stand to Sit    Sit to Stand  6: Modified independent (Device/Increase time);With upper extremity assist incr time    Stand to Sit  6: Modified independent (Device/Increase time)      Ambulation/Gait   Ambulation/Gait  Yes    Ambulation/Gait Assistance  7: Independent    Ambulation Distance (Feet)  60 Feet 100    Assistive device  None    Gait Pattern  Step-through pattern;Decreased arm swing - right;Decreased arm swing - left;Decreased step length - right;Decreased stance time - left;Decreased hip/knee flexion - right;Decreased hip/knee flexion - left;Lateral hip instability;Decreased trunk rotation    Ambulation Surface  Indoor    Gait velocity  32.8 ft/12.84 sec=2.55 ft/sec norm for age 67.79 ft/sec         Vestibular Assessment - 10/05/17 0904      Vestibular Assessment   General Observation  reports spontaneous loss of balance      Symptom Behavior   Type of Dizziness  Imbalance    Frequency of Dizziness  daily    Aggravating Factors  Turning body quickly;Turning head quickly    Relieving Factors  Slow movements       Occulomotor Exam   Occulomotor Alignment  Normal    Spontaneous  Absent    Gaze-induced  Absent    Smooth Pursuits  Saccades occasionally with left to right    Saccades  Comment "frog hops"; when moving eyes quickly becomes dizzy      Vestibulo-Occular Reflex   VOR 1 Head Only (x 1 viewing)  intact but ? moving head quickly enough to trigger reflex    Comment  HIT negative bil      Orthostatics   Orthostatics Comment  pt has recently been measuring at home per MD; supine 142/74, sit 135/72, stand 125/60 but asymptomatic         Objective measurements completed on examination: See above findings.              PT Education - 10/05/17 2005    Education provided  Yes    Education Details  combined input of inner ear, vision, and sensation contribute to maintaining balance (pt has deficits in all areas); spontaneous LOB that is easily corrected with light touch of hand on external support indicative of decreased sensation in feet;     Person(s) Educated  Patient    Methods  Explanation    Comprehension  Verbalized understanding       PT Short Term Goals - 10/05/17 2031      PT SHORT TERM GOAL #1   Title  Patient will be independent with HEP to address strength and balance. (Target all STGs 11/04/17)    Time  4    Period  Weeks    Status  New    Target Date  11/04/17      PT SHORT TERM GOAL #2   Title  Patient will ambulate 500 ft indoors modifed independent with LRAD     Time  4    Period  Weeks    Status  New      PT SHORT TERM GOAL #3   Title  Complete balance assessment (either SOT, FGA or Berg) and set goals as appropriate.     Time  1    Period  Weeks    Status  New        PT Long Term Goals - 10/05/17 2036      PT LONG TERM GOAL #1   Title  Patient will be independent with updated HEP (Target all STGs 11/04/17)    Time  8    Period  Weeks    Status  New    Target Date  11/04/17      PT LONG TERM GOAL #2   Title  Patient will ambulate  500 ft with LRAD on unlevel outdoor terr. ain    Time  8    Period  Weeks    Status  New      PT LONG TERM GOAL #3   Title  Patient's gait velocity will improve to 2.62 ft/sec to indicate safe ambulator in a commumity environment.     Time  8    Period  Weeks    Status  New             Plan - 10/05/17 2010    Clinical Impression Statement  Patient presents for PT/Vestibular evaluation due to recent falls. Patient has a complex mix of deficits that can be contributing to her imbalance and falls: decreased sensation in her  feet, dizziness with quick saccades, impaired vision due to cataracts (and per pt decreased peripheral vision), and orthostasis. Further assessment with Marketing executiveBalancemaster Sensory Organization Test will help quantify the degree to which each system is contributing to her balance/falls problem. Anticipate patient can benefit from PT interventions listed below to address inner ear deficits and compensations for decreased sensation in her feet.     History and Personal Factors relevant to plan of care:  PMHx-HTN; advanced glaucoma; osteoporosis;  Personal factors-age, lives alone    Clinical Presentation  Evolving    Clinical Presentation due to:  is currently on BP meds for HTN, however has now developed orhtostasis. She has more numbness in her feet than she realized and is likely progressvive.     Clinical Decision Making  Moderate    Rehab Potential  Good    PT Frequency  2x / week    PT Duration  8 weeks    PT Treatment/Interventions  ADLs/Self Care Home Management;DME Instruction;Gait training;Stair training;Functional mobility training;Therapeutic activities;Therapeutic exercise;Balance training;Neuromuscular re-education;Patient/family education;Passive range of motion;Vestibular;Visual/perceptual remediation/compensation    PT Next Visit Plan  do SOT, do SVA with DVA; educate on proper use of cane; begin corner ex's    Consulted and Agree with Plan of Care  Patient        Patient will benefit from skilled therapeutic intervention in order to improve the following deficits and impairments:  Abnormal gait, Cardiopulmonary status limiting activity, Decreased activity tolerance, Decreased coordination, Decreased balance, Decreased knowledge of use of DME, Decreased mobility, Decreased strength, Difficulty walking, Dizziness, Impaired sensation, Impaired vision/preception  Visit Diagnosis: Dizziness and giddiness - Plan: PT plan of care cert/re-cert  Muscle weakness (generalized) - Plan: PT plan of care cert/re-cert  Unsteady gait - Plan: PT plan of care cert/re-cert  History of falling - Plan: PT plan of care cert/re-cert  G-Codes - 10/05/17 2048    Functional Assessment Tool Used (Outpatient Only)  gait velocity 2.55 ft/sec;     Functional Limitation  Mobility: Walking and moving around    Mobility: Walking and Moving Around Current Status [phone]([id number])  At least 1 percent but less than 20 percent impaired, limited or restricted    Mobility: Walking and Moving Around Goal Status [phone]([id number])  At least 1 percent but less than 20 percent impaired, limited or restricted        Problem List Patient Active Problem List   Diagnosis Date Noted  . Community acquired pneumonia 10/26/2015  . Parapneumonic effusion   . Intractable back pain 04/09/2015  . Intractable pain 04/08/2015  . S/P lumbar spinal fusion 11/27/2014  . Right shoulder pain 09/02/2014  . Low back pain 09/02/2014  . Axillary pain 07/12/2014  . Wellness examination 05/22/2014  . Insomnia 05/14/2013  . Preventive measure 04/15/2012  . Depressive disorder 01/23/2012  . Urge incontinence 01/23/2012  . ARTHRITIS, HANDS, BILATERAL 03/25/2009  . HYPERCHOLESTEROLEMIA 02/27/2008  . MIGRAINE HEADACHE 02/27/2008  . Essential hypertension 02/27/2008  . Anxiety state 08/01/2007    Zena AmosLynn P Bolivar Koranda, PT 10/05/2017, 8:58 PM  [facility] [address]  [address] [place], [place], [address] Phone: [phone]   Fax:  [phone]  Name: Melissa Gonzales MRN: [id number] Date of Birth: 30-Nov-1940

## 2017-10-10 ENCOUNTER — Ambulatory Visit: Payer: Medicare Other | Attending: Internal Medicine | Admitting: Physical Therapy

## 2017-10-10 ENCOUNTER — Encounter: Payer: Self-pay | Admitting: Physical Therapy

## 2017-10-10 DIAGNOSIS — R42 Dizziness and giddiness: Secondary | ICD-10-CM | POA: Diagnosis present

## 2017-10-10 DIAGNOSIS — R2681 Unsteadiness on feet: Secondary | ICD-10-CM | POA: Diagnosis present

## 2017-10-10 DIAGNOSIS — M6281 Muscle weakness (generalized): Secondary | ICD-10-CM | POA: Insufficient documentation

## 2017-10-10 DIAGNOSIS — Z9181 History of falling: Secondary | ICD-10-CM | POA: Diagnosis present

## 2017-10-10 NOTE — Therapy (Signed)
[facility] [address] [place], [place], [address] Phone: [phone]   Fax:  [phone]  Physical Therapy Treatment  Patient Details  Name: Melissa Gonzales MRN: [id number] Date of Birth: 05-Apr-1941 Referring Provider: Rodrigo Ran MD   Encounter Date: 10/10/2017  PT End of Session - 10/10/17 1807    Visit Number  2    Number of Visits  17 eval plus 16 visits    Date for PT Re-Evaluation  12/04/17    Authorization Type  [employer] MCR    Authorization Time Period  10/05/17-12/04/16    PT Start Time  1018    PT Stop Time  1109    PT Time Calculation (min)  51 min    Activity Tolerance  Patient tolerated treatment well    Behavior During Therapy  [facility] for tasks assessed/performed       Past Medical History:  Diagnosis Date  . Anxiety   . Arthritis   . Depression   . Diverticulosis   . GERD (gastroesophageal reflux disease)   . Glaucoma    Bilateral eyes  . Hyperlipidemia   . Hypertension   . Pneumonia    hx of  . Urgency of urination     Past Surgical History:  Procedure Laterality Date  . BACK SURGERY    . CHOLECYSTECTOMY  1993  . COLONOSCOPY    . EYE SURGERY     Cataract Surgery Bilateral, 5 years ago  . MAXIMUM ACCESS (MAS)POSTERIOR LUMBAR INTERBODY FUSION (PLIF) 1 LEVEL N/A 03/17/2015   Procedure: Lumbar five -sacral one Maximum access posterior lumbar interbody fusion/Lumbar two-three  Laminectomy and extension of instrumentation to Sacral-one;  Surgeon: Tia Alert, MD;  Location: [facility];  Service: Neurosurgery;  Laterality: N/A;  . MAXIMUM ACCESS (MAS)POSTERIOR LUMBAR INTERBODY FUSION (PLIF) 2 LEVEL N/A 11/27/2014   Procedure: LUMBAR THREE-FOUR, LUMBAR FOUR-FIVE MAXIMUM ACCESS POSTERIOR LUMBAR INTERBODY FUSION;  Surgeon: Tia Alert, MD;  Location: [facility];  Service: Neurosurgery;  Laterality: N/A;  L3-4 L4-5 MAXIMUM ACCESS POSTERIOR LUMBAR INTERBODY FUSION  . VAGINAL HYSTERECTOMY  1970s     uterine prolapse    There were no vitals filed for this visit.  Subjective Assessment - 10/10/17 1021    Subjective  No changes, no falls.     Pertinent History  glaucoma, depression, anxiety, arthritis, back surgeries    Currently in Pain?  No/denies    Pain Score  0-No pain    Pain Onset  --             Vestibular Assessment - 10/10/17 1801      Balancemaster   Balancemaster  Sensory organization test       Sensory Organization Testing=50% compared to age/height normative value of 65%.     Patient demonstrated scores of:   90 for use of somatosensory feedback for balance (compared to age/height normative value of ~80),  77 for use of visual feedback for balance (compared to age/height normative value of ~74),   14 for use of vestibular feedback for balance (compared to age/height normative value of 50).    COG readings were slightly anterior, but WNL.  Patient tended to rely on ankle strategies to maintain balance more heavily and needs to improve her use of hip strategy to maintain balance.      Treatment: see HEP instructions. All exercises performed this date for education and tolerance prior to assigning to HEP   Do all balance exercises standing  with your back to the corner and a chair back in front of you for safety.  Feet Together (Compliant Surface) Head Motion - Eyes Open    With eyes open, standing on compliant surface: carpet initially, then add folded blanket or outdoor cushion; feet together, move head slowly: up and down 10. Left and right x 10 Repeat __1__ times per session. Do __1-2__ sessions per day.  Copyright  VHI. All rights reserved.      Feet Apart (Compliant Surface) Head Motion - Eyes Closed    Stand on compliant surface: with feet shoulder width apart. Close eyes for 30 seconds. Repeat __3__ times per session. Do _1-2___ sessions per day.  Copyright  VHI. All rights reserved.                     PT  Education - 10/10/17 1806    Education provided  Yes    Education Details  results of SOT; HEP    Person(s) Educated  Patient    Methods  Explanation;Demonstration;Handout    Comprehension  Verbalized understanding;Returned demonstration;Need further instruction       PT Short Term Goals - 10/10/17 1823      PT SHORT TERM GOAL #1   Title  Patient will be independent with HEP to address strength and balance. (Target all STGs 11/04/17)    Time  4    Period  Weeks    Status  New      PT SHORT TERM GOAL #2   Title  Patient will ambulate 500 ft indoors modifed independent with LRAD     Time  4    Period  Weeks    Status  New      PT SHORT TERM GOAL #3   Title  Complete balance assessment (either SOT, FGA or Berg) and set goals as appropriate.     Baseline  12/5 SOT completed    Time  1    Period  Weeks    Status  Achieved        PT Long Term Goals - 10/10/17 1824      PT LONG TERM GOAL #1   Title  Patient will be independent with updated HEP (Target all STGs 11/04/17)    Time  8    Period  Weeks    Status  New      PT LONG TERM GOAL #2   Title  Patient will ambulate 500 ft with LRAD on unlevel outdoor terr. ain    Time  8    Period  Weeks    Status  New      PT LONG TERM GOAL #3   Title  Patient's gait velocity will improve to 2.62 ft/sec to indicate safe ambulator in a commumity environment.     Time  8    Period  Weeks    Status  New      PT LONG TERM GOAL #4   Title  On repeat SOT will improve her composite score to >= norm for her age.    Baseline  12/5 50% compared to norm of 65%    Time  7    Period  Weeks    Status  New            Plan - 10/10/17 1808    Clinical Impression Statement  Completed SOT with overall results showing her vestibular system underperforming for her age with incr reliance on her vision and somatosensory systems. When  looking at the combined performance of all 3 systems, she scores ~23% lower in her overall ability to  maintain her balance compared to other women her age. Patient educated in results and HEP initiated. Patient can benefit from continued PT.     Rehab Potential  Good    PT Frequency  2x / week    PT Duration  8 weeks    PT Treatment/Interventions  ADLs/Self Care Home Management;DME Instruction;Gait training;Stair training;Functional mobility training;Therapeutic activities;Therapeutic exercise;Balance training;Neuromuscular re-education;Patient/family education;Passive range of motion;Vestibular;Visual/perceptual remediation/compensation    PT Next Visit Plan  check HEP from 12/5 and update as approp; do SVA with DVA; educate on VORx1 and add to HEP as approp; educate on proper use of cane (we have discussed rationale and open to using); work on wall bumps for improving hip strategy   Consulted and Agree with Plan of Care  Patient       Patient will benefit from skilled therapeutic intervention in order to improve the following deficits and impairments:  Abnormal gait, Cardiopulmonary status limiting activity, Decreased activity tolerance, Decreased coordination, Decreased balance, Decreased knowledge of use of DME, Decreased mobility, Decreased strength, Difficulty walking, Dizziness, Impaired sensation, Impaired vision/preception  Visit Diagnosis: Dizziness and giddiness  Unsteady gait  History of falling     Problem List Patient Active Problem List   Diagnosis Date Noted  . Community acquired pneumonia 10/26/2015  . Parapneumonic effusion   . Intractable back pain 04/09/2015  . Intractable pain 04/08/2015  . S/P lumbar spinal fusion 11/27/2014  . Right shoulder pain 09/02/2014  . Low back pain 09/02/2014  . Axillary pain 07/12/2014  . Wellness examination 05/22/2014  . Insomnia 05/14/2013  . Preventive measure 04/15/2012  . Depressive disorder 01/23/2012  . Urge incontinence 01/23/2012  . ARTHRITIS, HANDS, BILATERAL 03/25/2009  . HYPERCHOLESTEROLEMIA 02/27/2008  . MIGRAINE  HEADACHE 02/27/2008  . Essential hypertension 02/27/2008  . Anxiety state 08/01/2007    Zena AmosLynn P Sharunda Salmon, PT 10/10/2017, 6:26 PM  [facility] [address] [place], [place], [address] Phone: [phone]   Fax:  [phone]  Name: Melissa LivingsCarol A Gonzales MRN: [id number] Date of Birth: 07/06/1941

## 2017-10-10 NOTE — Patient Instructions (Addendum)
  Do all balance exercises standing with your back to the corner and a chair back in front of you for safety.  Feet Together (Compliant Surface) Head Motion - Eyes Open    With eyes open, standing on compliant surface: carpet initially, then add folded blanket or outdoor cushion; feet together, move head slowly: up and down 10. Left and right x 10 Repeat __1__ times per session. Do __1-2__ sessions per day.  Copyright  VHI. All rights reserved.      Feet Apart (Compliant Surface) Head Motion - Eyes Closed    Stand on compliant surface: with feet shoulder width apart. Close eyes for 30 seconds. Repeat __3__ times per session. Do _1-2___ sessions per day.  Copyright  VHI. All rights reserved.

## 2017-10-12 ENCOUNTER — Encounter: Payer: Self-pay | Admitting: Physical Therapy

## 2017-10-12 ENCOUNTER — Ambulatory Visit: Payer: Medicare Other | Admitting: Physical Therapy

## 2017-10-12 DIAGNOSIS — R42 Dizziness and giddiness: Secondary | ICD-10-CM

## 2017-10-12 DIAGNOSIS — Z9181 History of falling: Secondary | ICD-10-CM

## 2017-10-12 DIAGNOSIS — R2681 Unsteadiness on feet: Secondary | ICD-10-CM

## 2017-10-12 NOTE — Patient Instructions (Signed)
Weight Shift: Anterior / Posterior (Righting / Equilibrium)    With wall behind you (14-18 inches) and chair in front of you, slowly shift weight forward, arms back and hips forward over toes, until heels rise off floor. Return to starting position. Shift weight backward, arms forward and hips back over heels, until toes rise off floor. Hold each position __2__ seconds. Repeat _10___ times per session. Do __1__ sessions per day.  To increase challenge, stand on a pillow, folded blanket, etc  Copyright  VHI. All rights reserved.

## 2017-10-12 NOTE — Therapy (Signed)
[facility] [address] [place], [place], [address] Phone: [phone]   Fax:  [phone]  Physical Therapy Treatment  Patient Details  Name: Melissa Gonzales MRN: [id number] Date of Birth: [birth year] Referring Provider: Rodrigo RanPerini, Mark MD   Encounter Date: 10/12/2017  PT End of Session - 10/12/17 0947    Visit Number  3    Number of Visits  17 eval plus 16 visits    Date for PT Re-Evaluation  12/04/17    Authorization Type  [employer] MCR    Authorization Time Period  10/05/17-12/04/16    PT Start Time  0810    PT Stop Time  0850    PT Time Calculation (min)  40 min    Activity Tolerance  Patient tolerated treatment well    Behavior During Therapy  [facility] for tasks assessed/performed       Past Medical History:  Diagnosis Date  . Anxiety   . Arthritis   . Depression   . Diverticulosis   . GERD (gastroesophageal reflux disease)   . Glaucoma    Bilateral eyes  . Hyperlipidemia   . Hypertension   . Pneumonia    hx of  . Urgency of urination     Past Surgical History:  Procedure Laterality Date  . BACK SURGERY    . CHOLECYSTECTOMY  1993  . COLONOSCOPY    . EYE SURGERY     Cataract Surgery Bilateral, 5 years ago  . MAXIMUM ACCESS (MAS)POSTERIOR LUMBAR INTERBODY FUSION (PLIF) 1 LEVEL N/A 03/17/2015   Procedure: Lumbar five -sacral one Maximum access posterior lumbar interbody fusion/Lumbar two-three  Laminectomy and extension of instrumentation to Sacral-one;  Surgeon: Tia Alertavid S Jones, MD;  Location: [facility];  Service: Neurosurgery;  Laterality: N/A;  . MAXIMUM ACCESS (MAS)POSTERIOR LUMBAR INTERBODY FUSION (PLIF) 2 LEVEL N/A 11/27/2014   Procedure: LUMBAR THREE-FOUR, LUMBAR FOUR-FIVE MAXIMUM ACCESS POSTERIOR LUMBAR INTERBODY FUSION;  Surgeon: Tia Alertavid S Jones, MD;  Location: [facility];  Service: Neurosurgery;  Laterality: N/A;  L3-4 L4-5 MAXIMUM ACCESS POSTERIOR LUMBAR INTERBODY FUSION  . VAGINAL HYSTERECTOMY  1970s    uterine prolapse    There were no vitals filed for this visit.  Subjective Assessment - 10/12/17 0812    Subjective  No changes. Instead of HEP on carpet, she found a corner where she is on solid floor. At end of session, she mentioned she has noticed imbalance episodes occur when she is turning and stepping to change directions. Denies a sense of dizziness but a sense of imbalance.     Pertinent History  glaucoma, depression, anxiety, arthritis, back surgeries    Currently in Pain?  No/denies                      [facility] Treatment/Exercise - 10/12/17 0001      Ambulation/Gait   Ambulation/Gait  Yes    Ambulation/Gait Assistance  5: Supervision    Ambulation/Gait Assistance Details  vc for proper use of cane; initially step-to due to lt knee pain from prior fall (before began PT); attempted step-thru however pt did not feel comfortable     Ambulation Distance (Feet)  120 Feet 50, 40, 40    Assistive device  Straight cane    Gait Pattern  Step-to pattern;Step-through pattern;Decreased step length - right;Decreased weight shift to left;Antalgic    Ambulation Surface  Indoor    Curb  4: Min assist;6: Modified independent (Device/increase time)  Curb Details (indicate cue type and reason)  educated for lt knee pain (cane stays with "bad" leg) and also educated for sensory input to place cane up on curb first to give her input on how high to lift her foot          Balance Exercises - 10/12/17 0942      Balance Exercises: Standing   Wall Bumps  Hip    Wall Bumps-Hips  Eyes opened;Anterior/posterior;Foam/compliant surface;10 reps 2 sets; solid floor, blue airex with 1" foam on tops    Stepping Strategy  Posterior;Foam/compliant surface;10 reps blue mat in // bars    Balance Beam  black, shoulder width stance, eliciting hip reaction and if ineffectiive, then step        PT Education - 10/12/17 0946    Education provided  Yes    Education Details  see HEP     Person(s) Educated  Patient    Methods  Explanation;Demonstration;Handout    Comprehension  Verbalized understanding;Returned demonstration;Need further instruction       PT Short Term Goals - 10/10/17 1823      PT SHORT TERM GOAL #1   Title  Patient will be independent with HEP to address strength and balance. (Target all STGs 11/04/17)    Time  4    Period  Weeks    Status  New      PT SHORT TERM GOAL #2   Title  Patient will ambulate 500 ft indoors modifed independent with LRAD     Time  4    Period  Weeks    Status  New      PT SHORT TERM GOAL #3   Title  Complete balance assessment (either SOT, FGA or Berg) and set goals as appropriate.     Baseline  12/5 SOT completed    Time  1    Period  Weeks    Status  Achieved        PT Long Term Goals - 10/10/17 1824      PT LONG TERM GOAL #1   Title  Patient will be independent with updated HEP (Target all STGs 11/04/17)    Time  8    Period  Weeks    Status  New      PT LONG TERM GOAL #2   Title  Patient will ambulate 500 ft with LRAD on unlevel outdoor terr. ain    Time  8    Period  Weeks    Status  New      PT LONG TERM GOAL #3   Title  Patient's gait velocity will improve to 2.62 ft/sec to indicate safe ambulator in a commumity environment.     Time  8    Period  Weeks    Status  New      PT LONG TERM GOAL #4   Title  On repeat SOT will improve her composite score to >= norm for her age.    Baseline  12/5 50% compared to norm of 65%    Time  7    Period  Weeks    Status  New            Plan - 10/12/17 0947    Clinical Impression Statement  Session focused on training hip strategy for recovering balance with then progression to stepping strategy. Patient initially hesitant to allow herself to lean outside her BOS to practice stepping, however with practice she did well. Session also focused on  education on use of straight cane (both for relieving lt knee pain and for increased sensory input).  Patient asking appropriate questions and making progress towards goals.     Rehab Potential  Good    PT Frequency  2x / week    PT Duration  8 weeks    PT Treatment/Interventions  ADLs/Self Care Home Management;DME Instruction;Gait training;Stair training;Functional mobility training;Therapeutic activities;Therapeutic exercise;Balance training;Neuromuscular re-education;Patient/family education;Passive range of motion;Vestibular;Visual/perceptual remediation/compensation    PT Next Visit Plan  check HEP from 12/7; do SVA with DVA; educate on VORx1 and add to HEP as approp; add 180 turns for habituation;     Consulted and Agree with Plan of Care  Patient       Patient will benefit from skilled therapeutic intervention in order to improve the following deficits and impairments:  Abnormal gait, Cardiopulmonary status limiting activity, Decreased activity tolerance, Decreased coordination, Decreased balance, Decreased knowledge of use of DME, Decreased mobility, Decreased strength, Difficulty walking, Dizziness, Impaired sensation, Impaired vision/preception  Visit Diagnosis: Dizziness and giddiness  Unsteady gait  History of falling     Problem List Patient Active Problem List   Diagnosis Date Noted  . Community acquired pneumonia 10/26/2015  . Parapneumonic effusion   . Intractable back pain 04/09/2015  . Intractable pain 04/08/2015  . S/P lumbar spinal fusion 11/27/2014  . Right shoulder pain 09/02/2014  . Low back pain 09/02/2014  . Axillary pain 07/12/2014  . Wellness examination 05/22/2014  . Insomnia 05/14/2013  . Preventive measure 04/15/2012  . Depressive disorder 01/23/2012  . Urge incontinence 01/23/2012  . ARTHRITIS, HANDS, BILATERAL 03/25/2009  . HYPERCHOLESTEROLEMIA 02/27/2008  . MIGRAINE HEADACHE 02/27/2008  . Essential hypertension 02/27/2008  . Anxiety state 08/01/2007    Zena AmosLynn P Amay Mijangos , PT 10/12/2017, 9:57 AM  [facility] [address] [place], [place], [address] Phone: [phone]   Fax:  [phone]  Name: Melissa Gonzales MRN: [id number] Date of Birth: 1941/02/08

## 2017-10-16 ENCOUNTER — Ambulatory Visit: Payer: Medicare Other | Admitting: Physical Therapy

## 2017-10-18 ENCOUNTER — Ambulatory Visit: Payer: Medicare Other | Admitting: Physical Therapy

## 2017-10-18 ENCOUNTER — Encounter: Payer: Self-pay | Admitting: Physical Therapy

## 2017-10-18 DIAGNOSIS — R42 Dizziness and giddiness: Secondary | ICD-10-CM | POA: Diagnosis not present

## 2017-10-18 NOTE — Patient Instructions (Signed)
  For safety, perform standing exercises close to a counter, wall, corner, or next to someone.  Gaze Stabilization: Tip Card  1.Target must remain in focus, not blurry, and appear stationary while head is in motion. 2.Perform exercises with small head movements (45 to either side of midline). 3.Increase speed of head motion so long as target is in focus. 4.If you wear eyeglasses, be sure you can see target through lens (therapist will give specific instructions for bifocal / progressive lenses). 5.These exercises may provoke dizziness or nausea. Work through these symptoms. If too dizzy, slow head movement slightly. Rest between each exercise. 6.Exercises demand concentration; avoid distractions.  Copyright  VHI. All rights reserved.     Special Instructions: Exercises should bring on mild to moderate symptoms of imbalance that resolves within 30 minutes of completing exercises. If symptoms are lasting longer than 30 minutes, modify your exercises by:  >decreasing the # of times you complete each activity >ensuring your symptoms return to baseline before moving onto the next exercise >dividing up exercises so you do not do them all in one session, but multiple short sessions throughout the day >doing them once a day until symptoms improve       Gaze Stabilization - Standing Feet Apart   Feet shoulder width apart, keeping eyes on target on wall 3 feet away, tilt head down slightly and move head side to side for 30 seconds. Repeat while moving head up and down for 30 seconds. Repeat two times each direction. *Work up to tolerating 60 seconds, as able. Do 2-3 sessions per day.   Copyright  VHI. All rights reserved.     180 (Half) Turns    Stand in the hall with your back to one wall. Make a right half turn and your back will be next to the opposite wall. Turn again to the right to go back to the wall where you started.  Repeat sequence __5__ times (will build up to 10  times). Then repeat turning to the left x 5 reps. Do __1__ sessions per day.   Copyright  VHI. All rights reserved.
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
## 2017-10-22 ENCOUNTER — Ambulatory Visit: Payer: Medicare Other | Admitting: Physical Therapy

## 2017-10-22 ENCOUNTER — Encounter: Payer: Self-pay | Admitting: Physical Therapy

## 2017-10-22 DIAGNOSIS — Z9181 History of falling: Secondary | ICD-10-CM

## 2017-10-22 DIAGNOSIS — R42 Dizziness and giddiness: Secondary | ICD-10-CM | POA: Diagnosis not present

## 2017-10-22 DIAGNOSIS — M6281 Muscle weakness (generalized): Secondary | ICD-10-CM

## 2017-10-22 DIAGNOSIS — R2681 Unsteadiness on feet: Secondary | ICD-10-CM

## 2017-10-22 NOTE — Therapy (Signed)
[facility] [address] [place], [place], [address] Phone: [phone]   Fax:  [phone]  Physical Therapy Treatment  Patient Details  Name: Melissa Gonzales MRN: [id number] Date of Birth: [birth year] Referring Provider: Rodrigo RanPerini, Mark MD   Encounter Date: 10/22/2017  Melissa Gonzales End of Session - 10/22/17 2007    Visit Number  5    Number of Visits  17 eval plus 16 visits    Date for Melissa Gonzales Re-Evaluation  12/04/17    Authorization Type  [employer] MCR    Authorization Time Period  10/05/17-12/04/16    Melissa Gonzales Start Time  1105    Melissa Gonzales Stop Time  1148    Melissa Gonzales Time Calculation (min)  43 min    Activity Tolerance  Patient tolerated treatment well    Behavior During Therapy  [facility] for tasks assessed/performed       Past Medical History:  Diagnosis Date  . Anxiety   . Arthritis   . Depression   . Diverticulosis   . GERD (gastroesophageal reflux disease)   . Glaucoma    Bilateral eyes  . Hyperlipidemia   . Hypertension   . Pneumonia    hx of  . Urgency of urination     Past Surgical History:  Procedure Laterality Date  . BACK SURGERY    . CHOLECYSTECTOMY  1993  . COLONOSCOPY    . EYE SURGERY     Cataract Surgery Bilateral, 5 years ago  . MAXIMUM ACCESS (MAS)POSTERIOR LUMBAR INTERBODY FUSION (PLIF) 1 LEVEL N/A 03/17/2015   Procedure: Lumbar five -sacral one Maximum access posterior lumbar interbody fusion/Lumbar two-three  Laminectomy and extension of instrumentation to Sacral-one;  Surgeon: Tia Alertavid S Jones, MD;  Location: [facility];  Service: Neurosurgery;  Laterality: N/A;  . MAXIMUM ACCESS (MAS)POSTERIOR LUMBAR INTERBODY FUSION (PLIF) 2 LEVEL N/A 11/27/2014   Procedure: LUMBAR THREE-FOUR, LUMBAR FOUR-FIVE MAXIMUM ACCESS POSTERIOR LUMBAR INTERBODY FUSION;  Surgeon: Tia Alertavid S Jones, MD;  Location: [facility];  Service: Neurosurgery;  Laterality: N/A;  L3-4 L4-5 MAXIMUM ACCESS POSTERIOR LUMBAR INTERBODY FUSION  . VAGINAL HYSTERECTOMY  1970s    uterine prolapse    There were no vitals filed for this visit.  Subjective Assessment - 10/22/17 1108    Subjective  Woke up with blood soaking her pillow and can't figure out where it came from. Did not fall or find anything that could have fallen on her.     Pertinent History  glaucoma, depression, anxiety, arthritis, back surgeries    Currently in Pain?  Yes    Pain Score  4     Pain Location  Foot    Pain Orientation  Right    Pain Descriptors / Indicators  Aching;Sore    Pain Type  Acute pain    Pain Radiating Towards  bottom of foot    Pain Onset  In the past 7 days    Pain Frequency  Intermittent                      [facility] Adult Melissa Gonzales Treatment/Exercise - 10/22/17 2000      Ambulation/Gait   Ambulation/Gait Assistance  5: Supervision    Ambulation/Gait Assistance Details  for safety    Ambulation Distance (Feet)  200 Feet    Assistive device  Straight cane;None    Gait Pattern  Step-through pattern;Decreased stride length;Antalgic    Ambulation Surface  Indoor    Gait Comments  with head turns:horizontal, vertical, bil  diagonal          Balance Exercises - 10/22/17 2003      Balance Exercises: Standing   Standing Eyes Closed  Wide (BOA);Foam/compliant surface;30 secs;2 reps    Tandem Stance  Eyes open;Foam/compliant surface;Intermittent upper extremity support    SLS  Eyes open;Upper extremity support 1;3 reps    SLS with Vectors  Solid surface single then double cone taps    Wall Bumps  Hip    Wall Bumps-Hips  Eyes opened;Anterior/posterior;10 reps    Balance Beam  blue, shoulder width; step on/off    Retro Gait  3 reps    Step Over Hurdles / Cones  forward, backward, sideways          Melissa Gonzales Short Term Goals - 10/10/17 1823      Melissa Gonzales SHORT TERM GOAL #1   Title  Patient will be independent with HEP to address strength and balance. (Target all STGs 11/04/17)    Time  4    Period  Weeks    Status  New      Melissa Gonzales SHORT TERM GOAL #2   Title   Patient will ambulate 500 ft indoors modifed independent with LRAD     Time  4    Period  Weeks    Status  New      Melissa Gonzales SHORT TERM GOAL #3   Title  Complete balance assessment (either SOT, FGA or Berg) and set goals as appropriate.     Baseline  12/5 SOT completed    Time  1    Period  Weeks    Status  Achieved        Melissa Gonzales Long Term Goals - 10/10/17 1824      Melissa Gonzales LONG TERM GOAL #1   Title  Patient will be independent with updated HEP (Target all STGs 11/04/17)    Time  8    Period  Weeks    Status  New      Melissa Gonzales LONG TERM GOAL #2   Title  Patient will ambulate 500 ft with LRAD on unlevel outdoor terr. ain    Time  8    Period  Weeks    Status  New      Melissa Gonzales LONG TERM GOAL #3   Title  Patient's gait velocity will improve to 2.62 ft/sec to indicate safe ambulator in a commumity environment.     Time  8    Period  Weeks    Status  New      Melissa Gonzales LONG TERM GOAL #4   Title  On repeat SOT will improve her composite score to >= norm for her age.    Baseline  12/5 50% compared to norm of 65%    Time  7    Period  Weeks    Status  New            Plan - 10/22/17 2009    Clinical Impression Statement  Session focused on vestibular rehab, balance, and gait training. Patient continues to progress well (although slower than she would like). Anticipate she will continue to improve with Melissa Gonzales intervention.     Rehab Potential  Good    Melissa Gonzales Frequency  2x / week    Melissa Gonzales Duration  8 weeks    Melissa Gonzales Treatment/Interventions  ADLs/Self Care Home Management;DME Instruction;Gait training;Stair training;Functional mobility training;Therapeutic activities;Therapeutic exercise;Balance training;Neuromuscular re-education;Patient/family education;Passive range of motion;Vestibular;Visual/perceptual remediation/compensation    Melissa Gonzales Next Visit Plan  check HEP 12/13 (VORx1, 1/2  turns); ?assess for BPPV; gait with head turns    Consulted and Agree with Plan of Care  Patient       Patient will benefit from skilled  therapeutic intervention in order to improve the following deficits and impairments:  Abnormal gait, Cardiopulmonary status limiting activity, Decreased activity tolerance, Decreased coordination, Decreased balance, Decreased knowledge of use of DME, Decreased mobility, Decreased strength, Difficulty walking, Dizziness, Impaired sensation, Impaired vision/preception  Visit Diagnosis: Dizziness and giddiness  Unsteady gait  History of falling  Muscle weakness (generalized)     Problem List Patient Active Problem List   Diagnosis Date Noted  . Community acquired pneumonia 10/26/2015  . Parapneumonic effusion   . Intractable back pain 04/09/2015  . Intractable pain 04/08/2015  . S/P lumbar spinal fusion 11/27/2014  . Right shoulder pain 09/02/2014  . Low back pain 09/02/2014  . Axillary pain 07/12/2014  . Wellness examination 05/22/2014  . Insomnia 05/14/2013  . Preventive measure 04/15/2012  . Depressive disorder 01/23/2012  . Urge incontinence 01/23/2012  . ARTHRITIS, HANDS, BILATERAL 03/25/2009  . HYPERCHOLESTEROLEMIA 02/27/2008  . MIGRAINE HEADACHE 02/27/2008  . Essential hypertension 02/27/2008  . Anxiety state 08/01/2007    Melissa Gonzales, Melissa Gonzales 10/22/2017, 8:17 PM  [facility] [address] [place], [place], [address] Phone: [phone]   Fax:  [phone]  Name: Melissa Gonzales MRN: [id number] Date of Birth: [birth year]

## 2017-10-24 ENCOUNTER — Ambulatory Visit: Payer: Medicare Other | Admitting: Physical Therapy

## 2017-10-31 ENCOUNTER — Encounter: Payer: Self-pay | Admitting: Physical Therapy

## 2017-10-31 ENCOUNTER — Ambulatory Visit: Payer: Medicare Other | Admitting: Physical Therapy

## 2017-10-31 DIAGNOSIS — R42 Dizziness and giddiness: Secondary | ICD-10-CM

## 2017-10-31 DIAGNOSIS — R2681 Unsteadiness on feet: Secondary | ICD-10-CM

## 2017-10-31 NOTE — Therapy (Signed)
[facility] [address] [place], [place], [address] Phone: [phone]   Fax:  [phone]  Physical Therapy Treatment  Patient Details  Name: Melissa Gonzales MRN: [id number] Date of Birth: July 31, 1941 Referring Provider: Rodrigo RanPerini, Mark MD   Encounter Date: 10/31/2017  PT End of Session - 10/31/17 1207    Visit Number  6    Number of Visits  17 eval plus 16 visits    Date for PT Re-Evaluation  12/04/17    Authorization Type  [employer] MCR    Authorization Time Period  10/05/17-12/04/16    PT Start Time  1017    PT Stop Time  1101    PT Time Calculation (min)  44 min    Activity Tolerance  Patient tolerated treatment well    Behavior During Therapy  [facility] for tasks assessed/performed       Past Medical History:  Diagnosis Date  . Anxiety   . Arthritis   . Depression   . Diverticulosis   . GERD (gastroesophageal reflux disease)   . Glaucoma    Bilateral eyes  . Hyperlipidemia   . Hypertension   . Pneumonia    hx of  . Urgency of urination     Past Surgical History:  Procedure Laterality Date  . BACK SURGERY    . CHOLECYSTECTOMY  1993  . COLONOSCOPY    . EYE SURGERY     Cataract Surgery Bilateral, 5 years ago  . MAXIMUM ACCESS (MAS)POSTERIOR LUMBAR INTERBODY FUSION (PLIF) 1 LEVEL N/A 03/17/2015   Procedure: Lumbar five -sacral one Maximum access posterior lumbar interbody fusion/Lumbar two-three  Laminectomy and extension of instrumentation to Sacral-one;  Surgeon: Tia Alertavid S Jones, MD;  Location: [facility];  Service: Neurosurgery;  Laterality: N/A;  . MAXIMUM ACCESS (MAS)POSTERIOR LUMBAR INTERBODY FUSION (PLIF) 2 LEVEL N/A 11/27/2014   Procedure: LUMBAR THREE-FOUR, LUMBAR FOUR-FIVE MAXIMUM ACCESS POSTERIOR LUMBAR INTERBODY FUSION;  Surgeon: Tia Alertavid S Jones, MD;  Location: [facility];  Service: Neurosurgery;  Laterality: N/A;  L3-4 L4-5 MAXIMUM ACCESS POSTERIOR LUMBAR INTERBODY FUSION  . VAGINAL HYSTERECTOMY  1970s    uterine prolapse    There were no vitals filed for this visit.  Subjective Assessment - 10/31/17 1018    Subjective  States she has not had another episode of bloody pillow. She is on a mild anticoagulant (it's a side effect of her bladder medicine). She did have her arm start bleeding while out shopping (and still having to dress it due to oozing).    Currently in Pain?  No/denies                       Vestibular Treatment/Exercise - 10/31/17 1202      Vestibular Treatment/Exercise   Vestibular Treatment Provided  Gaze    Gaze Exercises  X1 Viewing Horizontal;X1 Viewing Vertical      X1 Viewing Horizontal   Foot Position  feet together, partial stagger lt/rt on floor and then on cushion    Time  -- 30 sec each    Comments  no imbalance or feeling of off-balance until 3rd position on foam and feet staggered      X1 Viewing Vertical   Foot Position  partial stagger on foam     Time  -- 30 sec    Reps  1         Balance Exercises - 10/31/17 1100      Balance Exercises: Standing  Stepping Strategy  Anterior;Posterior off balance beam with perturbations    Rockerboard  Anterior/posterior;EO;EC;30 seconds with perturbations, ft together, partial stagger    Balance Beam  black, horizontal emphasizing hip strategy and step; blue walking sideways and tandem    Gait with Head Turns  Forward ooking for playing cards along 3 hallways; required ~5 trips    Tandem Gait  Forward;Retro;4 reps in // bars    Retro Gait  -- 20 ft        PT Education - 10/31/17 1206    Education provided  Yes    Education Details  update VOR x1 at home to partial staggered stance lt and rt on foam x 30 seconds    Person(s) Educated  Patient    Methods  Explanation;Demonstration    Comprehension  Verbalized understanding;Returned demonstration       PT Short Term Goals - 10/10/17 1823      PT SHORT TERM GOAL #1   Title  Patient will be independent with HEP to address strength  and balance. (Target all STGs 11/04/17)    Time  4    Period  Weeks    Status  New      PT SHORT TERM GOAL #2   Title  Patient will ambulate 500 ft indoors modifed independent with LRAD     Time  4    Period  Weeks    Status  New      PT SHORT TERM GOAL #3   Title  Complete balance assessment (either SOT, FGA or Berg) and set goals as appropriate.     Baseline  12/5 SOT completed    Time  1    Period  Weeks    Status  Achieved        PT Long Term Goals - 10/10/17 1824      PT LONG TERM GOAL #1   Title  Patient will be independent with updated HEP (Target all STGs 11/04/17)    Time  8    Period  Weeks    Status  New      PT LONG TERM GOAL #2   Title  Patient will ambulate 500 ft with LRAD on unlevel outdoor terr. ain    Time  8    Period  Weeks    Status  New      PT LONG TERM GOAL #3   Title  Patient's gait velocity will improve to 2.62 ft/sec to indicate safe ambulator in a commumity environment.     Time  8    Period  Weeks    Status  New      PT LONG TERM GOAL #4   Title  On repeat SOT will improve her composite score to >= norm for her age.    Baseline  12/5 50% compared to norm of 65%    Time  7    Period  Weeks    Status  New            Plan - 10/31/17 1208    Clinical Impression Statement  Session focused on balance training, also incorporated into dynamic gait activities. Patient overall doing very well. Discussed she continues to use her SPC in community (and sometimes at home), which gives her just enough support to not lose her balance. Discussed need for continued use due to bil decr sensation in feet. Patient in agreement. Overall, pt has made good progress. Will plan to check STGs and LTGs next session  and discuss further PT moving forward based on results.     Rehab Potential  Good    PT Frequency  2x / week    PT Duration  8 weeks    PT Treatment/Interventions  ADLs/Self Care Home Management;DME Instruction;Gait training;Stair  training;Functional mobility training;Therapeutic activities;Therapeutic exercise;Balance training;Neuromuscular re-education;Patient/family education;Passive range of motion;Vestibular;Visual/perceptual remediation/compensation    PT Next Visit Plan  check STGs and LTGs (doing very well) and decide PT plan (?decr to 1x/wk vs plan for discharge)    Consulted and Agree with Plan of Care  Patient       Patient will benefit from skilled therapeutic intervention in order to improve the following deficits and impairments:  Abnormal gait, Cardiopulmonary status limiting activity, Decreased activity tolerance, Decreased coordination, Decreased balance, Decreased knowledge of use of DME, Decreased mobility, Decreased strength, Difficulty walking, Dizziness, Impaired sensation, Impaired vision/preception  Visit Diagnosis: Dizziness and giddiness  Unsteady gait     Problem List Patient Active Problem List   Diagnosis Date Noted  . Community acquired pneumonia 10/26/2015  . Parapneumonic effusion   . Intractable back pain 04/09/2015  . Intractable pain 04/08/2015  . S/P lumbar spinal fusion 11/27/2014  . Right shoulder pain 09/02/2014  . Low back pain 09/02/2014  . Axillary pain 07/12/2014  . Wellness examination 05/22/2014  . Insomnia 05/14/2013  . Preventive measure 04/15/2012  . Depressive disorder 01/23/2012  . Urge incontinence 01/23/2012  . ARTHRITIS, HANDS, BILATERAL 03/25/2009  . HYPERCHOLESTEROLEMIA 02/27/2008  . MIGRAINE HEADACHE 02/27/2008  . Essential hypertension 02/27/2008  . Anxiety state 08/01/2007    Zena Amos, PT 10/31/2017, 12:12 PM  [facility] [address] [place], [place], [address] Phone: [phone]   Fax:  [phone]  Name: Melissa Gonzales MRN: [id number] Date of Birth: August 06, 1941

## 2017-11-02 ENCOUNTER — Encounter: Payer: Self-pay | Admitting: Physical Therapy

## 2017-11-02 ENCOUNTER — Ambulatory Visit: Payer: Medicare Other | Admitting: Physical Therapy

## 2017-11-02 DIAGNOSIS — R42 Dizziness and giddiness: Secondary | ICD-10-CM

## 2017-11-02 DIAGNOSIS — M6281 Muscle weakness (generalized): Secondary | ICD-10-CM

## 2017-11-02 DIAGNOSIS — R2681 Unsteadiness on feet: Secondary | ICD-10-CM

## 2017-11-02 NOTE — Therapy (Signed)
[facility] [address] [place], [place], [address] Phone: [phone]   Fax:  [phone]  Physical Therapy Treatment  Patient Details  Name: Melissa Gonzales MRN: [id number] Date of Birth: 07-23-1941 Referring Provider: Crist Infante MD   Encounter Date: 11/02/2017  PT End of Session - 11/02/17 2016    Visit Number  7    Number of Visits  17 eval plus 16 visits    Date for PT Re-Evaluation  12/04/17    Authorization Type  UHC MCR    Authorization Time Period  10/05/17-12/04/16    PT Start Time  1111 late arrival    PT Stop Time  1147    PT Time Calculation (min)  36 min    Activity Tolerance  Patient tolerated treatment well    Behavior During Therapy  [facility] for tasks assessed/performed       Past Medical History:  Diagnosis Date  . Anxiety   . Arthritis   . Depression   . Diverticulosis   . GERD (gastroesophageal reflux disease)   . Glaucoma    Bilateral eyes  . Hyperlipidemia   . Hypertension   . Pneumonia    hx of  . Urgency of urination     Past Surgical History:  Procedure Laterality Date  . BACK SURGERY    . CHOLECYSTECTOMY  1993  . COLONOSCOPY    . EYE SURGERY     Cataract Surgery Bilateral, 5 years ago  . MAXIMUM ACCESS (MAS)POSTERIOR LUMBAR INTERBODY FUSION (PLIF) 1 LEVEL N/A 03/17/2015   Procedure: Lumbar five -sacral one Maximum access posterior lumbar interbody fusion/Lumbar two-three  Laminectomy and extension of instrumentation to [place];  Surgeon: Eustace Moore, MD;  Location: [place] [facility];  Service: Neurosurgery;  Laterality: N/A;  . MAXIMUM ACCESS (MAS)POSTERIOR LUMBAR INTERBODY FUSION (PLIF) 2 LEVEL N/A 11/27/2014   Procedure: LUMBAR THREE-FOUR, LUMBAR FOUR-FIVE MAXIMUM ACCESS POSTERIOR LUMBAR INTERBODY FUSION;  Surgeon: Eustace Moore, MD;  Location: [place] [facility];  Service: Neurosurgery;  Laterality: N/A;  L3-4 L4-5 MAXIMUM ACCESS POSTERIOR LUMBAR INTERBODY FUSION  . VAGINAL  HYSTERECTOMY  1970s   uterine prolapse    There were no vitals filed for this visit.  Subjective Assessment - 11/02/17 2010    Subjective  Feeling very stiff today.     Currently in Pain?  No/denies         [facility] PT Assessment - 11/02/17 0001      Standardized Balance Assessment   Balance Master Testing  Sensory Organization Test  Sensory Organization Testing=46% compared to age/height normative value of 65%.     Patient demonstrated scores of:   93 for use of somatosensory feedback for balance (compared to age/height normative value of 80),  76 for use of visual feedback for balance (compared to age/height normative value of 69),   1 for use of vestibular feedback for balance (compared to age/height normative value of 50).   COG readings were all WNL except 1 of 18.       Functional Gait  Assessment   Gait assessed   Yes    Gait Level Surface  Walks 20 ft in less than 5.5 sec, no assistive devices, good speed, no evidence for imbalance, normal gait pattern, deviates no more than 6 in outside of the 12 in walkway width.    Change in Gait Speed  Able to smoothly change walking speed without loss of balance or gait deviation. Deviate no more than 6 in  outside of the 12 in walkway width.    Gait with Horizontal Head Turns  Performs head turns smoothly with no change in gait. Deviates no more than 6 in outside 12 in walkway width    Gait with Vertical Head Turns  Performs head turns with no change in gait. Deviates no more than 6 in outside 12 in walkway width.    Gait and Pivot Turn  Pivot turns safely within 3 sec and stops quickly with no loss of balance.    Step Over Obstacle  Is able to step over 2 stacked shoe boxes taped together (9 in total height) without changing gait speed. No evidence of imbalance.    Gait with Narrow Base of Support  Ambulates less than 4 steps heel to toe or cannot perform without assistance.    Gait with Eyes Closed  Walks 20 ft, uses assistive device,  slower speed, mild gait deviations, deviates 6-10 in outside 12 in walkway width. Ambulates 20 ft in less than 9 sec but greater than 7 sec.    Ambulating Backwards  Walks 20 ft, uses assistive device, slower speed, mild gait deviations, deviates 6-10 in outside 12 in walkway width.    Steps  Alternating feet, must use rail.    Total Score  24                  [facility] Adult PT Treatment/Exercise - 11/02/17 0001      Ambulation/Gait   Gait velocity  32.8/8.63=3.8 ft/sec fastest but safe 32.8/6.03=5.44 ft/sec      Exercises   Exercises  Knee/Hip      Knee/Hip Exercises: Aerobic   Nustep  L4 x 4 minutes (warm-up due to incr stiffness this date)             PT Education - 11/02/17 2015    Education provided  Yes    Education Details  mixed results of assessments this date (some improved, some worse)    Person(s) Educated  Patient    Methods  Explanation;Handout    Comprehension  Verbalized understanding       PT Short Term Goals - 11/02/17 2018      PT SHORT TERM GOAL #1   Title  Patient will be independent with HEP to address strength and balance. (Target all STGs 11/04/17)    Time  4    Period  Weeks    Status  Achieved      PT SHORT TERM GOAL #2   Title  Patient will ambulate 500 ft indoors modifed independent with LRAD     Time  4    Period  Weeks    Status  Achieved      PT SHORT TERM GOAL #3   Title  Complete balance assessment (either SOT, FGA or Berg) and set goals as appropriate.     Baseline  12/5 SOT completed; 12/28 FGA 24/30    Time  1    Period  Weeks    Status  Achieved        PT Long Term Goals - 11/02/17 2019      PT LONG TERM GOAL #1   Title  Patient will be independent with updated HEP (Target all STGs 11/04/17)    Time  8    Period  Weeks    Status  On-going      PT LONG TERM GOAL #2   Title  Patient will ambulate 500 ft with LRAD on unlevel outdoor terr.   ain    Time  8    Period  Weeks    Status  New      PT LONG TERM  GOAL #3   Title  Patient's gait velocity will improve to 2.62 ft/sec to indicate safe ambulator in a commumity environment.     Baseline  11/02/17 3.80 ft/sec    Time  8    Period  Weeks    Status  Achieved      PT LONG TERM GOAL #4   Title  On repeat SOT will improve her composite score to >= norm for her age.    Baseline  12/5 50% compared to norm of 65%; 12/28 composite 46 compared to norm of 65%    Time  7    Period  Weeks    Status  Not Met            Plan - 11/02/17 2026    Clinical Impression Statement  Assessed STGs (and some LTGs based on pt's excellent progress this far) with pt meeting 3 of 3 STGs and 1 LTG. Despite significant improvement in her gait velocity and score of 24/30 on FGA, her Sensory Organization Testing scores actually were lower. She did have one more "fall" on today's assessment compared to previous test, which likely accounts for the difference. Overall, patient reports she feels more steady with fewer episodes of imbalance during a typical day. She continues to find she feels off balance with quick turns or with prolonged static standing. Will plan to update her HEP to assure it is appropriately challenging on her next visit.     Rehab Potential  Good    PT Frequency  2x / week    PT Duration  8 weeks    PT Treatment/Interventions  ADLs/Self Care Home Management;DME Instruction;Gait training;Stair training;Functional mobility training;Therapeutic activities;Therapeutic exercise;Balance training;Neuromuscular re-education;Patient/family education;Passive range of motion;Vestibular;Visual/perceptual remediation/compensation    PT Next Visit Plan  check for need to update or add to her HEP; discuss ?decr to 1x/wk vs plan for discharge    Consulted and Agree with Plan of Care  Patient       Patient will benefit from skilled therapeutic intervention in order to improve the following deficits and impairments:  Abnormal gait, Cardiopulmonary status limiting  activity, Decreased activity tolerance, Decreased coordination, Decreased balance, Decreased knowledge of use of DME, Decreased mobility, Decreased strength, Difficulty walking, Dizziness, Impaired sensation, Impaired vision/preception  Visit Diagnosis: Dizziness and giddiness  Unsteady gait  Muscle weakness (generalized)     Problem List Patient Active Problem List   Diagnosis Date Noted  . Community acquired pneumonia 10/26/2015  . Parapneumonic effusion   . Intractable back pain 04/09/2015  . Intractable pain 04/08/2015  . S/P lumbar spinal fusion 11/27/2014  . Right shoulder pain 09/02/2014  . Low back pain 09/02/2014  . Axillary pain 07/12/2014  . Wellness examination 05/22/2014  . Insomnia 05/14/2013  . Preventive measure 04/15/2012  . Depressive disorder 01/23/2012  . Urge incontinence 01/23/2012  . ARTHRITIS, HANDS, BILATERAL 03/25/2009  . HYPERCHOLESTEROLEMIA 02/27/2008  . MIGRAINE HEADACHE 02/27/2008  . Essential hypertension 02/27/2008  . Anxiety state 08/01/2007    Rexanne Mano, PT 11/02/2017, 8:39 PM  [facility] [address] [place], [place], [address] Phone: [phone]   Fax:  [phone]  Name: DAWNE CASALI MRN: [id number] Date of Birth: 09-05-1941

## 2017-11-07 ENCOUNTER — Ambulatory Visit: Payer: Medicare Other | Admitting: Physical Therapy

## 2017-11-09 ENCOUNTER — Ambulatory Visit: Payer: Medicare Other | Admitting: Physical Therapy

## 2017-11-12 ENCOUNTER — Ambulatory Visit: Payer: Medicare Other | Admitting: Physical Therapy

## 2017-11-15 ENCOUNTER — Ambulatory Visit: Payer: Medicare Other | Admitting: Physical Therapy

## 2017-11-20 ENCOUNTER — Ambulatory Visit: Payer: Medicare Other | Attending: Internal Medicine | Admitting: Physical Therapy

## 2017-11-20 ENCOUNTER — Encounter: Payer: Self-pay | Admitting: Physical Therapy

## 2017-11-20 DIAGNOSIS — R2681 Unsteadiness on feet: Secondary | ICD-10-CM | POA: Insufficient documentation

## 2017-11-20 DIAGNOSIS — M6281 Muscle weakness (generalized): Secondary | ICD-10-CM | POA: Diagnosis present

## 2017-11-20 NOTE — Therapy (Signed)
[facility] [address] [place], [place], [address] Phone: [phone]   Fax:  [phone]  Physical Therapy Treatment  Patient Details  Name: Melissa Gonzales MRN: [id number] Date of Birth: [birth year] Referring Provider: Crist Infante MD   Encounter Date: 11/20/2017  PT End of Session - 11/20/17 2021    Visit Number  8    Number of Visits  17 eval plus 16 visits    Date for PT Re-Evaluation  12/04/17    Authorization Type  UHC MCR    Authorization Time Period  10/05/17-12/04/16    PT Start Time  1108 pt arrived late    PT Stop Time  1145    PT Time Calculation (min)  37 min    Activity Tolerance  Patient tolerated treatment well    Behavior During Therapy  [facility] for tasks assessed/performed       Past Medical History:  Diagnosis Date  . Anxiety   . Arthritis   . Depression   . Diverticulosis   . GERD (gastroesophageal reflux disease)   . Glaucoma    Bilateral eyes  . Hyperlipidemia   . Hypertension   . Pneumonia    hx of  . Urgency of urination     Past Surgical History:  Procedure Laterality Date  . BACK SURGERY    . CHOLECYSTECTOMY  1993  . COLONOSCOPY    . EYE SURGERY     Cataract Surgery Bilateral, 5 years ago  . MAXIMUM ACCESS (MAS)POSTERIOR LUMBAR INTERBODY FUSION (PLIF) 1 LEVEL N/A 03/17/2015   Procedure: Lumbar five -sacral one Maximum access posterior lumbar interbody fusion/Lumbar two-three  Laminectomy and extension of instrumentation to [place];  Surgeon: Eustace Moore, MD;  Location: [facility];  Service: Neurosurgery;  Laterality: N/A;  . MAXIMUM ACCESS (MAS)POSTERIOR LUMBAR INTERBODY FUSION (PLIF) 2 LEVEL N/A 11/27/2014   Procedure: LUMBAR THREE-FOUR, LUMBAR FOUR-FIVE MAXIMUM ACCESS POSTERIOR LUMBAR INTERBODY FUSION;  Surgeon: Eustace Moore, MD;  Location: [place] [facility];  Service: Neurosurgery;  Laterality: N/A;  L3-4 L4-5 MAXIMUM ACCESS POSTERIOR LUMBAR INTERBODY FUSION  . VAGINAL  HYSTERECTOMY  1970s   uterine prolapse    There were no vitals filed for this visit.  Subjective Assessment - 11/20/17 1111    Subjective  Out because of strep throat and antibiotics gave her GI upset. Feels like she is starting from day one again. Did begin to do some of her HEP starting 3 days ago.     Pertinent History  glaucoma, depression, anxiety, arthritis, back surgeries    Currently in Pain?  No/denies                      [facility] Adult PT Treatment/Exercise - 11/20/17 1638      Ambulation/Gait   Ambulation/Gait  Yes    Ambulation/Gait Assistance  6: Modified independent (Device/Increase time);5: Supervision    Ambulation/Gait Assistance Details  with cane modified independent; no device supervision    Ambulation Distance (Feet)  240 Feet 100, 120    Assistive device  Straight cane;None    Gait Pattern  Within Functional Limits    Ambulation Surface  Indoor    Gait velocity  4.11 ft/sec    Gait Comments  head turns horizontal (most difficult), vertical; tracking ball with eyes while walking;           Balance Exercises - 11/20/17 1652      Balance Exercises: Standing   Tandem Stance  Eyes open;Intermittent upper extremity support;3 reps bil    SLS with Vectors  Foam/compliant surface on red mat, no UE support; single/double cone taps    Rockerboard  Anterior/posterior;Head turns;EO;EC;Intermittent UE support step off/on anterior/posterior    Balance Beam  1/2 roller (flat side down) side step along with bil UE support; lots of hip strategies     Turning  Right;Left;10 reps 90, 180; instability with balance          PT Short Term Goals - 11/02/17 2018      PT SHORT TERM GOAL #1   Title  Patient will be independent with HEP to address strength and balance. (Target all STGs 11/04/17)    Time  4    Period  Weeks    Status  Achieved      PT SHORT TERM GOAL #2   Title  Patient will ambulate 500 ft indoors modifed independent with LRAD     Time  4     Period  Weeks    Status  Achieved      PT SHORT TERM GOAL #3   Title  Complete balance assessment (either SOT, FGA or Berg) and set goals as appropriate.     Baseline  12/5 SOT completed; 12/28 FGA 24/30    Time  1    Period  Weeks    Status  Achieved        PT Long Term Goals - 11/02/17 2019      PT LONG TERM GOAL #1   Title  Patient will be independent with updated HEP (Target all STGs 11/04/17)    Time  8    Period  Weeks    Status  On-going      PT LONG TERM GOAL #2   Title  Patient will ambulate 500 ft with LRAD on unlevel outdoor terr. ain    Time  8    Period  Weeks    Status  New      PT LONG TERM GOAL #3   Title  Patient's gait velocity will improve to 2.62 ft/sec to indicate safe ambulator in a commumity environment.     Baseline  11/02/17 3.80 ft/sec    Time  8    Period  Weeks    Status  Achieved      PT LONG TERM GOAL #4   Title  On repeat SOT will improve her composite score to >= norm for her age.    Baseline  12/5 50% compared to norm of 65%; 12/28 composite 46 compared to norm of 65%    Time  7    Period  Weeks    Status  Not Met            Plan - 11/20/17 2023    Clinical Impression Statement  Mini-assessment completed as pt missed ~2 weeks of PT due to illness. Session shortened due to pt's late arrival. Patient has improved her gait velocity since last measured. She did demonstrate numerous LOB throughout session, however was able to independently regain balance when over solid, tiled floor. Sense of dysequilibrium consitently provoked when turning to her left. Educated patient to incorporate left head turns and bodily turns into her HEP. Patient can continue to benefit from skilled PT, especially in light of numerous missed sessions due to pt's illness.     Rehab Potential  Good    PT Frequency  2x / week    PT Duration  8 weeks  PT Treatment/Interventions  ADLs/Self Care Home Management;DME Instruction;Gait training;Stair  training;Functional mobility training;Therapeutic activities;Therapeutic exercise;Balance training;Neuromuscular re-education;Patient/family education;Passive range of motion;Vestibular;Visual/perceptual remediation/compensation    PT Next Visit Plan  check for need to update or add to her HEP; activities with head turns; complitant surfaces    Consulted and Agree with Plan of Care  Patient       Patient will benefit from skilled therapeutic intervention in order to improve the following deficits and impairments:  Abnormal gait, Cardiopulmonary status limiting activity, Decreased activity tolerance, Decreased coordination, Decreased balance, Decreased knowledge of use of DME, Decreased mobility, Decreased strength, Difficulty walking, Dizziness, Impaired sensation, Impaired vision/preception  Visit Diagnosis: Unsteady gait  Muscle weakness (generalized)     Problem List Patient Active Problem List   Diagnosis Date Noted  . Community acquired pneumonia 10/26/2015  . Parapneumonic effusion   . Intractable back pain 04/09/2015  . Intractable pain 04/08/2015  . S/P lumbar spinal fusion 11/27/2014  . Right shoulder pain 09/02/2014  . Low back pain 09/02/2014  . Axillary pain 07/12/2014  . Wellness examination 05/22/2014  . Insomnia 05/14/2013  . Preventive measure 04/15/2012  . Depressive disorder 01/23/2012  . Urge incontinence 01/23/2012  . ARTHRITIS, HANDS, BILATERAL 03/25/2009  . HYPERCHOLESTEROLEMIA 02/27/2008  . MIGRAINE HEADACHE 02/27/2008  . Essential hypertension 02/27/2008  . Anxiety state 08/01/2007    Rexanne Mano, PT 11/20/2017, 8:40 PM  [facility] [address] [place], [place], [address] Phone: [phone]   Fax:  [phone]  Name: BRAELEY BUSKEY MRN: [id number] Date of Birth: [birth year]

## 2017-11-22 ENCOUNTER — Ambulatory Visit: Payer: Medicare Other | Admitting: Physical Therapy

## 2017-11-22 DIAGNOSIS — R2681 Unsteadiness on feet: Secondary | ICD-10-CM

## 2017-11-22 NOTE — Therapy (Signed)
[facility] [address] [place], [place], [address] Phone: [phone]   Fax:  [phone]  Physical Therapy Treatment  Patient Details  Name: Melissa Gonzales MRN: [id number] Date of Birth: 1941/10/06 Referring Provider: Crist Infante MD   Encounter Date: 11/22/2017  PT End of Session - 11/22/17 1835    Visit Number  9    Number of Visits  17 eval plus 16 visits    Date for PT Re-Evaluation  12/04/17    Authorization Type  UHC MCR    Authorization Time Period  10/05/17-12/04/16    PT Start Time  1104    PT Stop Time  1144    PT Time Calculation (min)  40 min    Activity Tolerance  Patient tolerated treatment well    Behavior During Therapy  [facility] for tasks assessed/performed       Past Medical History:  Diagnosis Date  . Anxiety   . Arthritis   . Depression   . Diverticulosis   . GERD (gastroesophageal reflux disease)   . Glaucoma    Bilateral eyes  . Hyperlipidemia   . Hypertension   . Pneumonia    hx of  . Urgency of urination     Past Surgical History:  Procedure Laterality Date  . BACK SURGERY    . CHOLECYSTECTOMY  1993  . COLONOSCOPY    . EYE SURGERY     Cataract Surgery Bilateral, 5 years ago  . MAXIMUM ACCESS (MAS)POSTERIOR LUMBAR INTERBODY FUSION (PLIF) 1 LEVEL N/A 03/17/2015   Procedure: Lumbar five -sacral one Maximum access posterior lumbar interbody fusion/Lumbar two-three  Laminectomy and extension of instrumentation to [place];  Surgeon: Eustace Moore, MD;  Location: [facility];  Service: Neurosurgery;  Laterality: N/A;  . MAXIMUM ACCESS (MAS)POSTERIOR LUMBAR INTERBODY FUSION (PLIF) 2 LEVEL N/A 11/27/2014   Procedure: LUMBAR THREE-FOUR, LUMBAR FOUR-FIVE MAXIMUM ACCESS POSTERIOR LUMBAR INTERBODY FUSION;  Surgeon: Eustace Moore, MD;  Location: [facility];  Service: Neurosurgery;  Laterality: N/A;  L3-4 L4-5 MAXIMUM ACCESS POSTERIOR LUMBAR INTERBODY FUSION  . VAGINAL HYSTERECTOMY  1970s    uterine prolapse    There were no vitals filed for this visit.  Subjective Assessment - 11/22/17 1108    Subjective  Has been building back up to do her full HEP. 1/2 turns most challenging.     Pertinent History  glaucoma, depression, anxiety, arthritis, back surgeries    Currently in Pain?  No/denies                         [facility]) - 11/22/17 1208    [facility]! Twist  pt feeling lightheaded due to not eating so worked in supine; x5 lt and rt, watching hand across and rolling body; pt denied dizziness or sense of imbalance    [facility]WR! Rock  on red mat; reaching per usual, then reaching across at shoulder level with wt-shifting    PWR Step  on red mat; alternating lt/rt lunging backward with turning out to face 3:00 and then 9:00     most LOB with PWR step with occasional assist to recover  Balance Exercises - 11/22/17 1154      Balance Exercises: Standing   Standing Eyes Opened  Narrow base of support (BOS);Head turns;Foam/compliant surface;Solid surface feet tog: foam, green air disc; tandem solid surface    Standing Eyes Closed  Narrow base of support (BOS);Foam/compliant surface green air disc; green foam  Stepping Strategy  Anterior;Posterior;Lateral;Foam/compliant surface;10 reps each; red mat    Gait with Head Turns  Forward 80 ft: lt/rt x 2; up/down x 2; each diagonal x2 cog challeng    Marching Limitations  red mat fwd and backward + cognitive challenges    Other Standing Exercises  walking around "track" while tossing scarf over head and catching with opposite hand while completing cognitive/naming challenge. Pt with periods of standing stationary due to cognitive challenge and forgot to continue walking, +losses of balance with independent recovery      Push and release test on red mat: multiple reps posterior, to right and to left with pt able to regain balance with single large step posteriorly, however required >2 steps to left and to right    PT Short  Term Goals - 11/02/17 2018      PT SHORT TERM GOAL #1   Title  Patient will be independent with HEP to address strength and balance. (Target all STGs 11/04/17)    Time  4    Period  Weeks    Status  Achieved      PT SHORT TERM GOAL #2   Title  Patient will ambulate 500 ft indoors modifed independent with LRAD     Time  4    Period  Weeks    Status  Achieved      PT SHORT TERM GOAL #3   Title  Complete balance assessment (either SOT, FGA or Berg) and set goals as appropriate.     Baseline  12/5 SOT completed; 12/28 FGA 24/30    Time  1    Period  Weeks    Status  Achieved        PT Long Term Goals - 11/02/17 2019      PT LONG TERM GOAL #1   Title  Patient will be independent with updated HEP (Target all STGs 11/04/17)    Time  8    Period  Weeks    Status  On-going      PT LONG TERM GOAL #2   Title  Patient will ambulate 500 ft with LRAD on unlevel outdoor terr. ain    Time  8    Period  Weeks    Status  New      PT LONG TERM GOAL #3   Title  Patient's gait velocity will improve to 2.62 ft/sec to indicate safe ambulator in a commumity environment.     Baseline  11/02/17 3.80 ft/sec    Time  8    Period  Weeks    Status  Achieved      PT LONG TERM GOAL #4   Title  On repeat SOT will improve her composite score to >= norm for her age.    Baseline  12/5 50% compared to norm of 65%; 12/28 composite 46 compared to norm of 65%    Time  7    Period  Weeks    Status  Not Met            Plan - 11/22/17 1840    Clinical Impression Statement  Patient continues to have impaired balance, especially with head turns and cognitive challenges while performing balance activities. 90% of the time she is able to catch her balance independently, however did need assist to recover ~10% of the time. She can continue to benefit from skilled PT.     Rehab Potential  Good    PT Frequency  2x / week  PT Duration  8 weeks    PT Treatment/Interventions  ADLs/Self Care Home  Management;DME Instruction;Gait training;Stair training;Functional mobility training;Therapeutic activities;Therapeutic exercise;Balance training;Neuromuscular re-education;Patient/family education;Passive range of motion;Vestibular;Visual/perceptual remediation/compensation    PT Next Visit Plan  ?check for posterior BPPV (no symptoms with supine rolling); activities with head turns (esp left turns); complitant surfaces (neuropathy); outdoors x 500 ft LRAD    Consulted and Agree with Plan of Care  Patient       Patient will benefit from skilled therapeutic intervention in order to improve the following deficits and impairments:  Abnormal gait, Cardiopulmonary status limiting activity, Decreased activity tolerance, Decreased coordination, Decreased balance, Decreased knowledge of use of DME, Decreased mobility, Decreased strength, Difficulty walking, Dizziness, Impaired sensation, Impaired vision/preception  Visit Diagnosis: Unsteady gait     Problem List Patient Active Problem List   Diagnosis Date Noted  . Community acquired pneumonia 10/26/2015  . Parapneumonic effusion   . Intractable back pain 04/09/2015  . Intractable pain 04/08/2015  . S/P lumbar spinal fusion 11/27/2014  . Right shoulder pain 09/02/2014  . Low back pain 09/02/2014  . Axillary pain 07/12/2014  . Wellness examination 05/22/2014  . Insomnia 05/14/2013  . Preventive measure 04/15/2012  . Depressive disorder 01/23/2012  . Urge incontinence 01/23/2012  . ARTHRITIS, HANDS, BILATERAL 03/25/2009  . HYPERCHOLESTEROLEMIA 02/27/2008  . MIGRAINE HEADACHE 02/27/2008  . Essential hypertension 02/27/2008  . Anxiety state 08/01/2007    Rexanne Mano, PT 11/22/2017, 6:51 PM  [facility] [address] [place], [place], [address] Phone: [phone]   Fax:  [phone]  Name: CABELLA KIMM MRN: [id number] Date of Birth: 1941-07-20

## 2017-11-26 ENCOUNTER — Ambulatory Visit: Payer: Medicare Other | Admitting: Physical Therapy

## 2017-11-28 ENCOUNTER — Ambulatory Visit: Payer: Medicare Other | Admitting: Physical Therapy

## 2017-11-28 ENCOUNTER — Encounter: Payer: Self-pay | Admitting: Physical Therapy

## 2017-11-28 DIAGNOSIS — R2681 Unsteadiness on feet: Secondary | ICD-10-CM | POA: Diagnosis not present

## 2017-11-28 DIAGNOSIS — M6281 Muscle weakness (generalized): Secondary | ICD-10-CM

## 2017-11-28 NOTE — Therapy (Signed)
[facility] [address] [place], [place], [address] Phone: [phone]   Fax:  [phone]  Physical Therapy Treatment  Patient Details  Name: Melissa Gonzales MRN: [id number] Date of Birth: 10-Apr-1941 Referring Provider: Crist Infante MD   Encounter Date: 11/28/2017  PT End of Session - 11/28/17 1429    Visit Number  10    Number of Visits  17 eval plus 16 visits    Date for PT Re-Evaluation  12/04/17    Authorization Type  [employer] MCR    Authorization Time Period  10/05/17-12/04/16    PT Start Time  1105    PT Stop Time  1150    PT Time Calculation (min)  45 min    Activity Tolerance  Patient tolerated treatment well    Behavior During Therapy  [facility] for tasks assessed/performed       Past Medical History:  Diagnosis Date  . Anxiety   . Arthritis   . Depression   . Diverticulosis   . GERD (gastroesophageal reflux disease)   . Glaucoma    Bilateral eyes  . Hyperlipidemia   . Hypertension   . Pneumonia    hx of  . Urgency of urination     Past Surgical History:  Procedure Laterality Date  . BACK SURGERY    . CHOLECYSTECTOMY  1993  . COLONOSCOPY    . EYE SURGERY     Cataract Surgery Bilateral, 5 years ago  . MAXIMUM ACCESS (MAS)POSTERIOR LUMBAR INTERBODY FUSION (PLIF) 1 LEVEL N/A 03/17/2015   Procedure: Lumbar five -sacral one Maximum access posterior lumbar interbody fusion/Lumbar two-three  Laminectomy and extension of instrumentation to [place];  Surgeon: Eustace Moore, MD;  Location: [facility];  Service: Neurosurgery;  Laterality: N/A;  . MAXIMUM ACCESS (MAS)POSTERIOR LUMBAR INTERBODY FUSION (PLIF) 2 LEVEL N/A 11/27/2014   Procedure: LUMBAR THREE-FOUR, LUMBAR FOUR-FIVE MAXIMUM ACCESS POSTERIOR LUMBAR INTERBODY FUSION;  Surgeon: Eustace Moore, MD;  Location: [facility];  Service: Neurosurgery;  Laterality: N/A;  L3-4 L4-5 MAXIMUM ACCESS POSTERIOR LUMBAR INTERBODY FUSION  . VAGINAL HYSTERECTOMY  1970s    uterine prolapse    There were no vitals filed for this visit.  Subjective Assessment - 11/28/17 1115    Subjective  Was on her feet (in tennis shoes) in her garage x 5 hours Saturday and right foot began to hurt again at 5th metatarsal head (left also mildly hurthing). Almost cancelled today's appointment as not sure how much she can do.     Pertinent History  glaucoma, depression, anxiety, arthritis, back surgeries    Currently in Pain?  Yes    Pain Score  6     Pain Location  Foot    Pain Orientation  Right;Left    Pain Descriptors / Indicators  Discomfort;Grimacing;Guarding    Pain Type  Acute pain    Pain Onset  In the past 7 days    Pain Frequency  Intermittent    Aggravating Factors   standing and walking    Pain Relieving Factors  sitting                      [facility] Adult PT Treatment/Exercise - 11/28/17 1406      Exercises   Exercises  Other Exercises    Other Exercises   Had pt remove her shoes and stand for better view of her resting foot posture (moderately pronated with mild left calcaneal valgus). Gently stretched foot into  supination and pt reported she felt mild stretch at the site of the pain. Instructed to hld for 30 sec, repeat 3x each foot x 30 sec twice per day. Return to using ice for pain relief and potentially anti-inflammatory meds if appropriate after checking with her PCP or pharmacist.       Vestibular Treatment/Exercise - 11/28/17 0001      Vestibular Treatment/Exercise   Gaze Exercises  X1 Viewing Horizontal;X1 Viewing Vertical;Comment      X1 Viewing Horizontal   Foot Position  feet together, partial stagger lt/rt on cushion    Time  -- 30 sec    Reps  2    Comments  + imbalance with ability to self-correct       X1 Viewing Vertical   Foot Position  feet together, partial stagger lt/rt on cushion    Time  -- 30 sec    Reps  2    Comments  + imbalance that required supporting corner walls to correct herself         Balance  Exercises - 11/28/17 1420      Balance Exercises: Standing   Stepping Strategy  Posterior;10 reps at counter. some with hip hinge,some with "lunge" position        PT Education - 11/28/17 1425    Education provided  Yes    Education Details  stretches for bil evertors; need for shoe with support for pronators; 2 stores in town that do well fitting people with the best shoe for their feet; pt inquiring re: purchasing Theraband stabilizer or Sissel "air disk" to use with her HEP--provided recommendation of each type.     Person(s) Educated  Patient    Methods  Explanation;Handout    Comprehension  Verbalized understanding       PT Short Term Goals - 11/02/17 2018      PT SHORT TERM GOAL #1   Title  Patient will be independent with HEP to address strength and balance. (Target all STGs 11/04/17)    Time  4    Period  Weeks    Status  Achieved      PT SHORT TERM GOAL #2   Title  Patient will ambulate 500 ft indoors modifed independent with LRAD     Time  4    Period  Weeks    Status  Achieved      PT SHORT TERM GOAL #3   Title  Complete balance assessment (either SOT, FGA or Berg) and set goals as appropriate.     Baseline  12/5 SOT completed; 12/28 FGA 24/30    Time  1    Period  Weeks    Status  Achieved        PT Long Term Goals - 11/02/17 2019      PT LONG TERM GOAL #1   Title  Patient will be independent with updated HEP (Target all STGs 11/04/17)    Time  8    Period  Weeks    Status  On-going      PT LONG TERM GOAL #2   Title  Patient will ambulate 500 ft with LRAD on unlevel outdoor terr. ain    Time  8    Period  Weeks    Status  New      PT LONG TERM GOAL #3   Title  Patient's gait velocity will improve to 2.62 ft/sec to indicate safe ambulator in a commumity environment.     Baseline  11/02/17 3.80 ft/sec    Time  8    Period  Weeks    Status  Achieved      PT LONG TERM GOAL #4   Title  On repeat SOT will improve her composite score to >= norm for  her age.    Baseline  12/5 50% compared to norm of 65%; 12/28 composite 46 compared to norm of 65%    Time  7    Period  Weeks    Status  Not Met            Plan - 11/28/17 1434    Clinical Impression Statement  Patient somewhat limited in therapy due to onset of bil foot pain (pt with antalgic gait as entering clinic). Addressed patient's questions re: home equipment to continue to challenge herself at home, type of footwear that can potentially help her foot pain and her balance, and upgrading her VORx1 exercise for HEP. Plan to wrap up final questions, reassess FGA and discharge next visit.     Rehab Potential  Good    PT Frequency  2x / week    PT Duration  8 weeks    PT Treatment/Interventions  ADLs/Self Care Home Management;DME Instruction;Gait training;Stair training;Functional mobility training;Therapeutic activities;Therapeutic exercise;Balance training;Neuromuscular re-education;Patient/family education;Passive range of motion;Vestibular;Visual/perceptual remediation/compensation    PT Next Visit Plan  repeat FGA; outdoors x 500 ft LRAD; check LTGs    Consulted and Agree with Plan of Care  Patient       Patient will benefit from skilled therapeutic intervention in order to improve the following deficits and impairments:  Abnormal gait, Cardiopulmonary status limiting activity, Decreased activity tolerance, Decreased coordination, Decreased balance, Decreased knowledge of use of DME, Decreased mobility, Decreased strength, Difficulty walking, Dizziness, Impaired sensation, Impaired vision/preception  Visit Diagnosis: Unsteady gait  Muscle weakness (generalized)     Problem List Patient Active Problem List   Diagnosis Date Noted  . Community acquired pneumonia 10/26/2015  . Parapneumonic effusion   . Intractable back pain 04/09/2015  . Intractable pain 04/08/2015  . S/P lumbar spinal fusion 11/27/2014  . Right shoulder pain 09/02/2014  . Low back pain 09/02/2014  .  Axillary pain 07/12/2014  . Wellness examination 05/22/2014  . Insomnia 05/14/2013  . Preventive measure 04/15/2012  . Depressive disorder 01/23/2012  . Urge incontinence 01/23/2012  . ARTHRITIS, HANDS, BILATERAL 03/25/2009  . HYPERCHOLESTEROLEMIA 02/27/2008  . MIGRAINE HEADACHE 02/27/2008  . Essential hypertension 02/27/2008  . Anxiety state 08/01/2007   Physical Therapy Progress Note  Dates of Reporting Period: 10/05/17 to 11/28/17  Objective Reports of Subjective Statement: Some days I feel great and exercises are easier; other days I feel more unsteady during the exercises.   Objective Measurements: gait velocity 4.11 ft/sec (on 11/20/17)  Goal Update: NA, plan for discharge  Plan: Discharge next visit  Reason Skilled Services are Required: Complete d/c assessment    Rexanne Mano, PT 11/28/2017, 2:42 PM  [facility] [address] [place], [place], [address] Phone: [phone]   Fax:  [phone]  Name: Melissa Gonzales MRN: [id number] Date of Birth: [birth year]

## 2017-12-03 ENCOUNTER — Ambulatory Visit: Payer: Medicare Other | Admitting: Family Medicine

## 2017-12-04 ENCOUNTER — Encounter: Payer: Self-pay | Admitting: Family Medicine

## 2017-12-04 ENCOUNTER — Ambulatory Visit: Payer: Medicare Other | Admitting: Family Medicine

## 2017-12-04 ENCOUNTER — Ambulatory Visit (HOSPITAL_BASED_OUTPATIENT_CLINIC_OR_DEPARTMENT_OTHER)
Admission: RE | Admit: 2017-12-04 | Discharge: 2017-12-04 | Disposition: A | Discharge status: Home / Self Care | Payer: Medicare Other | Source: Ambulatory Visit | Attending: Family Medicine | Admitting: Family Medicine

## 2017-12-04 ENCOUNTER — Ambulatory Visit: Payer: Medicare Other | Admitting: Physical Therapy

## 2017-12-04 ENCOUNTER — Encounter: Payer: Self-pay | Admitting: Physical Therapy

## 2017-12-04 VITALS — BP 156/90 | HR 76 | Ht 65.0 in | Wt 134.0 lb

## 2017-12-04 DIAGNOSIS — M79671 Pain in right foot: Secondary | ICD-10-CM

## 2017-12-04 DIAGNOSIS — S92351A Displaced fracture of fifth metatarsal bone, right foot, initial encounter for closed fracture: Secondary | ICD-10-CM | POA: Insufficient documentation

## 2017-12-04 DIAGNOSIS — X58XXXA Exposure to other specified factors, initial encounter: Secondary | ICD-10-CM | POA: Insufficient documentation

## 2017-12-04 MED ORDER — DICLOFENAC SODIUM 1 % TD GEL: 2.0000 g | Freq: Four times a day (QID) | TRANSDERMAL | 3 refills | Status: DC

## 2017-12-04 NOTE — Patient Instructions (Signed)
You have an avulsion fracture of your fifth metatarsal. I suspect this is about 583 weeks old and they take 6-8 weeks to heal. Wear the boot when up and walking around. Ok to take off to sleep, wash, ice the area. Icing 15 minutes at a time 3-4 times a day. Tylenol 500mg  1-2 tabs three times a day as needed for pain. Ibuprofen 600mg  three times a day with food for pain and inflammation. Follow up with me in 3 weeks for reevaluation.

## 2017-12-04 NOTE — Assessment & Plan Note (Signed)
Right 5th metatarsal avulsion fracture - independently reviewed radiographs, performed and reviewed ultrasound noting avulsion fracture.  Suspect this is 13 weeks old, should heal over 3-5 more weeks.  Cam walker.  Icing, tylenol, ibuprofen if needed.  F/u in 3 weeks.

## 2017-12-04 NOTE — Therapy (Signed)
[facility] [address] [place], [place], [address] Phone: [phone]   Fax:  [phone]  Patient Details  Name: Melissa Gonzales MRN: [id number] Date of Birth: 09/18/1941 Referring Provider:  No ref. provider found  Encounter Date: 12/04/2017  Patient called to tell me she saw MD re: foot pain and has an avulsion fracture of rt fifth metatarsal head. She reports her left foot has been feeling better and he did not assess her left foot.   She is now in a cam walker for 3 weeks and then will return to see MD. She is concerned about her balance while walking in the "boot" and advised her to return to using her RW (she confirmed she still has) and to try to find a sneaker for her left foot that is approximately as tall as the boot to keep her as "level" as possible. She is upset that she will "lose ground" on her efforts to regain her stamina and encouraged her to contact MD to see if she would be allowed to swim or perhaps do recumbent cycling (?using cast shoe for cycling). She plans to follow-up with him via "My Chart"  Instructed patient that I will not complete her discharge as planned. Will keep her on hold for an additional 30 days and if she wants to schedule a follow-up appointment once she is out of her boot she can call and make an appointment. If it is longer than 30 days, she understands she will need to get a new referral from the physician.   [employer]ynn P Fairy Ashlock. PT 12/04/2017, 4:34 PM  [facility] [address] [place], [place], [address] Phone: [phone]   Fax:  [phone]

## 2017-12-04 NOTE — Progress Notes (Signed)
PCP: Rodrigo Ran, MD  Subjective:   HPI: Patient is a 77 y.o. female here for right foot pain.  Patient reports over 3 weeks ago she fell at the store and injured her left knee - this seemed to improve completely but also noted pain right foot laterally that started shortly after this. Does not recall injuring foot though is unsure if turned this when she fell. No history of stress fracture. Has osteopenia. Pain is worse with walking. Taking aleve. Pain level 1/10 but up to 6/10 and sharp at worst. Mild swelling.  No bruising, numbness.  Past Medical History:  Diagnosis Date  . Anxiety   . Arthritis   . Depression   . Diverticulosis   . GERD (gastroesophageal reflux disease)   . Glaucoma    Bilateral eyes  . Hyperlipidemia   . Hypertension   . Pneumonia    hx of  . Urgency of urination     Current Outpatient Medications on File Prior to Visit  Medication Sig Dispense Refill  . ALPHAGAN P 0.1 % SOLN Place 1 drop into both eyes every 12 (twelve) hours.     . ALPRAZolam (XANAX) 0.25 MG tablet TAKE 1 TABLET BY MOUTH TWICE DAILY AS NEEDED FOR ANXIETY 30 tablet 0  . atorvastatin (LIPITOR) 40 MG tablet take 1 tablet by mouth once daily (Patient taking differently: Take 40 mg by mouth daily at 6 PM. ) 90 tablet 3  . azithromycin (ZITHROMAX) 500 MG tablet Take 1 tablet (500 mg total) by mouth daily. (Patient not taking: Reported on 12/21/2015) 3 tablet 0  . benzonatate (TESSALON) 200 MG capsule Take 1 capsule (200 mg total) by mouth 3 (three) times daily as needed for cough. (Patient not taking: Reported on 12/21/2015) 20 capsule 0  . Calcium Carb-Cholecalciferol (CALCIUM 600 + D PO) Take 1 tablet by mouth 2 (two) times daily.    . cefpodoxime (VANTIN) 200 MG tablet Take 1 tablet (200 mg total) by mouth every 12 (twelve) hours. (Patient not taking: Reported on 12/21/2015) 12 tablet 0  . dorzolamide-timolol (COSOPT) 22.3-6.8 MG/ML ophthalmic solution Place 1 drop into both eyes 2 (two)  times daily.     Marland Kitchen esomeprazole (NEXIUM) 40 MG capsule Take 1 capsule (40 mg total) by mouth daily before breakfast. 90 capsule 3  . glucosamine-chondroitin 500-400 MG tablet Take 1 tablet by mouth 2 (two) times daily.    . hydrochlorothiazide (MICROZIDE) 12.5 MG capsule Take 1 capsule (12.5 mg total) by mouth daily. (Patient not taking: Reported on 12/21/2015) 90 capsule 3  . latanoprost (XALATAN) 0.005 % ophthalmic solution Place 1 drop into both eyes at bedtime.     . lidocaine (LIDODERM) 5 % Place 1 patch onto the skin daily. Remove & Discard patch within 12 hours or as directed by MD (Patient not taking: Reported on 10/05/2017) 30 patch 0  . lisinopril (PRINIVIL,ZESTRIL) 20 MG tablet take 1 tablet by mouth once daily 90 tablet 3  . loratadine (CLARITIN) 10 MG tablet Take 10 mg by mouth daily as needed for allergies.     . Multiple Vitamins-Minerals (MULTIVITAMIN WITH MINERALS) tablet Take 1 tablet by mouth daily.    . Omega-3 Fatty Acids (FISH OIL) 1000 MG CAPS Take 1 capsule by mouth 2 (two) times daily.    Marland Kitchen oxybutynin (DITROPAN-XL) 10 MG 24 hr tablet Take 1 tablet (10 mg total) by mouth daily. (Patient not taking: Reported on 10/25/2015) 30 tablet 6  . oxyCODONE (OXY IR/ROXICODONE) 5 MG immediate release  tablet Take 1 tablet (5 mg total) by mouth every 6 (six) hours as needed for severe pain. (Patient not taking: Reported on 11/23/2015) 30 tablet 0  . sertraline (ZOLOFT) 100 MG tablet Take 1 tablet (100 mg total) by mouth daily. (Patient not taking: Reported on 10/05/2017) 90 tablet 3  . solifenacin (VESICARE) 10 MG tablet Take 10 mg by mouth daily. Reported on 12/21/2015    . tiZANidine (ZANAFLEX) 4 MG tablet Take 1 tablet (4 mg total) by mouth every 8 (eight) hours as needed for muscle spasms. (Patient not taking: Reported on 11/23/2015) 60 tablet 1  . vitamin B-12 (CYANOCOBALAMIN) 100 MCG tablet Take 100 mcg by mouth daily.     No current facility-administered medications on file prior to  visit.     Past Surgical History:  Procedure Laterality Date  . BACK SURGERY    . CHOLECYSTECTOMY  1993  . COLONOSCOPY    . EYE SURGERY     Cataract Surgery Bilateral, 5 years ago  . MAXIMUM ACCESS (MAS)POSTERIOR LUMBAR INTERBODY FUSION (PLIF) 1 LEVEL N/A 03/17/2015   Procedure: Lumbar five -sacral one Maximum access posterior lumbar interbody fusion/Lumbar two-three  Laminectomy and extension of instrumentation to Sacral-one;  Surgeon: Tia Alertavid S Jones, MD;  Location: [facility];  Service: Neurosurgery;  Laterality: N/A;  . MAXIMUM ACCESS (MAS)POSTERIOR LUMBAR INTERBODY FUSION (PLIF) 2 LEVEL N/A 11/27/2014   Procedure: LUMBAR THREE-FOUR, LUMBAR FOUR-FIVE MAXIMUM ACCESS POSTERIOR LUMBAR INTERBODY FUSION;  Surgeon: Tia Alertavid S Jones, MD;  Location: [facility];  Service: Neurosurgery;  Laterality: N/A;  L3-4 L4-5 MAXIMUM ACCESS POSTERIOR LUMBAR INTERBODY FUSION  . VAGINAL HYSTERECTOMY  1970s   uterine prolapse    No Known Allergies  Social History   Socioeconomic History  . Marital status: Widowed    Spouse name: Not on file  . Number of children: 3  . Years of education: Not on file  . Highest education level: Not on file  Social Needs  . Financial resource strain: Not on file  . Food insecurity - worry: Not on file  . Food insecurity - inability: Not on file  . Transportation needs - medical: Not on file  . Transportation needs - non-medical: Not on file  Occupational History    Employer: RETIRED  Tobacco Use  . Smoking status: Never Smoker  . Smokeless tobacco: Never Used  Substance and Sexual Activity  . Alcohol use: Yes    Alcohol/week: 0.6 oz    Types: 1 Glasses of wine per week    Comment: 1 glass per night  . Drug use: No  . Sexual activity: No  Other Topics Concern  . Not on file  Social History Narrative   Husband died suicide age 4140s.  Currently in long term romantic relationship.  Support from 2 daughters and good relationship with grandchildren.  Other daughter  has mental health issues, strained relationship.  Pt feels she is not making good decisions re: children and has tried to get CPS involved without relief.   1/3/13Lucila Maine: Grandson (of daughter with mental health issues) facing jail time for selling drugs.  Pt has tried to support him and offered help with rehab or with changing colleges, but he refuses.      Family History  Problem Relation Age of Onset  . Mental illness Daughter   . Drug abuse Grandchild   . Heart disease Mother [id number]       died MI age 78  . Hyperlipidemia Sister   . Hypertension Sister   .  Cancer Father        Primary Cell Liver Cancer  . Diabetes Neg Hx     BP (!) 156/90   Pulse 76   Ht 5\' 5"  (1.651 m)   Wt 134 lb (60.8 kg)   BMI 22.30 kg/m   Review of Systems: See HPI above.     Objective:  Physical Exam:  Gen: NAD, comfortable in exam room  Right foot/ankle: Mild lateral swelling.  No bruising, other deformity. FROM with pain on ER and IR.  5/5 strength. TTP base 5th metatarsal.  No other tenderness. Negative ant drawer and talar tilt.   Negative syndesmotic compression. Thompsons test negative. NV intact distally.  Left foot/ankle: No deformity. FROM with 5/5 strength. No tenderness to palpation. NVI distally.   MSK u/s right foot:  Cortical irregularity at base of 5th metatarsal consistent with fracture.  Soft tissue swelling.  Assessment & Plan:  1. Right 5th metatarsal avulsion fracture - independently reviewed radiographs, performed and reviewed ultrasound noting avulsion fracture.  Suspect this is 33 weeks old, should heal over 3-5 more weeks.  Cam walker.  Icing, tylenol, ibuprofen if needed.  F/u in 3 weeks.

## 2017-12-06 ENCOUNTER — Telehealth: Payer: Self-pay | Admitting: Family Medicine

## 2017-12-06 NOTE — Telephone Encounter (Signed)
Unfortunately we're dealing with two competing issues here.  I would recommend she use a hard soled shoe - either a boot as she was placed in or a postop shoe - for the fifth metatarsal fracture.  She could just use supportive shoes like her New Balance but the concern is increased pain at the fracture site and increased possibility this doesn't heal properly (hard soled shoes prevent the bending of the midfoot-forefoot near the fracture).  The choice is up to her.  We could let her try on a postop shoe instead also.  I don't think I would do pilates right now until she's 6 weeks out from the fracture (in about 3 weeks).  But she should be able to do some of her balance exercises in a hard soled shoe.

## 2017-12-06 NOTE — Telephone Encounter (Signed)
Ok that sounds good, thanks!

## 2017-12-06 NOTE — Telephone Encounter (Signed)
Patient's piliates teacher contacted office with patient on the phone regarding concerns with her boot. States patient currently has balance issues and is in physical therapy for balance retraining. Piliates teacher, patient and patient's Neuro physical therapist are concerned that her being in a boot is increasing the risk of her taking a fall.   Patient is currently not wearing the boot, only new balance sneakers and feels good at the moment. Wants to get provider's approval on not wearing the boot.

## 2017-12-06 NOTE — Telephone Encounter (Signed)
Patient was informed. She would like to go ahead and try the postop shoe. She will come by the office tomorrow to get it. Also regarding pilates, patient said she is only doing a few movements currently to help strengthen her spine, nothing that would cause injury to her feet.

## 2017-12-13 ENCOUNTER — Ambulatory Visit: Payer: Self-pay

## 2017-12-13 ENCOUNTER — Encounter: Payer: Self-pay | Admitting: Family Medicine

## 2017-12-13 ENCOUNTER — Ambulatory Visit: Payer: Medicare Other | Admitting: Family Medicine

## 2017-12-13 VITALS — BP 111/83 | HR 97 | Ht 65.0 in | Wt 135.0 lb

## 2017-12-13 DIAGNOSIS — M25512 Pain in left shoulder: Secondary | ICD-10-CM

## 2017-12-13 DIAGNOSIS — M25511 Pain in right shoulder: Secondary | ICD-10-CM | POA: Diagnosis not present

## 2017-12-13 DIAGNOSIS — G8929 Other chronic pain: Secondary | ICD-10-CM | POA: Diagnosis not present

## 2017-12-13 NOTE — Patient Instructions (Addendum)
You have partial tears of your supraspinatus (rotator cuff muscle) and proximal biceps tendon. Ice the area 15 minutes at a time 3-4 times a day. Voltaren gel up to 4 times a day for pain and inflammation. Ok to take tylenol with this. Do simple motion exercises first without the theraband for a few days then start using the yellow theraband to strengthen these muscles. Follow up with me as scheduled for your foot in a couple weeks and we will review your shoulder.

## 2017-12-16 ENCOUNTER — Encounter: Payer: Self-pay | Admitting: Family Medicine

## 2017-12-16 NOTE — Progress Notes (Signed)
PCP: Rodrigo RanPerini, Mark, MD  Subjective:   HPI: Patient is a 77 y.o. female here for bilateral shoulder pain.  Patient reports for about 1 week now she's had L > R shoulder pain. Pain 7/10 and sharp on left, 3/10 on the right. She has a history of bursitis though current pain feels different. Tried ice/heat, voltaren gel. Motion limited on left. No acute injury though about 2 weeks ago she noticed bruising anterior left shoulder that has come down into upper arm. No numbness.  Past Medical History:  Diagnosis Date  . Anxiety   . Arthritis   . Depression   . Diverticulosis   . GERD (gastroesophageal reflux disease)   . Glaucoma    Bilateral eyes  . Hyperlipidemia   . Hypertension   . Pneumonia    hx of  . Urgency of urination     Current Outpatient Medications on File Prior to Visit  Medication Sig Dispense Refill  . ALPHAGAN P 0.1 % SOLN Place 1 drop into both eyes every 12 (twelve) hours.     . ALPRAZolam (XANAX) 0.25 MG tablet TAKE 1 TABLET BY MOUTH TWICE DAILY AS NEEDED FOR ANXIETY 30 tablet 0  . atorvastatin (LIPITOR) 40 MG tablet take 1 tablet by mouth once daily (Patient taking differently: Take 40 mg by mouth daily at 6 PM. ) 90 tablet 3  . azithromycin (ZITHROMAX) 500 MG tablet Take 1 tablet (500 mg total) by mouth daily. (Patient not taking: Reported on 12/21/2015) 3 tablet 0  . benzonatate (TESSALON) 200 MG capsule Take 1 capsule (200 mg total) by mouth 3 (three) times daily as needed for cough. (Patient not taking: Reported on 12/21/2015) 20 capsule 0  . Calcium Carb-Cholecalciferol (CALCIUM 600 + D PO) Take 1 tablet by mouth 2 (two) times daily.    . cefpodoxime (VANTIN) 200 MG tablet Take 1 tablet (200 mg total) by mouth every 12 (twelve) hours. (Patient not taking: Reported on 12/21/2015) 12 tablet 0  . diclofenac sodium (VOLTAREN) 1 % GEL Apply 2 g topically 4 (four) times daily. 3 Tube 3  . dorzolamide-timolol (COSOPT) 22.3-6.8 MG/ML ophthalmic solution Place 1 drop  into both eyes 2 (two) times daily.     Marland Kitchen. esomeprazole (NEXIUM) 40 MG capsule Take 1 capsule (40 mg total) by mouth daily before breakfast. 90 capsule 3  . glucosamine-chondroitin 500-400 MG tablet Take 1 tablet by mouth 2 (two) times daily.    . hydrochlorothiazide (MICROZIDE) 12.5 MG capsule Take 1 capsule (12.5 mg total) by mouth daily. (Patient not taking: Reported on 12/21/2015) 90 capsule 3  . latanoprost (XALATAN) 0.005 % ophthalmic solution Place 1 drop into both eyes at bedtime.     . lidocaine (LIDODERM) 5 % Place 1 patch onto the skin daily. Remove & Discard patch within 12 hours or as directed by MD (Patient not taking: Reported on 10/05/2017) 30 patch 0  . lisinopril (PRINIVIL,ZESTRIL) 20 MG tablet take 1 tablet by mouth once daily 90 tablet 3  . loratadine (CLARITIN) 10 MG tablet Take 10 mg by mouth daily as needed for allergies.     . Multiple Vitamins-Minerals (MULTIVITAMIN WITH MINERALS) tablet Take 1 tablet by mouth daily.    . Omega-3 Fatty Acids (FISH OIL) 1000 MG CAPS Take 1 capsule by mouth 2 (two) times daily.    Marland Kitchen. oxybutynin (DITROPAN-XL) 10 MG 24 hr tablet Take 1 tablet (10 mg total) by mouth daily. (Patient not taking: Reported on 10/25/2015) 30 tablet 6  . oxyCODONE (  OXY IR/ROXICODONE) 5 MG immediate release tablet Take 1 tablet (5 mg total) by mouth every 6 (six) hours as needed for severe pain. (Patient not taking: Reported on 11/23/2015) 30 tablet 0  . sertraline (ZOLOFT) 100 MG tablet Take 1 tablet (100 mg total) by mouth daily. (Patient not taking: Reported on 10/05/2017) 90 tablet 3  . solifenacin (VESICARE) 10 MG tablet Take 10 mg by mouth daily. Reported on 12/21/2015    . tiZANidine (ZANAFLEX) 4 MG tablet Take 1 tablet (4 mg total) by mouth every 8 (eight) hours as needed for muscle spasms. (Patient not taking: Reported on 11/23/2015) 60 tablet 1  . vitamin B-12 (CYANOCOBALAMIN) 100 MCG tablet Take 100 mcg by mouth daily.     No current facility-administered  medications on file prior to visit.     Past Surgical History:  Procedure Laterality Date  . BACK SURGERY    . CHOLECYSTECTOMY  1993  . COLONOSCOPY    . EYE SURGERY     Cataract Surgery Bilateral, 5 years ago  . MAXIMUM ACCESS (MAS)POSTERIOR LUMBAR INTERBODY FUSION (PLIF) 1 LEVEL N/A 03/17/2015   Procedure: Lumbar five -sacral one Maximum access posterior lumbar interbody fusion/Lumbar two-three  Laminectomy and extension of instrumentation to Sacral-one;  Surgeon: Tia Alert, MD;  Location: [facility];  Service: Neurosurgery;  Laterality: N/A;  . MAXIMUM ACCESS (MAS)POSTERIOR LUMBAR INTERBODY FUSION (PLIF) 2 LEVEL N/A 11/27/2014   Procedure: LUMBAR THREE-FOUR, LUMBAR FOUR-FIVE MAXIMUM ACCESS POSTERIOR LUMBAR INTERBODY FUSION;  Surgeon: Tia Alert, MD;  Location: [facility];  Service: Neurosurgery;  Laterality: N/A;  L3-4 L4-5 MAXIMUM ACCESS POSTERIOR LUMBAR INTERBODY FUSION  . VAGINAL HYSTERECTOMY  1970s   uterine prolapse    No Known Allergies  Social History   Socioeconomic History  . Marital status: Widowed    Spouse name: Not on file  . Number of children: 3  . Years of education: Not on file  . Highest education level: Not on file  Social Needs  . Financial resource strain: Not on file  . Food insecurity - worry: Not on file  . Food insecurity - inability: Not on file  . Transportation needs - medical: Not on file  . Transportation needs - non-medical: Not on file  Occupational History    Employer: RETIRED  Tobacco Use  . Smoking status: Never Smoker  . Smokeless tobacco: Never Used  Substance and Sexual Activity  . Alcohol use: Yes    Alcohol/week: 0.6 oz    Types: 1 Glasses of wine per week    Comment: 1 glass per night  . Drug use: No  . Sexual activity: No  Other Topics Concern  . Not on file  Social History Narrative   Husband died suicide age 47s.  Currently in long term romantic relationship.  Support from 2 daughters and good relationship with  grandchildren.  Other daughter has mental health issues, strained relationship.  Pt feels she is not making good decisions re: children and has tried to get CPS involved without relief.   1/3/13Lucila Maine (of daughter with mental health issues) facing jail time for selling drugs.  Pt has tried to support him and offered help with rehab or with changing colleges, but he refuses.      Family History  Problem Relation Age of Onset  . Mental illness Daughter   . Drug abuse Grandchild   . Heart disease Mother 29       died MI age 73  . Hyperlipidemia  Sister   . Hypertension Sister   . Cancer Father        Primary Cell Liver Cancer  . Diabetes Neg Hx     BP 111/83   Pulse 97   Ht 5\' 5"  (1.651 m)   Wt 135 lb (61.2 kg)   BMI 22.47 kg/m   Review of Systems: See HPI above.     Objective:  Physical Exam:  Gen: NAD, comfortable in exam room  Left shoulder: Bruising pronounced anterior left upper arm.  No popeye deformity.  Mild swelling. Mild TTP anterior shoulder and upper arm.  No other tenderness. FROM with pain at 90 degrees abduction and flexion. Positive Hawkins, Neers. Negative Yergasons. Strength 3/5 with empty can and 5/5 resisted internal/external rotation.  Pain empty can and ER. Negative apprehension. NV intact distally.  Right shoulder: No swelling, ecchymoses.  No gross deformity. No TTP. FROM. Negative Hawkins, Neers. Negative Yergasons. Strength 5-/5 with empty can and 5/5 resisted internal/external rotation. Negative apprehension. NV intact distally.  MSK u/s Left shoulder:  Long head biceps tendon appears to have intact fibers though partial tear noted with some edema in tendon sheath.  Subscapularis appears intact as does infraspinatus.  AC joint without mild increased effusion.  Partial thickness tear of supraspinatus - question in one image if extends bursal to insertional side but moves without retraction.  Right supraspinatus with full thickness tear  with mild retraction.  Assessment & Plan:  1. Bilateral shoulder pain - unusual that patient has not had an acute injury but significant bruising left arm.  Ultrasound performed and reviewed - consistent with partial proximal biceps tendon tear and supraspinatus tear.  Interestingly despite good strength and motion, right shoulder has a full thickness tear that is likely old (reports problems with shoulders in the past).  Icing, voltaren gel with tylenol as needed. Start motion exercises and advance as tolerated after a few days to very light theraband exercises.  F/u in 2 weeks.

## 2017-12-16 NOTE — Assessment & Plan Note (Signed)
unusual that patient has not had an acute injury but significant bruising left arm.  Ultrasound performed and reviewed - consistent with partial proximal biceps tendon tear and supraspinatus tear.  Interestingly despite good strength and motion, right shoulder has a full thickness tear that is likely old (reports problems with shoulders in the past).  Icing, voltaren gel with tylenol as needed. Start motion exercises and advance as tolerated after a few days to very light theraband exercises.  F/u in 2 weeks.

## 2017-12-17 ENCOUNTER — Encounter: Payer: Self-pay | Admitting: Internal Medicine

## 2017-12-25 ENCOUNTER — Encounter: Payer: Self-pay | Admitting: Family Medicine

## 2017-12-25 ENCOUNTER — Ambulatory Visit: Payer: Medicare Other | Admitting: Family Medicine

## 2017-12-25 DIAGNOSIS — M79671 Pain in right foot: Secondary | ICD-10-CM

## 2017-12-25 DIAGNOSIS — M25512 Pain in left shoulder: Secondary | ICD-10-CM | POA: Diagnosis not present

## 2017-12-25 DIAGNOSIS — G8929 Other chronic pain: Secondary | ICD-10-CM | POA: Diagnosis not present

## 2017-12-25 DIAGNOSIS — M25511 Pain in right shoulder: Secondary | ICD-10-CM

## 2017-12-25 NOTE — Progress Notes (Signed)
PCP: Rodrigo Ran, MD  Subjective:   HPI: Patient is a 77 y.o. female here for bilateral shoulder pain, right foot pain.  2/7: Patient reports for about 1 week now she's had L > R shoulder pain. Pain 7/10 and sharp on left, 3/10 on the right. She has a history of bursitis though current pain feels different. Tried ice/heat, voltaren gel. Motion limited on left. No acute injury though about 2 weeks ago she noticed bruising anterior left shoulder that has come down into upper arm. No numbness.  2/19: Patient reports she's doing well. Her shoulders are much better. Taking aleve occasionally with voltaren gel topically. Occasionally left shoulder will wake her up. Pain level 0/10 currently bilaterally. Her right foot is a little sore laterally. Pain level 3/10 noted with walking. No skin changes, numbness.  Past Medical History:  Diagnosis Date  . Anxiety   . Arthritis   . Depression   . Diverticulosis   . GERD (gastroesophageal reflux disease)   . Glaucoma    Bilateral eyes  . Hyperlipidemia   . Hypertension   . Pneumonia    hx of  . Urgency of urination     Current Outpatient Medications on File Prior to Visit  Medication Sig Dispense Refill  . ALPHAGAN P 0.1 % SOLN Place 1 drop into both eyes every 12 (twelve) hours.     . ALPRAZolam (XANAX) 0.25 MG tablet TAKE 1 TABLET BY MOUTH TWICE DAILY AS NEEDED FOR ANXIETY 30 tablet 0  . atorvastatin (LIPITOR) 40 MG tablet take 1 tablet by mouth once daily (Patient taking differently: Take 40 mg by mouth daily at 6 PM. ) 90 tablet 3  . azithromycin (ZITHROMAX) 500 MG tablet Take 1 tablet (500 mg total) by mouth daily. (Patient not taking: Reported on 12/21/2015) 3 tablet 0  . benzonatate (TESSALON) 200 MG capsule Take 1 capsule (200 mg total) by mouth 3 (three) times daily as needed for cough. (Patient not taking: Reported on 12/21/2015) 20 capsule 0  . Calcium Carb-Cholecalciferol (CALCIUM 600 + D PO) Take 1 tablet by mouth 2  (two) times daily.    . cefpodoxime (VANTIN) 200 MG tablet Take 1 tablet (200 mg total) by mouth every 12 (twelve) hours. (Patient not taking: Reported on 12/21/2015) 12 tablet 0  . diclofenac sodium (VOLTAREN) 1 % GEL Apply 2 g topically 4 (four) times daily. 3 Tube 3  . dorzolamide-timolol (COSOPT) 22.3-6.8 MG/ML ophthalmic solution Place 1 drop into both eyes 2 (two) times daily.     Marland Kitchen esomeprazole (NEXIUM) 40 MG capsule Take 1 capsule (40 mg total) by mouth daily before breakfast. 90 capsule 3  . glucosamine-chondroitin 500-400 MG tablet Take 1 tablet by mouth 2 (two) times daily.    . hydrochlorothiazide (MICROZIDE) 12.5 MG capsule Take 1 capsule (12.5 mg total) by mouth daily. (Patient not taking: Reported on 12/21/2015) 90 capsule 3  . latanoprost (XALATAN) 0.005 % ophthalmic solution Place 1 drop into both eyes at bedtime.     . lidocaine (LIDODERM) 5 % Place 1 patch onto the skin daily. Remove & Discard patch within 12 hours or as directed by MD (Patient not taking: Reported on 10/05/2017) 30 patch 0  . lisinopril (PRINIVIL,ZESTRIL) 20 MG tablet take 1 tablet by mouth once daily 90 tablet 3  . loratadine (CLARITIN) 10 MG tablet Take 10 mg by mouth daily as needed for allergies.     . Multiple Vitamins-Minerals (MULTIVITAMIN WITH MINERALS) tablet Take 1 tablet by mouth daily.    Marland Kitchen  Omega-3 Fatty Acids (FISH OIL) 1000 MG CAPS Take 1 capsule by mouth 2 (two) times daily.    Marland Kitchen oxybutynin (DITROPAN-XL) 10 MG 24 hr tablet Take 1 tablet (10 mg total) by mouth daily. (Patient not taking: Reported on 10/25/2015) 30 tablet 6  . oxyCODONE (OXY IR/ROXICODONE) 5 MG immediate release tablet Take 1 tablet (5 mg total) by mouth every 6 (six) hours as needed for severe pain. (Patient not taking: Reported on 11/23/2015) 30 tablet 0  . sertraline (ZOLOFT) 100 MG tablet Take 1 tablet (100 mg total) by mouth daily. (Patient not taking: Reported on 10/05/2017) 90 tablet 3  . solifenacin (VESICARE) 10 MG tablet Take  10 mg by mouth daily. Reported on 12/21/2015    . tiZANidine (ZANAFLEX) 4 MG tablet Take 1 tablet (4 mg total) by mouth every 8 (eight) hours as needed for muscle spasms. (Patient not taking: Reported on 11/23/2015) 60 tablet 1  . vitamin B-12 (CYANOCOBALAMIN) 100 MCG tablet Take 100 mcg by mouth daily.     No current facility-administered medications on file prior to visit.     Past Surgical History:  Procedure Laterality Date  . BACK SURGERY    . CHOLECYSTECTOMY  1993  . COLONOSCOPY    . EYE SURGERY     Cataract Surgery Bilateral, 5 years ago  . MAXIMUM ACCESS (MAS)POSTERIOR LUMBAR INTERBODY FUSION (PLIF) 1 LEVEL N/A 03/17/2015   Procedure: Lumbar five -sacral one Maximum access posterior lumbar interbody fusion/Lumbar two-three  Laminectomy and extension of instrumentation to Sacral-one;  Surgeon: Tia Alert, MD;  Location: [facility];  Service: Neurosurgery;  Laterality: N/A;  . MAXIMUM ACCESS (MAS)POSTERIOR LUMBAR INTERBODY FUSION (PLIF) 2 LEVEL N/A 11/27/2014   Procedure: LUMBAR THREE-FOUR, LUMBAR FOUR-FIVE MAXIMUM ACCESS POSTERIOR LUMBAR INTERBODY FUSION;  Surgeon: Tia Alert, MD;  Location: [facility];  Service: Neurosurgery;  Laterality: N/A;  L3-4 L4-5 MAXIMUM ACCESS POSTERIOR LUMBAR INTERBODY FUSION  . VAGINAL HYSTERECTOMY  1970s   uterine prolapse    No Known Allergies  Social History   Socioeconomic History  . Marital status: Widowed    Spouse name: Not on file  . Number of children: 3  . Years of education: Not on file  . Highest education level: Not on file  Social Needs  . Financial resource strain: Not on file  . Food insecurity - worry: Not on file  . Food insecurity - inability: Not on file  . Transportation needs - medical: Not on file  . Transportation needs - non-medical: Not on file  Occupational History    Employer: RETIRED  Tobacco Use  . Smoking status: Never Smoker  . Smokeless tobacco: Never Used  Substance and Sexual Activity  . Alcohol  use: Yes    Alcohol/week: 0.6 oz    Types: 1 Glasses of wine per week    Comment: 1 glass per night  . Drug use: No  . Sexual activity: No  Other Topics Concern  . Not on file  Social History Narrative   Husband died suicide age 33s.  Currently in long term romantic relationship.  Support from 2 daughters and good relationship with grandchildren.  Other daughter has mental health issues, strained relationship.  Pt feels she is not making good decisions re: children and has tried to get CPS involved without relief.   1/3/13Lucila Maine (of daughter with mental health issues) facing jail time for selling drugs.  Pt has tried to support him and offered help with rehab or with  changing colleges, but he refuses.      Family History  Problem Relation Age of Onset  . Mental illness Daughter   . Drug abuse Grandchild   . Heart disease Mother 2686       died MI age 78  . Hyperlipidemia Sister   . Hypertension Sister   . Cancer Father        Primary Cell Liver Cancer  . Diabetes Neg Hx     BP (!) 160/86   Pulse 87   Ht 5\' 5"  (1.651 m)   Wt 134 lb (60.8 kg)   BMI 22.30 kg/m   Review of Systems: See HPI above.     Objective:  Physical Exam:  Gen: NAD, comfortable in exam room.  Left shoulder: No swelling, ecchymoses.  No gross deformity. No TTP. FROM with mild painful arc. Negative Hawkins, Neers. Negative Yergasons. Strength 5-/5 with empty can and 5/5 resisted internal/external rotation. Negative apprehension. NV intact distally.  Right shoulder: No swelling, ecchymoses.  No gross deformity. No TTP. FROM. Negative Hawkins, Neers. Negative Yergasons. Strength 5/5 with empty can and resisted internal/external rotation. NV intact distally.  Right foot/ankle: No gross deformity, swelling, ecchymoses FROM with 5/5 strength. TTP mildly base 5th metatarsal.  No other tenderness. Negative ant drawer and talar tilt.   Negative syndesmotic compression. Thompsons test  negative. NV intact distally.  Assessment & Plan:  1. Bilateral shoulder pain - Clinically much improved compared to visit about 2 weeks ago from left partial proximal biceps tendon and partial supraspinatus tears, old right supraspinatus full thickness tear.  Continue home exercises with theraband for 6 more weeks 3-4 times a week.  Tylenol and/or voltaren gel.  2. Right 5th metatarsal avulsion fracture - clinically improving about 6 weeks out from fracture.  Continue with well supportive shoes until pain resolution.  F/u in 1 month to 6 weeks.  Tylenol and/or voltaren gel if needed.

## 2017-12-25 NOTE — Assessment & Plan Note (Signed)
Right 5th metatarsal avulsion fracture - clinically improving about 6 weeks out from fracture.  Continue with well supportive shoes until pain resolution.  F/u in 1 month to 6 weeks.  Tylenol and/or voltaren gel if needed.

## 2017-12-25 NOTE — Assessment & Plan Note (Signed)
Clinically much improved compared to visit about 2 weeks ago from left partial proximal biceps tendon and partial supraspinatus tears, old right supraspinatus full thickness tear.  Continue home exercises with theraband for 6 more weeks 3-4 times a week.  Tylenol and/or voltaren gel.

## 2017-12-25 NOTE — Patient Instructions (Signed)
Continue your shoulder exercises 3-4 times a week for about 6 more weeks. Tylenol and/or voltaren gel as needed for pain. Continue wearing supportive shoes until pain has resolved. Follow up with me in 1 month to 6 weeks for reevaluation.

## 2018-01-22 ENCOUNTER — Ambulatory Visit: Payer: Medicare Other | Admitting: Family Medicine

## 2018-02-21 IMAGING — DX DG FOOT COMPLETE 3+V*R*
3 series · 3 of 3 positions shown · non-contrast
Comparison: 10/16/2005.

CLINICAL DATA: Pain and tenderness right fifth digit. No specific
injury.

EXAM:
RIGHT FOOT COMPLETE - 3+ VIEW

[foot ap]
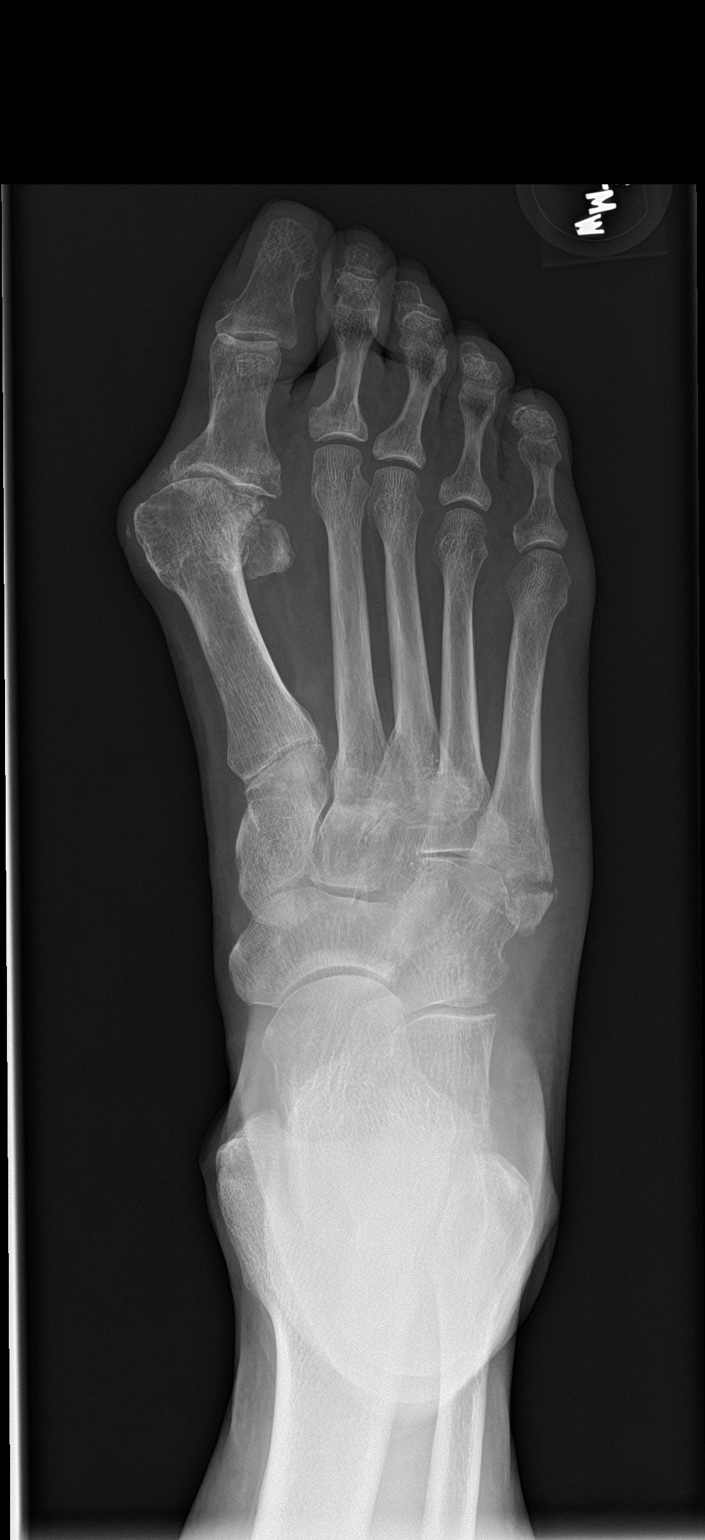

[foot obl]
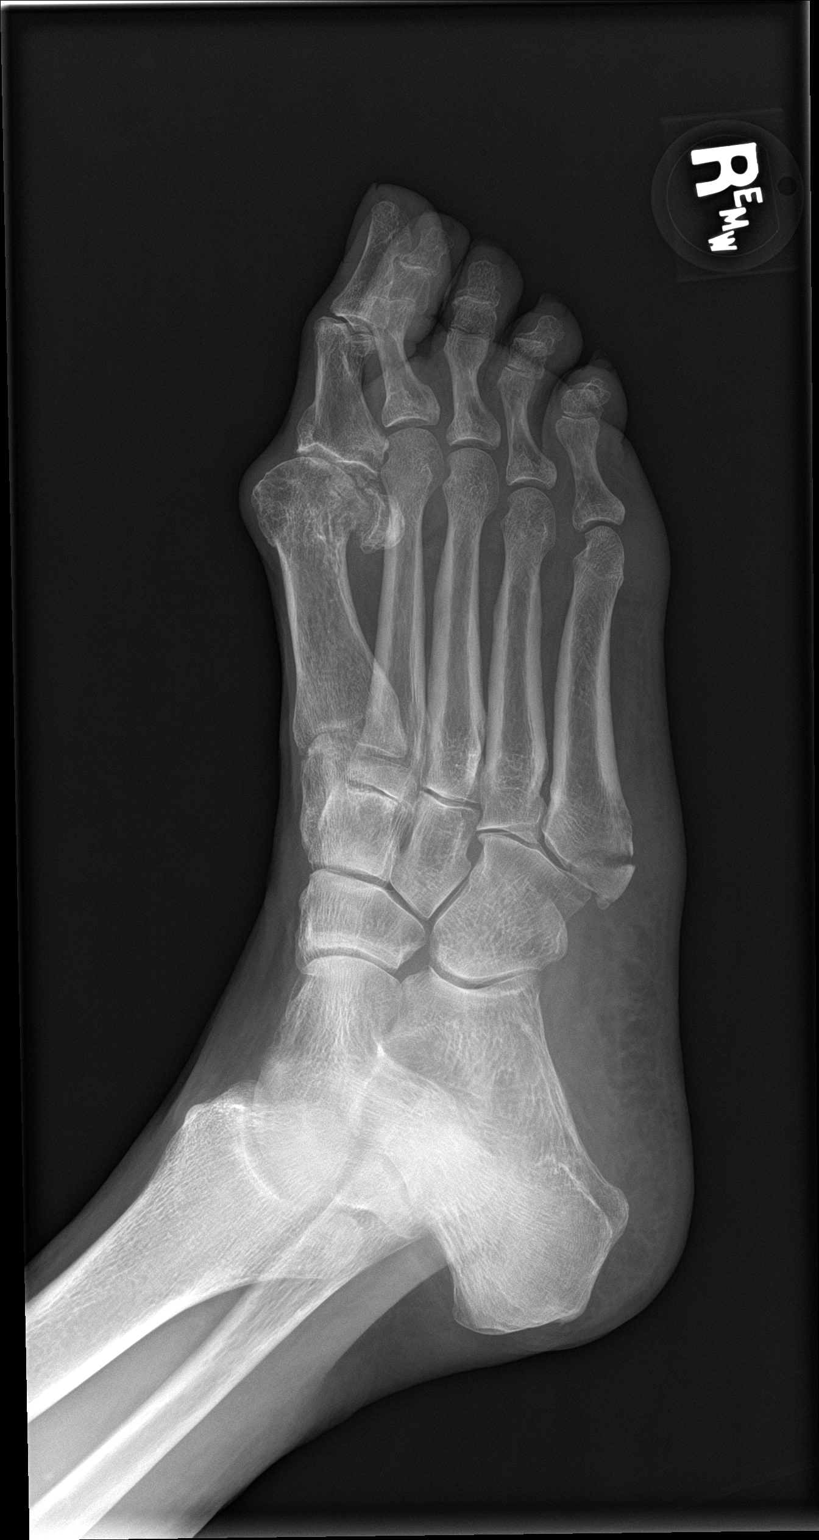

[foot lat]
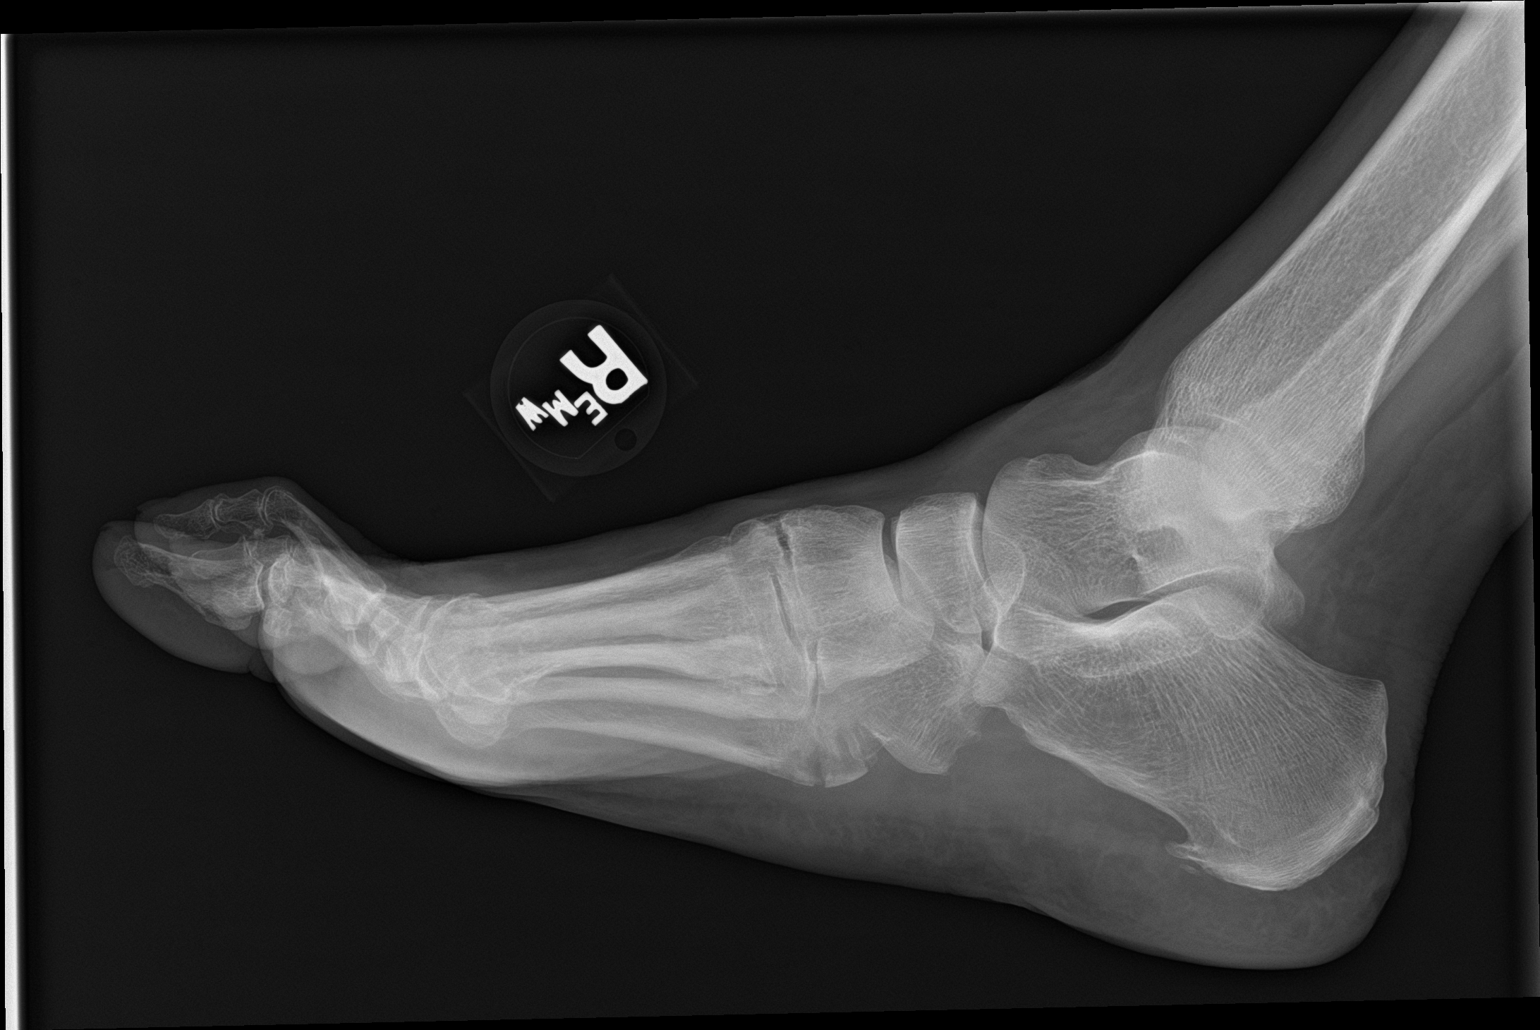

[3 of 3 positions shown; findings below may reference images not displayed]

FINDINGS: Fracture of the base of the right fifth metatarsal is noted.
Fracture is displaced. Old fracture of the base of the proximal
phalanx of the right first digit. Severe degenerative changes first
metatarsophalangeal joint. No acute bony abnormality.
IMPRESSION: Displaced fracture of the base of the right fifth metatarsal.

## 2018-02-28 ENCOUNTER — Encounter: Payer: Self-pay | Admitting: Physical Therapy

## 2018-02-28 NOTE — Therapy (Unsigned)
[facility] [address] [place], [place], [address] Phone: [phone]   Fax:  [phone]  Patient Details  Name: Melissa Gonzales MRN: [id number] Date of Birth: 1941/02/24 Referring Provider:  No ref. provider found  Encounter Date: 02/28/2018  PHYSICAL THERAPY DISCHARGE SUMMARY  Visits from Start of Care: 10  Current functional level related to goals / functional outcomes: Did not return after foot fracture; see last progress note   Remaining deficits: Did not return after foot fracture; see last progress note   Education / Equipment: Did not return after foot fracture; see last progress note  Plan: Patient agrees to discharge.  Patient goals were not met. Patient is being discharged due to not returning since the last visit.  ?????       Rexanne Mano , PT 02/28/2018, 2:40 PM  [facility] [address] [place], [place], [address] Phone: [phone]   Fax:  [phone]

## 2018-05-20 ENCOUNTER — Other Ambulatory Visit: Payer: Self-pay | Admitting: Internal Medicine

## 2018-05-20 DIAGNOSIS — Z1231 Encounter for screening mammogram for malignant neoplasm of breast: Secondary | ICD-10-CM

## 2018-05-21 ENCOUNTER — Ambulatory Visit
Admission: RE | Admit: 2018-05-21 | Discharge: 2018-05-21 | Disposition: A | Discharge status: Home / Self Care | Payer: Medicare Other | Source: Ambulatory Visit | Attending: Internal Medicine | Admitting: Internal Medicine

## 2018-05-21 DIAGNOSIS — Z1231 Encounter for screening mammogram for malignant neoplasm of breast: Secondary | ICD-10-CM

## 2018-12-14 ENCOUNTER — Encounter (HOSPITAL_COMMUNITY): Payer: Self-pay

## 2018-12-14 ENCOUNTER — Inpatient Hospital Stay (HOSPITAL_COMMUNITY)
Admission: EM | Admit: 2018-12-14 | Discharge: 2018-12-28 | DRG: 234 | Disposition: A | Discharge status: Skilled Nursing Facility | Payer: Medicare Other | Attending: Cardiothoracic Surgery | Admitting: Cardiothoracic Surgery

## 2018-12-14 ENCOUNTER — Emergency Department (HOSPITAL_COMMUNITY): Payer: Medicare Other

## 2018-12-14 ENCOUNTER — Other Ambulatory Visit: Payer: Self-pay

## 2018-12-14 DIAGNOSIS — E7800 Pure hypercholesterolemia, unspecified: Secondary | ICD-10-CM | POA: Diagnosis present

## 2018-12-14 DIAGNOSIS — F419 Anxiety disorder, unspecified: Secondary | ICD-10-CM | POA: Diagnosis present

## 2018-12-14 DIAGNOSIS — J90 Pleural effusion, not elsewhere classified: Secondary | ICD-10-CM | POA: Diagnosis not present

## 2018-12-14 DIAGNOSIS — D62 Acute posthemorrhagic anemia: Secondary | ICD-10-CM | POA: Diagnosis not present

## 2018-12-14 DIAGNOSIS — R109 Unspecified abdominal pain: Secondary | ICD-10-CM

## 2018-12-14 DIAGNOSIS — E8770 Fluid overload, unspecified: Secondary | ICD-10-CM | POA: Diagnosis not present

## 2018-12-14 DIAGNOSIS — Z9181 History of falling: Secondary | ICD-10-CM

## 2018-12-14 DIAGNOSIS — M7122 Synovial cyst of popliteal space [Baker], left knee: Secondary | ICD-10-CM | POA: Diagnosis present

## 2018-12-14 DIAGNOSIS — R7303 Prediabetes: Secondary | ICD-10-CM | POA: Diagnosis present

## 2018-12-14 DIAGNOSIS — Z981 Arthrodesis status: Secondary | ICD-10-CM

## 2018-12-14 DIAGNOSIS — R5383 Other fatigue: Secondary | ICD-10-CM

## 2018-12-14 DIAGNOSIS — H4010X0 Unspecified open-angle glaucoma, stage unspecified: Secondary | ICD-10-CM | POA: Diagnosis present

## 2018-12-14 DIAGNOSIS — K219 Gastro-esophageal reflux disease without esophagitis: Secondary | ICD-10-CM | POA: Diagnosis present

## 2018-12-14 DIAGNOSIS — I25110 Atherosclerotic heart disease of native coronary artery with unstable angina pectoris: Secondary | ICD-10-CM | POA: Diagnosis not present

## 2018-12-14 DIAGNOSIS — N3010 Interstitial cystitis (chronic) without hematuria: Secondary | ICD-10-CM | POA: Diagnosis present

## 2018-12-14 DIAGNOSIS — Z8249 Family history of ischemic heart disease and other diseases of the circulatory system: Secondary | ICD-10-CM

## 2018-12-14 DIAGNOSIS — E785 Hyperlipidemia, unspecified: Secondary | ICD-10-CM | POA: Diagnosis present

## 2018-12-14 DIAGNOSIS — R079 Chest pain, unspecified: Secondary | ICD-10-CM

## 2018-12-14 DIAGNOSIS — I10 Essential (primary) hypertension: Secondary | ICD-10-CM | POA: Diagnosis present

## 2018-12-14 DIAGNOSIS — I493 Ventricular premature depolarization: Secondary | ICD-10-CM | POA: Diagnosis present

## 2018-12-14 DIAGNOSIS — R0600 Dyspnea, unspecified: Secondary | ICD-10-CM

## 2018-12-14 DIAGNOSIS — R0602 Shortness of breath: Secondary | ICD-10-CM

## 2018-12-14 DIAGNOSIS — Z951 Presence of aortocoronary bypass graft: Secondary | ICD-10-CM

## 2018-12-14 DIAGNOSIS — Z7722 Contact with and (suspected) exposure to environmental tobacco smoke (acute) (chronic): Secondary | ICD-10-CM | POA: Diagnosis present

## 2018-12-14 DIAGNOSIS — E039 Hypothyroidism, unspecified: Secondary | ICD-10-CM | POA: Diagnosis present

## 2018-12-14 DIAGNOSIS — Z8349 Family history of other endocrine, nutritional and metabolic diseases: Secondary | ICD-10-CM

## 2018-12-14 DIAGNOSIS — R002 Palpitations: Secondary | ICD-10-CM | POA: Diagnosis present

## 2018-12-14 DIAGNOSIS — I2510 Atherosclerotic heart disease of native coronary artery without angina pectoris: Secondary | ICD-10-CM

## 2018-12-14 DIAGNOSIS — Z8551 Personal history of malignant neoplasm of bladder: Secondary | ICD-10-CM

## 2018-12-14 DIAGNOSIS — I959 Hypotension, unspecified: Secondary | ICD-10-CM | POA: Diagnosis not present

## 2018-12-14 HISTORY — DX: Polyneuropathy, unspecified: G62.9

## 2018-12-14 LAB — BASIC METABOLIC PANEL
Anion gap: 12 (ref 5–15)
BUN: 14 mg/dL (ref 8–23)
CALCIUM: 9.2 mg/dL (ref 8.9–10.3)
CO2: 24 mmol/L (ref 22–32)
CREATININE: 0.6 mg/dL (ref 0.44–1.00)
Chloride: 102 mmol/L (ref 98–111)
GFR calc Af Amer: >60 mL/min (ref >60)
GLUCOSE: 106 mg/dL — AB (ref 70–99)
POTASSIUM: 3.5 mmol/L (ref 3.5–5.1)
SODIUM: 138 mmol/L (ref 135–145)

## 2018-12-14 LAB — CBC WITH DIFFERENTIAL/PLATELET
ABS IMMATURE GRANULOCYTES: 0.04 10*3/uL (ref 0.00–0.07)
Basophils Absolute: 0 10*3/uL (ref 0.0–0.1)
Basophils Relative: 0 %
Eosinophils Absolute: 0.2 10*3/uL (ref 0.0–0.5)
Eosinophils Relative: 2 %
HCT: 42.2 % (ref 36.0–46.0)
Hemoglobin: 14.4 g/dL (ref 12.0–15.0)
Immature Granulocytes: 1 %
LYMPHS PCT: 19 %
Lymphs Abs: 1.3 10*3/uL (ref 0.7–4.0)
MCH: 34.7 pg — AB (ref 26.0–34.0)
MCHC: 34.1 g/dL (ref 30.0–36.0)
MCV: 101.7 fL — AB (ref 80.0–100.0)
MONO ABS: 0.8 10*3/uL (ref 0.1–1.0)
Monocytes Relative: 11 %
NEUTROS ABS: 4.5 10*3/uL (ref 1.7–7.7)
Neutrophils Relative %: 67 %
PLATELETS: 228 10*3/uL (ref 150–400)
RBC: 4.15 MIL/uL (ref 3.87–5.11)
RDW: 12.5 % (ref 11.5–15.5)
WBC: 6.8 10*3/uL (ref 4.0–10.5)
nRBC: 0 % (ref 0.0–0.2)

## 2018-12-14 LAB — I-STAT TROPONIN, ED: TROPONIN I, POC: 0 ng/mL (ref 0.00–0.08)

## 2018-12-14 MED ORDER — ASPIRIN 81 MG PO CHEW
324.0000 mg | CHEWABLE_TABLET | Freq: Once | ORAL | Status: AC
  Administered 2018-12-15: 324 mg via ORAL
  Filled 2018-12-14: qty 4

## 2018-12-14 NOTE — ED Triage Notes (Signed)
Pt BIB [facility] for eval of episodic  palpitations x 3 days, more frequent today. Pt reports episode today which involved pain radiating down L arm w/ associated SOB prompting her to call 911. Palpitations resolved on EMS arrival, pt denies CP/SOB/Palpitations on arrival. Reports fatigue. Pt denies recent illness.

## 2018-12-14 NOTE — ED Provider Notes (Signed)
[facility]   CSN: [id number] Arrival date & time: 12/14/18  2210     History   Chief Complaint Chief Complaint  Patient presents with  . Palpitations    HPI Melissa LivingsCarol A Gonzales is a 78 y.o. female.  Patient with PMH of HTN, HL, presents to the ED with a chief complaint of heart palpitations.  She states that she first noticed the palpitations on Thursday, but they worsened significantly tonight.  She reports associated SOB and some dull chest pressure. Reports having some associated lightheadedness. Denies any hx of the same.  No treatments prior to arrival.    The history is provided by the patient. No language interpreter was used.    Past Medical History:  Diagnosis Date  . Anxiety   . Arthritis   . Depression   . Diverticulosis   . GERD (gastroesophageal reflux disease)   . Glaucoma    Bilateral eyes  . Hyperlipidemia   . Hypertension   . Pneumonia    hx of  . Urgency of urination     Patient Active Problem List   Diagnosis Date Noted  . Right foot pain 12/04/2017  . Community acquired pneumonia 10/26/2015  . Parapneumonic effusion   . Intractable back pain 04/09/2015  . Intractable pain 04/08/2015  . S/P lumbar spinal fusion 11/27/2014  . Bilateral shoulder pain 09/02/2014  . Low back pain 09/02/2014  . Axillary pain 07/12/2014  . Wellness examination 05/22/2014  . Insomnia 05/14/2013  . Preventive measure 04/15/2012  . Depressive disorder 01/23/2012  . Urge incontinence 01/23/2012  . ARTHRITIS, HANDS, BILATERAL 03/25/2009  . HYPERCHOLESTEROLEMIA 02/27/2008  . MIGRAINE HEADACHE 02/27/2008  . Essential hypertension 02/27/2008  . Anxiety state 08/01/2007    Past Surgical History:  Procedure Laterality Date  . BACK SURGERY    . CHOLECYSTECTOMY  1993  . COLONOSCOPY    . EYE SURGERY     Cataract Surgery Bilateral, 5 years ago  . MAXIMUM ACCESS (MAS)POSTERIOR LUMBAR INTERBODY FUSION (PLIF) 1 LEVEL  N/A 03/17/2015   Procedure: Lumbar five -sacral one Maximum access posterior lumbar interbody fusion/Lumbar two-three  Laminectomy and extension of instrumentation to Sacral-one;  Surgeon: Tia Alertavid S Jones, MD;  Location: [facility];  Service: Neurosurgery;  Laterality: N/A;  . MAXIMUM ACCESS (MAS)POSTERIOR LUMBAR INTERBODY FUSION (PLIF) 2 LEVEL N/A 11/27/2014   Procedure: LUMBAR THREE-FOUR, LUMBAR FOUR-FIVE MAXIMUM ACCESS POSTERIOR LUMBAR INTERBODY FUSION;  Surgeon: Tia Alertavid S Jones, MD;  Location: [facility];  Service: Neurosurgery;  Laterality: N/A;  L3-4 L4-5 MAXIMUM ACCESS POSTERIOR LUMBAR INTERBODY FUSION  . VAGINAL HYSTERECTOMY  1970s   uterine prolapse     OB History   No obstetric history on file.      Home Medications    Prior to Admission medications   Medication Sig Start Date End Date Taking? Authorizing Provider  ALPHAGAN P 0.1 % SOLN Place 1 drop into both eyes every 12 (twelve) hours.  04/05/12   [provider]  ALPRAZolam (XANAX) 0.25 MG tablet TAKE 1 TABLET BY MOUTH TWICE DAILY AS NEEDED FOR ANXIETY 05/05/15   Rumley, [place] N, DO  atorvastatin (LIPITOR) 40 MG tablet take 1 tablet by mouth once daily Patient taking differently: Take 40 mg by mouth daily at 6 PM.  06/26/14   Rumley, [place] N, DO  azithromycin (ZITHROMAX) 500 MG tablet Take 1 tablet (500 mg total) by mouth daily. Patient not taking: Reported on 12/21/2015 10/29/15   Rolly SalterPatel, Pranav M,  MD  benzonatate (TESSALON) 200 MG capsule Take 1 capsule (200 mg total) by mouth 3 (three) times daily as needed for cough. Patient not taking: Reported on 12/21/2015 10/29/15   Rolly SalterPatel, Pranav M, MD  Calcium Carb-Cholecalciferol (CALCIUM 600 + D PO) Take 1 tablet by mouth 2 (two) times daily.    [provider]  cefpodoxime (VANTIN) 200 MG tablet Take 1 tablet (200 mg total) by mouth every 12 (twelve) hours. Patient not taking: Reported on 12/21/2015 10/29/15   Rolly SalterPatel, Pranav M, MD  diclofenac sodium (VOLTAREN) 1 %  GEL Apply 2 g topically 4 (four) times daily. 12/04/17   Hudnall, Azucena FallenShane R, MD  dorzolamide-timolol (COSOPT) 22.3-6.8 MG/ML ophthalmic solution Place 1 drop into both eyes 2 (two) times daily.  03/20/11   [provider]  esomeprazole (NEXIUM) 40 MG capsule Take 1 capsule (40 mg total) by mouth daily before breakfast. 05/22/14   Rumley, [place] N, DO  glucosamine-chondroitin 500-400 MG tablet Take 1 tablet by mouth 2 (two) times daily.    [provider]  hydrochlorothiazide (MICROZIDE) 12.5 MG capsule Take 1 capsule (12.5 mg total) by mouth daily. Patient not taking: Reported on 12/21/2015 05/05/15   Araceli Boucheumley, [place] N, DO  latanoprost (XALATAN) 0.005 % ophthalmic solution Place 1 drop into both eyes at bedtime.  06/21/14   [provider]  lidocaine (LIDODERM) 5 % Place 1 patch onto the skin daily. Remove & Discard patch within 12 hours or as directed by MD Patient not taking: Reported on 10/05/2017 10/29/15   Rolly SalterPatel, Pranav M, MD  lisinopril (PRINIVIL,ZESTRIL) 20 MG tablet take 1 tablet by mouth once daily 02/14/17   Rodrigo RanPerini, Mark, MD  loratadine (CLARITIN) 10 MG tablet Take 10 mg by mouth daily as needed for allergies.     [provider]  Multiple Vitamins-Minerals (MULTIVITAMIN WITH MINERALS) tablet Take 1 tablet by mouth daily.    [provider]  Omega-3 Fatty Acids (FISH OIL) 1000 MG CAPS Take 1 capsule by mouth 2 (two) times daily.    [provider]  oxybutynin (DITROPAN-XL) 10 MG 24 hr tablet Take 1 tablet (10 mg total) by mouth daily. Patient not taking: Reported on 10/25/2015 05/12/13   [place]da Peaches[place], Donetta Pottsayarmys, MD  oxyCODONE (OXY IR/ROXICODONE) 5 MG immediate release tablet Take 1 tablet (5 mg total) by mouth every 6 (six) hours as needed for severe pain. Patient not taking: Reported on 11/23/2015 10/29/15   Rolly SalterPatel, Pranav M, MD  sertraline (ZOLOFT) 100 MG tablet Take 1 tablet (100 mg total) by mouth daily. Patient not taking: Reported on  10/05/2017 12/06/15   Araceli Boucheumley,  N, DO  solifenacin (VESICARE) 10 MG tablet Take 10 mg by mouth daily. Reported on 12/21/2015    [provider]  tiZANidine (ZANAFLEX) 4 MG tablet Take 1 tablet (4 mg total) by mouth every 8 (eight) hours as needed for muscle spasms. Patient not taking: Reported on 11/23/2015 04/16/15   Tia AlertJones, David S, MD  vitamin B-12 (CYANOCOBALAMIN) 100 MCG tablet Take 100 mcg by mouth daily.    [provider]    Family History Family History  Problem Relation Age of Onset  . Mental illness Daughter   . Drug abuse Grandchild   . Heart disease Mother [id number]       died MI age 79  . Hyperlipidemia Sister   . Hypertension Sister   . Cancer Father        Primary Cell Liver Cancer  . Diabetes Neg Hx  Social History Social History   Tobacco Use  . Smoking status: Never Smoker  . Smokeless tobacco: Never Used  Substance Use Topics  . Alcohol use: Yes    Alcohol/week: 1.0 standard drinks    Types: 1 Glasses of wine per week    Comment: 1 glass per night  . Drug use: No     Allergies   Patient has no known allergies.   Review of Systems Review of Systems  All other systems reviewed and are negative.    Physical Exam Updated Vital Signs Ht 5\' 4"  (1.626 m)   Wt 68 kg   SpO2 99%   BMI 25.75 kg/m   Physical Exam Vitals signs and nursing note reviewed.  Constitutional:      Appearance: She is well-developed.  HENT:     Head: Normocephalic and atraumatic.  Eyes:     Conjunctiva/sclera: Conjunctivae normal.     Pupils: Pupils are equal, round, and reactive to light.  Neck:     Musculoskeletal: Normal range of motion and neck supple.  Cardiovascular:     Rate and Rhythm: Normal rate and regular rhythm.     Heart sounds: No murmur. No friction rub. No gallop.   Pulmonary:     Effort: Pulmonary effort is normal. No respiratory distress.     Breath sounds: Normal breath sounds. No wheezing or rales.  Chest:     Chest wall: No  tenderness.  Abdominal:     General: Bowel sounds are normal. There is no distension.     Palpations: Abdomen is soft. There is no mass.     Tenderness: There is no abdominal tenderness. There is no guarding or rebound.  Musculoskeletal: Normal range of motion.        General: No tenderness.  Skin:    General: Skin is warm and dry.  Neurological:     Mental Status: She is alert and oriented to person, place, and time.  Psychiatric:        Behavior: Behavior normal.        Thought Content: Thought content normal.        Judgment: Judgment normal.      ED Treatments / Results  Labs (all labs ordered are listed, but only abnormal results are displayed) Labs Reviewed  CBC WITH DIFFERENTIAL/PLATELET  BASIC METABOLIC PANEL  I-STAT TROPONIN, ED    EKG None  Radiology No results found.  Procedures Procedures (including critical care time)  Medications Ordered in ED Medications - No data to display   Initial Impression / Assessment and Plan / ED Course  I have reviewed the triage vital signs and the nursing notes.  Pertinent labs & imaging results that were available during my care of the patient were reviewed by me and considered in my medical decision making (see chart for details).     Patient with chest pain and some heart palpitations.  Heart palpitations started on Thursday and have been intermittent for the past couple of days.  She states that today she had more persistent symptoms along with some chest pressure and shortness of breath.  No history of ACS, PE, or DVT.  EKG is reassuring, no concerning arrhythmias, or ischemic changes.  Heart score is 4 based on age and risk factors.  Given the worsening symptoms today, I do feel the patient warrants an observation admission.  Appreciate Dr. Adela Glimpse for bringing the patient into the hospital for observation.  Final Clinical Impressions(s) / ED Diagnoses   Final  diagnoses:  Chest pain, unspecified type    ED  Discharge Orders    None       Roxy Horseman, PA-C 12/15/18 0045    Gerhard Munch, MD 12/15/18 [phone]

## 2018-12-15 ENCOUNTER — Observation Stay (HOSPITAL_BASED_OUTPATIENT_CLINIC_OR_DEPARTMENT_OTHER): Payer: Medicare Other

## 2018-12-15 ENCOUNTER — Observation Stay (HOSPITAL_COMMUNITY): Payer: Medicare Other

## 2018-12-15 DIAGNOSIS — Z01810 Encounter for preprocedural cardiovascular examination: Secondary | ICD-10-CM | POA: Diagnosis not present

## 2018-12-15 DIAGNOSIS — R079 Chest pain, unspecified: Secondary | ICD-10-CM

## 2018-12-15 DIAGNOSIS — R7989 Other specified abnormal findings of blood chemistry: Secondary | ICD-10-CM

## 2018-12-15 DIAGNOSIS — R002 Palpitations: Secondary | ICD-10-CM

## 2018-12-15 DIAGNOSIS — I361 Nonrheumatic tricuspid (valve) insufficiency: Secondary | ICD-10-CM | POA: Diagnosis not present

## 2018-12-15 DIAGNOSIS — I10 Essential (primary) hypertension: Secondary | ICD-10-CM | POA: Diagnosis not present

## 2018-12-15 DIAGNOSIS — E7800 Pure hypercholesterolemia, unspecified: Secondary | ICD-10-CM | POA: Diagnosis not present

## 2018-12-15 LAB — COMPREHENSIVE METABOLIC PANEL
ALBUMIN: 3.7 g/dL (ref 3.5–5.0)
ALT: 40 U/L (ref 0–44)
AST: 41 U/L (ref 15–41)
Alkaline Phosphatase: 62 U/L (ref 38–126)
Anion gap: 11 (ref 5–15)
BUN: 12 mg/dL (ref 8–23)
CO2: 25 mmol/L (ref 22–32)
Calcium: 9.4 mg/dL (ref 8.9–10.3)
Chloride: 102 mmol/L (ref 98–111)
Creatinine, Ser: 0.61 mg/dL (ref 0.44–1.00)
GFR calc Af Amer: >60 mL/min (ref >60)
GFR calc non Af Amer: >60 mL/min (ref >60)
Glucose, Bld: 108 mg/dL — ABNORMAL HIGH (ref 70–99)
Potassium: 3.5 mmol/L (ref 3.5–5.1)
Sodium: 138 mmol/L (ref 135–145)
Total Bilirubin: 0.9 mg/dL (ref 0.3–1.2)
Total Protein: 6.6 g/dL (ref 6.5–8.1)

## 2018-12-15 LAB — MAGNESIUM
Magnesium: 2 mg/dL (ref 1.7–2.4)
Magnesium: 2 mg/dL (ref 1.7–2.4)

## 2018-12-15 LAB — CBC
HEMATOCRIT: 42.4 % (ref 36.0–46.0)
Hemoglobin: 14 g/dL (ref 12.0–15.0)
MCH: 33.7 pg (ref 26.0–34.0)
MCHC: 33 g/dL (ref 30.0–36.0)
MCV: 101.9 fL — ABNORMAL HIGH (ref 80.0–100.0)
Platelets: 223 10*3/uL (ref 150–400)
RBC: 4.16 MIL/uL (ref 3.87–5.11)
RDW: 12.3 % (ref 11.5–15.5)
WBC: 8.1 10*3/uL (ref 4.0–10.5)
nRBC: 0 % (ref 0.0–0.2)

## 2018-12-15 LAB — TROPONIN I
Troponin I: <0.03 ng/mL (ref <0.03)
Troponin I: <0.03 ng/mL (ref <0.03)
Troponin I: <0.03 ng/mL (ref <0.03)

## 2018-12-15 LAB — TSH: TSH: 5.337 u[IU]/mL — ABNORMAL HIGH (ref 0.350–4.500)

## 2018-12-15 LAB — LIPID PANEL
CHOLESTEROL: 177 mg/dL (ref 0–200)
HDL: 57 mg/dL (ref >40)
LDL Cholesterol: 97 mg/dL (ref 0–99)
Total CHOL/HDL Ratio: 3.1 RATIO
Triglycerides: 116 mg/dL (ref <150)
VLDL: 23 mg/dL (ref 0–40)

## 2018-12-15 LAB — VITAMIN B12: Vitamin B-12: 3643 pg/mL — ABNORMAL HIGH (ref 180–914)

## 2018-12-15 LAB — HEMOGLOBIN A1C
Hgb A1c MFr Bld: 5.8 % — ABNORMAL HIGH (ref 4.8–5.6)
Mean Plasma Glucose: 119.76 mg/dL

## 2018-12-15 LAB — PHOSPHORUS
Phosphorus: 2.8 mg/dL (ref 2.5–4.6)
Phosphorus: 3.2 mg/dL (ref 2.5–4.6)

## 2018-12-15 LAB — ECHOCARDIOGRAM COMPLETE
Height: 64 in
Weight: 2400 oz

## 2018-12-15 LAB — D-DIMER, QUANTITATIVE: D-Dimer, Quant: 1.51 ug/mL-FEU — ABNORMAL HIGH (ref 0.00–0.50)

## 2018-12-15 LAB — T4, FREE: Free T4: 0.97 ng/dL (ref 0.82–1.77)

## 2018-12-15 MED ORDER — ENOXAPARIN SODIUM 40 MG/0.4ML ~~LOC~~ SOLN
40.0000 mg | SUBCUTANEOUS | Status: DC
  Administered 2018-12-15 – 2018-12-16 (×2): 40 mg via SUBCUTANEOUS
  Filled 2018-12-15 (×3): qty 0.4

## 2018-12-15 MED ORDER — ACETAMINOPHEN 325 MG PO TABS
650.0000 mg | ORAL_TABLET | Freq: Four times a day (QID) | ORAL | Status: DC | PRN
  Administered 2018-12-16: 650 mg via ORAL
  Filled 2018-12-15: qty 2

## 2018-12-15 MED ORDER — ATORVASTATIN CALCIUM 80 MG PO TABS
80.0000 mg | ORAL_TABLET | Freq: Every day | ORAL | Status: DC
  Administered 2018-12-15 – 2018-12-16 (×2): 80 mg via ORAL
  Filled 2018-12-15 (×2): qty 1

## 2018-12-15 MED ORDER — IOPAMIDOL (ISOVUE-370) INJECTION 76%
100.0000 mL | Freq: Once | INTRAVENOUS | Status: AC | PRN
  Administered 2018-12-15: 100 mL via INTRAVENOUS

## 2018-12-15 MED ORDER — POTASSIUM CHLORIDE CRYS ER 20 MEQ PO TBCR
40.0000 meq | EXTENDED_RELEASE_TABLET | Freq: Once | ORAL | Status: AC
  Administered 2018-12-15: 40 meq via ORAL
  Filled 2018-12-15: qty 2

## 2018-12-15 MED ORDER — NITROFURANTOIN MACROCRYSTAL 100 MG PO CAPS: 100.0000 mg | ORAL_CAPSULE | Freq: Every day | ORAL | Status: DC

## 2018-12-15 MED ORDER — NITROFURANTOIN MACROCRYSTAL 100 MG PO CAPS
100.0000 mg | ORAL_CAPSULE | Freq: Every day | ORAL | Status: DC
  Filled 2018-12-15: qty 1

## 2018-12-15 MED ORDER — NITROFURANTOIN MACROCRYSTAL 50 MG PO CAPS
100.0000 mg | ORAL_CAPSULE | Freq: Every day | ORAL | Status: DC
  Administered 2018-12-15 – 2018-12-18 (×4): 100 mg via ORAL
  Filled 2018-12-15 (×6): qty 2

## 2018-12-15 MED ORDER — DORZOLAMIDE HCL-TIMOLOL MAL 2-0.5 % OP SOLN
1.0000 [drp] | Freq: Two times a day (BID) | OPHTHALMIC | Status: DC
  Administered 2018-12-15 – 2018-12-18 (×7): 1 [drp] via OPHTHALMIC
  Filled 2018-12-15: qty 10

## 2018-12-15 MED ORDER — ACETAMINOPHEN 650 MG RE SUPP: 650.0000 mg | Freq: Four times a day (QID) | RECTAL | Status: DC | PRN

## 2018-12-15 MED ORDER — NETARSUDIL-LATANOPROST 0.02-0.005 % OP SOLN: 1.0000 [drp] | Freq: Every day | OPHTHALMIC | Status: DC

## 2018-12-15 MED ORDER — PANTOPRAZOLE SODIUM 40 MG PO TBEC
40.0000 mg | DELAYED_RELEASE_TABLET | Freq: Every day | ORAL | Status: DC
  Administered 2018-12-15 – 2018-12-18 (×4): 40 mg via ORAL
  Filled 2018-12-15 (×4): qty 1

## 2018-12-15 MED ORDER — SODIUM CHLORIDE 0.9 % IV SOLN
INTRAVENOUS | Status: AC
  Administered 2018-12-15: 04:00:00 via INTRAVENOUS

## 2018-12-15 MED ORDER — ONDANSETRON HCL 4 MG/2ML IJ SOLN: 4.0000 mg | Freq: Four times a day (QID) | INTRAMUSCULAR | Status: DC | PRN

## 2018-12-15 MED ORDER — ALPRAZOLAM 0.25 MG PO TABS
0.2500 mg | ORAL_TABLET | Freq: Two times a day (BID) | ORAL | Status: DC | PRN
  Administered 2018-12-16 – 2018-12-18 (×4): 0.25 mg via ORAL
  Filled 2018-12-15 (×4): qty 1

## 2018-12-15 MED ORDER — LATANOPROST 0.005 % OP SOLN
1.0000 [drp] | Freq: Every day | OPHTHALMIC | Status: DC
  Administered 2018-12-15 – 2018-12-18 (×4): 1 [drp] via OPHTHALMIC
  Filled 2018-12-15: qty 2.5

## 2018-12-15 MED ORDER — IOPAMIDOL (ISOVUE-370) INJECTION 76%
INTRAVENOUS | Status: AC
  Filled 2018-12-15: qty 100

## 2018-12-15 MED ORDER — LEVOTHYROXINE SODIUM 25 MCG PO TABS
25.0000 ug | ORAL_TABLET | Freq: Every day | ORAL | Status: DC
  Administered 2018-12-16 – 2018-12-18 (×3): 25 ug via ORAL
  Filled 2018-12-15 (×3): qty 1

## 2018-12-15 MED ORDER — ASPIRIN EC 81 MG PO TBEC
81.0000 mg | DELAYED_RELEASE_TABLET | Freq: Every day | ORAL | Status: DC
  Administered 2018-12-15 – 2018-12-16 (×2): 81 mg via ORAL
  Filled 2018-12-15 (×2): qty 1

## 2018-12-15 MED ORDER — BRIMONIDINE TARTRATE 0.15 % OP SOLN
1.0000 [drp] | Freq: Two times a day (BID) | OPHTHALMIC | Status: DC
  Administered 2018-12-15 – 2018-12-18 (×7): 1 [drp] via OPHTHALMIC
  Filled 2018-12-15: qty 5

## 2018-12-15 MED ORDER — ONDANSETRON HCL 4 MG PO TABS: 4.0000 mg | ORAL_TABLET | Freq: Four times a day (QID) | ORAL | Status: DC | PRN

## 2018-12-15 MED ORDER — HYDROCODONE-ACETAMINOPHEN 5-325 MG PO TABS: 1.0000 | ORAL_TABLET | ORAL | Status: DC | PRN

## 2018-12-15 MED ORDER — LEVOTHYROXINE SODIUM 50 MCG PO TABS
50.0000 ug | ORAL_TABLET | Freq: Every day | ORAL | Status: DC
  Administered 2018-12-15: 50 ug via ORAL
  Filled 2018-12-15: qty 1

## 2018-12-15 MED ORDER — LEVOTHYROXINE SODIUM 25 MCG PO TABS: 25.0000 ug | ORAL_TABLET | Freq: Every day | ORAL | Status: DC

## 2018-12-15 MED ORDER — PENTOSAN POLYSULFATE SODIUM 100 MG PO CAPS
100.0000 mg | ORAL_CAPSULE | Freq: Three times a day (TID) | ORAL | Status: DC
  Administered 2018-12-15 – 2018-12-18 (×11): 100 mg via ORAL
  Filled 2018-12-15 (×12): qty 1

## 2018-12-15 NOTE — ED Notes (Signed)
Pt ambulated to restroom, tolerated well.

## 2018-12-15 NOTE — Progress Notes (Signed)
  Echocardiogram 2D Echocardiogram has been performed.  Melissa Gonzales 12/15/2018, 3:11 PM

## 2018-12-15 NOTE — H&P (Signed)
[name] INO:[id number] DOB: 09-07-1941 DOA: 12/14/2018     PCP: Rodrigo Ran, MD   Outpatient Specialists:   [facility]   Urology Dr. Logan Bores  Patient arrived to ER on 12/14/18 at 2210  Patient coming from: home Lives alone,       Chief Complaint:  Chief Complaint  Patient presents with  . Palpitations    HPI: Melissa Gonzales is a 78 y.o. female with medical history significant of interstitial cystitis, open angle glaucoma, bladder cancer and anxiety, GERD, HLD, HTN    Presented with palpitations for the past 3 days but have been getting more frequent.  Today she had an episode of chest pain radiating down her left arm associated shortness of breath was severe enough that she called 911.  By time EMS arrived palpitations have resolved.  No chest pain at this time but have been feeling overall weak feeling overall lightheaded no history of prior in the past.  Describes chest discomfort that is coming and going sometimes at rest. Describes it as pressure like but this PM it was more of a pain, Chest pain occurred at rest no diaphoresis no nausea discomfort lasting less than 5 min.  Reports extra stress in her life  No hx of CAD never smoker But has second hand smoke exposure   no recent travel, have had left leg swelling  Reports severe fatigue for the past few days associated with shortness of breath.   Regarding pertinent Chronic problems:   History of grade 3 transitional cell carcinoma followed by urology last cystoscopy was okay in September 2019 While in ER: Currently chest pain-free work-up so far unremarkable  The following Work up has been ordered so far:  Orders Placed This Encounter  Procedures  . DG Chest 2 View  . CBC with Differential/Platelet  . Basic metabolic panel  . TSH  . Troponin I - Now Then Q6H  . D-dimer, quantitative (not at [facility])  . Vital signs  . Cardiac monitoring  . Consult to hospitalist  . I-stat troponin, ED  . EKG 12-Lead  .  EKG 12-Lead  . Place in observation (patient's expected length of stay will be less than 2 midnights)    Following Medications were ordered in ER: Medications  aspirin chewable tablet 324 mg (324 mg Oral Given 12/15/18 0000)    Significant initial  Findings: Abnormal Labs Reviewed  CBC WITH DIFFERENTIAL/PLATELET - Abnormal; Notable for the following components:      Result Value   MCV 101.7 (*)    MCH 34.7 (*)    All other components within normal limits  BASIC METABOLIC PANEL - Abnormal; Notable for the following components:   Glucose, Bld 106 (*)    All other components within normal limits     Lactic Acid, Venous    Component Value Date/Time   LATICACIDVEN 0.81 10/25/2015 2115    Na 138 K 3.5  Cr  stable,   Lab Results  Component Value Date   CREATININE 0.60 12/14/2018   CREATININE 0.53 10/29/2015   CREATININE 0.54 10/28/2015     Trop 0.00 TSH 5.337  WBC 6.8  HG/HCT   Stable     Component Value Date/Time   HGB 14.4 12/14/2018 2222   HCT 42.2 12/14/2018 2222     Troponin (Point of Care Test) Recent Labs    12/14/18 2237  TROPIPOC 0.00       UA not ordered    CXR -  NON acute  ECG:  Personally reviewed by me showing: HR : 78  Rhythm:  NSR    no evidence of ischemic changes QTC 465     ED Triage Vitals  Enc Vitals Group     BP 12/14/18 2231 (!) 160/69     Pulse Rate 12/14/18 2231 81     Resp 12/14/18 2231 15     Temp 12/14/18 2315 98 F (36.7 C)     Temp Source 12/14/18 2315 Oral     SpO2 12/14/18 2213 98 %     Weight 12/14/18 2216 150 lb (68 kg)     Height 12/14/18 2216 5\' 4"  (1.626 m)     Head Circumference --      Peak Flow --      Pain Score 12/14/18 2216 0     Pain Loc --      Pain Edu? --      Excl. in GC? --   TMAX(24)@       Latest  Blood pressure (!) 158/80, pulse 81, temperature 98 F (36.7 C), temperature source Oral, resp. rate 18, height 5\' 4"  (1.626 m), weight 68 kg, SpO2 95 %.      Hospitalist was called  for admission for polpitations and chest pain   Review of Systems:    Pertinent positives include:palpitations. , fatigue, chest pain, Constitutional:  No weight loss, night sweats, Fevers, chills weight loss  HEENT:  No headaches, Difficulty swallowing,Tooth/dental problems,Sore throat,  No sneezing, itching, ear ache, nasal congestion, post nasal drip,  Cardio-vascular:  No  Orthopnea, PND, anasarca, dizziness, no Bilateral lower extremity swelling  GI:  No heartburn, indigestion, abdominal pain, nausea, vomiting, diarrhea, change in bowel habits, loss of appetite, melena, blood in stool, hematemesis Resp:  no shortness of breath at rest. No dyspnea on exertion, No excess mucus, no productive cough, No non-productive cough, No coughing up of blood.No change in color of mucus.No wheezing. Skin:  no rash or lesions. No jaundice GU:  no dysuria, change in color of urine, no urgency or frequency. No straining to urinate.  No flank pain.  Musculoskeletal:  No joint pain or no joint swelling. No decreased range of motion. No back pain.  Psych:  No change in mood or affect. No depression or anxiety. No memory loss.  Neuro: no localizing neurological complaints, no tingling, no weakness, no double vision, no gait abnormality, no slurred speech, no confusion  All systems reviewed and apart from HOPI all are negative  Past Medical History:   Past Medical History:  Diagnosis Date  . Anxiety   . Arthritis   . Depression   . Diverticulosis   . GERD (gastroesophageal reflux disease)   . Glaucoma    Bilateral eyes  . Hyperlipidemia   . Hypertension   . Pneumonia    hx of  . Urgency of urination       Past Surgical History:  Procedure Laterality Date  . BACK SURGERY    . CHOLECYSTECTOMY  1993  . COLONOSCOPY    . EYE SURGERY     Cataract Surgery Bilateral, 5 years ago  . MAXIMUM ACCESS (MAS)POSTERIOR LUMBAR INTERBODY FUSION (PLIF) 1 LEVEL N/A 03/17/2015   Procedure: Lumbar  five -sacral one Maximum access posterior lumbar interbody fusion/Lumbar two-three  Laminectomy and extension of instrumentation to Sacral-one;  Surgeon: Tia Alertavid S Jones, MD;  Location: [facility];  Service: Neurosurgery;  Laterality: N/A;  . MAXIMUM ACCESS (MAS)POSTERIOR LUMBAR INTERBODY FUSION (PLIF) 2 LEVEL N/A 11/27/2014  Procedure: LUMBAR THREE-FOUR, LUMBAR FOUR-FIVE MAXIMUM ACCESS POSTERIOR LUMBAR INTERBODY FUSION;  Surgeon: Tia Alertavid S Jones, MD;  Location: [facility];  Service: Neurosurgery;  Laterality: N/A;  L3-4 L4-5 MAXIMUM ACCESS POSTERIOR LUMBAR INTERBODY FUSION  . VAGINAL HYSTERECTOMY  1970s   uterine prolapse    Social History:  Ambulatory   independently      reports that she has never smoked. She has never used smokeless tobacco. She reports current alcohol use of about 1.0 standard drinks of alcohol per week. She reports that she does not use drugs.     Family History:   Family History  Problem Relation Age of Onset  . Mental illness Daughter   . Drug abuse Grandchild   . Heart disease Mother [id number]       died MI age 79  . Hyperlipidemia Sister   . Hypertension Sister   . Cancer Father        Primary Cell Liver Cancer  . Diabetes Neg Hx     Allergies: No Known Allergies   Prior to Admission medications   Medication Sig Start Date End Date Taking? Authorizing Provider  ALPHAGAN P 0.1 % SOLN Place 1 drop into both eyes every 12 (twelve) hours.  04/05/12   [provider]  ALPRAZolam (XANAX) 0.25 MG tablet TAKE 1 TABLET BY MOUTH TWICE DAILY AS NEEDED FOR ANXIETY 05/05/15   Rumley, [place] N, DO  atorvastatin (LIPITOR) 40 MG tablet take 1 tablet by mouth once daily Patient taking differently: Take 40 mg by mouth daily at 6 PM.  06/26/14   Rumley,  N, DO  azithromycin (ZITHROMAX) 500 MG tablet Take 1 tablet (500 mg total) by mouth daily. Patient not taking: Reported on 12/21/2015 10/29/15   Rolly SalterPatel, Pranav M, MD  benzonatate (TESSALON) 200 MG capsule Take 1  capsule (200 mg total) by mouth 3 (three) times daily as needed for cough. Patient not taking: Reported on 12/21/2015 10/29/15   Rolly SalterPatel, Pranav M, MD  Calcium Carb-Cholecalciferol (CALCIUM 600 + D PO) Take 1 tablet by mouth 2 (two) times daily.    [provider]  cefpodoxime (VANTIN) 200 MG tablet Take 1 tablet (200 mg total) by mouth every 12 (twelve) hours. Patient not taking: Reported on 12/21/2015 10/29/15   Rolly SalterPatel, Pranav M, MD  diclofenac sodium (VOLTAREN) 1 % GEL Apply 2 g topically 4 (four) times daily. 12/04/17   Hudnall, Azucena FallenShane R, MD  dorzolamide-timolol (COSOPT) 22.3-6.8 MG/ML ophthalmic solution Place 1 drop into both eyes 2 (two) times daily.  03/20/11   [provider]  esomeprazole (NEXIUM) 40 MG capsule Take 1 capsule (40 mg total) by mouth daily before breakfast. 05/22/14   Rumley,  N, DO  glucosamine-chondroitin 500-400 MG tablet Take 1 tablet by mouth 2 (two) times daily.    [provider]  hydrochlorothiazide (MICROZIDE) 12.5 MG capsule Take 1 capsule (12.5 mg total) by mouth daily. Patient not taking: Reported on 12/21/2015 05/05/15   Araceli Boucheumley,  N, DO  latanoprost (XALATAN) 0.005 % ophthalmic solution Place 1 drop into both eyes at bedtime.  06/21/14   [provider]  lidocaine (LIDODERM) 5 % Place 1 patch onto the skin daily. Remove & Discard patch within 12 hours or as directed by MD Patient not taking: Reported on 10/05/2017 10/29/15   Rolly SalterPatel, Pranav M, MD  lisinopril (PRINIVIL,ZESTRIL) 20 MG tablet take 1 tablet by mouth once daily 02/14/17   Rodrigo RanPerini, Mark, MD  loratadine (CLARITIN) 10 MG tablet Take 10 mg by mouth  daily as needed for allergies.     [provider]  Multiple Vitamins-Minerals (MULTIVITAMIN WITH MINERALS) tablet Take 1 tablet by mouth daily.    [provider]  Omega-3 Fatty Acids (FISH OIL) 1000 MG CAPS Take 1 capsule by mouth 2 (two) times daily.    [provider]  oxybutynin (DITROPAN-XL) 10  MG 24 hr tablet Take 1 tablet (10 mg total) by mouth daily. Patient not taking: Reported on 10/25/2015 05/12/13   [place] Peaches, Donetta Potts, MD  oxyCODONE (OXY IR/ROXICODONE) 5 MG immediate release tablet Take 1 tablet (5 mg total) by mouth every 6 (six) hours as needed for severe pain. Patient not taking: Reported on 11/23/2015 10/29/15   Rolly Salter, MD  sertraline (ZOLOFT) 100 MG tablet Take 1 tablet (100 mg total) by mouth daily. Patient not taking: Reported on 10/05/2017 12/06/15   Araceli Bouche, DO  solifenacin (VESICARE) 10 MG tablet Take 10 mg by mouth daily. Reported on 12/21/2015    [provider]  tiZANidine (ZANAFLEX) 4 MG tablet Take 1 tablet (4 mg total) by mouth every 8 (eight) hours as needed for muscle spasms. Patient not taking: Reported on 11/23/2015 04/16/15   Tia Alert, MD  vitamin B-12 (CYANOCOBALAMIN) 100 MCG tablet Take 100 mcg by mouth daily.    [provider]   Physical Exam: Blood pressure (!) 158/80, pulse 81, temperature 98 F (36.7 C), temperature source Oral, resp. rate 18, height 5\' 4"  (1.626 m), weight 68 kg, SpO2 95 %. 1. General:  in No  Acute distress   well -appearing 2. Psychological: Alert and  Oriented 3. Head/ENT:    Dry Mucous Membranes                          Head Non traumatic, neck supple                          Normal  Dentition 4. SKIN: normal  Skin turgor,  Skin clean Dry and intact no rash 5. Heart: Regular rate and rhythm no Murmur, no Rub or gallop 6. Lungs:  Clear to auscultation bilaterally, no wheezes or crackles   7. Abdomen: Soft,  non-tender, Non distended   obese  bowel sounds present 8. Lower extremities: no clubbing, cyanosis, trace edema 9. Neurologically Grossly intact, moving all 4 extremities equally   10. MSK: Normal range of motion   LABS:     Recent Labs  Lab 12/14/18 2222  WBC 6.8  NEUTROABS 4.5  HGB 14.4  HCT 42.2  MCV 101.7*  PLT 228   Basic Metabolic Panel: Recent Labs  Lab  12/14/18 2222  NA 138  K 3.5  CL 102  CO2 24  GLUCOSE 106*  BUN 14  CREATININE 0.60  CALCIUM 9.2      No results for input(s): AST, ALT, ALKPHOS, BILITOT, PROT, ALBUMIN in the last 168 hours. No results for input(s): LIPASE, AMYLASE in the last 168 hours. No results for input(s): AMMONIA in the last 168 hours.    HbA1C: No results for input(s): HGBA1C in the last 72 hours. CBG: No results for input(s): GLUCAP in the last 168 hours.    Urine analysis:    Component Value Date/Time   COLORURINE YELLOW 03/18/2015 1810   APPEARANCEUR CLEAR 03/18/2015 1810   LABSPEC 1.005 03/18/2015 1810   PHURINE 5.5 03/18/2015 1810   GLUCOSEU NEGATIVE 03/18/2015 1810  HGBUR SMALL (A) 03/18/2015 1810   HGBUR negative 11/23/2010 1008   BILIRUBINUR NEGATIVE 03/18/2015 1810   BILIRUBINUR negative 04/04/2013 1428   KETONESUR NEGATIVE 03/18/2015 1810   PROTEINUR NEGATIVE 03/18/2015 1810   UROBILINOGEN 0.2 03/18/2015 1810   NITRITE NEGATIVE 03/18/2015 1810   LEUKOCYTESUR NEGATIVE 03/18/2015 1810      Cultures:    Component Value Date/Time   SDES FLUID RIGHT PLEURAL 10/27/2015 0932   SDES FLUID RIGHT PLEURAL 10/27/2015 0932   SPECREQUEST BOTTLES DRAWN AEROBIC AND ANAEROBIC 10/27/2015 0932   SPECREQUEST NONE 10/27/2015 0932   CULT NO GROWTH 5 DAYS 10/27/2015 0932   REPTSTATUS 11/01/2015 FINAL 10/27/2015 0932   REPTSTATUS 10/27/2015 FINAL 10/27/2015 0932     Radiological Exams on Admission: Dg Chest 2 View  Result Date: 12/14/2018 CLINICAL DATA:  Palpitations for 3 days, becoming more frequent EXAM: CHEST - 2 VIEW COMPARISON:  10/29/2015 FINDINGS: Chronic eventration of RIGHT diaphragm. Normal heart size, mediastinal contours, and pulmonary vascularity. Atherosclerotic calcification aorta. Subsegmental atelectasis at minor fissure and at LEFT base. Lungs otherwise clear. No acute infiltrate, pleural effusion, or pneumothorax. Bones demineralized with dextroconvex thoracic scoliosis.  IMPRESSION: Subsegmental atelectasis at minor fissure and LEFT base. No acute infiltrate. Electronically Signed   By: Ulyses Southward M.D.   On: 12/14/2018 22:59    Chart has been reviewed    Assessment/Plan  78 y.o. female with medical history significant of interstitial cystitis, open angle glaucoma, bladder cancer and anxiety, GERD, HLD, HTN Admitted for  polpitations and chest pain   Present on Admission: . Chest pain - - H=  1  , E=0  ,A= 2  , R   2   , T 0 ,  for the  Total of 5  therefore will admit for observation and further evaluation ( Risk of MACE: Scores 0-3  of 0.9-1.7%.,  4-6: 12-16.6% , Scores ?7: 50-65% )   -  Other explanation for chest pain could be  psychosomatic   - monitor on telemetry, cycle cardiac enzymes, obtain serial ECG and  ECHO in AM.   - Daily aspirin -  Further risk stratify with lipid panel, hgA1C, obtain TSH.  Make sure patient is on Aspirin.  We will notify cardiology regarding patient's admission. Further management depends on pending  Workup Given patient recently has been sedentary with slight of the patient and d-dimer and left lower extremity swelling with complaints of palpitation shortness of breath obtain CTA to rule out PE  . HYPERCHOLESTEROLEMIA chronic stable continue home medications check lipid panel . Essential hypertension chronic stable continue home medications . Palpitations -monitor on telemetry noted elevated TSH we will check T4 T3 counts  Elevated TSH we will check T4 T3 -will likely need initiation of treatment   Other plan as per orders.  DVT prophylaxis:  Lovenox     Code Status:  FULL CODE   Family Communication:   Family not at  Bedside    Disposition Plan:     To home once workup is complete and patient is stable                    Consults called: please notify Cardiology in Am if work up is abnormal   Admission status:  Obs    Level of care    tele  For   24H          Melissa Gonzales 12/15/2018, 1:38 AM     [facility]  after 2 AM please page floor coverage PA If 7AM-7PM, please contact the day team taking care of the patient using [email or web address]

## 2018-12-15 NOTE — Progress Notes (Signed)
Pt requested Netarsudil-Latanoprost 0.02-0.005 or just bland Latanoprost for tonight. RN paged MD, did not get a respone, will pass it on to night shift to order it for patient. Pt will try to bring it in tomorrow am for pharmacy to look at it.

## 2018-12-15 NOTE — Progress Notes (Signed)
VASCULAR LAB PRELIMINARY  PRELIMINARY  PRELIMINARY  PRELIMINARY  Bilateral lower extremity venous duplex completed.    Preliminary report:  See CV Proc Sherren Kerns, RVT 12/15/2018, 3:29 PM

## 2018-12-16 ENCOUNTER — Observation Stay (HOSPITAL_COMMUNITY): Payer: Medicare Other

## 2018-12-16 ENCOUNTER — Encounter (HOSPITAL_COMMUNITY): Payer: Self-pay | Admitting: Physician Assistant

## 2018-12-16 DIAGNOSIS — R002 Palpitations: Secondary | ICD-10-CM | POA: Diagnosis not present

## 2018-12-16 DIAGNOSIS — R079 Chest pain, unspecified: Secondary | ICD-10-CM

## 2018-12-16 DIAGNOSIS — I10 Essential (primary) hypertension: Secondary | ICD-10-CM

## 2018-12-16 DIAGNOSIS — E7800 Pure hypercholesterolemia, unspecified: Secondary | ICD-10-CM | POA: Diagnosis not present

## 2018-12-16 DIAGNOSIS — I2510 Atherosclerotic heart disease of native coronary artery without angina pectoris: Secondary | ICD-10-CM

## 2018-12-16 LAB — T3: T3, Total: 130 ng/dL (ref 71–180)

## 2018-12-16 MED ORDER — IOPAMIDOL (ISOVUE-370) INJECTION 76%
80.0000 mL | Freq: Once | INTRAVENOUS | Status: AC | PRN
  Administered 2018-12-16: 80 mL via INTRAVENOUS

## 2018-12-16 MED ORDER — VITAMIN B-1 100 MG PO TABS
100.0000 mg | ORAL_TABLET | Freq: Every day | ORAL | Status: DC
  Administered 2018-12-16 – 2018-12-18 (×3): 100 mg via ORAL
  Filled 2018-12-16 (×3): qty 1

## 2018-12-16 MED ORDER — NITROGLYCERIN 0.4 MG SL SUBL
0.8000 mg | SUBLINGUAL_TABLET | Freq: Once | SUBLINGUAL | Status: AC
  Administered 2018-12-16: 0.8 mg via SUBLINGUAL

## 2018-12-16 MED ORDER — NITROGLYCERIN 0.4 MG SL SUBL
SUBLINGUAL_TABLET | SUBLINGUAL | Status: AC
  Filled 2018-12-16: qty 2

## 2018-12-16 MED ORDER — SODIUM CHLORIDE 0.9 % WEIGHT BASED INFUSION: 1.0000 mL/kg/h | INTRAVENOUS | Status: DC

## 2018-12-16 MED ORDER — EZETIMIBE 10 MG PO TABS
10.0000 mg | ORAL_TABLET | Freq: Every day | ORAL | Status: DC
  Administered 2018-12-16 – 2018-12-18 (×3): 10 mg via ORAL
  Filled 2018-12-16 (×3): qty 1

## 2018-12-16 MED ORDER — SODIUM CHLORIDE 0.9 % IV SOLN: 250.0000 mL | INTRAVENOUS | Status: DC | PRN

## 2018-12-16 MED ORDER — ADULT MULTIVITAMIN W/MINERALS CH
1.0000 | ORAL_TABLET | Freq: Every day | ORAL | Status: DC
  Administered 2018-12-16 – 2018-12-18 (×3): 1 via ORAL
  Filled 2018-12-16 (×3): qty 1

## 2018-12-16 MED ORDER — SODIUM CHLORIDE 0.9% FLUSH
3.0000 mL | Freq: Two times a day (BID) | INTRAVENOUS | Status: DC
  Administered 2018-12-16: 3 mL via INTRAVENOUS

## 2018-12-16 MED ORDER — THIAMINE HCL 100 MG/ML IJ SOLN: 100.0000 mg | Freq: Every day | INTRAMUSCULAR | Status: DC

## 2018-12-16 MED ORDER — SODIUM CHLORIDE 0.9 % WEIGHT BASED INFUSION
3.0000 mL/kg/h | INTRAVENOUS | Status: DC
  Administered 2018-12-17: 3 mL/kg/h via INTRAVENOUS

## 2018-12-16 MED ORDER — METOPROLOL TARTRATE 100 MG PO TABS
100.0000 mg | ORAL_TABLET | Freq: Once | ORAL | Status: AC
  Administered 2018-12-16: 100 mg via ORAL
  Filled 2018-12-16: qty 1

## 2018-12-16 MED ORDER — ASPIRIN 81 MG PO CHEW
81.0000 mg | CHEWABLE_TABLET | ORAL | Status: AC
  Administered 2018-12-17: 81 mg via ORAL

## 2018-12-16 MED ORDER — SODIUM CHLORIDE 0.9% FLUSH: 3.0000 mL | INTRAVENOUS | Status: DC | PRN

## 2018-12-16 MED ORDER — CARVEDILOL 3.125 MG PO TABS
3.1250 mg | ORAL_TABLET | Freq: Two times a day (BID) | ORAL | Status: DC
  Administered 2018-12-16 – 2018-12-17 (×2): 3.125 mg via ORAL
  Filled 2018-12-16 (×2): qty 1

## 2018-12-16 MED ORDER — FOLIC ACID 1 MG PO TABS
1.0000 mg | ORAL_TABLET | Freq: Every day | ORAL | Status: DC
  Administered 2018-12-16: 1 mg via ORAL
  Filled 2018-12-16: qty 1
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
## 2018-12-16 NOTE — Progress Notes (Signed)
Pt now  agreeing to heart cath procedure tomorrow after speaking to her primary MD. Lizabeth Leyden, NP text paged and made aware. Primary RN, Cammy Copa, witnessed consent for procedure tomorrow.

## 2018-12-16 NOTE — H&P (View-Only) (Signed)
Cardiac CT scan result discussed with Dr. Duke Salvia. Unfortunately the degree of coronary stenosis is not interpretable due to mistiming of the contrast bolus but the study does demonstrate 3V CAD and extremely high calcium score putting her at increased risk for CV events. The CT unfortunately was unable to answer the question of how much this could potentially be blocking the blood flow. Dr. Duke Salvia recommends cardiac cath. Risks and benefits of cardiac catheterization have been discussed with the patient.  These include bleeding, infection, kidney damage, stroke, heart attack, death.  The patient acknowledges these risks, and we discussed her CT scan in depth. She is currently very overwhelmed with this news and wants to process it. She would like to reach out to her primary care provider to discuss before committing to the cath. Tentatively I have put her on for 3rd case tomorrow and placed pre-cath orders. I answered all questions to the best of my ability and asked her to please let her nurse know when she's made a decision. Darrow Barreiro PA-C

## 2018-12-16 NOTE — Progress Notes (Signed)
Cardiac CT scan result discussed with Dr. [place]. Unfortunately the degree of coronary stenosis is not interpretable due to mistiming of the contrast bolus but the study does demonstrate 3V CAD and extremely high calcium score putting her at increased risk for CV events. The CT unfortunately was unable to answer the question of how much this could potentially be blocking the blood flow. Dr. Magee recommends cardiac cath. Risks and benefits of cardiac catheterization have been discussed with the patient.  These include bleeding, infection, kidney damage, stroke, heart attack, death.  The patient acknowledges these risks, and we discussed her CT scan in depth. She is currently very overwhelmed with this news and wants to process it. She would like to reach out to her primary care provider to discuss before committing to the cath. Tentatively I have put her on for 3rd case tomorrow and placed pre-cath orders. I answered all questions to the best of my ability and asked her to please let her nurse know when she's made a decision. Dayna Dunn PA-C   

## 2018-12-16 NOTE — Consult Note (Signed)
Cardiology Consultation:   Patient ID: [name]; [id number]; 1941-01-12   Admit date: 12/14/2018 Date of Consult: 12/16/2018  Primary Care Provider: Rodrigo Ran, MD Primary Cardiologist: Chilton Si, MD (new) Primary Electrophysiologist:  None  Chief Complaint: palpitations, dyspnea  Patient Profile:   Melissa Gonzales is a 78 y.o. female with a hx of bladder CA, habitual wine intake, interstitial cystitis, glaucoma, anxiety, GERD, HTN, HLD, arthritis who is being seen today for the evaluation of palpitations, dyspnea, chest discomfort at the request of Dr. Katrinka Blazing (chest pain unit consult).  History of Present Illness:   Melissa Gonzales has no prior cardiac history or workup. She walks with a walker due to history of frequent falls which she relates to her peripheral neuropathy. She does exercise periodically on a treadmill at her apartment complex - she states this activity typically actually makes her feel better. She last did so on Wednesday 2/5 without any functional limitation or anginal. However, over the past weeks' time she has noticed intermittent SOB but at rest and with exertion. She is somewhat of a vague historian, frequently referencing that these symptoms are foreign to her therefore she has a hard time describing how they feel or how long they last. She also reported sensation of chest discomfort over the last week, but states this is more of palpitations instead, like heart racing. She wears an Apple watch and reports HR had occasionally gotten as high as 129 but had generally run in the 90s-100 range even while experiencing the palpitations. They can happen once a day or a few times a day. She reports some dull chest discomfort but has a hard time telling me how often this happened to her. There was no precipitating factors. The palpitations and dyspnea have been increasing in frequency and severity, prompting her to come to the ER. Initial EKG showed NSR with occasional  PVCs, but otherwise telemetry has been benign. TSH 5.337, free T4 and T3 wnl. BMET and CBC OK except mild hyperglycemia and macrocytosis. Troponins neg x 4, LDL 97, d-dimer 1.51, A1C 5.8. CXR shows subsegmental atelectasis minor fissure and L base, no acute infiltrate. CT angio showed no PE, + mild scarring versus atelectasis in the mid to lower lungs bilaterally, + coronary atherosclerosis. LE venous duplex negative. 2D echo yesterday showed EF >65%, mild increase in LV wall thickness, impaired diastolic relaxation, normal RV, mild TR, moderate AI. She has been asymptomatic today but is tearful stating she does not have confidence in the team that admitted her as she feels there is something quite wrong. She also reports profound fatigue requiring her to nap during the day this past week, which is unusual. She denies any weight changes, bleeding, diaphoresis or syncope.  Past Medical History:  Diagnosis Date  . Anxiety   . Arthritis   . Depression   . Diverticulosis   . GERD (gastroesophageal reflux disease)   . Glaucoma    Bilateral eyes  . Hyperlipidemia   . Hypertension   . Pneumonia    hx of  . Urgency of urination     Past Surgical History:  Procedure Laterality Date  . BACK SURGERY    . CHOLECYSTECTOMY  1993  . COLONOSCOPY    . EYE SURGERY     Cataract Surgery Bilateral, 5 years ago  . MAXIMUM ACCESS (MAS)POSTERIOR LUMBAR INTERBODY FUSION (PLIF) 1 LEVEL N/A 03/17/2015   Procedure: Lumbar five -sacral one Maximum access posterior lumbar interbody fusion/Lumbar two-three  Laminectomy and extension  of instrumentation to [place];  Surgeon: Tia Alert, MD;  Location: [facility] NEURO ORS;  Service: Neurosurgery;  Laterality: N/A;  . MAXIMUM ACCESS (MAS)POSTERIOR LUMBAR INTERBODY FUSION (PLIF) 2 LEVEL N/A 11/27/2014   Procedure: LUMBAR THREE-FOUR, LUMBAR FOUR-FIVE MAXIMUM ACCESS POSTERIOR LUMBAR INTERBODY FUSION;  Surgeon: Tia Alert, MD;  Location: [facility] NEURO ORS;  Service: Neurosurgery;   Laterality: N/A;  L3-4 L4-5 MAXIMUM ACCESS POSTERIOR LUMBAR INTERBODY FUSION  . VAGINAL HYSTERECTOMY  1970s   uterine prolapse     Inpatient Medications: Scheduled Meds: . aspirin EC  81 mg Oral Daily  . atorvastatin  80 mg Oral Daily  . brimonidine  1 drop Both Eyes Q12H  . dorzolamide-timolol  1 drop Both Eyes BID  . enoxaparin (LOVENOX) injection  40 mg Subcutaneous Q24H  . folic acid  1 mg Oral Daily  . latanoprost  1 drop Both Eyes QHS  . levothyroxine  25 mcg Oral Q0600  . multivitamin with minerals  1 tablet Oral Daily  . nitrofurantoin  100 mg Oral Daily  . pantoprazole  40 mg Oral Daily  . pentosan polysulfate  100 mg Oral TID  . thiamine  100 mg Oral Daily   Or  . thiamine  100 mg Intravenous Daily   Continuous Infusions:  PRN Meds: acetaminophen **OR** acetaminophen, ALPRAZolam, HYDROcodone-acetaminophen, ondansetron **OR** ondansetron (ZOFRAN) IV  Home Meds: Prior to Admission medications   Medication Sig Start Date End Date Taking? Authorizing Provider  ALPHAGAN P 0.1 % SOLN Place 1 drop into both eyes every 12 (twelve) hours.  04/05/12  Yes [provider]  ALPRAZolam (XANAX) 0.25 MG tablet TAKE 1 TABLET BY MOUTH TWICE DAILY AS NEEDED FOR ANXIETY Patient taking differently: Take 0.25 mg by mouth 2 (two) times daily as needed for anxiety.  05/05/15  Yes Rumley, [place] N, DO  atorvastatin (LIPITOR) 80 MG tablet Take 80 mg by mouth daily.   Yes [provider]  Calcium Carb-Cholecalciferol (CALCIUM 600 + D PO) Take 1 tablet by mouth daily.    Yes [provider]  dorzolamide-timolol (COSOPT) 22.3-6.8 MG/ML ophthalmic solution Place 1 drop into both eyes 2 (two) times daily.  03/20/11  Yes [provider]  ELMIRON 100 MG capsule Take 100 mg by mouth 3 (three) times daily. 11/19/18  Yes [provider]  esomeprazole (NEXIUM) 40 MG capsule Take 1 capsule (40 mg total) by mouth daily before breakfast. 05/22/14  Yes Rumley, [place]  N, DO  lisinopril (PRINIVIL,ZESTRIL) 20 MG tablet take 1 tablet by mouth once daily 02/14/17  Yes Perini, Loraine Leriche, MD  Meth-Hyo-M Bl-Na Phos-Ph Sal (URIBEL) 118 MG CAPS Take 1 capsule by mouth 2 (two) times daily as needed (for pain).  07/02/18  Yes [provider]  Multiple Vitamins-Minerals (MULTIVITAMIN WITH MINERALS) tablet Take 1 tablet by mouth daily.   Yes [provider]  Netarsudil-Latanoprost 0.02-0.005 % SOLN Place 1 drop into both eyes at bedtime. 11/18/18  Yes [provider]  nitrofurantoin (MACRODANTIN) 100 MG capsule Take 100 mg by mouth daily. 12/08/18  Yes [provider]  Omega-3 Fatty Acids (FISH OIL) 1000 MG CAPS Take 1 capsule by mouth 2 (two) times daily.   Yes [provider]  vitamin B-12 (CYANOCOBALAMIN) 100 MCG tablet Take 100 mcg by mouth daily.   Yes [provider]  atorvastatin (LIPITOR) 40 MG tablet take 1 tablet by mouth once daily Patient not taking: Reported on 12/15/2018 06/26/14   Araceli Bouche, DO  azithromycin (ZITHROMAX) 500  MG tablet Take 1 tablet (500 mg total) by mouth daily. Patient not taking: Reported on 12/21/2015 10/29/15   Rolly Salter, MD  benzonatate (TESSALON) 200 MG capsule Take 1 capsule (200 mg total) by mouth 3 (three) times daily as needed for cough. Patient not taking: Reported on 12/21/2015 10/29/15   Rolly Salter, MD  cefpodoxime (VANTIN) 200 MG tablet Take 1 tablet (200 mg total) by mouth every 12 (twelve) hours. Patient not taking: Reported on 12/21/2015 10/29/15   Rolly Salter, MD  diclofenac sodium (VOLTAREN) 1 % GEL Apply 2 g topically 4 (four) times daily. Patient not taking: Reported on 12/15/2018 12/04/17   Lenda Kelp, MD  hydrochlorothiazide (MICROZIDE) 12.5 MG capsule Take 1 capsule (12.5 mg total) by mouth daily. Patient not taking: Reported on 12/21/2015 05/05/15   Araceli Bouche, DO  lidocaine (LIDODERM) 5 % Place 1 patch onto the skin daily. Remove & Discard patch  within 12 hours or as directed by MD Patient not taking: Reported on 10/05/2017 10/29/15   Rolly Salter, MD  oxybutynin (DITROPAN-XL) 10 MG 24 hr tablet Take 1 tablet (10 mg total) by mouth daily. Patient not taking: Reported on 10/25/2015 05/12/13   [place] Peaches, Donetta Potts, MD  oxyCODONE (OXY IR/ROXICODONE) 5 MG immediate release tablet Take 1 tablet (5 mg total) by mouth every 6 (six) hours as needed for severe pain. Patient not taking: Reported on 11/23/2015 10/29/15   Rolly Salter, MD  sertraline (ZOLOFT) 100 MG tablet Take 1 tablet (100 mg total) by mouth daily. Patient not taking: Reported on 10/05/2017 12/06/15   Araceli Bouche, DO  tiZANidine (ZANAFLEX) 4 MG tablet Take 1 tablet (4 mg total) by mouth every 8 (eight) hours as needed for muscle spasms. Patient not taking: Reported on 11/23/2015 04/16/15   Tia Alert, MD    Allergies:   No Known Allergies  Social History:   Social History   Socioeconomic History  . Marital status: Widowed    Spouse name: Not on file  . Number of children: 3  . Years of education: Not on file  . Highest education level: Not on file  Occupational History    Employer: RETIRED  Social Needs  . Financial resource strain: Not on file  . Food insecurity:    Worry: Not on file    Inability: Not on file  . Transportation needs:    Medical: Not on file    Non-medical: Not on file  Tobacco Use  . Smoking status: Never Smoker  . Smokeless tobacco: Never Used  Substance and Sexual Activity  . Alcohol use: Yes    Alcohol/week: 1.0 standard drinks    Types: 1 Glasses of wine per week    Comment: 1-2 glass per night, sometimes none  . Drug use: No  . Sexual activity: Never  Lifestyle  . Physical activity:    Days per week: Not on file    Minutes per session: Not on file  . Stress: Not on file  Relationships  . Social connections:    Talks on phone: Not on file    Gets together: Not on file    Attends religious service: Not on file      Active member of club or organization: Not on file    Attends meetings of clubs or organizations: Not on file    Relationship status: Not on file  . Intimate partner violence:    Fear of current or ex  partner: Not on file    Emotionally abused: Not on file    Physically abused: Not on file    Forced sexual activity: Not on file  Other Topics Concern  . Not on file  Social History Narrative   Husband died suicide age 76s.  Currently in long term romantic relationship.  Support from 2 daughters and good relationship with grandchildren.  Other daughter has mental health issues, strained relationship.  Pt feels she is not making good decisions re: children and has tried to get CPS involved without relief.   1/3/13Lucila Maine (of daughter with mental health issues) facing jail time for selling drugs.  Pt has tried to support him and offered help with rehab or with changing colleges, but he refuses.      Family History:   The patient's family history includes Cancer in her father; Drug abuse in her grandchild; Heart disease (age of onset: 70) in her mother; Hyperlipidemia in her sister; Hypertension in her sister; Mental illness in her daughter. There is no history of Diabetes.  ROS:  Please see the history of present illness.  + reports chronic LLE swelling which is managed with exercise and compression hose. All other ROS reviewed and negative.     Physical Exam/Data:   Vitals:   12/15/18 1430 12/15/18 1552 12/15/18 2106 12/16/18 0637  BP:  (!) 144/72 (!) 160/80 (!) 157/68  Pulse: 85 94 78 76  Resp: 17 15 14 14   Temp:  97.9 F (36.6 C) 97.8 F (36.6 C) 97.9 F (36.6 C)  TempSrc:  Oral Oral Oral  SpO2: 97% 97% 100% 95%  Weight:  69 kg  69 kg  Height:        Intake/Output Summary (Last 24 hours) at 12/16/2018 0919 Last data filed at 12/16/2018 0600 Gross per 24 hour  Intake 960 ml  Output -  Net 960 ml   Last 3 Weights 12/16/2018 12/15/2018 12/14/2018  Weight (lbs) 152 lb 1.6 oz  152 lb 1.6 oz 150 lb  Weight (kg) 68.992 kg 68.992 kg 68.04 kg    Body mass index is 26.11 kg/m.  General: Well developed, well nourished WF, in no acute distress. Head: Normocephalic, atraumatic, sclera non-icteric, no xanthomas, nares are without discharge.  Neck: Negative for carotid bruits. JVD not elevated. Lungs: Clear bilaterally to auscultation without wheezes, rales, or rhonchi. Breathing is unlabored. Heart: RRR with S1 S2. Soft SEM at RUSB. No rubs or gallops appreciated. Abdomen: Soft, non-tender, non-distended with normoactive bowel sounds. No hepatomegaly. No rebound/guarding. No obvious abdominal masses. Msk:  Strength and tone appear normal for age. Extremities: No clubbing or cyanosis. No edema.  Distal pedal pulses are 2+ and equal bilaterally. Neuro: Alert and oriented X 3. No facial asymmetry. No focal deficit. Moves all extremities spontaneously. Psych:  Responds to questions appropriately with a tearful.  EKG:  The EKG was personally reviewed and demonstrates NSR 74bpm occ PVCs otherwise no significant acute ST-T changes  Laboratory Data:  Chemistry Recent Labs  Lab 12/14/18 2222 12/15/18 0259  NA 138 138  K 3.5 3.5  CL 102 102  CO2 24 25  GLUCOSE 106* 108*  BUN 14 12  CREATININE 0.60 0.61  CALCIUM 9.2 9.4  GFRNONAA >60 >60  GFRAA >60 >60  ANIONGAP 12 11    Recent Labs  Lab 12/15/18 0259  PROT 6.6  ALBUMIN 3.7  AST 41  ALT 40  ALKPHOS 62  BILITOT 0.9   Hematology Recent Labs  Lab 12/14/18  2222 12/15/18 0259  WBC 6.8 8.1  RBC 4.15 4.16  HGB 14.4 14.0  HCT 42.2 42.4  MCV 101.7* 101.9*  MCH 34.7* 33.7  MCHC 34.1 33.0  RDW 12.5 12.3  PLT 228 223   Cardiac Enzymes Recent Labs  Lab 12/15/18 0018 12/15/18 0535 12/15/18 1247  TROPONINI <0.03 <0.03 <0.03    Recent Labs  Lab 12/14/18 2237  TROPIPOC 0.00    BNPNo results for input(s): BNP, PROBNP in the last 168 hours.  DDimer  Recent Labs  Lab 12/15/18 0018  DDIMER 1.51*     Radiology/Studies:  Dg Chest 2 View  Result Date: 12/14/2018 CLINICAL DATA:  Palpitations for 3 days, becoming more frequent EXAM: CHEST - 2 VIEW COMPARISON:  10/29/2015 FINDINGS: Chronic eventration of RIGHT diaphragm. Normal heart size, mediastinal contours, and pulmonary vascularity. Atherosclerotic calcification aorta. Subsegmental atelectasis at minor fissure and at LEFT base. Lungs otherwise clear. No acute infiltrate, pleural effusion, or pneumothorax. Bones demineralized with dextroconvex thoracic scoliosis. IMPRESSION: Subsegmental atelectasis at minor fissure and LEFT base. No acute infiltrate. Electronically Signed   By: Ulyses SouthwardMark  Boles M.D.   On: 12/14/2018 22:59   Ct Angio Chest Pe W Or Wo Contrast  Result Date: 12/15/2018 CLINICAL DATA:  Palpitations. Dyspnea. Elevated D-dimer. EXAM: CT ANGIOGRAPHY CHEST WITH CONTRAST TECHNIQUE: Multidetector CT imaging of the chest was performed using the standard protocol during bolus administration of intravenous contrast. Multiplanar CT image reconstructions and MIPs were obtained to evaluate the vascular anatomy. CONTRAST:  100mL ISOVUE-370 IOPAMIDOL (ISOVUE-370) INJECTION 76% COMPARISON:  Chest radiograph from one day prior. 11/09/2015 chest CT. FINDINGS: Cardiovascular: The study is high quality for the evaluation of pulmonary embolism. There are no filling defects in the central, lobar, segmental or subsegmental pulmonary artery branches to suggest acute pulmonary embolism. Atherosclerotic nonaneurysmal thoracic aorta. Normal caliber pulmonary arteries. Normal heart size. Left anterior descending coronary atherosclerosis. No pericardial effusion. Mediastinum/Nodes: No discrete thyroid nodules. Unremarkable esophagus. No pathologically enlarged axillary, mediastinal or hilar lymph nodes. Lungs/Pleura: No pneumothorax. No pleural effusion. Scattered parenchymal bands in the mid to lower lungs bilaterally, most prominent in dependent basilar left lower lobe  with associated volume loss, compatible with mild scarring or atelectasis. No lung masses or significant pulmonary nodules. Upper abdomen: Cholecystectomy. Musculoskeletal: No aggressive appearing focal osseous lesions. Marked thoracic spondylosis. Review of the MIP images confirms the above findings. IMPRESSION: 1. No pulmonary embolism. 2. Mild scarring versus atelectasis in the mid to lower lungs bilaterally. 3. Coronary atherosclerosis. Aortic Atherosclerosis (ICD10-I70.0). Electronically Signed   By: Delbert PhenixJason A Poff M.D.   On: 12/15/2018 02:36   Vas [place] Lower Extremity Venous (dvt)  Result Date: 12/15/2018  Lower Venous Study Indications: SOB, and Palpitation, elevated D-Dimer.  Comparison Study: No prior study on file Performing Technologist: Sherren Kernsandace Kanady RVS  Examination Guidelines: A complete evaluation includes B-mode imaging, spectral Doppler, color Doppler, and power Doppler as needed of all accessible portions of each vessel. Bilateral testing is considered an integral part of a complete examination. Limited examinations for reoccurring indications may be performed as noted.  Right Venous Findings: +---------+---------------+---------+-----------+----------+-------+          CompressibilityPhasicitySpontaneityPropertiesSummary +---------+---------------+---------+-----------+----------+-------+ CFV      Full           Yes      Yes                          +---------+---------------+---------+-----------+----------+-------+ SFJ      Full                                                 +---------+---------------+---------+-----------+----------+-------+  FV Prox  Full                                                 +---------+---------------+---------+-----------+----------+-------+ FV Mid   Full                                                 +---------+---------------+---------+-----------+----------+-------+ FV DistalFull                                                  +---------+---------------+---------+-----------+----------+-------+ PFV      Full                                                 +---------+---------------+---------+-----------+----------+-------+ POP      Full           Yes      Yes                          +---------+---------------+---------+-----------+----------+-------+ PTV      Full                                                 +---------+---------------+---------+-----------+----------+-------+ PERO     Full                                                 +---------+---------------+---------+-----------+----------+-------+  Left Venous Findings: +---------+---------------+---------+-----------+----------+-------+          CompressibilityPhasicitySpontaneityPropertiesSummary +---------+---------------+---------+-----------+----------+-------+ CFV      Full           Yes      Yes                          +---------+---------------+---------+-----------+----------+-------+ SFJ      Full                                                 +---------+---------------+---------+-----------+----------+-------+ FV Prox  Full                                                 +---------+---------------+---------+-----------+----------+-------+ FV Mid   Full                                                 +---------+---------------+---------+-----------+----------+-------+  FV DistalFull                                                 +---------+---------------+---------+-----------+----------+-------+ PFV      Full                                                 +---------+---------------+---------+-----------+----------+-------+ POP      Full           Yes      Yes                          +---------+---------------+---------+-----------+----------+-------+ PTV      Full                                                  +---------+---------------+---------+-----------+----------+-------+ PERO     Full                                                 +---------+---------------+---------+-----------+----------+-------+    Summary: Right: There is no evidence of deep vein thrombosis in the lower extremity. Left: There is no evidence of deep vein thrombosis in the lower extremity. A cystic structure is found in the popliteal fossa.  *See table(s) above for measurements and observations.    Preliminary     Assessment and Plan:   1. Dyspnea, palpitations, fatigue and chest discomfort - history is somewhat limited due to vague descriptors, but the patient is clearly concerned that something is different over this last week. Initial EKG showed NSR with PVCs which does not typically cause sensation of heart "racing;" sounds like Apple watch has been unrevealing at times even while symptomatic. There does not appear to be another acute cause for her symptoms except possibly her thyroid abnormalities, albeit these are very mild. CT angio of the chest does remark there is coronary atherosclerosis present. The patient has eaten today. Per discussion with Dr. Duke Salvia will plan for cardiac CT today. She will read study. Will administer Lopressor 100mg  x 1 now per protocol.  2. Occasional PVCs - noted on admit EKG, but not seen further on telemetry. We can consider OP event monitoring.  3. Essential HTN - home lisinopril remains on hold. Can consider subbing out for carvedilol instead given occasional PVCs. Await results of cardiac w/u.  4. Hyperlipidemia - this is followed by PCP. Consideration could be given to addition of Zetia to achieve goal LDL of <70, as she is on high dose atorvastatin and LDL is 97.  For questions or updates, please contact [facility] Please consult [email or web address] for contact info under Cardiology/STEMI.    Signed, Laurann Montana, PA-C  12/16/2018 9:19 AM

## 2018-12-16 NOTE — Progress Notes (Addendum)
Patient was admitted after midnight see full H&P for complete details.  She reports 1 week of having intermittent palpitations that had become more frequently.  She reports drinking 1 to 2 glasses of wine a couple days out of the week.  Troponins have been negative.  Doppler ultrasound of lower extremities showed no acute abnormalities.  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  Will reduce to 25 mcg of levothyroxine daily.  Monitor overnight and likely discharge home in a.m. May  warrant Holter monitor at discharge.

## 2018-12-17 ENCOUNTER — Inpatient Hospital Stay (HOSPITAL_COMMUNITY): Payer: Medicare Other

## 2018-12-17 ENCOUNTER — Encounter (HOSPITAL_COMMUNITY)
Admission: EM | Disposition: A | Discharge status: Skilled Nursing Facility | Payer: Self-pay | Source: Home / Self Care | Attending: Cardiothoracic Surgery

## 2018-12-17 ENCOUNTER — Encounter (HOSPITAL_COMMUNITY): Payer: Self-pay | Admitting: Cardiovascular Disease

## 2018-12-17 ENCOUNTER — Other Ambulatory Visit: Payer: Self-pay | Admitting: *Deleted

## 2018-12-17 DIAGNOSIS — I2510 Atherosclerotic heart disease of native coronary artery without angina pectoris: Secondary | ICD-10-CM

## 2018-12-17 DIAGNOSIS — E7800 Pure hypercholesterolemia, unspecified: Secondary | ICD-10-CM | POA: Diagnosis present

## 2018-12-17 DIAGNOSIS — N3010 Interstitial cystitis (chronic) without hematuria: Secondary | ICD-10-CM | POA: Diagnosis present

## 2018-12-17 DIAGNOSIS — E8770 Fluid overload, unspecified: Secondary | ICD-10-CM | POA: Diagnosis not present

## 2018-12-17 DIAGNOSIS — R079 Chest pain, unspecified: Secondary | ICD-10-CM | POA: Diagnosis present

## 2018-12-17 DIAGNOSIS — E785 Hyperlipidemia, unspecified: Secondary | ICD-10-CM | POA: Diagnosis present

## 2018-12-17 DIAGNOSIS — I25119 Atherosclerotic heart disease of native coronary artery with unspecified angina pectoris: Secondary | ICD-10-CM

## 2018-12-17 DIAGNOSIS — I209 Angina pectoris, unspecified: Secondary | ICD-10-CM

## 2018-12-17 DIAGNOSIS — Z9181 History of falling: Secondary | ICD-10-CM | POA: Diagnosis not present

## 2018-12-17 DIAGNOSIS — M7122 Synovial cyst of popliteal space [Baker], left knee: Secondary | ICD-10-CM | POA: Diagnosis present

## 2018-12-17 DIAGNOSIS — I25110 Atherosclerotic heart disease of native coronary artery with unstable angina pectoris: Principal | ICD-10-CM

## 2018-12-17 DIAGNOSIS — Z8551 Personal history of malignant neoplasm of bladder: Secondary | ICD-10-CM | POA: Diagnosis not present

## 2018-12-17 DIAGNOSIS — Z8349 Family history of other endocrine, nutritional and metabolic diseases: Secondary | ICD-10-CM | POA: Diagnosis not present

## 2018-12-17 DIAGNOSIS — H4010X0 Unspecified open-angle glaucoma, stage unspecified: Secondary | ICD-10-CM | POA: Diagnosis present

## 2018-12-17 DIAGNOSIS — Z8249 Family history of ischemic heart disease and other diseases of the circulatory system: Secondary | ICD-10-CM | POA: Diagnosis not present

## 2018-12-17 DIAGNOSIS — R7303 Prediabetes: Secondary | ICD-10-CM | POA: Diagnosis present

## 2018-12-17 DIAGNOSIS — I493 Ventricular premature depolarization: Secondary | ICD-10-CM | POA: Diagnosis present

## 2018-12-17 DIAGNOSIS — J90 Pleural effusion, not elsewhere classified: Secondary | ICD-10-CM | POA: Diagnosis not present

## 2018-12-17 DIAGNOSIS — Z01810 Encounter for preprocedural cardiovascular examination: Secondary | ICD-10-CM | POA: Diagnosis not present

## 2018-12-17 DIAGNOSIS — D62 Acute posthemorrhagic anemia: Secondary | ICD-10-CM | POA: Diagnosis not present

## 2018-12-17 DIAGNOSIS — Z7722 Contact with and (suspected) exposure to environmental tobacco smoke (acute) (chronic): Secondary | ICD-10-CM | POA: Diagnosis present

## 2018-12-17 DIAGNOSIS — F419 Anxiety disorder, unspecified: Secondary | ICD-10-CM | POA: Diagnosis present

## 2018-12-17 DIAGNOSIS — I10 Essential (primary) hypertension: Secondary | ICD-10-CM | POA: Diagnosis present

## 2018-12-17 DIAGNOSIS — K219 Gastro-esophageal reflux disease without esophagitis: Secondary | ICD-10-CM | POA: Diagnosis present

## 2018-12-17 DIAGNOSIS — I2584 Coronary atherosclerosis due to calcified coronary lesion: Secondary | ICD-10-CM

## 2018-12-17 DIAGNOSIS — E039 Hypothyroidism, unspecified: Secondary | ICD-10-CM | POA: Diagnosis present

## 2018-12-17 DIAGNOSIS — I959 Hypotension, unspecified: Secondary | ICD-10-CM | POA: Diagnosis not present

## 2018-12-17 DIAGNOSIS — Z981 Arthrodesis status: Secondary | ICD-10-CM | POA: Diagnosis not present

## 2018-12-17 HISTORY — PX: LEFT HEART CATH AND CORONARY ANGIOGRAPHY: CATH118249

## 2018-12-17 LAB — FOLATE RBC
Folate, Hemolysate: >620 ng/mL
Folate, RBC: >1459 ng/mL (ref >498)
Hematocrit: 42.5 % (ref 34.0–46.6)

## 2018-12-17 LAB — CBC
HCT: 40.4 % (ref 36.0–46.0)
Hemoglobin: 13.1 g/dL (ref 12.0–15.0)
MCH: 33.5 pg (ref 26.0–34.0)
MCHC: 32.4 g/dL (ref 30.0–36.0)
MCV: 103.3 fL — AB (ref 80.0–100.0)
PLATELETS: 168 10*3/uL (ref 150–400)
RBC: 3.91 MIL/uL (ref 3.87–5.11)
RDW: 12.3 % (ref 11.5–15.5)
WBC: 5.6 10*3/uL (ref 4.0–10.5)
nRBC: 0 % (ref 0.0–0.2)

## 2018-12-17 LAB — PULMONARY FUNCTION TEST
FEF 25-75 Post: 1.3 L/sec
FEF 25-75 Pre: 1.74 L/sec
FEF2575-%Change-Post: -25 %
FEF2575-%Pred-Post: 85 %
FEF2575-%Pred-Pre: 115 %
FEV1-%Change-Post: -1 %
FEV1-%Pred-Post: 83 %
FEV1-%Pred-Pre: 85 %
FEV1-Post: 1.68 L
FEV1-Pre: 1.71 L
FEV1FVC-%Change-Post: -9 %
FEV1FVC-%Pred-Pre: 107 %
FEV6-%Change-Post: 1 %
FEV6-%Pred-Post: 84 %
FEV6-%Pred-Pre: 83 %
FEV6-Post: 2.16 L
FEV6-Pre: 2.14 L
FEV6FVC-%Change-Post: -6 %
FEV6FVC-%Pred-Post: 98 %
FEV6FVC-%Pred-Pre: 105 %
FVC-%Change-Post: 8 %
FVC-%Pred-Post: 86 %
FVC-%Pred-Pre: 79 %
FVC-Post: 2.32 L
FVC-Pre: 2.14 L
Post FEV1/FVC ratio: 73 %
Post FEV6/FVC ratio: 93 %
Pre FEV1/FVC ratio: 80 %
Pre FEV6/FVC Ratio: 100 %

## 2018-12-17 LAB — CREATININE, SERUM
Creatinine, Ser: 0.59 mg/dL (ref 0.44–1.00)
GFR calc Af Amer: >60 mL/min (ref >60)
GFR calc non Af Amer: >60 mL/min (ref >60)

## 2018-12-17 LAB — HEMATOLOGY COMMENTS:

## 2018-12-17 SURGERY — LEFT HEART CATH AND CORONARY ANGIOGRAPHY
Anesthesia: LOCAL

## 2018-12-17 MED ORDER — ASPIRIN 81 MG PO CHEW
81.0000 mg | CHEWABLE_TABLET | Freq: Every day | ORAL | Status: DC
  Administered 2018-12-18: 81 mg via ORAL
  Filled 2018-12-17: qty 1

## 2018-12-17 MED ORDER — ENOXAPARIN SODIUM 40 MG/0.4ML ~~LOC~~ SOLN
40.0000 mg | SUBCUTANEOUS | Status: DC
  Administered 2018-12-18: 40 mg via SUBCUTANEOUS
  Filled 2018-12-17: qty 0.4

## 2018-12-17 MED ORDER — VERAPAMIL HCL 2.5 MG/ML IV SOLN
INTRAVENOUS | Status: DC | PRN
  Administered 2018-12-17: 10 mL via INTRA_ARTERIAL

## 2018-12-17 MED ORDER — ASPIRIN 81 MG PO CHEW
CHEWABLE_TABLET | ORAL | Status: AC
  Filled 2018-12-17: qty 1

## 2018-12-17 MED ORDER — MIDAZOLAM HCL 2 MG/2ML IJ SOLN
INTRAMUSCULAR | Status: AC
  Filled 2018-12-17: qty 2

## 2018-12-17 MED ORDER — ACETAMINOPHEN 325 MG PO TABS
650.0000 mg | ORAL_TABLET | ORAL | Status: DC | PRN
  Administered 2018-12-17 – 2018-12-18 (×3): 650 mg via ORAL
  Filled 2018-12-17 (×3): qty 2

## 2018-12-17 MED ORDER — FENTANYL CITRATE (PF) 100 MCG/2ML IJ SOLN
INTRAMUSCULAR | Status: DC | PRN
  Administered 2018-12-17: 25 ug via INTRAVENOUS

## 2018-12-17 MED ORDER — SODIUM CHLORIDE 0.9 % IV SOLN
INTRAVENOUS | Status: AC
  Administered 2018-12-17: 09:00:00 via INTRAVENOUS

## 2018-12-17 MED ORDER — SODIUM CHLORIDE 0.9% FLUSH
3.0000 mL | Freq: Two times a day (BID) | INTRAVENOUS | Status: DC
  Administered 2018-12-17 – 2018-12-18 (×4): 3 mL via INTRAVENOUS

## 2018-12-17 MED ORDER — VERAPAMIL HCL 2.5 MG/ML IV SOLN
INTRAVENOUS | Status: AC
  Filled 2018-12-17: qty 2

## 2018-12-17 MED ORDER — LIDOCAINE HCL (PF) 1 % IJ SOLN
INTRAMUSCULAR | Status: DC | PRN
  Administered 2018-12-17: 2 mL

## 2018-12-17 MED ORDER — HEPARIN (PORCINE) IN NACL 1000-0.9 UT/500ML-% IV SOLN
INTRAVENOUS | Status: AC
  Filled 2018-12-17: qty 1000

## 2018-12-17 MED ORDER — ONDANSETRON HCL 4 MG/2ML IJ SOLN: 4.0000 mg | Freq: Four times a day (QID) | INTRAMUSCULAR | Status: DC | PRN

## 2018-12-17 MED ORDER — MIDAZOLAM HCL 2 MG/2ML IJ SOLN
INTRAMUSCULAR | Status: DC | PRN
  Administered 2018-12-17 (×2): 1 mg via INTRAVENOUS

## 2018-12-17 MED ORDER — LIDOCAINE HCL (PF) 1 % IJ SOLN
INTRAMUSCULAR | Status: AC
  Filled 2018-12-17: qty 30

## 2018-12-17 MED ORDER — FUROSEMIDE 10 MG/ML IJ SOLN
40.0000 mg | Freq: Once | INTRAMUSCULAR | Status: AC
  Administered 2018-12-17: 40 mg via INTRAVENOUS
  Filled 2018-12-17: qty 4

## 2018-12-17 MED ORDER — SODIUM CHLORIDE 0.9 % IV SOLN: 250.0000 mL | INTRAVENOUS | Status: DC | PRN

## 2018-12-17 MED ORDER — LISINOPRIL 20 MG PO TABS
20.0000 mg | ORAL_TABLET | Freq: Every day | ORAL | Status: DC
  Administered 2018-12-17 – 2018-12-18 (×2): 20 mg via ORAL
  Filled 2018-12-17 (×2): qty 1

## 2018-12-17 MED ORDER — HEPARIN SODIUM (PORCINE) 1000 UNIT/ML IJ SOLN
INTRAMUSCULAR | Status: DC | PRN
  Administered 2018-12-17: 3500 [IU] via INTRAVENOUS

## 2018-12-17 MED ORDER — HYDROCHLOROTHIAZIDE 12.5 MG PO CAPS: 12.5000 mg | ORAL_CAPSULE | Freq: Every day | ORAL | Status: DC

## 2018-12-17 MED ORDER — IOHEXOL 350 MG/ML SOLN
INTRAVENOUS | Status: DC | PRN
  Administered 2018-12-17: 75 mL via INTRA_ARTERIAL

## 2018-12-17 MED ORDER — FENTANYL CITRATE (PF) 100 MCG/2ML IJ SOLN
INTRAMUSCULAR | Status: AC
  Filled 2018-12-17: qty 2

## 2018-12-17 MED ORDER — DIAZEPAM 5 MG PO TABS: 5.0000 mg | ORAL_TABLET | Freq: Four times a day (QID) | ORAL | Status: DC | PRN

## 2018-12-17 MED ORDER — SODIUM CHLORIDE 0.9% FLUSH: 3.0000 mL | INTRAVENOUS | Status: DC | PRN

## 2018-12-17 MED ORDER — ATORVASTATIN CALCIUM 80 MG PO TABS
80.0000 mg | ORAL_TABLET | Freq: Every day | ORAL | Status: DC
  Administered 2018-12-17 – 2018-12-18 (×2): 80 mg via ORAL
  Filled 2018-12-17 (×2): qty 1

## 2018-12-17 MED ORDER — CARVEDILOL 6.25 MG PO TABS
6.2500 mg | ORAL_TABLET | Freq: Two times a day (BID) | ORAL | Status: DC
  Administered 2018-12-17 – 2018-12-18 (×3): 6.25 mg via ORAL
  Filled 2018-12-17 (×3): qty 1

## 2018-12-17 MED ORDER — ALBUTEROL SULFATE (2.5 MG/3ML) 0.083% IN NEBU
2.5000 mg | INHALATION_SOLUTION | Freq: Once | RESPIRATORY_TRACT | Status: AC
  Administered 2018-12-17: 2.5 mg via RESPIRATORY_TRACT

## 2018-12-17 MED ORDER — HEPARIN (PORCINE) IN NACL 1000-0.9 UT/500ML-% IV SOLN
INTRAVENOUS | Status: DC | PRN
  Administered 2018-12-17 (×2): 500 mL

## 2018-12-17 SURGICAL SUPPLY — 10 items
CATH INFINITI JR4 5F (CATHETERS) ×2 IMPLANT
CATH OPTITORQUE TIG 4.0 5F (CATHETERS) ×2 IMPLANT
DEVICE RAD COMP TR BAND LRG (VASCULAR PRODUCTS) ×2 IMPLANT
GLIDESHEATH SLEND SS 6F .021 (SHEATH) ×2 IMPLANT
GUIDEWIRE INQWIRE 1.5J.035X260 (WIRE) ×1 IMPLANT
INQWIRE 1.5J .035X260CM (WIRE) ×2
KIT HEART LEFT (KITS) ×2 IMPLANT
PACK CARDIAC CATHETERIZATION (CUSTOM PROCEDURE TRAY) ×2 IMPLANT
TRANSDUCER W/STOPCOCK (MISCELLANEOUS) ×2 IMPLANT
TUBING CIL FLEX 10 FLL-RA (TUBING) ×2 IMPLANT

## 2018-12-17 NOTE — Consult Note (Addendum)
[address]       [place] [address]             [phone]        Melissa Gonzales [facility] Medical Record #[id number] Date of Birth: 13-Mar-1941  Referring: No ref. provider found Primary Care: Rodrigo Ran, MD Primary Cardiologist:Tiffany Duke Salvia, MD  Chief Complaint:    Chief Complaint  Patient presents with  . Palpitations    History of Present Illness:    The patient is a 78 year old female who presented to the emergency department on 12/15/2018 with a chief complaint of palpitations.  She noted the symptoms for approximately 3 days and they began to occur more frequently.  On the date of presentation it was associated with chest pain with radiation into her left arm.  Additionally she noted shortness of breath and called 911.  Upon arrival of EMS the symptoms had resolved although she did feel weak with a sense of lightheadedness.  Patient has no prior history of coronary artery disease.  She does have risk factors including hyperlipidemia and hypertension.  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  A cardiac CT scan with coronary score was dramatically elevated with a calcium score of 3,220 which was in the 99th percentile for age and sex match control.  She also had findings consistent with three-vessel disease.  Cardiology consultation was obtained.  They felt she should undergo cardiac catheterization which was done on today's date.  The full report is listed below.  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  She has no history of diabetes but her hemoglobin A1c is noted to be elevated at 5.8.  Additionally her TSH is elevated at  5.337.  She has been started on Synthroid.  Her current lipid panel is fairly unremarkable with LDL-C at 97 with an excellent triglyceride to HDL ratio.  She is on current high-dose statin dosing.  Her troponins were not elevated x4.  Her serum folate level is greater than 620.  The patient does state that she has been under significant stress lately.  A 2D echocardiogram showed EF greater than 65%.  The report is listed below.  A CT angiogram was also obtained during her evaluation and showed no PE.  It did show some mild scarring versus atelectasis in the mid to lower lungs bilaterally.  A lower extremity venous duplex was also negative.    Current Activity/ Functional Status: Patient is independent with mobility/ambulation, transfers, ADL's, IADL's.   Zubrod Score: At the time of surgery this patient's most appropriate activity status/level should be described as: []     0    Normal activity, no symptoms [x]     1    Restricted in physical strenuous activity but ambulatory, able to do out light work []     2    Ambulatory and capable of self care, unable to do work activities, up and about                 more than 50%  Of the time                            []   3    Only limited self care, in bed greater than 50% of waking hours []     4    Completely disabled, no self care, confined to bed or chair []     5    Moribund  Past Medical History:  Diagnosis Date  . Anxiety   . Arthritis   . Depression   . Diverticulosis   . GERD (gastroesophageal reflux disease)   . Glaucoma    Bilateral eyes  . Hyperlipidemia   . Hypertension   . Peripheral neuropathy   . Pneumonia    hx of  . Urgency of urination     Past Surgical History:  Procedure Laterality Date  . BACK SURGERY    . CHOLECYSTECTOMY  1993  . COLONOSCOPY    . EYE SURGERY     Cataract Surgery Bilateral, 5 years ago  . LEFT HEART CATH AND CORONARY ANGIOGRAPHY N/A 12/17/2018   Procedure: LEFT HEART CATH AND CORONARY ANGIOGRAPHY;   Surgeon: Lennette Bihari, MD;  Location: [facility]AS[facility];  Service: Cardiovascular;  Laterality: N/A;  . MAXIMUM ACCESS (MAS)POSTERIOR LUMBAR INTERBODY FUSION (PLIF) 1 LEVEL N/A 03/17/2015   Procedure: Lumbar five -sacral one Maximum access posterior lumbar interbody fusion/Lumbar two-three  Laminectomy and extension of instrumentation to Sacral-one;  Surgeon: Tia Alert, MD;  Location: [facility]URO [facility];  Service: Neurosurgery;  Laterality: N/A;  . MAXIMUM ACCESS (MAS)POSTERIOR LUMBAR INTERBODY FUSION (PLIF) 2 LEVEL N/A 11/27/2014   Procedure: LUMBAR THREE-FOUR, LUMBAR FOUR-FIVE MAXIMUM ACCESS POSTERIOR LUMBAR INTERBODY FUSION;  Surgeon: Tia Alert, MD;  Location: [facility];  Service: Neurosurgery;  Laterality: N/A;  L3-4 L4-5 MAXIMUM ACCESS POSTERIOR LUMBAR INTERBODY FUSION  . VAGINAL HYSTERECTOMY  1970s   uterine prolapse    Social History   Tobacco Use  Smoking Status Never Smoker  Smokeless Tobacco Never Used    Social History   Substance and Sexual Activity  Alcohol Use Yes  . Alcohol/week: 1.0 standard drinks  . Types: 1 Glasses of wine per week   Comment: 1-2 glass per night, sometimes none     No Known Allergies  Current Facility-Administered Medications  Medication Dose Route Frequency Provider Last Rate Last Dose  . 0.9 %  sodium chloride infusion   Intravenous Continuous Lennette Bihari, MD 100 mL/hr at 12/17/18 0929    . 0.9 %  sodium chloride infusion  250 mL Intravenous PRN Lennette Bihari, MD      . acetaminophen (TYLENOL) tablet 650 mg  650 mg Oral Q4H PRN Lennette Bihari, MD   650 mg at 12/17/18 1014  . ALPRAZolam Prudy Feeler) tablet 0.25 mg  0.25 mg Oral BID PRN Therisa Doyne, MD   0.25 mg at 12/17/18 1059  . aspirin 81 MG chewable tablet           . [START ON 12/18/2018] aspirin chewable tablet 81 mg  81 mg Oral Daily Nicki Guadalajara A, MD      . atorvastatin (LIPITOR) tablet 80 mg  80 mg Oral q1800 Nicki Guadalajara A, MD      . brimonidine (ALPHAGAN) 0.15 %  ophthalmic solution 1 drop  1 drop Both Eyes Q12H Doutova, Anastassia, MD   1 drop at 12/17/18 1020  . carvedilol (COREG) tablet 3.125 mg  3.125 mg Oral BID WC Chilton Si, MD   3.125 mg at 12/17/18 1101  . diazepam (VALIUM) tablet 5 mg  5 mg Oral Q6H PRN Lennette Bihari, MD      .  dorzolamide-timolol (COSOPT) 22.3-6.8 MG/ML ophthalmic solution 1 drop  1 drop Both Eyes BID Doutova, Anastassia, MD   1 drop at 12/17/18 1020  . [START ON 12/18/2018] enoxaparin (LOVENOX) injection 40 mg  40 mg Subcutaneous Q24H Lennette Bihari, MD      . ezetimibe (ZETIA) tablet 10 mg  10 mg Oral Daily Smith, Rondell A, MD   10 mg at 12/17/18 1014  . furosemide (LASIX) injection 40 mg  40 mg Intravenous Once Marjie Skiff E, PA-C      . HYDROcodone-acetaminophen (NORCO/VICODIN) 5-325 MG per tablet 1-2 tablet  1-2 tablet Oral Q4H PRN Doutova, Anastassia, MD      . latanoprost (XALATAN) 0.005 % ophthalmic solution 1 drop  1 drop Both Eyes QHS Katrinka Blazing, Rondell A, MD   1 drop at 12/16/18 2212  . levothyroxine (SYNTHROID, LEVOTHROID) tablet 25 mcg  25 mcg Oral Q0600 Madelyn Flavors A, MD   25 mcg at 12/17/18 0546  . lisinopril (PRINIVIL,ZESTRIL) tablet 20 mg  20 mg Oral Daily Smith, Rondell A, MD      . multivitamin with minerals tablet 1 tablet  1 tablet Oral Daily Madelyn Flavors A, MD   1 tablet at 12/17/18 1014  . nitrofurantoin (MACRODANTIN) capsule 100 mg  100 mg Oral Daily Katrinka Blazing, Rondell A, MD   100 mg at 12/17/18 1014  . ondansetron (ZOFRAN) injection 4 mg  4 mg Intravenous Q6H PRN Lennette Bihari, MD      . ondansetron [facility]) tablet 4 mg  4 mg Oral Q6H PRN Doutova, Anastassia, MD      . pantoprazole (PROTONIX) EC tablet 40 mg  40 mg Oral Daily Doutova, Anastassia, MD   40 mg at 12/17/18 1014  . pentosan polysulfate (ELMIRON) capsule 100 mg  100 mg Oral TID Therisa Doyne, MD   100 mg at 12/17/18 1015  . sodium chloride flush (NS) 0.9 % injection 3 mL  3 mL Intravenous Q12H Nicki Guadalajara A, MD      .  sodium chloride flush (NS) 0.9 % injection 3 mL  3 mL Intravenous PRN Lennette Bihari, MD      . thiamine (VITAMIN B-1) tablet 100 mg  100 mg Oral Daily Katrinka Blazing, Rondell A, MD   100 mg at 12/17/18 1014    Medications Prior to Admission  Medication Sig Dispense Refill Last Dose  . ALPHAGAN P 0.1 % SOLN Place 1 drop into both eyes every 12 (twelve) hours.    12/14/2018 at Unknown time  . ALPRAZolam (XANAX) 0.25 MG tablet TAKE 1 TABLET BY MOUTH TWICE DAILY AS NEEDED FOR ANXIETY (Patient taking differently: Take 0.25 mg by mouth 2 (two) times daily as needed for anxiety. ) 30 tablet 0 12/14/2018 at Unknown time  . atorvastatin (LIPITOR) 80 MG tablet Take 80 mg by mouth daily.   12/14/2018 at Unknown time  . Calcium Carb-Cholecalciferol (CALCIUM 600 + D PO) Take 1 tablet by mouth daily.    12/14/2018 at Unknown time  . dorzolamide-timolol (COSOPT) 22.3-6.8 MG/ML ophthalmic solution Place 1 drop into both eyes 2 (two) times daily.    12/14/2018 at Unknown time  . ELMIRON 100 MG capsule Take 100 mg by mouth 3 (three) times daily.   12/14/2018 at Unknown time  . esomeprazole (NEXIUM) 40 MG capsule Take 1 capsule (40 mg total) by mouth daily before breakfast. 90 capsule 3 12/14/2018 at Unknown time  . lisinopril (PRINIVIL,ZESTRIL) 20 MG tablet take 1 tablet by mouth once daily 90 tablet 3  12/14/2018 at Unknown time  . Meth-Hyo-M Bl-Na Phos-Ph Sal (URIBEL) 118 MG CAPS Take 1 capsule by mouth 2 (two) times daily as needed (for pain).    Past Month at Unknown time  . Multiple Vitamins-Minerals (MULTIVITAMIN WITH MINERALS) tablet Take 1 tablet by mouth daily.   12/14/2018 at Unknown time  . Netarsudil-Latanoprost 0.02-0.005 % SOLN Place 1 drop into both eyes at bedtime.   12/14/2018 at Unknown time  . nitrofurantoin (MACRODANTIN) 100 MG capsule Take 100 mg by mouth daily.   12/14/2018 at Unknown time  . Omega-3 Fatty Acids (FISH OIL) 1000 MG CAPS Take 1 capsule by mouth 2 (two) times daily.   12/14/2018 at Unknown time  . vitamin B-12  (CYANOCOBALAMIN) 100 MCG tablet Take 100 mcg by mouth daily.   12/14/2018 at Unknown time  . atorvastatin (LIPITOR) 40 MG tablet take 1 tablet by mouth once daily (Patient not taking: Reported on 12/15/2018) 90 tablet 3 Not Taking at Unknown time  . azithromycin (ZITHROMAX) 500 MG tablet Take 1 tablet (500 mg total) by mouth daily. (Patient not taking: Reported on 12/21/2015) 3 tablet 0 Completed Course at Unknown time  . benzonatate (TESSALON) 200 MG capsule Take 1 capsule (200 mg total) by mouth 3 (three) times daily as needed for cough. (Patient not taking: Reported on 12/21/2015) 20 capsule 0 Completed Course at Unknown time  . cefpodoxime (VANTIN) 200 MG tablet Take 1 tablet (200 mg total) by mouth every 12 (twelve) hours. (Patient not taking: Reported on 12/21/2015) 12 tablet 0 Completed Course at Unknown time  . diclofenac sodium (VOLTAREN) 1 % GEL Apply 2 g topically 4 (four) times daily. (Patient not taking: Reported on 12/15/2018) 3 Tube 3 Not Taking at Unknown time  . hydrochlorothiazide (MICROZIDE) 12.5 MG capsule Take 1 capsule (12.5 mg total) by mouth daily. (Patient not taking: Reported on 12/21/2015) 90 capsule 3 Not Taking at Unknown time  . lidocaine (LIDODERM) 5 % Place 1 patch onto the skin daily. Remove & Discard patch within 12 hours or as directed by MD (Patient not taking: Reported on 10/05/2017) 30 patch 0 Not Taking at Unknown time  . oxybutynin (DITROPAN-XL) 10 MG 24 hr tablet Take 1 tablet (10 mg total) by mouth daily. (Patient not taking: Reported on 10/25/2015) 30 tablet 6 Not Taking at Unknown time  . oxyCODONE (OXY IR/ROXICODONE) 5 MG immediate release tablet Take 1 tablet (5 mg total) by mouth every 6 (six) hours as needed for severe pain. (Patient not taking: Reported on 11/23/2015) 30 tablet 0 Completed Course at Unknown time  . sertraline (ZOLOFT) 100 MG tablet Take 1 tablet (100 mg total) by mouth daily. (Patient not taking: Reported on 10/05/2017) 90 tablet 3 Not Taking at  Unknown time  . tiZANidine (ZANAFLEX) 4 MG tablet Take 1 tablet (4 mg total) by mouth every 8 (eight) hours as needed for muscle spasms. (Patient not taking: Reported on 11/23/2015) 60 tablet 1 Completed Course at Unknown time    Family History  Problem Relation Age of Onset  . Mental illness Daughter   . Drug abuse Grandchild   . Heart disease Mother 64       died CHF age 68  . Hyperlipidemia Sister   . Hypertension Sister   . Cancer Father        Primary Cell Liver Cancer  . Diabetes Neg Hx    LEFT HEART CATH AND CORONARY ANGIOGRAPHY  Conclusion     Ost RCA to Mid RCA lesion is  100% stenosed.  Prox Cx lesion is 40% stenosed.  Mid Cx to Dist Cx lesion is 85% stenosed.  RPDA lesion is 95% stenosed.  Ost RPDA to RPDA lesion is 90% stenosed.  Dist LM to Ost LAD lesion is 35% stenosed.  Prox LAD lesion is 70% stenosed.  Prox LAD to Mid LAD lesion is 90% stenosed.   Severe extensive multivessel coronary calcification with significant three-vessel coronary obstructive disease.  The LAD has been 70% proximal stenoses and is a twin like vessel with 90% diffuse mid LAD stenosis.  The distal LAD does not reach the apex.  The circumflex vessel is very large caliber with extensive calcification with 40% proximal stenosis and 85% AV groove stenosis after the second marginal vessel and there is extensive collateralization to the right coronary artery via the circumflex vessel with 95 to 90% stenoses in the collateralized PDA vessel.  The RCA is extensively calcified and occluded at its ostium.  LVEDP 15 mm Hg.  RECOMMENDATION: Surgical consult for CABG revascularization.  Aggressive lipid-lowering therapy and optimal blood pressure to less than 130/80.   Recommendations   Antiplatelet/Anticoag Recommend Aspirin 81mg  daily for moderate CAD.  Indications   Coronary artery calcification [I25.10, I25.84 (ICD-10-CM)]  Angina pectoris (HCC) [I20.9 (ICD-10-CM)]  Procedural Details     Technical Details Clay Menser is a 78 year old female has a history of hypertension, hyperlipidemia, GERD, and has been felt at times to have atypical chest pain. She was recently admitted with recurrent symptoms. She was noted to have coronary calcification on CT imaging. A coronary CTA revealed markedly elevated calcium score at 3320. She is referred for definitive cardiac catheterization.  The patient was brought to the cardiac catheterization lab in the fasting state. The patient was premedicated with Versed 2 mg and fentanyl 25 mcg. A right radial approach was utilized after an Allen's test verified adequate circulation. The right radial artery was punctured via the Seldinger technique, and a 6 [place] Glidesheath Slender was inserted without difficulty. A radial cocktail consisting of Verapamil 3 mg was administered. The patient received weight adjusted heparin. A safety J wire was advanced into the ascending aorta. Diagnostic catheterization was done with a 5 [place] TIG 4.0 and JR4 catheters. Since the patient had just had an echo which showed an EF of 60 to 65%, LV pressures were recorded but left ventriculography was not done. A TR radial band was applied for hemostasis. The patient left the catheterization laboratory in stable condition.   Estimated blood loss <50 mL.   During this procedure medications were administered to achieve and maintain moderate conscious sedation while the patient's heart rate, blood pressure, and oxygen saturation were continuously monitored and I was present face-to-face 100% of this time.  Medications  (Filter: Administrations occurring from 12/17/18 0716 to 12/17/18 1610)  Medication Rate/Dose/Volume Action  Date Time   midazolam (VERSED) injection (mg) 1 mg Given 12/17/18 0745   Total dose as of 12/17/18 1205 1 mg Given 0754   2 mg        fentaNYL (SUBLIMAZE) injection (mcg) 25 mcg Given 12/17/18 0745   Total dose as of 12/17/18 1205        25 mcg         Radial Cocktail/Verapamil only (mL) 10 mL Given 12/17/18 0753   Total dose as of 12/17/18 1205        10 mL        lidocaine (PF) (XYLOCAINE) 1 % injection (mL) 2 mL Given 12/17/18 0750  Total dose as of 12/17/18 1205        2 mL        Heparin (Porcine) in NaCl 1000-0.9 UT/500ML-% SOLN (mL) 500 mL Given 12/17/18 0754   Total dose as of 12/17/18 1205 500 mL Given 0754   1,000 mL        heparin injection (Units) 3,500 Units Given 12/17/18 0755   Total dose as of 12/17/18 1205        3,500 Units        iohexol (OMNIPAQUE) 350 MG/ML injection (mL) 75 mL Given 12/17/18 0813   Total dose as of 12/17/18 1205        75 mL        carvedilol (COREG) tablet 3.125 mg (mg) *Not included in total Automatically Held 12/17/18 0800   Dosing weight:  69 kg        Total dose as of 12/17/18 1205        Cannot be calculated        Sedation Time   Sedation Time Physician-1: 24 minutes 3 seconds  Coronary Findings   Diagnostic  Dominance: Right  Left Main  Dist LM to Ost LAD lesion 35% stenosed  Dist LM to Ost LAD lesion is 35% stenosed.  Left Anterior Descending  Prox LAD lesion 70% stenosed  Prox LAD lesion is 70% stenosed.  Prox LAD to Mid LAD lesion 90% stenosed  Prox LAD to Mid LAD lesion is 90% stenosed.  Left Circumflex  Prox Cx lesion 40% stenosed  Prox Cx lesion is 40% stenosed.  Mid Cx to Dist Cx lesion 85% stenosed  Mid Cx to Dist Cx lesion is 85% stenosed.  Right Coronary Artery  Ost RCA to Mid RCA lesion 100% stenosed  Ost RCA to Mid RCA lesion is 100% stenosed.  Right Posterior Descending Artery  Collaterals  RPDA filled by collaterals from 2nd Mrg.    Ost RPDA to RPDA lesion 90% stenosed  Ost RPDA to RPDA lesion is 90% stenosed.  RPDA lesion 95% stenosed  RPDA lesion is 95% stenosed.  Right Posterior Atrioventricular Branch  Collaterals  Post Atrio filled by collaterals from Dist Cx.    Intervention   No interventions have been documented.  Left Heart   Left  Ventricle LVEDP 15 mm Hg.  Coronary Diagrams   Diagnostic  Dominance: Right    Intervention   Result status: Final result    ECHOCARDIOGRAM REPORT       Patient Name:   Melissa Gonzales Date of Exam: 12/15/2018 Medical Rec #:  [id number]       Height:       64.0 in Accession #:    [id number]      Weight:       150.0 lb Date of Birth:  1941-02-07       BSA:          1.73 m Patient Age:    77 years        BP:           160/69 mmHg Patient Gender: F               HR:           85 bpm. Exam Location:  Inpatient    Procedure: 2D Echo  Indications:    Palpitations   History:        Patient has no prior history of Echocardiogram examinations.  Risk Factors: Hypertension and Dyslipidemia.   Sonographer:    Lavenia Atlas Referring Phys: [phone] ANASTASSIA DOUTOVA  IMPRESSIONS    1. The left ventricle has hyperdynamic systolic function of >65%. The cavity size was normal. There is mildly increased left ventricular wall thickness. Echo evidence of impaired diastolic relaxation Elevated left ventricular end-diastolic pressure The  E/e' is 15.6.  2. The right ventricle has normal systolic function. The cavity was normal. There is no increase in right ventricular wall thickness.  3. The mitral valve is normal in structure. There is mild mitral annular calcification present.  4. The tricuspid valve is normal in structure.Tricuspid valve regurgitation is mild by color flow Doppler.  5. The aortic valve is tricuspid Aortic valve regurgitation is moderate by color flow Doppler.  6. The pulmonic valve was normal in structure.  FINDINGS  Left Ventricle: The left ventricle has hyperdynamic systolic function of >65%. The cavity size was normal. There is mildly increased left ventricular wall thickness. Echo evidence of impaired diastolic relaxation Elevated left ventricular end-diastolic  pressure The E/e' is 15.6. Right Ventricle: The right ventricle has normal systolic  function. The cavity was normal. There is no increase in right ventricular wall thickness. Left Atrium: left atrial size was normal in size Right Atrium: right atrial size was normal in size Interatrial Septum: No atrial level shunt detected by color flow Doppler.  Pericardium: There is no evidence of pericardial effusion. Mitral Valve: The mitral valve is normal in structure. There is mild mitral annular calcification present. Mitral valve regurgitation is not visualized by color flow Doppler. Tricuspid Valve: The tricuspid valve is normal in structure. Tricuspid valve regurgitation is mild by color flow Doppler. Aortic Valve: The aortic valve is tricuspid Aortic valve regurgitation is moderate by color flow Doppler. Pulmonic Valve: The pulmonic valve was normal in structure. Pulmonic valve regurgitation is not visualized by color flow Doppler. Venous: The inferior vena cava is normal in size with greater than 50% respiratory variability.   LEFT VENTRICLE PLAX 2D (Teich) LV EF:          68.1 %   Diastology LVIDd:          3.85 cm  LV e' lateral:   5.98 cm/s LVIDs:          2.41 cm  LV E/e' lateral: 15.4 LV PW:          1.17 cm  LV e' medial:    5.87 cm/s LV IVS:         1.10 cm  LV E/e' medial:  15.6 LVOT diam:      1.90 cm LV SV:          44 ml LVOT Area:      2.84 cm  RIGHT VENTRICLE RV Basal diam:  2.40 cm RV S prime:     18.90 cm/s TAPSE (M-mode): 2.0 cm RVSP:           28.1 mmHg  LEFT ATRIUM             Index       RIGHT ATRIUM           Index LA diam:        3.30 cm 1.91 cm/m  RA Pressure: 3 mmHg LA Vol (A2C):   36.6 ml 21.14 ml/m RA Area:     11.30 cm LA Vol (A4C):   18.1 ml 10.46 ml/m RA Volume:   24.40 ml  14.09 ml/m LA Biplane Vol: 25.8 ml 14.90  ml/m  AORTIC VALVE LVOT Vmax:   168.00 cm/s LVOT Vmean:  101.000 cm/s LVOT VTI:    0.302 m AR PHT:      428 msec   AORTA Ao Root diam: 2.90 cm  MITRAL VALVE               TR Peak grad: 25.1 mmHg MV Area (PHT):  7.44 cm    TR Vmax:      277.00 cm/s MV PHT:        29.58 msec  RVSP:         28.1 mmHg MV Decel Time: 102 msec MV E velocity: 91.80 cm/s MV A velocity: 136.00 cm/s MV E/A ratio:  0.68    Armanda Magicraci Turner MD Electronically signed by Armanda Magicraci Turner MD Signature Date/Time: 12/15/2018/3:19:20 PM       Final       Review of Systems:   Review of Systems  Constitutional: Positive for malaise/fatigue. Negative for chills, diaphoresis, fever and weight loss.  HENT: Positive for hearing loss. Negative for congestion, ear discharge, ear pain, nosebleeds, sinus pain, sore throat and tinnitus.   Eyes: Negative for blurred vision, double vision, photophobia, pain, discharge and redness.       Advanced glaucoma  Respiratory: Positive for cough and shortness of breath. Negative for hemoptysis, sputum production, wheezing and stridor.   Cardiovascular: Positive for chest pain, palpitations and leg swelling. Negative for orthopnea and claudication.  Gastrointestinal: Positive for diarrhea. Negative for abdominal pain, blood in stool, constipation, heartburn, melena, nausea and vomiting.       Microscopic lymphatic colitis  Genitourinary: Negative for dysuria, flank pain, frequency, hematuria and urgency.       Intersticial cystitis 4 tumors from bladder Some incontinence of urine Also had immunotherapy   Musculoskeletal: Positive for back pain, joint pain and neck pain. Negative for falls and myalgias.       Arthritis, back/knees/hand sx Spine fused L3-S1  Skin: Negative for itching and rash.       Has chronic psoriasis  Neurological: Positive for tingling, sensory change, weakness and headaches. Negative for dizziness, tremors, speech change, focal weakness, seizures and loss of consciousness.       Neuropathy in feet  Endo/Heme/Allergies: Positive for environmental allergies. Negative for polydipsia. Bruises/bleeds easily.  Psychiatric/Behavioral: Positive for depression. Negative for  hallucinations, memory loss, substance abuse and suicidal ideas. The patient is nervous/anxious. The patient does not have insomnia.      Physical Exam: BP (!) 151/79   Pulse 78   Temp 98.3 F (36.8 C) (Oral)   Resp 16   Ht 5\' 4"  (1.626 m)   Wt 68.3 kg   SpO2 100%   BMI 25.83 kg/m    Physical Exam  Constitutional: She appears healthy. No distress.  HENT:  Mouth/Throat: Dentition is normal. No dental caries. Oropharynx is clear. Pharynx is normal.  Eyes: Pupils are equal, round, and reactive to light.  Neck: Normal range of motion and thyroid normal. Neck supple. No JVD present. No neck adenopathy. No thyromegaly present.  Cardiovascular: Regular rhythm, S1 normal and normal heart sounds. Exam reveals decreased pulses. Exam reveals no gallop and no distant heart sounds.  No murmur heard. Absent left PT pulse to palp  Pulmonary/Chest: Effort normal and breath sounds normal. She has no wheezes. She has no rales. She exhibits no tenderness.  Abdominal: Bowel sounds are normal. She exhibits no distension and no mass. There is no hepatomegaly. There is no abdominal tenderness.  Musculoskeletal:  General: No tenderness, deformity or edema.  Neurological: She is alert and oriented to person, place, and time. She has normal motor skills.  Skin: Skin is warm and dry. No cyanosis. No jaundice or pallor. Nails show no clubbing.   Diagnostic Studies & Laboratory data:     Recent Radiology Findings:   Ct Coronary Morph W/cta Cor W/score W/ca W/cm &/or Wo/cm  Addendum Date: 12/16/2018   ADDENDUM REPORT: 12/16/2018 15:29 CLINICAL DATA:  21F with hypertension, hyperlipidemia, GERD and chest pain. EXAM: Cardiac/Coronary  CT TECHNIQUE: The patient was scanned on a [employer]. FINDINGS: A 120 kV prospective scan was triggered in the descending thoracic aorta at 111 HU's. Axial non-contrast 3 mm slices were carried out through the heart. The data set was analyzed on a dedicated  work station and scored using the Agatson method. Gantry rotation speed was 250 msecs and collimation was .6 mm. No beta blockade and 0.8 mg of sl NTG was given. The 3D data set was reconstructed in 5% intervals of the 67-82 % of the R-R cycle. Diastolic phases were analyzed on a dedicated work station using MPR, MIP and VRT modes. The patient received 80 cc of contrast. Aorta:  Normal size.  calcifications.  No dissection. Aortic Valve:  Trileaflet.  Mild calcification of the aortic root. Coronary Arteries:  Normal coronary origin.  Right dominance. RCA is a large dominant artery.  Diffuse calcification. Left main is a large artery that gives rise to LAD and LCX arteries. There is minimal calcified plaque. LAD is a large vessel.  There is diffuse calcification. LCX is a non-dominant artery that is heavily calcified. Other findings: The degree of coronary stenosis is not interpretable due to mis-timing of the contrast bolus. IMPRESSION: 1. Coronary calcium score of 3220. This was 99th percentile for age and sex matched control. 2. Normal coronary origin with right dominance. 3. The degree of coronary stenosis is not interpretable due to mis-timing of the contrast bolus. 4. Three vessel CAD noted. 5. Given very high coronary calcium score and ischemic symptoms, recommend cardiac catheterization. 6. Recommend aggressive risk factor modification including high potency statin. Chilton Si, MD Electronically Signed   By: Chilton Si   On: 12/16/2018 15:29   Result Date: 12/16/2018 EXAM: OVER-READ INTERPRETATION  CT CHEST The following report is an over-read performed by radiologist Dr. Jeronimo Greaves of [facility] on 12/16/2018. This over-read does not include interpretation of cardiac or coronary anatomy or pathology. The coronary CTA interpretation by the cardiologist is attached. COMPARISON:  CTA chest of 1 day prior. FINDINGS: Vascular: Aortic atherosclerosis. Extremely early bolus timing,  limiting evaluation of the aorta. Pulmonary arteries not opacified secondary to bolus timing. Mediastinum/Nodes: No imaged thoracic adenopathy. Lungs/Pleura: No pleural fluid.  Bibasilar scarring. Upper Abdomen: Normal imaged portions of the liver, spleen, stomach. Musculoskeletal: Moderate midthoracic spondylosis. Superior endplate compression deformity at T11 is mild. IMPRESSION: 1.  No acute findings in the imaged extracardiac chest. 2.  Aortic Atherosclerosis (ICD10-I70.0). Electronically Signed: By: Jeronimo Greaves M.D. On: 12/16/2018 13:42   Vas [place] Lower Extremity Venous (dvt)  Result Date: 12/16/2018  Lower Venous Study Indications: SOB, and Palpitation, elevated D-Dimer.  Comparison Study: No prior study on file Performing Technologist: Sherren Kerns RVS  Examination Guidelines: A complete evaluation includes B-mode imaging, spectral Doppler, color Doppler, and power Doppler as needed of all accessible portions of each vessel. Bilateral testing is considered an integral part of a complete examination. Limited examinations for reoccurring indications  may be performed as noted.  Right Venous Findings: +---------+---------------+---------+-----------+----------+-------+          CompressibilityPhasicitySpontaneityPropertiesSummary +---------+---------------+---------+-----------+----------+-------+ CFV      Full           Yes      Yes                          +---------+---------------+---------+-----------+----------+-------+ SFJ      Full                                                 +---------+---------------+---------+-----------+----------+-------+ FV Prox  Full                                                 +---------+---------------+---------+-----------+----------+-------+ FV Mid   Full                                                 +---------+---------------+---------+-----------+----------+-------+ FV DistalFull                                                  +---------+---------------+---------+-----------+----------+-------+ PFV      Full                                                 +---------+---------------+---------+-----------+----------+-------+ POP      Full           Yes      Yes                          +---------+---------------+---------+-----------+----------+-------+ PTV      Full                                                 +---------+---------------+---------+-----------+----------+-------+ PERO     Full                                                 +---------+---------------+---------+-----------+----------+-------+  Left Venous Findings: +---------+---------------+---------+-----------+----------+-------+          CompressibilityPhasicitySpontaneityPropertiesSummary +---------+---------------+---------+-----------+----------+-------+ CFV      Full           Yes      Yes                          +---------+---------------+---------+-----------+----------+-------+ SFJ      Full                                                 +---------+---------------+---------+-----------+----------+-------+  FV Prox  Full                                                 +---------+---------------+---------+-----------+----------+-------+ FV Mid   Full                                                 +---------+---------------+---------+-----------+----------+-------+ FV DistalFull                                                 +---------+---------------+---------+-----------+----------+-------+ PFV      Full                                                 +---------+---------------+---------+-----------+----------+-------+ POP      Full           Yes      Yes                          +---------+---------------+---------+-----------+----------+-------+ PTV      Full                                                  +---------+---------------+---------+-----------+----------+-------+ PERO     Full                                                 +---------+---------------+---------+-----------+----------+-------+    Summary: Right: There is no evidence of deep vein thrombosis in the lower extremity. Left: There is no evidence of deep vein thrombosis in the lower extremity. A cystic structure is found in the popliteal fossa.  *See table(s) above for measurements and observations. Electronically signed by Fabienne Brunsharles Fields MD on 12/16/2018 at 5:19:47 PM.    Final      I have independently reviewed the above radiologic studies and discussed with the patient   Recent Lab Findings: Lab Results  Component Value Date   WBC 5.6 12/17/2018   HGB 13.1 12/17/2018   HCT 40.4 12/17/2018   PLT 168 12/17/2018   GLUCOSE 108 (H) 12/15/2018   CHOL 177 12/15/2018   TRIG 116 12/15/2018   HDL 57 12/15/2018   LDLDIRECT 118 (H) 06/02/2008   LDLCALC 97 12/15/2018   ALT 40 12/15/2018   AST 41 12/15/2018   NA 138 12/15/2018   K 3.5 12/15/2018   CL 102 12/15/2018   CREATININE 0.59 12/17/2018   BUN 12 12/15/2018   CO2 25 12/15/2018   TSH 5.337 (H) 12/14/2018   INR 1.19 10/28/2015   HGBA1C 5.8 (H) 12/15/2018      Assessment / Plan: 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 Hypertension Hyperlipidemia Glaucoma GERD Diverticulosis Anxiety and depression  Elevated TSH with no hx of thyroid dz History of bladder cancer status post resection and immunotherapy   Plan: CABG   I  spent 60 minutes counseling the patient face to face.   Rowe Clack, PA-C 12/17/2018 11:57 AM Pager [phone]  Patient examined, images of coronary angiograms and echocardiogram reviewed and d/w patient. Admitted with unstable anginal symptoms and was found to have severe multivessel CAD, good LV fx. The coronaries are suboptimal for surgical revascularization but high risk CABG is the patients best long  term option  Plan CABG am 2-13

## 2018-12-17 NOTE — Interval H&P Note (Signed)
Cath Lab Visit (complete for each Cath Lab visit)  Clinical Evaluation Leading to the Procedure:   ACS: No.  Non-ACS:    Anginal Classification: CCS III  Anti-ischemic medical therapy: Minimal Therapy (1 class of medications)  Non-Invasive Test Results: High-risk stress test findings: cardiac mortality >3%/year  Prior CABG: No previous CABG      History and Physical Interval Note:  12/17/2018 7:38 AM  Melissa Gonzales  has presented today for surgery, with the diagnosis of cp  The various methods of treatment have been discussed with the patient and family. After consideration of risks, benefits and other options for treatment, the patient has consented to  Procedure(s): LEFT HEART CATH AND CORONARY ANGIOGRAPHY (N/A) as a surgical intervention .  The patient's history has been reviewed, patient examined, no change in status, stable for surgery.  I have reviewed the patient's chart and labs.  Questions were answered to the patient's satisfaction.     Melissa Gonzales

## 2018-12-17 NOTE — Progress Notes (Addendum)
Progress Note  Patient Name: Melissa Gonzales Date of Encounter: 12/17/2018  Primary Cardiologist: Chilton Siiffany Henderson, MD   Subjective   No significant overnight events. No chest pain, shortness of breath, or palpitations. Left heart catheterization showed severe multivessel CAD. CT surgery has been consulted for possible CABG. Patient tearful and anxious.   Inpatient Medications    Scheduled Meds: . aspirin      . aspirin  81 mg Oral Daily  . atorvastatin  80 mg Oral Daily  . atorvastatin  80 mg Oral q1800  . brimonidine  1 drop Both Eyes Q12H  . carvedilol  3.125 mg Oral BID WC  . dorzolamide-timolol  1 drop Both Eyes BID  . enoxaparin (LOVENOX) injection  40 mg Subcutaneous Q24H  . [START ON 12/18/2018] enoxaparin (LOVENOX) injection  40 mg Subcutaneous Q24H  . ezetimibe  10 mg Oral Daily  . latanoprost  1 drop Both Eyes QHS  . levothyroxine  25 mcg Oral Q0600  . multivitamin with minerals  1 tablet Oral Daily  . nitrofurantoin  100 mg Oral Daily  . pantoprazole  40 mg Oral Daily  . pentosan polysulfate  100 mg Oral TID  . sodium chloride flush  3 mL Intravenous Q12H  . thiamine  100 mg Oral Daily   Continuous Infusions: . sodium chloride 100 mL/hr at 12/17/18 0929  . sodium chloride     PRN Meds: sodium chloride, acetaminophen **OR** acetaminophen, acetaminophen, ALPRAZolam, diazepam, HYDROcodone-acetaminophen, ondansetron **OR** ondansetron (ZOFRAN) IV, ondansetron (ZOFRAN) IV, sodium chloride flush   Vital Signs    Vitals:   12/17/18 0741 12/17/18 0855 12/17/18 0910 12/17/18 0925  BP:  (!) 150/69 (!) 168/81 (!) 171/83  Pulse:      Resp:      Temp:      TempSrc:      SpO2: 98% 100%    Weight:      Height:        Intake/Output Summary (Last 24 hours) at 12/17/2018 0933 Last data filed at 12/17/2018 [id number] Gross per 24 hour  Intake 1056.2 ml  Output 5 ml  Net 1051.2 ml   Last 3 Weights 12/17/2018 12/16/2018 12/15/2018  Weight (lbs) 150 lb 8 oz 152 lb 1.6 oz 152  lb 1.6 oz  Weight (kg) 68.266 kg 68.992 kg 68.992 kg      Telemetry    Sinus rhythm with heart rates in the 70's to 90's. - Personally Reviewed  ECG    No new ECG tracings today. - Personally Reviewed  Physical Exam   GEN: Caucasian female resting comfortably. Alert and in no acute distress.   Neck: Supple. No JVD appreciated.  Cardiac: RRR. No murmurs, rubs, or gallops.  TR band on right wrist. Respiratory: No increased work of breathing. Mild crackles noted in bilateral bases. No wheezes or rhonchi appreciated. GI: Soft, non-distended, and non-tender. Bowel sounds present.  MS: No lower extremity edema. Skin: Warm and dry.  Neuro:  No focal deficits. Psych: Normal affect. Responds appropriately.  Labs    Chemistry Recent Labs  Lab 12/14/18 2222 12/15/18 0259  NA 138 138  K 3.5 3.5  CL 102 102  CO2 24 25  GLUCOSE 106* 108*  BUN 14 12  CREATININE 0.60 0.61  CALCIUM 9.2 9.4  PROT  --  6.6  ALBUMIN  --  3.7  AST  --  41  ALT  --  40  ALKPHOS  --  62  BILITOT  --  0.9  GFRNONAA >60 >60  GFRAA >60 >60  ANIONGAP 12 11     Hematology Recent Labs  Lab 12/14/18 2222 12/15/18 0259 12/15/18 0905  WBC 6.8 8.1  --   RBC 4.15 4.16  --   HGB 14.4 14.0  --   HCT 42.2 42.4 42.5  MCV 101.7* 101.9*  --   MCH 34.7* 33.7  --   MCHC 34.1 33.0  --   RDW 12.5 12.3  --   PLT 228 223  --     Cardiac Enzymes Recent Labs  Lab 12/15/18 0018 12/15/18 0535 12/15/18 1247  TROPONINI <0.03 <0.03 <0.03    Recent Labs  Lab 12/14/18 2237  TROPIPOC 0.00     BNPNo results for input(s): BNP, PROBNP in the last 168 hours.   DDimer  Recent Labs  Lab 12/15/18 0018  DDIMER 1.51*     Radiology    Ct Coronary Morph W/cta Cor W/score W/ca W/cm &/or Wo/cm  Addendum Date: 12/16/2018   ADDENDUM REPORT: 12/16/2018 15:29 CLINICAL DATA:  52F with hypertension, hyperlipidemia, GERD and chest pain. EXAM: Cardiac/Coronary  CT TECHNIQUE: The patient was scanned on a [employer]. FINDINGS: A 120 kV prospective scan was triggered in the descending thoracic aorta at 111 HU's. Axial non-contrast 3 mm slices were carried out through the heart. The data set was analyzed on a dedicated work station and scored using the Agatson method. Gantry rotation speed was 250 msecs and collimation was .6 mm. No beta blockade and 0.8 mg of sl NTG was given. The 3D data set was reconstructed in 5% intervals of the 67-82 % of the R-R cycle. Diastolic phases were analyzed on a dedicated work station using MPR, MIP and VRT modes. The patient received 80 cc of contrast. Aorta:  Normal size.  calcifications.  No dissection. Aortic Valve:  Trileaflet.  Mild calcification of the aortic root. Coronary Arteries:  Normal coronary origin.  Right dominance. RCA is a large dominant artery.  Diffuse calcification. Left main is a large artery that gives rise to LAD and LCX arteries. There is minimal calcified plaque. LAD is a large vessel.  There is diffuse calcification. LCX is a non-dominant artery that is heavily calcified. Other findings: The degree of coronary stenosis is not interpretable due to mis-timing of the contrast bolus. IMPRESSION: 1. Coronary calcium score of 3220. This was 99th percentile for age and sex matched control. 2. Normal coronary origin with right dominance. 3. The degree of coronary stenosis is not interpretable due to mis-timing of the contrast bolus. 4. Three vessel CAD noted. 5. Given very high coronary calcium score and ischemic symptoms, recommend cardiac catheterization. 6. Recommend aggressive risk factor modification including high potency statin. Chilton Si, MD Electronically Signed   By: Chilton Si   On: 12/16/2018 15:29   Result Date: 12/16/2018 EXAM: OVER-READ INTERPRETATION  CT CHEST The following report is an over-read performed by radiologist Dr. Jeronimo Greaves of [facility] on 12/16/2018. This over-read does not include interpretation of  cardiac or coronary anatomy or pathology. The coronary CTA interpretation by the cardiologist is attached. COMPARISON:  CTA chest of 1 day prior. FINDINGS: Vascular: Aortic atherosclerosis. Extremely early bolus timing, limiting evaluation of the aorta. Pulmonary arteries not opacified secondary to bolus timing. Mediastinum/Nodes: No imaged thoracic adenopathy. Lungs/Pleura: No pleural fluid.  Bibasilar scarring. Upper Abdomen: Normal imaged portions of the liver, spleen, stomach. Musculoskeletal: Moderate midthoracic spondylosis. Superior endplate compression deformity at T11 is mild. IMPRESSION: 1.  No acute findings in the imaged extracardiac chest. 2.  Aortic Atherosclerosis (ICD10-I70.0). Electronically Signed: By: Jeronimo Greaves M.D. On: 12/16/2018 13:42   Vas [place] Lower Extremity Venous (dvt)  Result Date: 12/16/2018  Lower Venous Study Indications: SOB, and Palpitation, elevated D-Dimer.  Comparison Study: No prior study on file Performing Technologist: Sherren Kerns RVS  Examination Guidelines: A complete evaluation includes B-mode imaging, spectral Doppler, color Doppler, and power Doppler as needed of all accessible portions of each vessel. Bilateral testing is considered an integral part of a complete examination. Limited examinations for reoccurring indications may be performed as noted.  Right Venous Findings: +---------+---------------+---------+-----------+----------+-------+          CompressibilityPhasicitySpontaneityPropertiesSummary +---------+---------------+---------+-----------+----------+-------+ CFV      Full           Yes      Yes                          +---------+---------------+---------+-----------+----------+-------+ SFJ      Full                                                 +---------+---------------+---------+-----------+----------+-------+ FV Prox  Full                                                  +---------+---------------+---------+-----------+----------+-------+ FV Mid   Full                                                 +---------+---------------+---------+-----------+----------+-------+ FV DistalFull                                                 +---------+---------------+---------+-----------+----------+-------+ PFV      Full                                                 +---------+---------------+---------+-----------+----------+-------+ POP      Full           Yes      Yes                          +---------+---------------+---------+-----------+----------+-------+ PTV      Full                                                 +---------+---------------+---------+-----------+----------+-------+ PERO     Full                                                 +---------+---------------+---------+-----------+----------+-------+  Left Venous Findings: +---------+---------------+---------+-----------+----------+-------+          CompressibilityPhasicitySpontaneityPropertiesSummary +---------+---------------+---------+-----------+----------+-------+ CFV      Full           Yes      Yes                          +---------+---------------+---------+-----------+----------+-------+ SFJ      Full                                                 +---------+---------------+---------+-----------+----------+-------+ FV Prox  Full                                                 +---------+---------------+---------+-----------+----------+-------+ FV Mid   Full                                                 +---------+---------------+---------+-----------+----------+-------+ FV DistalFull                                                 +---------+---------------+---------+-----------+----------+-------+ PFV      Full                                                  +---------+---------------+---------+-----------+----------+-------+ POP      Full           Yes      Yes                          +---------+---------------+---------+-----------+----------+-------+ PTV      Full                                                 +---------+---------------+---------+-----------+----------+-------+ PERO     Full                                                 +---------+---------------+---------+-----------+----------+-------+    Summary: Right: There is no evidence of deep vein thrombosis in the lower extremity. Left: There is no evidence of deep vein thrombosis in the lower extremity. A cystic structure is found in the popliteal fossa.  *See table(s) above for measurements and observations. Electronically signed by Fabienne Brunsharles Fields MD on 12/16/2018 at 5:19:47 PM.    Final     Cardiac Studies   Echocardiogram 12/15/2018:  1. The left ventricle has hyperdynamic systolic function of >65%. The cavity size was normal. There is mildly increased left ventricular wall thickness. Echo evidence of impaired diastolic relaxation Elevated  left ventricular end-diastolic pressure The  E/e' is 15.6.  2. The right ventricle has normal systolic function. The cavity was normal. There is no increase in right ventricular wall thickness.  3. The mitral valve is normal in structure. There is mild mitral annular calcification present.  4. The tricuspid valve is normal in structure.Tricuspid valve regurgitation is mild by color flow Doppler.  5. The aortic valve is tricuspid Aortic valve regurgitation is moderate by color flow Doppler.  6. The pulmonic valve was normal in structure. _______________  Left Heart Catheterization 12/17/2018:  Ost RCA to Mid RCA lesion is 100% stenosed.  Prox Cx lesion is 40% stenosed.  Mid Cx to Dist Cx lesion is 85% stenosed.  RPDA lesion is 95% stenosed.  Ost RPDA to RPDA lesion is 90% stenosed.  Dist LM to Ost LAD lesion is 35%  stenosed.  Prox LAD lesion is 70% stenosed.  Prox LAD to Mid LAD lesion is 90% stenosed.   Severe extensive multivessel coronary calcification with significant three-vessel coronary obstructive disease.  The LAD has been 70% proximal stenoses and is a twin like vessel with 90% diffuse mid LAD stenosis.  The distal LAD does not reach the apex.  The circumflex vessel is very large caliber with extensive calcification with 40% proximal stenosis and 85% AV groove stenosis after the second marginal vessel and there is extensive collateralization to the right coronary artery via the circumflex vessel with 95 to 90% stenoses in the collateralized PDA vessel.  The RCA is extensively calcified and occluded at its ostium.  LVEDP 15 mm Hg.  Recommendations: Surgical consult for CABG revascularization.  Aggressive lipid-lowering therapy and optimal blood pressure to less than 130/80.  Patient Profile   Melissa Gonzales is a 78 y.o. female with a history of bladder cancer, habitual wine intake, interstitial cystitis, glaucoma, anxiety, GERD, hypertension, hyperlipidemia, arthritis who is being seen today for the evaluation of palpitations, dyspnea, chest discomfort at the request of Dr. Katrinka Blazing  Assessment & Plan    Chest Pain - Patient presented with chest discomfort, dyspnea, palpitations, and fatigue.  - EKG showed normal sinus rhythm with PVCs. - Troponin x3.  - Patient is currently chest pain free.  - Continue aspirin, beta blocker, and statin.  - Coronary CT showed coronary calcium score of 3220 (99th percentile fore age) with three vessel CAD. Degree of coronary stenosis no interpretable due to mis-timing of the contrast bolus. - Left heart catheterization today showed severe extensive multivessel CAD. CT surgery has been consulted for possible CABG.  Hypertension - Most recent BP 153/81. - Continue Coreg. - Recommend restarting home Lisinopril.   Hyperlipidemia - Lipid panel this admission:  Cholesterol 177, Triglycerides 116, HDL 57, LDL 97. - LDL goal <70 given CAD. - Continue Lipitor 80mg  daily. - Continue Zetia 10mg  daily (added yesterday).  Of note, mild crackles in bilateral bases on exam. Possibly due to fluids pre and post-cath. LVEDP 15 mmHg on catheterization. Will give one dose of IV Lasix 40mg .    For questions or updates, please contact [facility] Please consult [email or web address] for contact info under        Signed, Corrin Parker, PA-C  12/17/2018, 9:33 AM

## 2018-12-17 NOTE — Progress Notes (Signed)
TCTS consulted for CABG evaluation. °

## 2018-12-17 NOTE — Plan of Care (Signed)
  Problem: Clinical Measurements: Goal: Will remain free from infection Outcome: Progressing Note:  No s/s of infection noted. Goal: Respiratory complications will improve Outcome: Progressing Note:  No s/s of respiratory complications noted. Goal: Cardiovascular complication will be avoided Outcome: Progressing Note:  No s/s of cardiovascular complication noted.   

## 2018-12-18 ENCOUNTER — Inpatient Hospital Stay (HOSPITAL_COMMUNITY): Payer: Medicare Other

## 2018-12-18 DIAGNOSIS — Z01810 Encounter for preprocedural cardiovascular examination: Secondary | ICD-10-CM

## 2018-12-18 LAB — URINALYSIS, ROUTINE W REFLEX MICROSCOPIC
Bilirubin Urine: NEGATIVE
Glucose, UA: NEGATIVE mg/dL
Hgb urine dipstick: NEGATIVE
Ketones, ur: NEGATIVE mg/dL
Leukocytes,Ua: NEGATIVE
Nitrite: NEGATIVE
Protein, ur: NEGATIVE mg/dL
Specific Gravity, Urine: 1.003 — ABNORMAL LOW (ref 1.005–1.030)
pH: 6 (ref 5.0–8.0)

## 2018-12-18 LAB — CBC
HCT: 40.7 % (ref 36.0–46.0)
Hemoglobin: 13.8 g/dL (ref 12.0–15.0)
MCH: 34.2 pg — ABNORMAL HIGH (ref 26.0–34.0)
MCHC: 33.9 g/dL (ref 30.0–36.0)
MCV: 101 fL — ABNORMAL HIGH (ref 80.0–100.0)
Platelets: 192 10*3/uL (ref 150–400)
RBC: 4.03 MIL/uL (ref 3.87–5.11)
RDW: 12.5 % (ref 11.5–15.5)
WBC: 5.4 10*3/uL (ref 4.0–10.5)
nRBC: 0 % (ref 0.0–0.2)

## 2018-12-18 LAB — SURGICAL PCR SCREEN
MRSA, PCR: NEGATIVE
Staphylococcus aureus: NEGATIVE

## 2018-12-18 LAB — PROTIME-INR
INR: 0.95
Prothrombin Time: 12.6 seconds (ref 11.4–15.2)

## 2018-12-18 LAB — BASIC METABOLIC PANEL
Anion gap: 9 (ref 5–15)
BUN: 14 mg/dL (ref 8–23)
CO2: 24 mmol/L (ref 22–32)
Calcium: 9.1 mg/dL (ref 8.9–10.3)
Chloride: 107 mmol/L (ref 98–111)
Creatinine, Ser: 0.66 mg/dL (ref 0.44–1.00)
GFR calc Af Amer: >60 mL/min (ref >60)
GFR calc non Af Amer: >60 mL/min (ref >60)
Glucose, Bld: 112 mg/dL — ABNORMAL HIGH (ref 70–99)
Potassium: 3.7 mmol/L (ref 3.5–5.1)
Sodium: 140 mmol/L (ref 135–145)

## 2018-12-18 LAB — BLOOD GAS, ARTERIAL
Acid-base deficit: 3 mmol/L — ABNORMAL HIGH (ref 0.0–2.0)
BICARBONATE: 20.4 mmol/L (ref 20.0–28.0)
Drawn by: 44166
FIO2: 21
O2 Saturation: 96.5 %
Patient temperature: 98.6
pCO2 arterial: 29.8 mmHg — ABNORMAL LOW (ref 32.0–48.0)
pH, Arterial: 7.45 (ref 7.350–7.450)
pO2, Arterial: 85.3 mmHg (ref 83.0–108.0)

## 2018-12-18 LAB — APTT: aPTT: 29 seconds (ref 24–36)

## 2018-12-18 LAB — PREPARE RBC (CROSSMATCH)

## 2018-12-18 MED ORDER — INSULIN REGULAR(HUMAN) IN NACL 100-0.9 UT/100ML-% IV SOLN
INTRAVENOUS | Status: AC
  Administered 2018-12-19: .7 [IU]/h via INTRAVENOUS
  Filled 2018-12-18: qty 100

## 2018-12-18 MED ORDER — SODIUM CHLORIDE 0.9 % IV SOLN
INTRAVENOUS | Status: DC
  Filled 2018-12-18: qty 30

## 2018-12-18 MED ORDER — TRANEXAMIC ACID (OHS) BOLUS VIA INFUSION
15.0000 mg/kg | INTRAVENOUS | Status: AC
  Administered 2018-12-19: 1038 mg via INTRAVENOUS
  Filled 2018-12-18: qty 1038

## 2018-12-18 MED ORDER — TRANEXAMIC ACID (OHS) PUMP PRIME SOLUTION
2.0000 mg/kg | INTRAVENOUS | Status: DC
  Filled 2018-12-18: qty 1.38

## 2018-12-18 MED ORDER — PLASMA-LYTE 148 IV SOLN
INTRAVENOUS | Status: AC
  Administered 2018-12-19: 500 mL
  Filled 2018-12-18: qty 2.5

## 2018-12-18 MED ORDER — POTASSIUM CHLORIDE 2 MEQ/ML IV SOLN
80.0000 meq | INTRAVENOUS | Status: DC
  Filled 2018-12-18: qty 40

## 2018-12-18 MED ORDER — VANCOMYCIN HCL 10 G IV SOLR
1250.0000 mg | INTRAVENOUS | Status: AC
  Administered 2018-12-19: 1250 mg via INTRAVENOUS
  Filled 2018-12-18: qty 1250

## 2018-12-18 MED ORDER — SODIUM CHLORIDE 0.9 % IV SOLN
750.0000 mg | INTRAVENOUS | Status: DC
  Filled 2018-12-18: qty 750

## 2018-12-18 MED ORDER — TEMAZEPAM 7.5 MG PO CAPS
15.0000 mg | ORAL_CAPSULE | Freq: Once | ORAL | Status: AC | PRN
  Administered 2018-12-18: 15 mg via ORAL
  Filled 2018-12-18: qty 2

## 2018-12-18 MED ORDER — CHLORHEXIDINE GLUCONATE 0.12 % MT SOLN
15.0000 mL | Freq: Once | OROMUCOSAL | Status: AC
  Administered 2018-12-19: 15 mL via OROMUCOSAL
  Filled 2018-12-18: qty 15

## 2018-12-18 MED ORDER — CHLORHEXIDINE GLUCONATE 4 % EX LIQD
60.0000 mL | Freq: Once | CUTANEOUS | Status: AC
  Administered 2018-12-18: 4 via TOPICAL
  Filled 2018-12-18: qty 60

## 2018-12-18 MED ORDER — NOREPINEPHRINE 4 MG/250ML-% IV SOLN
0.0000 ug/min | INTRAVENOUS | Status: DC
  Filled 2018-12-18: qty 250

## 2018-12-18 MED ORDER — METOPROLOL TARTRATE 12.5 MG HALF TABLET
12.5000 mg | ORAL_TABLET | Freq: Once | ORAL | Status: AC
  Administered 2018-12-19: 12.5 mg via ORAL
  Filled 2018-12-18: qty 1

## 2018-12-18 MED ORDER — EPINEPHRINE PF 1 MG/ML IJ SOLN
0.0000 ug/min | INTRAVENOUS | Status: DC
  Filled 2018-12-18: qty 4

## 2018-12-18 MED ORDER — PHENYLEPHRINE HCL-NACL 20-0.9 MG/250ML-% IV SOLN
30.0000 ug/min | INTRAVENOUS | Status: DC
  Filled 2018-12-18: qty 250

## 2018-12-18 MED ORDER — NITROGLYCERIN IN D5W 200-5 MCG/ML-% IV SOLN
2.0000 ug/min | INTRAVENOUS | Status: DC
  Filled 2018-12-18: qty 250

## 2018-12-18 MED ORDER — SODIUM CHLORIDE 0.9 % IV SOLN
1.5000 g | INTRAVENOUS | Status: AC
  Administered 2018-12-19: 1.5 g via INTRAVENOUS
  Filled 2018-12-18: qty 1.5

## 2018-12-18 MED ORDER — MAGNESIUM SULFATE 50 % IJ SOLN
40.0000 meq | INTRAMUSCULAR | Status: DC
  Filled 2018-12-18: qty 9.85

## 2018-12-18 MED ORDER — TRANEXAMIC ACID 1000 MG/10ML IV SOLN
1.5000 mg/kg/h | INTRAVENOUS | Status: AC
  Administered 2018-12-19: 1.5 mg/kg/h via INTRAVENOUS
  Filled 2018-12-18: qty 25

## 2018-12-18 MED ORDER — CHLORHEXIDINE GLUCONATE 4 % EX LIQD
60.0000 mL | Freq: Once | CUTANEOUS | Status: AC
  Administered 2018-12-19: 4 via TOPICAL
  Filled 2018-12-18: qty 60

## 2018-12-18 MED ORDER — BISACODYL 5 MG PO TBEC
5.0000 mg | DELAYED_RELEASE_TABLET | Freq: Once | ORAL | Status: DC
  Filled 2018-12-18: qty 1

## 2018-12-18 MED ORDER — DOPAMINE-DEXTROSE 3.2-5 MG/ML-% IV SOLN
0.0000 ug/kg/min | INTRAVENOUS | Status: AC
  Administered 2018-12-19: 2 ug/kg/min via INTRAVENOUS
  Filled 2018-12-18: qty 250

## 2018-12-18 MED ORDER — MILRINONE LACTATE IN DEXTROSE 20-5 MG/100ML-% IV SOLN
0.3000 ug/kg/min | INTRAVENOUS | Status: AC
  Administered 2018-12-19: .25 ug/kg/min via INTRAVENOUS
  Filled 2018-12-18: qty 100

## 2018-12-18 MED ORDER — DIAZEPAM 2 MG PO TABS
2.0000 mg | ORAL_TABLET | Freq: Once | ORAL | Status: AC
  Administered 2018-12-19: 2 mg via ORAL
  Filled 2018-12-18: qty 1

## 2018-12-18 MED ORDER — DEXMEDETOMIDINE HCL IN NACL 400 MCG/100ML IV SOLN
0.1000 ug/kg/h | INTRAVENOUS | Status: AC
  Administered 2018-12-19: .5 ug/kg/h via INTRAVENOUS
  Filled 2018-12-18: qty 100

## 2018-12-18 NOTE — Progress Notes (Signed)
.   Progress Note  Patient Name: Melissa Gonzales Date of Encounter: 12/18/2018  Primary Cardiologist: Chilton Si, MD   Subjective   No significant overnight events. Patient feeling less anxious today. She had a couple brief episodes of shortness of breath last night but none this morning. No chest pain, palpitations, lightheadedness, or dizziness.   Inpatient Medications    Scheduled Meds: . aspirin  81 mg Oral Daily  . atorvastatin  80 mg Oral q1800  . brimonidine  1 drop Both Eyes Q12H  . carvedilol  6.25 mg Oral BID WC  . dorzolamide-timolol  1 drop Both Eyes BID  . enoxaparin (LOVENOX) injection  40 mg Subcutaneous Q24H  . ezetimibe  10 mg Oral Daily  . [START ON 12/19/2018] heparin-papaverine-plasmalyte irrigation   Irrigation To OR  . [START ON 12/19/2018] insulin   Intravenous To OR  . latanoprost  1 drop Both Eyes QHS  . levothyroxine  25 mcg Oral Q0600  . lisinopril  20 mg Oral Daily  . [START ON 12/19/2018] magnesium sulfate  40 mEq Other To OR  . multivitamin with minerals  1 tablet Oral Daily  . nitrofurantoin  100 mg Oral Daily  . pantoprazole  40 mg Oral Daily  . pentosan polysulfate  100 mg Oral TID  . [START ON 12/19/2018] phenylephrine  30-200 mcg/min Intravenous To OR  . [START ON 12/19/2018] potassium chloride  80 mEq Other To OR  . sodium chloride flush  3 mL Intravenous Q12H  . thiamine  100 mg Oral Daily  . [START ON 12/19/2018] tranexamic acid  15 mg/kg Intravenous To OR  . [START ON 12/19/2018] tranexamic acid  2 mg/kg Intracatheter To OR   Continuous Infusions: . sodium chloride    . [START ON 12/19/2018] cefUROXime (ZINACEF)  IV    . [START ON 12/19/2018] cefUROXime (ZINACEF)  IV    . [START ON 12/19/2018] dexmedetomidine    . [START ON 12/19/2018] DOPamine    . [START ON 12/19/2018] epinephrine    . [START ON 12/19/2018] heparin 30,000 units/NS 1000 mL solution for CELLSAVER    . [START ON 12/19/2018] milrinone    . [START ON 12/19/2018]  nitroGLYCERIN    . [START ON 12/19/2018] norepinephrine    . [START ON 12/19/2018] tranexamic acid (CYKLOKAPRON) infusion ([facility])    . [START ON 12/19/2018] vancomycin     PRN Meds: sodium chloride, acetaminophen, ALPRAZolam, diazepam, HYDROcodone-acetaminophen, ondansetron (ZOFRAN) IV, ondansetron **OR** [DISCONTINUED] ondansetron (ZOFRAN) IV, sodium chloride flush   Vital Signs    Vitals:   12/17/18 2003 12/17/18 2346 12/18/18 0348 12/18/18 0847  BP: (!) 118/93 (!) 106/59 134/61 (!) 146/79  Pulse: 79 72 70 84  Resp: 17 16 18    Temp: 98 F (36.7 C) 98 F (36.7 C) 98 F (36.7 C)   TempSrc: Oral Oral Oral   SpO2: 99% 100% 96%   Weight:   69.2 kg   Height:        Intake/Output Summary (Last 24 hours) at 12/18/2018 1021 Last data filed at 12/17/2018 1930 Gross per 24 hour  Intake 1044 ml  Output -  Net 1044 ml   Last 3 Weights 12/18/2018 12/17/2018 12/16/2018  Weight (lbs) 152 lb 8 oz 150 lb 8 oz 152 lb 1.6 oz  Weight (kg) 69.174 kg 68.266 kg 68.992 kg      Telemetry    Sinus rhythm with heart rates in the 60's to 90's. - Personally Reviewed  ECG    No  new ECG tracings today. - Personally Reviewed  Physical Exam   GEN: Caucasian female resting comfortably. Alert and in no acute distress.   Neck: Supple. No JVD appreciated.  Cardiac: RRR. No murmurs, rubs, or gallops. Right radial cath site soft with no signs of hematoma. Respiratory: No increased work of breathing. Minimal crackles noted in bilateral bases, improved from yesterday. No wheezes or rhonchi appreciated. GI: Soft, non-distended, and non-tender. Bowel sounds present.  MS: No lower extremity edema. Skin: Warm and dry.  Neuro:  No focal deficits. Psych: Normal affect. Responds appropriately.  Labs    Chemistry Recent Labs  Lab 12/14/18 2222 12/15/18 0259 12/17/18 1014 12/18/18 0749  NA 138 138  --  140  K 3.5 3.5  --  3.7  CL 102 102  --  107  CO2 24 25  --  24  GLUCOSE 106* 108*  --  112*  BUN 14 12   --  14  CREATININE 0.60 0.61 0.59 0.66  CALCIUM 9.2 9.4  --  9.1  PROT  --  6.6  --   --   ALBUMIN  --  3.7  --   --   AST  --  41  --   --   ALT  --  40  --   --   ALKPHOS  --  62  --   --   BILITOT  --  0.9  --   --   GFRNONAA >60 >60 >60 >60  GFRAA >60 >60 >60 >60  ANIONGAP 12 11  --  9     Hematology Recent Labs  Lab 12/15/18 0259 12/15/18 0905 12/17/18 1014 12/18/18 0749  WBC 8.1  --  5.6 5.4  RBC 4.16  --  3.91 4.03  HGB 14.0  --  13.1 13.8  HCT 42.4 42.5 40.4 40.7  MCV 101.9*  --  103.3* 101.0*  MCH 33.7  --  33.5 34.2*  MCHC 33.0  --  32.4 33.9  RDW 12.3  --  12.3 12.5  PLT 223  --  168 192    Cardiac Enzymes Recent Labs  Lab 12/15/18 0018 12/15/18 0535 12/15/18 1247  TROPONINI <0.03 <0.03 <0.03    Recent Labs  Lab 12/14/18 2237  TROPIPOC 0.00     BNPNo results for input(s): BNP, PROBNP in the last 168 hours.   DDimer  Recent Labs  Lab 12/15/18 0018  DDIMER 1.51*     Radiology    Ct Coronary Morph W/cta Cor W/score W/ca W/cm &/or Wo/cm  Addendum Date: 12/16/2018   ADDENDUM REPORT: 12/16/2018 15:29 CLINICAL DATA:  89F with hypertension, hyperlipidemia, GERD and chest pain. EXAM: Cardiac/Coronary  CT TECHNIQUE: The patient was scanned on a [employer] scanner. FINDINGS: A 120 kV prospective scan was triggered in the descending thoracic aorta at 111 HU's. Axial non-contrast 3 mm slices were carried out through the heart. The data set was analyzed on a dedicated work station and scored using the Agatson method. Gantry rotation speed was 250 msecs and collimation was .6 mm. No beta blockade and 0.8 mg of sl NTG was given. The 3D data set was reconstructed in 5% intervals of the 67-82 % of the R-R cycle. Diastolic phases were analyzed on a dedicated work station using MPR, MIP and VRT modes. The patient received 80 cc of contrast. Aorta:  Normal size.  calcifications.  No dissection. Aortic Valve:  Trileaflet.  Mild calcification of the aortic root.  Coronary Arteries:  Normal coronary  origin.  Right dominance. RCA is a large dominant artery.  Diffuse calcification. Left main is a large artery that gives rise to LAD and LCX arteries. There is minimal calcified plaque. LAD is a large vessel.  There is diffuse calcification. LCX is a non-dominant artery that is heavily calcified. Other findings: The degree of coronary stenosis is not interpretable due to mis-timing of the contrast bolus. IMPRESSION: 1. Coronary calcium score of 3220. This was 99th percentile for age and sex matched control. 2. Normal coronary origin with right dominance. 3. The degree of coronary stenosis is not interpretable due to mis-timing of the contrast bolus. 4. Three vessel CAD noted. 5. Given very high coronary calcium score and ischemic symptoms, recommend cardiac catheterization. 6. Recommend aggressive risk factor modification including high potency statin. Chilton Siiffany [place], MD Electronically Signed   By: Chilton Siiffany  [place]   On: 12/16/2018 15:29   Result Date: 12/16/2018 EXAM: OVER-READ INTERPRETATION  CT CHEST The following report is an over-read performed by radiologist Dr. Jeronimo GreavesKyle Talbot of [facility] on 12/16/2018. This over-read does not include interpretation of cardiac or coronary anatomy or pathology. The coronary CTA interpretation by the cardiologist is attached. COMPARISON:  CTA chest of 1 day prior. FINDINGS: Vascular: Aortic atherosclerosis. Extremely early bolus timing, limiting evaluation of the aorta. Pulmonary arteries not opacified secondary to bolus timing. Mediastinum/Nodes: No imaged thoracic adenopathy. Lungs/Pleura: No pleural fluid.  Bibasilar scarring. Upper Abdomen: Normal imaged portions of the liver, spleen, stomach. Musculoskeletal: Moderate midthoracic spondylosis. Superior endplate compression deformity at T11 is mild. IMPRESSION: 1.  No acute findings in the imaged extracardiac chest. 2.  Aortic Atherosclerosis (ICD10-I70.0). Electronically  Signed: By: Jeronimo GreavesKyle  Talbot M.D. On: 12/16/2018 13:42   [place] Doppler Pre Cabg  Result Date: 12/18/2018 PREOPERATIVE VASCULAR EVALUATION  Indications: Pre cabg. Performing Technologist: Blanch MediaMegan Riddle RVS  Examination Guidelines: A complete evaluation includes B-mode imaging, spectral Doppler, color Doppler, and power Doppler as needed of all accessible portions of each vessel. Bilateral testing is considered an integral part of a complete examination. Limited examinations for reoccurring indications may be performed as noted.  Right Carotid Findings: +----------+--------+--------+--------+------------+--------+           PSV cm/sEDV cm/sStenosisDescribe    Comments +----------+--------+--------+--------+------------+--------+ CCA Prox  70      12              heterogenous         +----------+--------+--------+--------+------------+--------+ CCA Distal63      11              heterogenous         +----------+--------+--------+--------+------------+--------+ ICA Prox  55      16      1-39%   heterogenous         +----------+--------+--------+--------+------------+--------+ ICA Distal44      11                                   +----------+--------+--------+--------+------------+--------+ ECA       63                                           +----------+--------+--------+--------+------------+--------+ +----------+--------+-------+--------+------------+           PSV cm/sEDV cmsDescribeArm Pressure +----------+--------+-------+--------+------------+ [id number]  154          +----------+--------+-------+--------+------------+ +---------+--------+--+--------+--+---------+ VertebralPSV cm/s47EDV cm/s14Antegrade +---------+--------+--+--------+--+---------+ Left Carotid Findings: +----------+--------+--------+--------+------------+--------+           PSV cm/sEDV cm/sStenosisDescribe    Comments  +----------+--------+--------+--------+------------+--------+ CCA Prox  98      12              heterogenous         +----------+--------+--------+--------+------------+--------+ CCA Distal79      15              heterogenous         +----------+--------+--------+--------+------------+--------+ ICA Prox  56      16      1-39%   heterogenous         +----------+--------+--------+--------+------------+--------+ ICA Distal62      17                                   +----------+--------+--------+--------+------------+--------+ ECA       72                                           +----------+--------+--------+--------+------------+--------+ +----------+--------+--------+--------+------------+ SubclavianPSV cm/sEDV cm/sDescribeArm Pressure +----------+--------+--------+--------+------------+           90                      148          +----------+--------+--------+--------+------------+ +---------+--------+--+--------+-+---------+ VertebralPSV cm/s46EDV cm/s8Antegrade +---------+--------+--+--------+-+---------+  ABI Findings: +--------+------------------+-----+---------+--------+ Right   Rt Pressure (mmHg)IndexWaveform Comment  +--------+------------------+-----+---------+--------+ [id number]                    triphasic         +--------+------------------+-----+---------+--------+ PTA                            triphasic         +--------+------------------+-----+---------+--------+ DP                             triphasic         +--------+------------------+-----+---------+--------+ +--------+------------------+-----+---------+-------+ Left    Lt Pressure (mmHg)IndexWaveform Comment +--------+------------------+-----+---------+-------+ [id number]                    triphasic        +--------+------------------+-----+---------+-------+ PTA                            triphasic         +--------+------------------+-----+---------+-------+ DP                             triphasic        +--------+------------------+-----+---------+-------+  Right Doppler Findings: +--------+--------+-----+---------+--------+ Site    PressureIndexDoppler  Comments +--------+--------+-----+---------+--------+ [id number]          triphasic         +--------+--------+-----+---------+--------+ Radial               triphasic         +--------+--------+-----+---------+--------+ Ulnar                triphasic         +--------+--------+-----+---------+--------+  Left  Doppler Findings: +--------+--------+-----+---------+--------+ Site    PressureIndexDoppler  Comments +--------+--------+-----+---------+--------+ [id number]          triphasic         +--------+--------+-----+---------+--------+ Radial               triphasic         +--------+--------+-----+---------+--------+ Ulnar                triphasic         +--------+--------+-----+---------+--------+  Summary: Right Carotid: Velocities in the right ICA are consistent with a 1-39% stenosis. Left Carotid: Velocities in the left ICA are consistent with a 1-39% stenosis. Vertebrals: Bilateral vertebral arteries demonstrate antegrade flow. Right Upper Extremity: Doppler waveforms remain within normal limits with right radial compression. Doppler waveforms decrease 50% with right ulnar compression. Left Upper Extremity: Doppler waveforms remain within normal limits with left radial compression. Doppler waveforms decrease 50% with left ulnar compression.     Preliminary     Cardiac Studies   Echocardiogram 12/15/2018:  1. The left ventricle has hyperdynamic systolic function of >65%. The cavity size was normal. There is mildly increased left ventricular wall thickness. Echo evidence of impaired diastolic relaxation Elevated left ventricular end-diastolic pressure The  E/e' is 15.6.  2. The right ventricle has  normal systolic function. The cavity was normal. There is no increase in right ventricular wall thickness.  3. The mitral valve is normal in structure. There is mild mitral annular calcification present.  4. The tricuspid valve is normal in structure.Tricuspid valve regurgitation is mild by color flow Doppler.  5. The aortic valve is tricuspid Aortic valve regurgitation is moderate by color flow Doppler.  6. The pulmonic valve was normal in structure. _______________  Left Heart Catheterization 12/17/2018:  Ost RCA to Mid RCA lesion is 100% stenosed.  Prox Cx lesion is 40% stenosed.  Mid Cx to Dist Cx lesion is 85% stenosed.  RPDA lesion is 95% stenosed.  Ost RPDA to RPDA lesion is 90% stenosed.  Dist LM to Ost LAD lesion is 35% stenosed.  Prox LAD lesion is 70% stenosed.  Prox LAD to Mid LAD lesion is 90% stenosed.   Severe extensive multivessel coronary calcification with significant three-vessel coronary obstructive disease.  The LAD has been 70% proximal stenoses and is a twin like vessel with 90% diffuse mid LAD stenosis.  The distal LAD does not reach the apex.  The circumflex vessel is very large caliber with extensive calcification with 40% proximal stenosis and 85% AV groove stenosis after the second marginal vessel and there is extensive collateralization to the right coronary artery via the circumflex vessel with 95 to 90% stenoses in the collateralized PDA vessel.  The RCA is extensively calcified and occluded at its ostium.  LVEDP 15 mm Hg.  Recommendations: Surgical consult for CABG revascularization.  Aggressive lipid-lowering therapy and optimal blood pressure to less than 130/80.  Patient Profile   Ms. Lozo is a 78 y.o. female with a history of bladder cancer, habitual wine intake, interstitial cystitis, glaucoma, anxiety, GERD, hypertension, hyperlipidemia, arthritis who is being seen today for the evaluation of palpitations, dyspnea, chest discomfort at the  request of Dr. Katrinka Blazing  Assessment & Plan    Chest Pain - Patient presented with chest discomfort, dyspnea, palpitations, and fatigue.  - EKG showed normal sinus rhythm with PVCs. - Troponin x3.  - Patient is currently chest pain free.  - Continue aspirin, beta blocker, and statin.  - Coronary CT showed coronary calcium score  of 3220 (99th percentile fore age) with three vessel CAD. Degree of coronary stenosis no interpretable due to mis-timing of the contrast bolus. - Left heart catheterization today showed severe extensive multivessel CAD. CT surgery has been consulted and CABG is planned for tomorrow.  Hypertension - Most recent BP 146/79. - Continue Coreg.  Hyperlipidemia - Lipid panel this admission: Cholesterol 177, Triglycerides 116, HDL 57, LDL 97. - LDL goal <70 given CAD. - Continue Lipitor 80mg  daily. - Continue Zetia 10mg  daily (added on 12/16/2018).   For questions or updates, please contact [facility] Please consult [email or web address] for contact info under        Signed, Corrin Parker, PA-C  12/18/2018, 10:21 AM

## 2018-12-18 NOTE — Progress Notes (Signed)
ABG collected  

## 2018-12-18 NOTE — Progress Notes (Signed)
Pre cabg has been completed.   Preliminary results in CV Proc.   Blanch MediaMegan Roy Tokarz 12/18/2018 9:55 AM

## 2018-12-18 NOTE — Care Management Note (Signed)
Case Management Note  Patient Details  Name: Quitman LivingsCarol A Macrae MRN: [id number] Date of Birth: 11/19/1940  Subjective/Objective: Pt presented for chest pain. Post cath showed severe multivessel CAD. Plan for CABG 12-19-18. PTA from home alone. Patient has support of daughter that lives near her. Pt will have daughter that lives out of town staying with her post surgery. Pt uses DME [facility].                 Action/Plan: CM will continue to monitor for transition of care needs.   Expected Discharge Date:                  Expected Discharge Plan:  Home w Home Health Services  In-House Referral:  NA  Discharge planning Services  CM Consult  Post Acute Care Choice:    Choice offered to:     DME Arranged:    DME Agency:     HH Arranged:    HH Agency:     Status of Service:  In process, will continue to follow  If discussed at Long Length of Stay Meetings, dates discussed:    Additional Comments:  Gala LewandowskyGraves-Bigelow, Joyous Gleghorn Kaye, RN 12/18/2018, 10:52 AM

## 2018-12-18 NOTE — Progress Notes (Signed)
CARDIAC REHAB PHASE I   Preop education completed with pt. Pt given Cardiac Surgery booklet, [facility] care guide, and in-the-tube sheet. Reviewed importace of IS, walks, and sternal precautions. Pt denies any questions or concerns, and states she is "at peace". Will continue to follow throughout hospital stay.  [id number] Reynold Bowen, RN BSN 12/18/2018 2:15 PM

## 2018-12-19 ENCOUNTER — Inpatient Hospital Stay (HOSPITAL_COMMUNITY): Payer: Medicare Other | Admitting: Certified Registered Nurse Anesthetist

## 2018-12-19 ENCOUNTER — Inpatient Hospital Stay (HOSPITAL_COMMUNITY): Payer: Medicare Other

## 2018-12-19 ENCOUNTER — Encounter (HOSPITAL_COMMUNITY)
Admission: EM | Disposition: A | Discharge status: Skilled Nursing Facility | Payer: Self-pay | Source: Home / Self Care | Attending: Cardiothoracic Surgery

## 2018-12-19 DIAGNOSIS — Z951 Presence of aortocoronary bypass graft: Secondary | ICD-10-CM

## 2018-12-19 HISTORY — PX: CORONARY ARTERY BYPASS GRAFT: SHX141

## 2018-12-19 HISTORY — PX: TEE WITHOUT CARDIOVERSION: SHX5443

## 2018-12-19 LAB — POCT I-STAT 4, (NA,K, GLUC, HGB,HCT)
Glucose, Bld: 96 mg/dL (ref 70–99)
Glucose, Bld: 109 mg/dL — ABNORMAL HIGH (ref 70–99)
Glucose, Bld: 96 mg/dL (ref 70–99)
Glucose, Bld: 145 mg/dL — ABNORMAL HIGH (ref 70–99)
Glucose, Bld: 145 mg/dL — ABNORMAL HIGH (ref 70–99)
Glucose, Bld: 153 mg/dL — ABNORMAL HIGH (ref 70–99)
Glucose, Bld: 167 mg/dL — ABNORMAL HIGH (ref 70–99)
Glucose, Bld: 145 mg/dL — ABNORMAL HIGH (ref 70–99)
Glucose, Bld: 168 mg/dL — ABNORMAL HIGH (ref 70–99)
Glucose, Bld: 104 mg/dL — ABNORMAL HIGH (ref 70–99)
HCT: 31 % — ABNORMAL LOW (ref 36.0–46.0)
HCT: 31 % — ABNORMAL LOW (ref 36.0–46.0)
HCT: 25 % — ABNORMAL LOW (ref 36.0–46.0)
HCT: 21 % — ABNORMAL LOW (ref 36.0–46.0)
HCT: 21 % — ABNORMAL LOW (ref 36.0–46.0)
HCT: 23 % — ABNORMAL LOW (ref 36.0–46.0)
HCT: 23 % — ABNORMAL LOW (ref 36.0–46.0)
HCT: 24 % — ABNORMAL LOW (ref 36.0–46.0)
HCT: 21 % — ABNORMAL LOW (ref 36.0–46.0)
HCT: 25 % — ABNORMAL LOW (ref 36.0–46.0)
HEMOGLOBIN: 7.1 g/dL — AB (ref 12.0–15.0)
Hemoglobin: 10.5 g/dL — ABNORMAL LOW (ref 12.0–15.0)
Hemoglobin: 10.5 g/dL — ABNORMAL LOW (ref 12.0–15.0)
Hemoglobin: 8.5 g/dL — ABNORMAL LOW (ref 12.0–15.0)
Hemoglobin: 7.1 g/dL — ABNORMAL LOW (ref 12.0–15.0)
Hemoglobin: 7.8 g/dL — ABNORMAL LOW (ref 12.0–15.0)
Hemoglobin: 7.8 g/dL — ABNORMAL LOW (ref 12.0–15.0)
Hemoglobin: 8.2 g/dL — ABNORMAL LOW (ref 12.0–15.0)
Hemoglobin: 7.1 g/dL — ABNORMAL LOW (ref 12.0–15.0)
Hemoglobin: 8.5 g/dL — ABNORMAL LOW (ref 12.0–15.0)
POTASSIUM: 4 mmol/L (ref 3.5–5.1)
Potassium: 3.7 mmol/L (ref 3.5–5.1)
Potassium: 4.3 mmol/L (ref 3.5–5.1)
Potassium: 4.7 mmol/L (ref 3.5–5.1)
Potassium: 4.8 mmol/L (ref 3.5–5.1)
Potassium: 4.4 mmol/L (ref 3.5–5.1)
Potassium: 4.8 mmol/L (ref 3.5–5.1)
Potassium: 4.1 mmol/L (ref 3.5–5.1)
Potassium: 3.4 mmol/L — ABNORMAL LOW (ref 3.5–5.1)
Potassium: 3.4 mmol/L — ABNORMAL LOW (ref 3.5–5.1)
SODIUM: 139 mmol/L (ref 135–145)
Sodium: 143 mmol/L (ref 135–145)
Sodium: 142 mmol/L (ref 135–145)
Sodium: 142 mmol/L (ref 135–145)
Sodium: 140 mmol/L (ref 135–145)
Sodium: 140 mmol/L (ref 135–145)
Sodium: 141 mmol/L (ref 135–145)
Sodium: 141 mmol/L (ref 135–145)
Sodium: 142 mmol/L (ref 135–145)
Sodium: 144 mmol/L (ref 135–145)

## 2018-12-19 LAB — POCT I-STAT 7, (LYTES, BLD GAS, ICA,H+H)
Acid-base deficit: 1 mmol/L (ref 0.0–2.0)
Acid-base deficit: 2 mmol/L (ref 0.0–2.0)
Acid-base deficit: 5 mmol/L — ABNORMAL HIGH (ref 0.0–2.0)
Acid-base deficit: 1 mmol/L (ref 0.0–2.0)
Acid-base deficit: 2 mmol/L (ref 0.0–2.0)
Acid-base deficit: 1 mmol/L (ref 0.0–2.0)
BICARBONATE: 25 mmol/L (ref 20.0–28.0)
Bicarbonate: 24.6 mmol/L (ref 20.0–28.0)
Bicarbonate: 21 mmol/L (ref 20.0–28.0)
Bicarbonate: 22.7 mmol/L (ref 20.0–28.0)
Bicarbonate: 25.5 mmol/L (ref 20.0–28.0)
Bicarbonate: 22.6 mmol/L (ref 20.0–28.0)
Bicarbonate: 23.5 mmol/L (ref 20.0–28.0)
CALCIUM ION: 0.96 mmol/L — AB (ref 1.15–1.40)
Calcium, Ion: 1.03 mmol/L — ABNORMAL LOW (ref 1.15–1.40)
Calcium, Ion: 1.12 mmol/L — ABNORMAL LOW (ref 1.15–1.40)
Calcium, Ion: 1.42 mmol/L — ABNORMAL HIGH (ref 1.15–1.40)
Calcium, Ion: 1.21 mmol/L (ref 1.15–1.40)
Calcium, Ion: 1.22 mmol/L (ref 1.15–1.40)
Calcium, Ion: 1.26 mmol/L (ref 1.15–1.40)
HCT: 23 % — ABNORMAL LOW (ref 36.0–46.0)
HCT: 21 % — ABNORMAL LOW (ref 36.0–46.0)
HCT: 21 % — ABNORMAL LOW (ref 36.0–46.0)
HCT: 24 % — ABNORMAL LOW (ref 36.0–46.0)
HCT: 21 % — ABNORMAL LOW (ref 36.0–46.0)
HEMATOCRIT: 25 % — AB (ref 36.0–46.0)
HEMATOCRIT: 19 % — AB (ref 36.0–46.0)
Hemoglobin: 7.8 g/dL — ABNORMAL LOW (ref 12.0–15.0)
Hemoglobin: 7.1 g/dL — ABNORMAL LOW (ref 12.0–15.0)
Hemoglobin: 7.1 g/dL — ABNORMAL LOW (ref 12.0–15.0)
Hemoglobin: 8.5 g/dL — ABNORMAL LOW (ref 12.0–15.0)
Hemoglobin: 8.2 g/dL — ABNORMAL LOW (ref 12.0–15.0)
Hemoglobin: 6.5 g/dL — CL (ref 12.0–15.0)
Hemoglobin: 7.1 g/dL — ABNORMAL LOW (ref 12.0–15.0)
O2 SAT: 95 %
O2 Saturation: 100 %
O2 Saturation: 100 %
O2 Saturation: 94 %
O2 Saturation: 96 %
O2 Saturation: 96 %
O2 Saturation: 100 %
POTASSIUM: 3 mmol/L — AB (ref 3.5–5.1)
POTASSIUM: 3.5 mmol/L (ref 3.5–5.1)
Patient temperature: 35.6
Patient temperature: 37
Potassium: 4.3 mmol/L (ref 3.5–5.1)
Potassium: 4.7 mmol/L (ref 3.5–5.1)
Potassium: 3.4 mmol/L — ABNORMAL LOW (ref 3.5–5.1)
Potassium: 3.7 mmol/L (ref 3.5–5.1)
Potassium: 3.6 mmol/L (ref 3.5–5.1)
SODIUM: 144 mmol/L (ref 135–145)
Sodium: 142 mmol/L (ref 135–145)
Sodium: 140 mmol/L (ref 135–145)
Sodium: 141 mmol/L (ref 135–145)
Sodium: 143 mmol/L (ref 135–145)
Sodium: 142 mmol/L (ref 135–145)
Sodium: 143 mmol/L (ref 135–145)
TCO2: 26 mmol/L (ref 22–32)
TCO2: 26 mmol/L (ref 22–32)
TCO2: 22 mmol/L (ref 22–32)
TCO2: 24 mmol/L (ref 22–32)
TCO2: 27 mmol/L (ref 22–32)
TCO2: 24 mmol/L (ref 22–32)
TCO2: 25 mmol/L (ref 22–32)
pCO2 arterial: 40 mmHg (ref 32.0–48.0)
pCO2 arterial: 43.4 mmHg (ref 32.0–48.0)
pCO2 arterial: 36.3 mmHg (ref 32.0–48.0)
pCO2 arterial: 43.4 mmHg (ref 32.0–48.0)
pCO2 arterial: 48.9 mmHg — ABNORMAL HIGH (ref 32.0–48.0)
pCO2 arterial: 36.6 mmHg (ref 32.0–48.0)
pCO2 arterial: 39.1 mmHg (ref 32.0–48.0)
pH, Arterial: 7.404 (ref 7.350–7.450)
pH, Arterial: 7.362 (ref 7.350–7.450)
pH, Arterial: 7.293 — ABNORMAL LOW (ref 7.350–7.450)
pH, Arterial: 7.405 (ref 7.350–7.450)
pH, Arterial: 7.319 — ABNORMAL LOW (ref 7.350–7.450)
pH, Arterial: 7.398 (ref 7.350–7.450)
pH, Arterial: 7.388 (ref 7.350–7.450)
pO2, Arterial: 455 mmHg — ABNORMAL HIGH (ref 83.0–108.0)
pO2, Arterial: 373 mmHg — ABNORMAL HIGH (ref 83.0–108.0)
pO2, Arterial: 81 mmHg — ABNORMAL LOW (ref 83.0–108.0)
pO2, Arterial: 83 mmHg (ref 83.0–108.0)
pO2, Arterial: 80 mmHg — ABNORMAL LOW (ref 83.0–108.0)
pO2, Arterial: 82 mmHg — ABNORMAL LOW (ref 83.0–108.0)
pO2, Arterial: 193 mmHg — ABNORMAL HIGH (ref 83.0–108.0)

## 2018-12-19 LAB — BASIC METABOLIC PANEL
Anion gap: 9 (ref 5–15)
Anion gap: 10 (ref 5–15)
BUN: 13 mg/dL (ref 8–23)
BUN: 9 mg/dL (ref 8–23)
CO2: 27 mmol/L (ref 22–32)
CO2: 22 mmol/L (ref 22–32)
Calcium: 9.8 mg/dL (ref 8.9–10.3)
Calcium: 8.5 mg/dL — ABNORMAL LOW (ref 8.9–10.3)
Chloride: 107 mmol/L (ref 98–111)
Chloride: 109 mmol/L (ref 98–111)
Creatinine, Ser: 0.74 mg/dL (ref 0.44–1.00)
Creatinine, Ser: 0.51 mg/dL (ref 0.44–1.00)
GFR calc Af Amer: >60 mL/min (ref >60)
GFR calc Af Amer: >60 mL/min (ref >60)
GFR calc non Af Amer: >60 mL/min (ref >60)
GFR calc non Af Amer: >60 mL/min (ref >60)
Glucose, Bld: 110 mg/dL — ABNORMAL HIGH (ref 70–99)
Glucose, Bld: 112 mg/dL — ABNORMAL HIGH (ref 70–99)
Potassium: 4.9 mmol/L (ref 3.5–5.1)
Potassium: 3.4 mmol/L — ABNORMAL LOW (ref 3.5–5.1)
Sodium: 143 mmol/L (ref 135–145)
Sodium: 141 mmol/L (ref 135–145)

## 2018-12-19 LAB — CBC
HCT: 41.4 % (ref 36.0–46.0)
HCT: 27.6 % — ABNORMAL LOW (ref 36.0–46.0)
HCT: 23.4 % — ABNORMAL LOW (ref 36.0–46.0)
Hemoglobin: 13.7 g/dL (ref 12.0–15.0)
Hemoglobin: 9.3 g/dL — ABNORMAL LOW (ref 12.0–15.0)
Hemoglobin: 7.9 g/dL — ABNORMAL LOW (ref 12.0–15.0)
MCH: 34 pg (ref 26.0–34.0)
MCH: 33.3 pg (ref 26.0–34.0)
MCH: 33.1 pg (ref 26.0–34.0)
MCHC: 33.1 g/dL (ref 30.0–36.0)
MCHC: 33.7 g/dL (ref 30.0–36.0)
MCHC: 33.8 g/dL (ref 30.0–36.0)
MCV: 102.7 fL — ABNORMAL HIGH (ref 80.0–100.0)
MCV: 98.9 fL (ref 80.0–100.0)
MCV: 97.9 fL (ref 80.0–100.0)
Platelets: 193 10*3/uL (ref 150–400)
Platelets: 99 10*3/uL — ABNORMAL LOW (ref 150–400)
Platelets: 103 10*3/uL — ABNORMAL LOW (ref 150–400)
RBC: 4.03 MIL/uL (ref 3.87–5.11)
RBC: 2.79 MIL/uL — ABNORMAL LOW (ref 3.87–5.11)
RBC: 2.39 MIL/uL — ABNORMAL LOW (ref 3.87–5.11)
RDW: 12.4 % (ref 11.5–15.5)
RDW: 15 % (ref 11.5–15.5)
RDW: 15.6 % — ABNORMAL HIGH (ref 11.5–15.5)
WBC: 5.6 10*3/uL (ref 4.0–10.5)
WBC: 8 10*3/uL (ref 4.0–10.5)
WBC: 5.9 10*3/uL (ref 4.0–10.5)
nRBC: 0 % (ref 0.0–0.2)
nRBC: 0 % (ref 0.0–0.2)
nRBC: 0 % (ref 0.0–0.2)

## 2018-12-19 LAB — GLUCOSE, CAPILLARY
GLUCOSE-CAPILLARY: 105 mg/dL — AB (ref 70–99)
GLUCOSE-CAPILLARY: 112 mg/dL — AB (ref 70–99)
Glucose-Capillary: 110 mg/dL — ABNORMAL HIGH (ref 70–99)
Glucose-Capillary: 105 mg/dL — ABNORMAL HIGH (ref 70–99)
Glucose-Capillary: 106 mg/dL — ABNORMAL HIGH (ref 70–99)
Glucose-Capillary: 130 mg/dL — ABNORMAL HIGH (ref 70–99)
Glucose-Capillary: 118 mg/dL — ABNORMAL HIGH (ref 70–99)

## 2018-12-19 LAB — PROTIME-INR
INR: 1.48
Prothrombin Time: 17.8 seconds — ABNORMAL HIGH (ref 11.4–15.2)

## 2018-12-19 LAB — HEMOGLOBIN AND HEMATOCRIT, BLOOD
HCT: 25.4 % — ABNORMAL LOW (ref 36.0–46.0)
Hemoglobin: 8.3 g/dL — ABNORMAL LOW (ref 12.0–15.0)

## 2018-12-19 LAB — APTT: aPTT: 32 seconds (ref 24–36)

## 2018-12-19 LAB — MAGNESIUM: Magnesium: 2.7 mg/dL — ABNORMAL HIGH (ref 1.7–2.4)

## 2018-12-19 LAB — PREPARE RBC (CROSSMATCH)

## 2018-12-19 LAB — PLATELET COUNT: Platelets: 103 10*3/uL — ABNORMAL LOW (ref 150–400)

## 2018-12-19 SURGERY — CORONARY ARTERY BYPASS GRAFTING (CABG)
Anesthesia: General | Site: Chest

## 2018-12-19 MED ORDER — SODIUM CHLORIDE 0.9% FLUSH: 3.0000 mL | INTRAVENOUS | Status: DC | PRN

## 2018-12-19 MED ORDER — EPINEPHRINE PF 1 MG/10ML IJ SOSY
PREFILLED_SYRINGE | INTRAMUSCULAR | Status: DC | PRN
  Administered 2018-12-19: 100 ug via INTRAVENOUS

## 2018-12-19 MED ORDER — DORZOLAMIDE HCL-TIMOLOL MAL 2-0.5 % OP SOLN
1.0000 [drp] | Freq: Two times a day (BID) | OPHTHALMIC | Status: DC
  Administered 2018-12-20 – 2018-12-28 (×17): 1 [drp] via OPHTHALMIC
  Filled 2018-12-19: qty 10

## 2018-12-19 MED ORDER — SODIUM CHLORIDE (PF) 0.9 % IJ SOLN
OROMUCOSAL | Status: DC | PRN
  Administered 2018-12-19 (×3): 1 mL via TOPICAL

## 2018-12-19 MED ORDER — MAGNESIUM SULFATE 4 GM/100ML IV SOLN
4.0000 g | Freq: Once | INTRAVENOUS | Status: AC
  Administered 2018-12-19: 4 g via INTRAVENOUS
  Filled 2018-12-19: qty 100

## 2018-12-19 MED ORDER — MIDAZOLAM HCL (PF) 10 MG/2ML IJ SOLN
INTRAMUSCULAR | Status: AC
  Filled 2018-12-19: qty 2

## 2018-12-19 MED ORDER — PROTAMINE SULFATE 10 MG/ML IV SOLN
INTRAVENOUS | Status: AC
  Filled 2018-12-19: qty 25

## 2018-12-19 MED ORDER — ACETAMINOPHEN 650 MG RE SUPP
650.0000 mg | Freq: Once | RECTAL | Status: AC
  Administered 2018-12-19: 650 mg via RECTAL

## 2018-12-19 MED ORDER — PROTAMINE SULFATE 10 MG/ML IV SOLN
INTRAVENOUS | Status: DC | PRN
  Administered 2018-12-19: 230 mg via INTRAVENOUS
  Administered 2018-12-19: 10 mg via INTRAVENOUS

## 2018-12-19 MED ORDER — ATORVASTATIN CALCIUM 80 MG PO TABS
80.0000 mg | ORAL_TABLET | Freq: Every day | ORAL | Status: DC
  Administered 2018-12-20 – 2018-12-27 (×8): 80 mg via ORAL
  Filled 2018-12-19 (×8): qty 1

## 2018-12-19 MED ORDER — EZETIMIBE 10 MG PO TABS
10.0000 mg | ORAL_TABLET | Freq: Every day | ORAL | Status: DC
  Administered 2018-12-20 – 2018-12-26 (×7): 10 mg via ORAL
  Filled 2018-12-19 (×7): qty 1

## 2018-12-19 MED ORDER — BISACODYL 5 MG PO TBEC
10.0000 mg | DELAYED_RELEASE_TABLET | Freq: Every day | ORAL | Status: DC
  Administered 2018-12-20 – 2018-12-22 (×3): 10 mg via ORAL
  Filled 2018-12-19 (×6): qty 2

## 2018-12-19 MED ORDER — INSULIN REGULAR(HUMAN) IN NACL 100-0.9 UT/100ML-% IV SOLN: INTRAVENOUS | Status: DC

## 2018-12-19 MED ORDER — FENTANYL CITRATE (PF) 100 MCG/2ML IJ SOLN
25.0000 ug | INTRAMUSCULAR | Status: DC | PRN
  Administered 2018-12-19 (×2): 25 ug via INTRAVENOUS
  Filled 2018-12-19 (×2): qty 2

## 2018-12-19 MED ORDER — SODIUM CHLORIDE 0.9 % IV SOLN
INTRAVENOUS | Status: DC | PRN
  Administered 2018-12-19: 14:00:00 via INTRAVENOUS

## 2018-12-19 MED ORDER — NITROGLYCERIN IN D5W 200-5 MCG/ML-% IV SOLN: 0.0000 ug/min | INTRAVENOUS | Status: DC

## 2018-12-19 MED ORDER — PHENYLEPHRINE HCL 10 MG/ML IJ SOLN
INTRAMUSCULAR | Status: AC
  Filled 2018-12-19: qty 2

## 2018-12-19 MED ORDER — FENTANYL CITRATE (PF) 100 MCG/2ML IJ SOLN
50.0000 ug | INTRAMUSCULAR | Status: DC | PRN
  Administered 2018-12-19 – 2018-12-20 (×8): 50 ug via INTRAVENOUS
  Filled 2018-12-19 (×8): qty 2

## 2018-12-19 MED ORDER — CALCIUM CHLORIDE 10 % IV SOLN
1.0000 g | Freq: Once | INTRAVENOUS | Status: AC
  Administered 2018-12-19: 1 g via INTRAVENOUS

## 2018-12-19 MED ORDER — LACTATED RINGERS IV SOLN: INTRAVENOUS | Status: DC

## 2018-12-19 MED ORDER — POTASSIUM CHLORIDE 10 MEQ/50ML IV SOLN
10.0000 meq | INTRAVENOUS | Status: AC | PRN
  Administered 2018-12-19 – 2018-12-20 (×3): 10 meq via INTRAVENOUS

## 2018-12-19 MED ORDER — HEPARIN SODIUM (PORCINE) 1000 UNIT/ML IJ SOLN
INTRAMUSCULAR | Status: AC
  Filled 2018-12-19: qty 1

## 2018-12-19 MED ORDER — METOPROLOL TARTRATE 25 MG/10 ML ORAL SUSPENSION: 12.5000 mg | Freq: Two times a day (BID) | ORAL | Status: DC

## 2018-12-19 MED ORDER — PROPOFOL 10 MG/ML IV BOLUS
INTRAVENOUS | Status: AC
  Filled 2018-12-19: qty 20

## 2018-12-19 MED ORDER — LACTATED RINGERS IV SOLN
INTRAVENOUS | Status: DC | PRN
  Administered 2018-12-19 (×2): via INTRAVENOUS

## 2018-12-19 MED ORDER — TRAMADOL HCL 50 MG PO TABS
50.0000 mg | ORAL_TABLET | ORAL | Status: DC | PRN
  Administered 2018-12-20 – 2018-12-28 (×12): 100 mg via ORAL
  Filled 2018-12-19 (×12): qty 2

## 2018-12-19 MED ORDER — 0.9 % SODIUM CHLORIDE (POUR BTL) OPTIME
TOPICAL | Status: DC | PRN
  Administered 2018-12-19: 6000 mL

## 2018-12-19 MED ORDER — METOPROLOL TARTRATE 5 MG/5ML IV SOLN: 2.5000 mg | INTRAVENOUS | Status: DC | PRN

## 2018-12-19 MED ORDER — MIDAZOLAM HCL 5 MG/5ML IJ SOLN
INTRAMUSCULAR | Status: DC | PRN
  Administered 2018-12-19: 2 mg via INTRAVENOUS
  Administered 2018-12-19 (×2): 1 mg via INTRAVENOUS
  Administered 2018-12-19: 2 mg via INTRAVENOUS
  Administered 2018-12-19: 3 mg via INTRAVENOUS
  Administered 2018-12-19: 1 mg via INTRAVENOUS

## 2018-12-19 MED ORDER — FAMOTIDINE IN NACL 20-0.9 MG/50ML-% IV SOLN
20.0000 mg | Freq: Two times a day (BID) | INTRAVENOUS | Status: AC
  Administered 2018-12-19 (×2): 20 mg via INTRAVENOUS
  Filled 2018-12-19: qty 50

## 2018-12-19 MED ORDER — CHLORHEXIDINE GLUCONATE CLOTH 2 % EX PADS
6.0000 | MEDICATED_PAD | Freq: Every day | CUTANEOUS | Status: DC
  Administered 2018-12-19 – 2018-12-20 (×2): 6 via TOPICAL

## 2018-12-19 MED ORDER — LACTATED RINGERS IV SOLN: 500.0000 mL | Freq: Once | INTRAVENOUS | Status: DC | PRN

## 2018-12-19 MED ORDER — MIDAZOLAM HCL 2 MG/2ML IJ SOLN: 2.0000 mg | INTRAMUSCULAR | Status: DC | PRN

## 2018-12-19 MED ORDER — ORAL CARE MOUTH RINSE
15.0000 mL | Freq: Two times a day (BID) | OROMUCOSAL | Status: DC
  Administered 2018-12-19 – 2018-12-20 (×2): 15 mL via OROMUCOSAL

## 2018-12-19 MED ORDER — ASPIRIN 81 MG PO CHEW
324.0000 mg | CHEWABLE_TABLET | Freq: Every day | ORAL | Status: DC
  Filled 2018-12-19: qty 4

## 2018-12-19 MED ORDER — SODIUM CHLORIDE 0.9% FLUSH: 10.0000 mL | INTRAVENOUS | Status: DC | PRN

## 2018-12-19 MED ORDER — ASPIRIN EC 325 MG PO TBEC
325.0000 mg | DELAYED_RELEASE_TABLET | Freq: Every day | ORAL | Status: DC
  Administered 2018-12-20 – 2018-12-28 (×9): 325 mg via ORAL
  Filled 2018-12-19 (×9): qty 1

## 2018-12-19 MED ORDER — NITROGLYCERIN 0.2 MG/ML ON CALL CATH LAB
INTRAVENOUS | Status: DC | PRN
  Administered 2018-12-19 (×2): 40 ug via INTRAVENOUS

## 2018-12-19 MED ORDER — SODIUM CHLORIDE 0.9% IV SOLUTION: Freq: Once | INTRAVENOUS | Status: DC

## 2018-12-19 MED ORDER — SODIUM CHLORIDE 0.9% FLUSH
3.0000 mL | Freq: Two times a day (BID) | INTRAVENOUS | Status: DC
  Administered 2018-12-20 – 2018-12-25 (×7): 3 mL via INTRAVENOUS

## 2018-12-19 MED ORDER — SODIUM CHLORIDE 0.45 % IV SOLN
INTRAVENOUS | Status: DC | PRN
  Administered 2018-12-19: 16:00:00 via INTRAVENOUS

## 2018-12-19 MED ORDER — BISACODYL 10 MG RE SUPP: 10.0000 mg | Freq: Every day | RECTAL | Status: DC

## 2018-12-19 MED ORDER — CHLORHEXIDINE GLUCONATE 0.12 % MT SOLN
15.0000 mL | OROMUCOSAL | Status: AC
  Administered 2018-12-19: 15 mL via OROMUCOSAL

## 2018-12-19 MED ORDER — SODIUM BICARBONATE 8.4 % IV SOLN
INTRAVENOUS | Status: DC | PRN
  Administered 2018-12-19: 50 meq via INTRAVENOUS

## 2018-12-19 MED ORDER — FENTANYL CITRATE (PF) 250 MCG/5ML IJ SOLN
INTRAMUSCULAR | Status: AC
  Filled 2018-12-19: qty 5

## 2018-12-19 MED ORDER — SODIUM CHLORIDE 0.9 % IV SOLN
INTRAVENOUS | Status: DC
  Administered 2018-12-19: 17:00:00 via INTRAVENOUS

## 2018-12-19 MED ORDER — LATANOPROST 0.005 % OP SOLN
1.0000 [drp] | Freq: Every day | OPHTHALMIC | Status: DC
  Administered 2018-12-19 – 2018-12-27 (×9): 1 [drp] via OPHTHALMIC
  Filled 2018-12-19: qty 2.5

## 2018-12-19 MED ORDER — PHENYLEPHRINE 40 MCG/ML (10ML) SYRINGE FOR IV PUSH (FOR BLOOD PRESSURE SUPPORT)
PREFILLED_SYRINGE | INTRAVENOUS | Status: DC | PRN
  Administered 2018-12-19: 40 ug via INTRAVENOUS
  Administered 2018-12-19: 120 ug via INTRAVENOUS
  Administered 2018-12-19 (×2): 80 ug via INTRAVENOUS
  Administered 2018-12-19: 120 ug via INTRAVENOUS

## 2018-12-19 MED ORDER — PROPOFOL 10 MG/ML IV BOLUS
INTRAVENOUS | Status: DC | PRN
  Administered 2018-12-19: 50 mg via INTRAVENOUS
  Administered 2018-12-19: 90 mg via INTRAVENOUS

## 2018-12-19 MED ORDER — FENTANYL CITRATE (PF) 250 MCG/5ML IJ SOLN
INTRAMUSCULAR | Status: AC
  Filled 2018-12-19: qty 25

## 2018-12-19 MED ORDER — MILRINONE LACTATE IN DEXTROSE 20-5 MG/100ML-% IV SOLN: 0.1250 ug/kg/min | INTRAVENOUS | Status: DC

## 2018-12-19 MED ORDER — SODIUM CHLORIDE 0.9 % IV SOLN
INTRAVENOUS | Status: DC | PRN
  Administered 2018-12-19: 750 mg via INTRAVENOUS

## 2018-12-19 MED ORDER — PANTOPRAZOLE SODIUM 40 MG PO TBEC
40.0000 mg | DELAYED_RELEASE_TABLET | Freq: Every day | ORAL | Status: DC
  Administered 2018-12-21 – 2018-12-28 (×8): 40 mg via ORAL
  Filled 2018-12-19 (×8): qty 1

## 2018-12-19 MED ORDER — FENTANYL CITRATE (PF) 250 MCG/5ML IJ SOLN
INTRAMUSCULAR | Status: DC | PRN
  Administered 2018-12-19: 250 ug via INTRAVENOUS
  Administered 2018-12-19: 100 ug via INTRAVENOUS
  Administered 2018-12-19: 200 ug via INTRAVENOUS
  Administered 2018-12-19: 250 ug via INTRAVENOUS
  Administered 2018-12-19 (×2): 150 ug via INTRAVENOUS
  Administered 2018-12-19: 250 ug via INTRAVENOUS
  Administered 2018-12-19: 100 ug via INTRAVENOUS
  Administered 2018-12-19: 50 ug via INTRAVENOUS

## 2018-12-19 MED ORDER — SODIUM CHLORIDE 0.9 % IV SOLN
INTRAVENOUS | Status: DC | PRN
  Administered 2018-12-19: 25 ug/min via INTRAVENOUS

## 2018-12-19 MED ORDER — LACTATED RINGERS IV SOLN
INTRAVENOUS | Status: DC | PRN
  Administered 2018-12-19 (×2): via INTRAVENOUS

## 2018-12-19 MED ORDER — INSULIN REGULAR BOLUS VIA INFUSION
0.0000 [IU] | Freq: Three times a day (TID) | INTRAVENOUS | Status: DC
  Filled 2018-12-19: qty 10

## 2018-12-19 MED ORDER — POTASSIUM CHLORIDE 10 MEQ/50ML IV SOLN
10.0000 meq | INTRAVENOUS | Status: AC
  Administered 2018-12-19 (×3): 10 meq via INTRAVENOUS

## 2018-12-19 MED ORDER — ACETAMINOPHEN 160 MG/5ML PO SOLN: 650.0000 mg | Freq: Once | ORAL | Status: AC

## 2018-12-19 MED ORDER — ALBUMIN HUMAN 5 % IV SOLN
250.0000 mL | INTRAVENOUS | Status: AC | PRN
  Administered 2018-12-19 (×5): 12.5 g via INTRAVENOUS
  Filled 2018-12-19: qty 250
  Filled 2018-12-19: qty 500

## 2018-12-19 MED ORDER — BRIMONIDINE TARTRATE 0.15 % OP SOLN
1.0000 [drp] | Freq: Two times a day (BID) | OPHTHALMIC | Status: DC
  Administered 2018-12-20 – 2018-12-28 (×17): 1 [drp] via OPHTHALMIC
  Filled 2018-12-19: qty 5

## 2018-12-19 MED ORDER — SODIUM CHLORIDE 0.9 % IV SOLN: 250.0000 mL | INTRAVENOUS | Status: DC

## 2018-12-19 MED ORDER — DOPAMINE-DEXTROSE 3.2-5 MG/ML-% IV SOLN: 2.5000 ug/kg/min | INTRAVENOUS | Status: DC

## 2018-12-19 MED ORDER — ONDANSETRON HCL 4 MG/2ML IJ SOLN: 4.0000 mg | Freq: Four times a day (QID) | INTRAMUSCULAR | Status: DC | PRN

## 2018-12-19 MED ORDER — DOPAMINE-DEXTROSE 3.2-5 MG/ML-% IV SOLN: 3.0000 ug/kg/min | INTRAVENOUS | Status: DC

## 2018-12-19 MED ORDER — LACTATED RINGERS IV SOLN
INTRAVENOUS | Status: DC | PRN
  Administered 2018-12-19: 07:00:00 via INTRAVENOUS

## 2018-12-19 MED ORDER — SODIUM CHLORIDE 0.9% IV SOLUTION
Freq: Once | INTRAVENOUS | Status: AC
  Administered 2018-12-20: 09:00:00 via INTRAVENOUS

## 2018-12-19 MED ORDER — ACETAMINOPHEN 500 MG PO TABS
1000.0000 mg | ORAL_TABLET | Freq: Four times a day (QID) | ORAL | Status: AC
  Administered 2018-12-20 – 2018-12-24 (×19): 1000 mg via ORAL
  Filled 2018-12-19 (×18): qty 2

## 2018-12-19 MED ORDER — METOPROLOL TARTRATE 12.5 MG HALF TABLET
12.5000 mg | ORAL_TABLET | Freq: Two times a day (BID) | ORAL | Status: DC
  Administered 2018-12-20 – 2018-12-22 (×2): 12.5 mg via ORAL
  Filled 2018-12-19 (×4): qty 1

## 2018-12-19 MED ORDER — PHENYLEPHRINE HCL-NACL 20-0.9 MG/250ML-% IV SOLN
0.0000 ug/min | INTRAVENOUS | Status: DC
  Administered 2018-12-19: 35 ug/min via INTRAVENOUS
  Filled 2018-12-19 (×2): qty 250

## 2018-12-19 MED ORDER — HEPARIN SODIUM (PORCINE) 1000 UNIT/ML IJ SOLN
INTRAMUSCULAR | Status: DC | PRN
  Administered 2018-12-19: 22000 [IU] via INTRAVENOUS
  Administered 2018-12-19: 2000 [IU] via INTRAVENOUS

## 2018-12-19 MED ORDER — CALCIUM GLUCONATE 10 % IV SOLN
INTRAVENOUS | Status: DC | PRN
  Administered 2018-12-19: 500 mg via INTRAVENOUS
  Administered 2018-12-19: 400 mg via INTRAVENOUS
  Administered 2018-12-19: 100 mg via INTRAVENOUS

## 2018-12-19 MED ORDER — ACETAMINOPHEN 160 MG/5ML PO SOLN: 1000.0000 mg | Freq: Four times a day (QID) | ORAL | Status: AC

## 2018-12-19 MED ORDER — HEMOSTATIC AGENTS (NO CHARGE) OPTIME
TOPICAL | Status: DC | PRN
  Administered 2018-12-19 (×4): 1 via TOPICAL

## 2018-12-19 MED ORDER — MILRINONE LACTATE IN DEXTROSE 20-5 MG/100ML-% IV SOLN
0.1250 ug/kg/min | INTRAVENOUS | Status: DC
  Administered 2018-12-20 – 2018-12-21 (×2): 0.125 ug/kg/min via INTRAVENOUS
  Filled 2018-12-19 (×2): qty 100

## 2018-12-19 MED ORDER — VANCOMYCIN HCL IN DEXTROSE 1-5 GM/200ML-% IV SOLN
1000.0000 mg | Freq: Once | INTRAVENOUS | Status: AC
  Administered 2018-12-19: 1000 mg via INTRAVENOUS
  Filled 2018-12-19: qty 200

## 2018-12-19 MED ORDER — DOCUSATE SODIUM 100 MG PO CAPS
200.0000 mg | ORAL_CAPSULE | Freq: Every day | ORAL | Status: DC
  Administered 2018-12-20 – 2018-12-22 (×3): 200 mg via ORAL
  Filled 2018-12-19 (×6): qty 2

## 2018-12-19 MED ORDER — SODIUM CHLORIDE 0.9 % IV SOLN
1.5000 g | Freq: Two times a day (BID) | INTRAVENOUS | Status: AC
  Administered 2018-12-19 – 2018-12-21 (×4): 1.5 g via INTRAVENOUS
  Filled 2018-12-19 (×4): qty 1.5

## 2018-12-19 MED ORDER — LEVOTHYROXINE SODIUM 25 MCG PO TABS
25.0000 ug | ORAL_TABLET | Freq: Every day | ORAL | Status: DC
  Administered 2018-12-20 – 2018-12-28 (×9): 25 ug via ORAL
  Filled 2018-12-19 (×9): qty 1

## 2018-12-19 MED ORDER — OXYCODONE HCL 5 MG PO TABS
5.0000 mg | ORAL_TABLET | ORAL | Status: DC | PRN
  Administered 2018-12-20 – 2018-12-22 (×12): 10 mg via ORAL
  Filled 2018-12-19 (×12): qty 2

## 2018-12-19 MED ORDER — ROCURONIUM BROMIDE 10 MG/ML (PF) SYRINGE
PREFILLED_SYRINGE | INTRAVENOUS | Status: DC | PRN
  Administered 2018-12-19 (×5): 50 mg via INTRAVENOUS

## 2018-12-19 MED ORDER — SODIUM CHLORIDE 0.9% FLUSH
10.0000 mL | Freq: Two times a day (BID) | INTRAVENOUS | Status: DC
  Administered 2018-12-20: 10 mL
  Administered 2018-12-20: 40 mL
  Administered 2018-12-21 – 2018-12-22 (×3): 10 mL

## 2018-12-19 MED ORDER — DEXMEDETOMIDINE HCL IN NACL 200 MCG/50ML IV SOLN
0.0000 ug/kg/h | INTRAVENOUS | Status: DC
  Administered 2018-12-19: 0.7 ug/kg/h via INTRAVENOUS
  Filled 2018-12-19: qty 50

## 2018-12-19 MED ORDER — CALCIUM CHLORIDE 10 % IV SOLN
INTRAVENOUS | Status: AC
  Filled 2018-12-19: qty 20

## 2018-12-19 MED ORDER — POTASSIUM CHLORIDE 10 MEQ/50ML IV SOLN
INTRAVENOUS | Status: AC
  Administered 2018-12-19: 10 meq via INTRAVENOUS
  Filled 2018-12-19: qty 150

## 2018-12-19 SURGICAL SUPPLY — 105 items
ADAPTER CARDIO PERF ANTE/RETRO (ADAPTER) ×4 IMPLANT
APPLICATOR COTTON TIP 6 STRL (MISCELLANEOUS) ×4 IMPLANT
APPLICATOR COTTON TIP 6IN STRL (MISCELLANEOUS) ×8
BAG DECANTER FOR FLEXI CONT (MISCELLANEOUS) ×4 IMPLANT
BANDAGE ACE 4X5 VEL STRL LF (GAUZE/BANDAGES/DRESSINGS) ×8 IMPLANT
BANDAGE ACE 6X5 VEL STRL LF (GAUZE/BANDAGES/DRESSINGS) ×4 IMPLANT
BASKET HEART  (ORDER IN 25'S) (MISCELLANEOUS) ×1
BASKET HEART (ORDER IN 25'S) (MISCELLANEOUS) ×1
BASKET HEART (ORDER IN 25S) (MISCELLANEOUS) ×2 IMPLANT
BLADE CLIPPER SURG (BLADE) ×4 IMPLANT
BLADE STERNUM SYSTEM 6 (BLADE) ×4 IMPLANT
BLADE SURG 12 STRL SS (BLADE) ×4 IMPLANT
BNDG ELASTIC 2X5.8 VLCR STR LF (GAUZE/BANDAGES/DRESSINGS) ×4 IMPLANT
BNDG ELASTIC 6X15 VLCR STRL LF (GAUZE/BANDAGES/DRESSINGS) ×4 IMPLANT
BNDG GAUZE ELAST 4 BULKY (GAUZE/BANDAGES/DRESSINGS) ×8 IMPLANT
CANISTER SUCT 3000ML PPV (MISCELLANEOUS) ×4 IMPLANT
CANNULA GUNDRY RCSP 15FR (MISCELLANEOUS) ×4 IMPLANT
CATH CPB KIT VANTRIGT (MISCELLANEOUS) ×4 IMPLANT
CATH ROBINSON RED A/P 18FR (CATHETERS) ×12 IMPLANT
CATH THORACIC 36FR RT ANG (CATHETERS) ×4 IMPLANT
CLIP FOGARTY SPRING 6M (CLIP) ×4 IMPLANT
COVER WAND RF STERILE (DRAPES) IMPLANT
CRADLE DONUT ADULT HEAD (MISCELLANEOUS) ×4 IMPLANT
DERMABOND ADHESIVE PROPEN (GAUZE/BANDAGES/DRESSINGS) ×2
DERMABOND ADVANCED .7 DNX6 (GAUZE/BANDAGES/DRESSINGS) ×2 IMPLANT
DRAIN CHANNEL 32F RND 10.7 FF (WOUND CARE) ×4 IMPLANT
DRAPE CARDIOVASCULAR INCISE (DRAPES) ×2
DRAPE SLUSH/WARMER DISC (DRAPES) ×4 IMPLANT
DRAPE SRG 135X102X78XABS (DRAPES) ×2 IMPLANT
DRSG AQUACEL AG ADV 3.5X14 (GAUZE/BANDAGES/DRESSINGS) ×4 IMPLANT
ELECT BLADE 4.0 EZ CLEAN MEGAD (MISCELLANEOUS) ×4
ELECT BLADE 6.5 EXT (BLADE) ×4 IMPLANT
ELECT CAUTERY BLADE 6.4 (BLADE) ×4 IMPLANT
ELECT REM PT RETURN 9FT ADLT (ELECTROSURGICAL) ×8
ELECTRODE BLDE 4.0 EZ CLN MEGD (MISCELLANEOUS) ×2 IMPLANT
ELECTRODE REM PT RTRN 9FT ADLT (ELECTROSURGICAL) ×4 IMPLANT
FELT TEFLON 1X6 (MISCELLANEOUS) ×8 IMPLANT
GAUZE SPONGE 4X4 12PLY STRL (GAUZE/BANDAGES/DRESSINGS) ×12 IMPLANT
GLOVE BIO SURGEON STRL SZ 6 (GLOVE) ×24 IMPLANT
GLOVE BIO SURGEON STRL SZ 6.5 (GLOVE) ×3 IMPLANT
GLOVE BIO SURGEON STRL SZ7.5 (GLOVE) ×20 IMPLANT
GLOVE BIO SURGEONS STRL SZ 6.5 (GLOVE) ×1
GLOVE SURG SS PI 6.0 STRL IVOR (GLOVE) ×12 IMPLANT
GOWN STRL REUS W/ TWL LRG LVL3 (GOWN DISPOSABLE) ×24 IMPLANT
GOWN STRL REUS W/TWL LRG LVL3 (GOWN DISPOSABLE) ×24
HEMOCLIP MED TITANIUM (MISCELLANEOUS) ×4 IMPLANT
HEMOSTAT POWDER SURGIFOAM 1G (HEMOSTASIS) ×12 IMPLANT
HEMOSTAT SURGICEL 2X14 (HEMOSTASIS) ×4 IMPLANT
INSERT FOGARTY XLG (MISCELLANEOUS) IMPLANT
KIT BASIN OR (CUSTOM PROCEDURE TRAY) ×4 IMPLANT
KIT SUCTION CATH 14FR (SUCTIONS) ×4 IMPLANT
KIT TURNOVER KIT B (KITS) ×4 IMPLANT
KIT VASOVIEW HEMOPRO 2 VH 4000 (KITS) ×4 IMPLANT
LEAD PACING MYOCARDI (MISCELLANEOUS) ×8 IMPLANT
MARKER GRAFT CORONARY BYPASS (MISCELLANEOUS) ×12 IMPLANT
NS IRRIG 1000ML POUR BTL (IV SOLUTION) ×20 IMPLANT
PACK E OPEN HEART (SUTURE) ×4 IMPLANT
PACK OPEN HEART (CUSTOM PROCEDURE TRAY) ×4 IMPLANT
PAD ARMBOARD 7.5X6 YLW CONV (MISCELLANEOUS) ×8 IMPLANT
PAD ELECT DEFIB RADIOL ZOLL (MISCELLANEOUS) ×4 IMPLANT
PENCIL BUTTON HOLSTER BLD 10FT (ELECTRODE) ×4 IMPLANT
POWDER SURGICEL 3.0 GRAM (HEMOSTASIS) ×4 IMPLANT
PUNCH AORTIC ROTATE  4.5MM 8IN (MISCELLANEOUS) ×4 IMPLANT
PUNCH AORTIC ROTATE 4.0MM (MISCELLANEOUS) IMPLANT
PUNCH AORTIC ROTATE 4.5MM 8IN (MISCELLANEOUS) IMPLANT
PUNCH AORTIC ROTATE 5MM 8IN (MISCELLANEOUS) IMPLANT
SOLUTION ANTI FOG 6CC (MISCELLANEOUS) ×4 IMPLANT
SPONGE LAP 18X18 X RAY DECT (DISPOSABLE) ×20 IMPLANT
SPONGE LAP 4X18 RFD (DISPOSABLE) ×4 IMPLANT
SURGIFLO W/THROMBIN 8M KIT (HEMOSTASIS) ×8 IMPLANT
SUT BONE WAX W31G (SUTURE) ×4 IMPLANT
SUT MNCRL AB 4-0 PS2 18 (SUTURE) ×4 IMPLANT
SUT PROLENE 3 0 SH DA (SUTURE) IMPLANT
SUT PROLENE 3 0 SH1 36 (SUTURE) IMPLANT
SUT PROLENE 4 0 RB 1 (SUTURE) ×8
SUT PROLENE 4 0 SH DA (SUTURE) ×16 IMPLANT
SUT PROLENE 4-0 RB1 .5 CRCL 36 (SUTURE) ×8 IMPLANT
SUT PROLENE 5 0 C 1 36 (SUTURE) IMPLANT
SUT PROLENE 6 0 C 1 30 (SUTURE) IMPLANT
SUT PROLENE 6 0 CC (SUTURE) ×16 IMPLANT
SUT PROLENE 7.0 RB 3 (SUTURE) ×16 IMPLANT
SUT PROLENE 8 0 BV175 6 (SUTURE) ×12 IMPLANT
SUT PROLENE BLUE 7 0 (SUTURE) ×8 IMPLANT
SUT PROLENE POLY MONO (SUTURE) ×8 IMPLANT
SUT SILK  1 MH (SUTURE)
SUT SILK 1 MH (SUTURE) IMPLANT
SUT SILK 2 0 SH CR/8 (SUTURE) ×4 IMPLANT
SUT SILK 3 0 SH CR/8 (SUTURE) IMPLANT
SUT STEEL 6MS V (SUTURE) ×8 IMPLANT
SUT STEEL SZ 6 DBL 3X14 BALL (SUTURE) ×8 IMPLANT
SUT VIC AB 1 CTX 36 (SUTURE) ×4
SUT VIC AB 1 CTX36XBRD ANBCTR (SUTURE) ×4 IMPLANT
SUT VIC AB 2-0 CT1 27 (SUTURE) ×4
SUT VIC AB 2-0 CT1 TAPERPNT 27 (SUTURE) ×4 IMPLANT
SUT VIC AB 2-0 CTX 27 (SUTURE) IMPLANT
SUT VIC AB 3-0 X1 27 (SUTURE) IMPLANT
SYSTEM SAHARA CHEST DRAIN ATS (WOUND CARE) ×4 IMPLANT
TAPE CLOTH SURG 4X10 WHT LF (GAUZE/BANDAGES/DRESSINGS) ×4 IMPLANT
TAPE PAPER 2X10 WHT MICROPORE (GAUZE/BANDAGES/DRESSINGS) ×4 IMPLANT
TOWEL GREEN STERILE (TOWEL DISPOSABLE) ×4 IMPLANT
TOWEL GREEN STERILE FF (TOWEL DISPOSABLE) ×4 IMPLANT
TRAY FOLEY SLVR 16FR TEMP STAT (SET/KITS/TRAYS/PACK) ×4 IMPLANT
TUBING INSUFFLATION (TUBING) ×4 IMPLANT
UNDERPAD 30X30 (UNDERPADS AND DIAPERS) ×4 IMPLANT
WATER STERILE IRR 1000ML POUR (IV SOLUTION) ×8 IMPLANT

## 2018-12-19 NOTE — Anesthesia Procedure Notes (Signed)
Arterial Line Insertion Start/End2/13/2020 6:45 AM, 12/19/2018 6:55 AM Performed by: Adonis Housekeeper, CRNA  Patient location: Pre-op. Preanesthetic checklist: patient identified, IV checked, site marked, risks and benefits discussed, surgical consent, monitors and equipment checked and pre-op evaluation Lidocaine 1% used for infiltration and patient sedated Left, radial was placed Catheter size: 20 G Hand hygiene performed , maximum sterile barriers used  and Seldinger technique used Allen's test indicative of satisfactory collateral circulation Attempts: 1 Procedure performed without using ultrasound guided technique. Following insertion, dressing applied and Biopatch. Post procedure assessment: normal

## 2018-12-19 NOTE — Procedures (Signed)
Extubation Procedure Note  Patient Details:   Name: PSALM WOLFERT DOB: [birth year] MRN: [id number]   Airway Documentation:    Vent end date: 12/19/18 Vent end time: 2253   Evaluation  O2 sats: stable throughout Complications: No apparent complications Patient did tolerate procedure well. Bilateral Breath Sounds: Clear   Yes   Pt. Extubated per rapid wean protocol. Prior to extubation, pt. Performed a -28 on the NIF and .8L on the vital capacity. Pt. Did not have a positive cuff leak, MD Cornelius Moras was notified and gave permission to extubate. After extubation pt. Performed 750x5 on the IS. No issues at this time, pt. Currently on 4L nasal cannula.      Adela Ports 12/19/2018, 11:02 PM

## 2018-12-19 NOTE — Progress Notes (Signed)
[facility] BRIEF SICU PROGRESS NOTE  Day of Surgery  S/P Procedure(s) (LRB): CORONARY ARTERY BYPASS GRAFTING (CABG)x3, using left internal mammary arteryand right and left greater saphenous veins harvested endoscopically (N/A) TRANSESOPHAGEAL ECHOCARDIOGRAM (TEE) (N/A)   Sedated on vent AAI paced w/ stable hemodynamics on milrinone, Neo and dopamine O2 sats 100% Chest tube output low UOP excellent Labs okay  Plan: Continue routine early postop  Purcell Nails, MD 12/19/2018 7:06 PM

## 2018-12-19 NOTE — OR Nursing (Signed)
1st call to SICU made @1442 . Spoke with [employer] Cheral Marker

## 2018-12-19 NOTE — Brief Op Note (Signed)
12/14/2018 - 12/19/2018  8:48 AM  PATIENT:  Melissa Gonzales  78 y.o. female  PRE-OPERATIVE DIAGNOSIS:  CAD  POST-OPERATIVE DIAGNOSIS:  CAD  PROCEDURE:  Procedure(s): CORONARY ARTERY BYPASS GRAFTING (CABG)x3, using left internal mammary arteryand right and left greater saphenous veins harvested endoscopically (N/A) TRANSESOPHAGEAL ECHOCARDIOGRAM (TEE) (N/A)  LIMA to LAD SVG to PDA SVG to OM1  SURGEON:  Surgeon(s) and Role:    Kerin Perna, MD - Primary  PHYSICIAN ASSISTANT:  Jari Favre, PA-C  ANESTHESIA:   general  EBL:  900 mL   BLOOD ADMINISTERED:none  DRAINS: ROUTINE   LOCAL MEDICATIONS USED:  NONE  SPECIMEN:  No Specimen  DISPOSITION OF SPECIMEN:  N/A  COUNTS:  YES  TOURNIQUET:  * No tourniquets in log *  DICTATION: .Dragon Dictation  PLAN OF CARE: Admit to inpatient   PATIENT DISPOSITION:  ICU - intubated and hemodynamically stable.   Delay start of Pharmacological VTE agent (>24hrs) due to surgical blood loss or risk of bleeding: yes

## 2018-12-19 NOTE — Progress Notes (Signed)
Ventilator changes per verbal order of MD  

## 2018-12-19 NOTE — Anesthesia Procedure Notes (Addendum)
Central Venous Catheter Insertion Performed by: Achille Rich, MD, anesthesiologist Start/End2/13/2020 6:56 AM, 12/19/2018 7:10 AM Patient location: Pre-op. Preanesthetic checklist: patient identified, IV checked, site marked, risks and benefits discussed, surgical consent, monitors and equipment checked, pre-op evaluation, timeout performed and anesthesia consent Position: Trendelenburg Lidocaine 1% used for infiltration and patient sedated Hand hygiene performed , maximum sterile barriers used  and Seldinger technique used Catheter size: 8.5 Fr Central line and PA cath was placed.Sheath introducer Swan type:thermodilation Procedure performed using ultrasound guided technique. Ultrasound Notes:anatomy identified, needle tip was noted to be adjacent to the nerve/plexus identified, no ultrasound evidence of intravascular and/or intraneural injection and image(s) printed for medical record Attempts: 1 Following insertion, line sutured, dressing applied and Biopatch. Post procedure assessment: blood return through all ports, free fluid flow and no air  Patient tolerated the procedure well with no immediate complications.

## 2018-12-19 NOTE — Progress Notes (Signed)
  Echocardiogram Echocardiogram Transesophageal has been performed.  Janalyn Harder 12/19/2018, 8:54 AM

## 2018-12-19 NOTE — Anesthesia Preprocedure Evaluation (Signed)
Anesthesia Evaluation  Patient identified by MRN, date of birth, ID band Patient awake    Reviewed: Allergy & Precautions, NPO status , Patient's Chart, lab work & pertinent test results, reviewed documented beta blocker date and time   Airway Mallampati: III  TM Distance: >3 FB Neck ROM: Full    Dental no notable dental hx.    Pulmonary neg pulmonary ROS,    Pulmonary exam normal breath sounds clear to auscultation       Cardiovascular hypertension, Pt. on medications + angina + CAD  Normal cardiovascular exam Rhythm:Regular Rate:Normal     Neuro/Psych  Headaches, PSYCHIATRIC DISORDERS Anxiety Depression  Neuromuscular disease    GI/Hepatic Neg liver ROS, GERD  Medicated and Controlled,  Endo/Other  negative endocrine ROS  Renal/GU negative Renal ROS     Musculoskeletal negative musculoskeletal ROS (+)   Abdominal   Peds  Hematology negative hematology ROS (+)   Anesthesia Other Findings CAD  Reproductive/Obstetrics                             Anesthesia Physical Anesthesia Plan  ASA: IV  Anesthesia Plan: General   Post-op Pain Management:    Induction: Intravenous  PONV Risk Score and Plan: 3 and Midazolam and Treatment may vary due to age or medical condition  Airway Management Planned: Oral ETT  Additional Equipment: Arterial line, CVP, PA Cath, TEE and Ultrasound Guidance Line Placement  Intra-op Plan:   Post-operative Plan: Post-operative intubation/ventilation  Informed Consent: I have reviewed the patients History and Physical, chart, labs and discussed the procedure including the risks, benefits and alternatives for the proposed anesthesia with the patient or authorized representative who has indicated his/her understanding and acceptance.     Dental advisory given  Plan Discussed with: CRNA  Anesthesia Plan Comments:        Anesthesia Quick Evaluation

## 2018-12-19 NOTE — Transfer of Care (Signed)
Immediate Anesthesia Transfer of Care Note  Patient: Melissa Gonzales  Procedure(s) Performed: CORONARY ARTERY BYPASS GRAFTING (CABG)x3, using left internal mammary arteryand right and left greater saphenous veins harvested endoscopically (N/A Chest) TRANSESOPHAGEAL ECHOCARDIOGRAM (TEE) (N/A )  Patient Location: PACU  Anesthesia Type:General  Level of Consciousness: Patient remains intubated per anesthesia plan  Airway & Oxygen Therapy: Patient remains intubated per anesthesia plan and Patient placed on Ventilator (see vital sign flow sheet for setting)  Post-op Assessment: Report given to RN and Post -op Vital signs reviewed and stable  Post vital signs: Reviewed and stable  Last Vitals:  Vitals Value Taken Time  BP    Temp    Pulse    Resp    SpO2      Last Pain:  Vitals:   12/19/18 0420  TempSrc: Oral  PainSc:       Patients Stated Pain Goal: 0 (12/18/18 2000)  Complications: No apparent anesthesia complications

## 2018-12-19 NOTE — Progress Notes (Signed)
Wean protocol initiated. 

## 2018-12-19 NOTE — Progress Notes (Signed)
Pre Procedure note for inpatients:   Melissa Gonzales has been scheduled for Procedure(s): CORONARY ARTERY BYPASS GRAFTING (CABG) (N/A) TRANSESOPHAGEAL ECHOCARDIOGRAM (TEE) (N/A) today. The various methods of treatment have been discussed with the patient. After consideration of the risks, benefits and treatment options the patient has consented to the planned procedure.   The patient has been seen and labs reviewed. There are no changes in the patient's condition to prevent proceeding with the planned procedure today.  Recent labs:  Lab Results  Component Value Date   WBC 5.6 12/19/2018   HGB 13.7 12/19/2018   HCT 41.4 12/19/2018   PLT 193 12/19/2018   GLUCOSE 110 (H) 12/19/2018   CHOL 177 12/15/2018   TRIG 116 12/15/2018   HDL 57 12/15/2018   LDLDIRECT 118 (H) 06/02/2008   LDLCALC 97 12/15/2018   ALT 40 12/15/2018   AST 41 12/15/2018   NA 143 12/19/2018   K 4.9 12/19/2018   CL 107 12/19/2018   CREATININE 0.74 12/19/2018   BUN 13 12/19/2018   CO2 27 12/19/2018   TSH 5.337 (H) 12/14/2018   INR 0.95 12/18/2018   HGBA1C 5.8 (H) 12/15/2018    Mikey Bussing, MD 12/19/2018 6:47 AM

## 2018-12-20 ENCOUNTER — Inpatient Hospital Stay (HOSPITAL_COMMUNITY): Payer: Medicare Other

## 2018-12-20 ENCOUNTER — Encounter (HOSPITAL_COMMUNITY): Payer: Self-pay | Admitting: Cardiothoracic Surgery

## 2018-12-20 LAB — BASIC METABOLIC PANEL
Anion gap: 5 (ref 5–15)
Anion gap: 10 (ref 5–15)
BUN: 10 mg/dL (ref 8–23)
BUN: 11 mg/dL (ref 8–23)
CALCIUM: 8.3 mg/dL — AB (ref 8.9–10.3)
CO2: 24 mmol/L (ref 22–32)
CO2: 21 mmol/L — AB (ref 22–32)
Calcium: 8.1 mg/dL — ABNORMAL LOW (ref 8.9–10.3)
Chloride: 113 mmol/L — ABNORMAL HIGH (ref 98–111)
Chloride: 104 mmol/L (ref 98–111)
Creatinine, Ser: 0.52 mg/dL (ref 0.44–1.00)
Creatinine, Ser: 0.72 mg/dL (ref 0.44–1.00)
GFR calc Af Amer: >60 mL/min (ref >60)
GFR calc non Af Amer: >60 mL/min (ref >60)
Glucose, Bld: 108 mg/dL — ABNORMAL HIGH (ref 70–99)
Glucose, Bld: 151 mg/dL — ABNORMAL HIGH (ref 70–99)
Potassium: 4 mmol/L (ref 3.5–5.1)
Potassium: 3.7 mmol/L (ref 3.5–5.1)
Sodium: 142 mmol/L (ref 135–145)
Sodium: 135 mmol/L (ref 135–145)

## 2018-12-20 LAB — BPAM FFP
Blood Product Expiration Date: 202002132359
Blood Product Expiration Date: 202002132359
ISSUE DATE / TIME: 202002131235
ISSUE DATE / TIME: 202002131235
Unit Type and Rh: 6200
Unit Type and Rh: 6200

## 2018-12-20 LAB — GLUCOSE, CAPILLARY
Glucose-Capillary: 103 mg/dL — ABNORMAL HIGH (ref 70–99)
Glucose-Capillary: 106 mg/dL — ABNORMAL HIGH (ref 70–99)
Glucose-Capillary: 106 mg/dL — ABNORMAL HIGH (ref 70–99)
Glucose-Capillary: 111 mg/dL — ABNORMAL HIGH (ref 70–99)
Glucose-Capillary: 111 mg/dL — ABNORMAL HIGH (ref 70–99)
Glucose-Capillary: 129 mg/dL — ABNORMAL HIGH (ref 70–99)
Glucose-Capillary: 136 mg/dL — ABNORMAL HIGH (ref 70–99)
Glucose-Capillary: 148 mg/dL — ABNORMAL HIGH (ref 70–99)
Glucose-Capillary: 90 mg/dL (ref 70–99)
Glucose-Capillary: 92 mg/dL (ref 70–99)
Glucose-Capillary: 96 mg/dL (ref 70–99)

## 2018-12-20 LAB — PREPARE FRESH FROZEN PLASMA: Unit division: 0

## 2018-12-20 LAB — POCT I-STAT 7, (LYTES, BLD GAS, ICA,H+H)
Acid-base deficit: 2 mmol/L (ref 0.0–2.0)
BICARBONATE: 22.6 mmol/L (ref 20.0–28.0)
Calcium, Ion: 1.26 mmol/L (ref 1.15–1.40)
HCT: 19 % — ABNORMAL LOW (ref 36.0–46.0)
HEMOGLOBIN: 6.5 g/dL — AB (ref 12.0–15.0)
O2 Saturation: 97 %
PH ART: 7.389 (ref 7.350–7.450)
Potassium: 3.8 mmol/L (ref 3.5–5.1)
Sodium: 142 mmol/L (ref 135–145)
TCO2: 24 mmol/L (ref 22–32)
pCO2 arterial: 37.5 mmHg (ref 32.0–48.0)
pO2, Arterial: 87 mmHg (ref 83.0–108.0)

## 2018-12-20 LAB — BPAM PLATELET PHERESIS
Blood Product Expiration Date: 202002152359
ISSUE DATE / TIME: 202002131329
Unit Type and Rh: 9500

## 2018-12-20 LAB — PREPARE PLATELET PHERESIS: UNIT DIVISION: 0

## 2018-12-20 LAB — CBC
HCT: 21.8 % — ABNORMAL LOW (ref 36.0–46.0)
HEMATOCRIT: 27.4 % — AB (ref 36.0–46.0)
Hemoglobin: 7.6 g/dL — ABNORMAL LOW (ref 12.0–15.0)
Hemoglobin: 9 g/dL — ABNORMAL LOW (ref 12.0–15.0)
MCH: 34.5 pg — ABNORMAL HIGH (ref 26.0–34.0)
MCH: 32.5 pg (ref 26.0–34.0)
MCHC: 34.9 g/dL (ref 30.0–36.0)
MCHC: 32.8 g/dL (ref 30.0–36.0)
MCV: 99.1 fL (ref 80.0–100.0)
MCV: 98.9 fL (ref 80.0–100.0)
PLATELETS: 96 10*3/uL — AB (ref 150–400)
Platelets: 113 10*3/uL — ABNORMAL LOW (ref 150–400)
RBC: 2.2 MIL/uL — ABNORMAL LOW (ref 3.87–5.11)
RBC: 2.77 MIL/uL — ABNORMAL LOW (ref 3.87–5.11)
RDW: 15.7 % — AB (ref 11.5–15.5)
RDW: 15.6 % — ABNORMAL HIGH (ref 11.5–15.5)
WBC: 5.1 10*3/uL (ref 4.0–10.5)
WBC: 9.7 10*3/uL (ref 4.0–10.5)
nRBC: 0 % (ref 0.0–0.2)
nRBC: 0 % (ref 0.0–0.2)

## 2018-12-20 LAB — MAGNESIUM
Magnesium: 2.2 mg/dL (ref 1.7–2.4)
Magnesium: 2.2 mg/dL (ref 1.7–2.4)

## 2018-12-20 LAB — PREPARE RBC (CROSSMATCH)

## 2018-12-20 MED ORDER — KETOROLAC TROMETHAMINE 15 MG/ML IJ SOLN
15.0000 mg | Freq: Four times a day (QID) | INTRAMUSCULAR | Status: DC
  Administered 2018-12-21 (×2): 15 mg via INTRAVENOUS
  Filled 2018-12-20: qty 1

## 2018-12-20 MED ORDER — INSULIN ASPART 100 UNIT/ML ~~LOC~~ SOLN
0.0000 [IU] | SUBCUTANEOUS | Status: DC
  Administered 2018-12-20 (×3): 2 [IU] via SUBCUTANEOUS

## 2018-12-20 MED ORDER — MOMETASONE FURO-FORMOTEROL FUM 200-5 MCG/ACT IN AERO
2.0000 | INHALATION_SPRAY | Freq: Two times a day (BID) | RESPIRATORY_TRACT | Status: DC
  Administered 2018-12-21 – 2018-12-28 (×15): 2 via RESPIRATORY_TRACT
  Filled 2018-12-20: qty 8.8

## 2018-12-20 MED ORDER — POTASSIUM CHLORIDE 10 MEQ/50ML IV SOLN: 10.0000 meq | INTRAVENOUS | Status: DC | PRN

## 2018-12-20 MED ORDER — KETOROLAC TROMETHAMINE 15 MG/ML IJ SOLN: 15.0000 mg | Freq: Four times a day (QID) | INTRAMUSCULAR | Status: DC

## 2018-12-20 MED ORDER — INSULIN ASPART 100 UNIT/ML ~~LOC~~ SOLN: 0.0000 [IU] | SUBCUTANEOUS | Status: DC

## 2018-12-20 MED ORDER — VANCOMYCIN HCL IN DEXTROSE 1-5 GM/200ML-% IV SOLN
1000.0000 mg | Freq: Once | INTRAVENOUS | Status: AC
  Administered 2018-12-20: 1000 mg via INTRAVENOUS
  Filled 2018-12-20: qty 200

## 2018-12-20 MED ORDER — KETOROLAC TROMETHAMINE 15 MG/ML IJ SOLN
INTRAMUSCULAR | Status: AC
  Administered 2018-12-20: 15 mg
  Filled 2018-12-20: qty 1

## 2018-12-20 MED ORDER — FUROSEMIDE 10 MG/ML IJ SOLN: 20.0000 mg | Freq: Two times a day (BID) | INTRAMUSCULAR | Status: DC

## 2018-12-20 MED ORDER — FUROSEMIDE 10 MG/ML IJ SOLN
20.0000 mg | Freq: Once | INTRAMUSCULAR | Status: AC
  Administered 2018-12-20: 20 mg via INTRAVENOUS
  Filled 2018-12-20: qty 2

## 2018-12-20 MED ORDER — GUAIFENESIN ER 600 MG PO TB12
600.0000 mg | ORAL_TABLET | Freq: Two times a day (BID) | ORAL | Status: DC
  Administered 2018-12-20 – 2018-12-28 (×16): 600 mg via ORAL
  Filled 2018-12-20 (×17): qty 1

## 2018-12-20 MED ORDER — POTASSIUM CHLORIDE 10 MEQ/50ML IV SOLN
10.0000 meq | INTRAVENOUS | Status: AC
  Administered 2018-12-20 (×3): 10 meq via INTRAVENOUS
  Filled 2018-12-20 (×3): qty 50

## 2018-12-20 MED ORDER — KETOROLAC TROMETHAMINE 15 MG/ML IJ SOLN: 2.0000 mg | Freq: Four times a day (QID) | INTRAMUSCULAR | Status: DC

## 2018-12-20 MED ORDER — ALPRAZOLAM 0.25 MG PO TABS
0.2500 mg | ORAL_TABLET | Freq: Three times a day (TID) | ORAL | Status: DC | PRN
  Administered 2018-12-20 – 2018-12-21 (×4): 0.25 mg via ORAL
  Filled 2018-12-20 (×4): qty 1

## 2018-12-20 MED ORDER — FUROSEMIDE 10 MG/ML IJ SOLN
40.0000 mg | Freq: Two times a day (BID) | INTRAMUSCULAR | Status: DC
  Administered 2018-12-21: 40 mg via INTRAVENOUS

## 2018-12-20 MED ORDER — DOPAMINE-DEXTROSE 3.2-5 MG/ML-% IV SOLN: 2.0000 ug/kg/min | INTRAVENOUS | Status: DC

## 2018-12-20 MED ORDER — FUROSEMIDE 10 MG/ML IJ SOLN
20.0000 mg | Freq: Two times a day (BID) | INTRAMUSCULAR | Status: DC
  Administered 2018-12-20 (×2): 20 mg via INTRAVENOUS
  Filled 2018-12-20 (×2): qty 2

## 2018-12-20 NOTE — Progress Notes (Signed)
CT surgery p.m. Rounds  Patient sitting up in bed Complains of pulmonary congestion P.m. labs satisfactory Maintaining sinus rhythm Continue Lasix diuresis and low-dose milrinone

## 2018-12-20 NOTE — Progress Notes (Signed)
[facility] DAILY ICU PROGRESS NOTE                   [address]            [place] [address]          [phone]   1 Day Post-Op Procedure(s) (LRB): CORONARY ARTERY BYPASS GRAFTING (CABG)x3, using left internal mammary arteryand right and left greater saphenous veins harvested endoscopically (N/A) TRANSESOPHAGEAL ECHOCARDIOGRAM (TEE) (N/A)  Total Length of Stay:  LOS: 3 days   Subjective: Anxious this morning. She otherwise feels "fair". Having some incisional pain.   Objective: Vital signs in last 24 hours: Temp:  [96.1 F (35.6 C)-98.8 F (37.1 C)] 97.7 F (36.5 C) (02/14 0600) Pulse Rate:  [88-95] 89 (02/14 0600) Cardiac Rhythm: Atrial paced (02/13 2000) Resp:  [10-25] 13 (02/14 0600) BP: (88-130)/(47-72) 88/65 (02/14 0600) SpO2:  [92 %-100 %] 100 % (02/14 0600) Arterial Line BP: (81-174)/(37-83) 99/37 (02/14 0600) FiO2 (%):  [40 %-80 %] 40 % (02/13 2147) Weight:  [76.5 kg] 76.5 kg (02/14 0500)  Filed Weights   12/18/18 0348 12/19/18 0420 12/20/18 0500  Weight: 69.2 kg 68.4 kg 76.5 kg    Weight change: 8.098 kg   Hemodynamic parameters for last 24 hours: PAP: (17-31)/(7-20) 24/11 CO:  [2.8 L/min-4 L/min] 3.8 L/min CI:  [1.6 L/min/m2-2.3 L/min/m2] 2.2 L/min/m2  Intake/Output from previous day: 02/13 0701 - 02/14 0700 In: 8630.5 [P.O.:240; I.V.:5650.9; Blood:678; NG/GT:30; IV Piggyback:2031.5] Out: 4940 [Urine:3350; Blood:900; Chest Tube:690]  Intake/Output this shift: No intake/output data recorded.  Current Meds: Scheduled Meds: . acetaminophen  1,000 mg Oral Q6H   Or  . acetaminophen (TYLENOL) oral liquid 160 mg/5 mL  1,000 mg Per Tube Q6H  . aspirin EC  325 mg Oral Daily   Or  . aspirin  324 mg Per Tube Daily  . atorvastatin  80 mg Oral q1800  . bisacodyl  10 mg Oral Daily   Or  . bisacodyl  10 mg Rectal Daily  . brimonidine  1 drop Both Eyes Q12H  . Chlorhexidine Gluconate Cloth  6 each Topical Daily  . docusate sodium  200 mg  Oral Daily  . dorzolamide-timolol  1 drop Both Eyes BID  . ezetimibe  10 mg Oral Daily  . furosemide  20 mg Intravenous BID  . insulin aspart  0-24 Units Subcutaneous Q4H  . insulin aspart  0-24 Units Subcutaneous Q4H  . latanoprost  1 drop Both Eyes QHS  . levothyroxine  25 mcg Oral Q0600  . mouth rinse  15 mL Mouth Rinse BID  . metoprolol tartrate  12.5 mg Oral BID   Or  . metoprolol tartrate  12.5 mg Per Tube BID  . [START ON 12/21/2018] pantoprazole  40 mg Oral Daily  . sodium chloride flush  10-40 mL Intracatheter Q12H  . sodium chloride flush  3 mL Intravenous Q12H   Continuous Infusions: . sodium chloride 10 mL/hr at 12/20/18 0700  . sodium chloride    . sodium chloride 10 mL/hr at 12/19/18 1642  . albumin human 12.5 g (12/19/18 2011)  . cefUROXime (ZINACEF)  IV 1.5 g (12/20/18 0749)  . DOPamine    . lactated ringers    . lactated ringers 20 mL/hr at 12/20/18 0700  . milrinone 0.125 mcg/kg/min (12/20/18 0700)  . phenylephrine (NEO-SYNEPHRINE) Adult infusion Stopped (12/20/18 0347)  . vancomycin 1,000 mg (12/20/18 0830)   PRN Meds:.sodium chloride, albumin human, fentaNYL (SUBLIMAZE) injection, metoprolol tartrate, ondansetron (  ZOFRAN) IV, oxyCODONE, sodium chloride flush, sodium chloride flush, traMADol  General appearance: alert, cooperative and no distress Heart: regular rate and rhythm, S1, S2 normal, no murmur, click, rub or gallop Lungs: clear to auscultation bilaterally and diminished in the lower lobes Abdomen: soft, non-tender; bowel sounds normal; no masses,  no organomegaly Extremities: upper extremity edema Wound: clean and dry dressed with a sterile dressing  Lab Results: CBC: Recent Labs    12/19/18 2200  12/20/18 0040 12/20/18 0353  WBC 5.9  --   --  5.1  HGB 7.9*   < > 6.5* 7.6*  HCT 23.4*   < > 19.0* 21.8*  PLT 103*  --   --  96*   < > = values in this interval not displayed.   BMET:  Recent Labs    12/19/18 2200  12/20/18 0040  12/20/18 0353  NA 141   < > 142 142  K 3.4*   < > 3.8 4.0  CL 109  --   --  113*  CO2 22  --   --  24  GLUCOSE 112*  --   --  108*  BUN 9  --   --  10  CREATININE 0.51  --   --  0.52  CALCIUM 8.5*  --   --  8.3*   < > = values in this interval not displayed.    CMET: Lab Results  Component Value Date   WBC 5.1 12/20/2018   HGB 7.6 (L) 12/20/2018   HCT 21.8 (L) 12/20/2018   PLT 96 (L) 12/20/2018   GLUCOSE 108 (H) 12/20/2018   CHOL 177 12/15/2018   TRIG 116 12/15/2018   HDL 57 12/15/2018   LDLDIRECT 118 (H) 06/02/2008   LDLCALC 97 12/15/2018   ALT 40 12/15/2018   AST 41 12/15/2018   NA 142 12/20/2018   K 4.0 12/20/2018   CL 113 (H) 12/20/2018   CREATININE 0.52 12/20/2018   BUN 10 12/20/2018   CO2 24 12/20/2018   TSH 5.337 (H) 12/14/2018   INR 1.48 12/19/2018   HGBA1C 5.8 (H) 12/15/2018      PT/INR:  Recent Labs    12/19/18 1558  LABPROT 17.8*  INR 1.48   Radiology: Dg Chest Port 1 View  Result Date: 12/19/2018 CLINICAL DATA:  S/P CABG x 3 Pt unresponsive Nurse present throughout EXAM: PORTABLE CHEST 1 VIEW COMPARISON:  CT chest on 12/16/2026, chest x-ray 08/04/2019 FINDINGS: Endotracheal tube is in place with tip likely just above the carina. Nasogastric tube is in place, tip beyond the gastroesophageal junction off the image. A LEFT-sided chest tube is in place. RIGHT Swan-Ganz catheter tip overlies the RIGHT LOWER lobe pulmonary artery. LEFT pleural effusion. LEFT LOWER lobe atelectasis/consolidation partially obscuring the hemidiaphragm. No evidence for pneumothorax. IMPRESSION: 1. Postoperative changes. 2. No evidence for pneumothorax. Electronically Signed   By: Norva PavlovElizabeth  Brown M.D.   On: 12/19/2018 16:56     Assessment/Plan: S/P Procedure(s) (LRB): CORONARY ARTERY BYPASS GRAFTING (CABG)x3, using left internal mammary arteryand right and left greater saphenous veins harvested endoscopically (N/A) TRANSESOPHAGEAL ECHOCARDIOGRAM (TEE) (N/A)  1. CV-BP soft  at times. Remains on Dopamine and Milrinone. Off Neo. NSR in the 80s. 2. Pulm- CXR this morning shows low lung volumes and a left pleural effusion. Continue incentive spirometer and pulm toilet.  3. Renal-creatinine 0.52, electrolytes okay.  4. H and H 7.6/21.8, getting a unit of pRBC. Platelets 96k 5. Endo-blood glucose well controlled.  6. Anxiety-take xanax at home  for sleep. Will make 0.25mg  available q8 hours PRN for anxiety.  Plan: Weaning dopamine as tolerated. Discontinue swan-ganz catheter. A-line out later today. Continue foley catheter. Receiving 1 unit pRBC, received 5 albumins overnight for hypotension. BP better this morning. Remains on low-dose milrinone. Encouraged incentive spirometer use hourly. Working on pain control this morning.      Sharlene Dory 12/20/2018 8:54 AM

## 2018-12-20 NOTE — Anesthesia Postprocedure Evaluation (Signed)
Anesthesia Post Note  Patient: Melissa Gonzales  Procedure(s) Performed: CORONARY ARTERY BYPASS GRAFTING (CABG)x3, using left internal mammary arteryand right and left greater saphenous veins harvested endoscopically (N/A Chest) TRANSESOPHAGEAL ECHOCARDIOGRAM (TEE) (N/A )     Patient location during evaluation: SICU Anesthesia Type: General Level of consciousness: sedated Pain management: pain level controlled Vital Signs Assessment: post-procedure vital signs reviewed and stable Respiratory status: patient remains intubated per anesthesia plan Cardiovascular status: stable Postop Assessment: no apparent nausea or vomiting Anesthetic complications: no    Last Vitals:  Vitals:   12/20/18 0500 12/20/18 0600  BP: (!) 95/49 (!) 88/65  Pulse: 88 89  Resp: 18 13  Temp: 36.6 C 36.5 C  SpO2: 100% 100%    Last Pain:  Vitals:   12/20/18 0532  TempSrc:   PainSc: 7                  Krystofer Hevener S

## 2018-12-20 NOTE — Plan of Care (Signed)
  Problem: Cardiac: Goal: Will achieve and/or maintain hemodynamic stability Outcome: Progressing  CI maintaining >2 Problem: Respiratory: Goal: Respiratory status will improve Outcome: Progressing  Pt extubated Problem: Urinary Elimination: Goal: Ability to achieve and maintain adequate renal perfusion and functioning will improve Outcome: Progressing UOP WNL

## 2018-12-21 ENCOUNTER — Inpatient Hospital Stay (HOSPITAL_COMMUNITY): Payer: Medicare Other

## 2018-12-21 ENCOUNTER — Inpatient Hospital Stay: Payer: Self-pay

## 2018-12-21 LAB — CBC
HCT: 24.4 % — ABNORMAL LOW (ref 36.0–46.0)
Hemoglobin: 8.4 g/dL — ABNORMAL LOW (ref 12.0–15.0)
MCH: 34.1 pg — ABNORMAL HIGH (ref 26.0–34.0)
MCHC: 34.4 g/dL (ref 30.0–36.0)
MCV: 99.2 fL (ref 80.0–100.0)
Platelets: 115 10*3/uL — ABNORMAL LOW (ref 150–400)
RBC: 2.46 MIL/uL — ABNORMAL LOW (ref 3.87–5.11)
RDW: 15.4 % (ref 11.5–15.5)
WBC: 11.3 10*3/uL — ABNORMAL HIGH (ref 4.0–10.5)
nRBC: 0 % (ref 0.0–0.2)

## 2018-12-21 LAB — BASIC METABOLIC PANEL
Anion gap: 5 (ref 5–15)
BUN: 15 mg/dL (ref 8–23)
CO2: 25 mmol/L (ref 22–32)
Calcium: 8.1 mg/dL — ABNORMAL LOW (ref 8.9–10.3)
Chloride: 102 mmol/L (ref 98–111)
Creatinine, Ser: 0.79 mg/dL (ref 0.44–1.00)
GFR calc Af Amer: >60 mL/min (ref >60)
GFR calc non Af Amer: >60 mL/min (ref >60)
Glucose, Bld: 107 mg/dL — ABNORMAL HIGH (ref 70–99)
Potassium: 4.3 mmol/L (ref 3.5–5.1)
Sodium: 132 mmol/L — ABNORMAL LOW (ref 135–145)

## 2018-12-21 LAB — GLUCOSE, CAPILLARY
Glucose-Capillary: 67 mg/dL — ABNORMAL LOW (ref 70–99)
Glucose-Capillary: 73 mg/dL (ref 70–99)
Glucose-Capillary: 82 mg/dL (ref 70–99)

## 2018-12-21 LAB — COOXEMETRY PANEL
Carboxyhemoglobin: 1.3 % (ref 0.5–1.5)
Methemoglobin: 1.7 % — ABNORMAL HIGH (ref 0.0–1.5)
O2 Saturation: 62.9 %
Total hemoglobin: 8.7 g/dL — ABNORMAL LOW (ref 12.0–16.0)

## 2018-12-21 MED ORDER — ORAL CARE MOUTH RINSE
15.0000 mL | Freq: Two times a day (BID) | OROMUCOSAL | Status: DC
  Administered 2018-12-22 – 2018-12-25 (×4): 15 mL via OROMUCOSAL

## 2018-12-21 MED ORDER — SODIUM CHLORIDE 0.9% FLUSH: 10.0000 mL | INTRAVENOUS | Status: DC | PRN

## 2018-12-21 MED ORDER — FUROSEMIDE 10 MG/ML IJ SOLN
40.0000 mg | Freq: Every day | INTRAMUSCULAR | Status: DC
  Administered 2018-12-22 – 2018-12-24 (×3): 40 mg via INTRAVENOUS
  Filled 2018-12-21 (×4): qty 4

## 2018-12-21 MED ORDER — ENSURE ENLIVE PO LIQD
237.0000 mL | Freq: Three times a day (TID) | ORAL | Status: DC
  Administered 2018-12-21 – 2018-12-27 (×4): 237 mL via ORAL

## 2018-12-21 MED ORDER — FE FUMARATE-B12-VIT C-FA-IFC PO CAPS
1.0000 | ORAL_CAPSULE | Freq: Two times a day (BID) | ORAL | Status: DC
  Administered 2018-12-21 – 2018-12-22 (×2): 1 via ORAL
  Filled 2018-12-21 (×2): qty 1

## 2018-12-21 MED ORDER — SODIUM CHLORIDE 0.9% FLUSH
10.0000 mL | Freq: Two times a day (BID) | INTRAVENOUS | Status: DC
  Administered 2018-12-21 – 2018-12-25 (×8): 10 mL

## 2018-12-21 MED ORDER — CHLORHEXIDINE GLUCONATE CLOTH 2 % EX PADS
6.0000 | MEDICATED_PAD | Freq: Every day | CUTANEOUS | Status: DC
  Administered 2018-12-22 – 2018-12-28 (×5): 6 via TOPICAL

## 2018-12-21 MED ORDER — ALPRAZOLAM 0.5 MG PO TABS
0.5000 mg | ORAL_TABLET | Freq: Three times a day (TID) | ORAL | Status: DC | PRN
  Administered 2018-12-21 – 2018-12-23 (×5): 0.5 mg via ORAL
  Filled 2018-12-21 (×6): qty 1

## 2018-12-21 NOTE — Op Note (Signed)
NAME: Melissa Gonzales, [name] MEDICAL RECORD XN:[id number] ACCOUNT [id number] DATE OF BIRTH:[birth year] FACILITY: [facility] LOCATION: [facility] PHYSICIAN: VAN TRIGT III, MD  OPERATIVE REPORT  DATE OF PROCEDURE:  12/19/2018  OPERATION: 1.  Coronary artery bypass grafting x3 (left internal mammary artery to left anterior descending, saphenous vein graft to obtuse marginal 2, saphenous vein graft to posterior descending). 2.  Bilateral leg endoscopic saphenous vein harvest.  SURGEON:  Kerin Perna III, MD  ASSISTANT:  Jari Favre PA-C   PREOPERATIVE DIAGNOSES:  Severe 3-vessel coronary artery disease, unstable angina, preserved left ventricular systolic function, hypertension.  POSTOPERATIVE DIAGNOSES:  Severe 3-vessel coronary artery disease, unstable angina, preserved ventricular systolic function, hypertension.  CLINICAL NOTE:  The patient is a 78 year old female who was admitted with chest pain, shortness of breath and palpitations.  Cardiac enzymes are mildly elevated.  CT scan of the chest was negative for PE.  Echocardiogram showed LVH with mild-moderate AI.   Cardiac catheterization showed severe diffuse 3-vessel coronary disease with suboptimal targets in the LAD and distal right coronary distributions.  There is a large patent OM1 branch of the circumflex and a moderate-sized OM2 branch with an 80%  stenosis.  LVEDP was normal.  She was recommended for CABG by her cardiologist.  She was not felt to be a good candidate for PCI.  After reviewing her coronary angiogram and echocardiogram images, I examined the patient and discussed the procedure of CABG for treatment of her CAD.  I explained to her that her coronary vessels were not operable targets and that complete  revascularization would not be possible, but surgical revascularization would provide benefit.  She also understood that surgical revascularization would entail potential risks including stroke, bleeding, blood transfusion  requirement, infection,  postoperative pulmonary problems including pleural effusion, postoperative organ failure, and death.  After reviewing these issues, she demonstrated understanding and agreed to proceed with surgery under what I felt was informed consent.  I felt that  high-risk CABG would be her best long-term option for treatment of her CAD.  OPERATIVE FINDINGS: 1.  Difficult vein conduit requiring bilateral vein harvest. 2.  Very difficult targets in the LAD and distal right distribution with heavily calcified 1 mm vessel with diffuse disease. 3.  Post-pump coagulopathy requiring blood product transfusion.  DESCRIPTION OF PROCEDURE:  The patient was brought to the operating room and placed supine on the operating table and general anesthesia was induced.  A transesophageal echo probe was placed by the anesthesia team.  The patient was prepped and draped as  a sterile field.  She remained stable.  A proper time-out was performed.  A sternal incision was made as the saphenous vein was harvested endoscopically from first the right leg and then the left leg.  The left internal mammary artery was harvested as a  pedicle graft from its origin at the subclavian vessels.  It was a 1.4 mm vessel with good flow.  The sternal retractor was placed using the deep blades because of the obese body habitus.  The pericardium was opened and suspended.  The heart was enlarged  from history of hypertension.  Pursestrings were placed in the ascending aorta and right atrium and heparin was administered.  When the ACT was therapeutic, the patient was cannulated and placed on bypass.  A significant amount of time was used to  identify the location on the LAD and right coronary artery to perform the anastomosis.  Cardioplegic cannulas were placed both antegrade and retrograde cold blood cardioplegia, and  the mammary artery and veins were prepared for the distal anastomoses.   The patient was cooled to 32 degrees, and  the aortic cross-clamp was applied.  One liter of cold blood cardioplegia was delivered in split doses between the antegrade aortic and retrograde coronary sinus catheters.  There was good cardioplegic arrest,  and supple temperature dropped less than 12 degrees.  Cardioplegia was delivered every 20 minutes while the cross-clamp was in place.  The distal coronary anastomoses were performed.  The first distal anastomosis was the distal posterior descending.  This was heavily diseased.  A reverse saphenous vein was sewn end-to-side with running 7-0 Prolene.  There was adequate flow through the  graft.  Cardioplegia was redosed.  A second distal anastomosis was to the OM2 branch of the left circumflex.  This was a 1.5 mm vessel with proximal 80% stenosis.  A reverse saphenous vein was sewn end-to-side with running 7-0 Prolene with good flow through the graft.  Cardioplegia was  redosed.  The third distal anastomosis was the LAD.  The LAD was a bifid vessel, and the 90% stenosis was in the larger of the 2 branches.  However, this was also heavily calcified and diffusely diseased.  A site in the LAD was selected for the anastomosis, and  the anastomosis was carried out with the mammary artery and brought through the pericardium using a running 8-0 Prolene.  There was good flow through the anastomosis after briefly releasing the pedicle bulldog and the mammary artery.  The bulldog was  reapplied to the pedicle and the pedicle secured to the epicardium with 6-0 Prolenes.  Cardioplegia was redosed.  While the cross-clamp was still in place, the proximal anastomosis was performed.  The proximal vein to the posterior descending was too small to sew directly to the aorta, so the posterior descending graft was sewn to the hood of the vein graft to the  OM2 in an end-to-side fashion.  After the proximal anastomoses were performed, air was vented from the coronaries with a dose of retrograde warm blood cardioplegia,  and the cross-clamp was removed.  The patient was rewarmed and reperfused.  It took a while for the body temperature to reach 37 degrees.  The proximal and distal anastomoses were checked and found to be hemostatic.  Temporary pacing wires were applied.  The lungs were expanded.  The  patient was then weaned from cardiopulmonary bypass.  Low-dose inotropes were started.  The patient's heart was enlarged, and the epicardial fat was very friable.  After separation from cardiopulmonary bypass, there was bleeding from the epicardial fat on the lateral ventricle.  This was controlled with pledgeted 4-0 Prolene suture and  topical thrombin static agents.  The patient was observed for a significant amount of time to make sure hemostasis was adequate, and in fact due to low platelet count, the patient received FFP and platelets to provide adequate hemostasis.  The patients blood pressure remained stable when the heart was  not temporarily repositioned to place the  pledgeted suture to control epicardial fat bleeding.  After hemostasis was satisfactory, the superior pericardial fat was closed over the aorta.  The anterior mediastinum and left pleural chest tubes were placed and brought out through separate incisions.  The sternum was closed with wire.  The patient  remained stable.  The pectoralis fascia  was closed with #1 Vicryl.  The patient remained stable.  The subcutaneous and skin layers were closed with running Vicryl.  Total cardiopulmonary bypass time  was 212 minutes.  The patient returned to the ICU in  stable condition.  LN/NUANCE  D:12/21/2018 T:12/21/2018 JOB:[id number]

## 2018-12-21 NOTE — Progress Notes (Signed)
Hypoglycemic Event  CBG: 67  Treatment: 4 oz juice/soda & graham crackers  Symptoms: Hungry  Follow-up CBG: Time:0450 CBG Result:73 Possible Reasons for Event: Inadequate meal intake  Comments/MD notified:no    Alva Kuenzel, Lollie Marrow

## 2018-12-21 NOTE — Progress Notes (Signed)
CT Surgery PM Rounds  Walked in hall nsr Chest tubes out Needs more diuresis

## 2018-12-21 NOTE — Plan of Care (Signed)
  Problem: Cardiac: Goal: Will achieve and/or maintain hemodynamic stability Outcome: Progressing  Hemodynamics maintained on dopamine and milrinone gtts Problem: Respiratory: Goal: Respiratory status will improve Outcome: Not Progressing  Pt remains on 5 l O2 via [place]. Desaturates and becomes SOB with activity, and anxiety.

## 2018-12-21 NOTE — Progress Notes (Signed)
Peripherally Inserted Central Catheter/Midline Placement  The IV Nurse has discussed with the patient and/or persons authorized to consent for the patient, the purpose of this procedure and the potential benefits and risks involved with this procedure.  The benefits include less needle sticks, lab draws from the catheter, and the patient may be discharged home with the catheter. Risks include, but not limited to, infection, bleeding, blood clot (thrombus formation), and puncture of an artery; nerve damage and irregular heartbeat and possibility to perform a PICC exchange if needed/ordered by physician.  Alternatives to this procedure were also discussed.  Bard Power PICC patient education guide, fact sheet on infection prevention and patient information card has been provided to patient /or left at bedside.    PICC/Midline Placement Documentation  PICC Double Lumen 12/21/18 PICC Right Brachial 38 cm 0 cm (Active)  Indication for Insertion or Continuance of Line Prolonged intravenous therapies 12/21/2018  5:00 PM  Exposed Catheter (cm) 0 cm 12/21/2018  5:00 PM  Site Assessment Clean;Dry;Intact 12/21/2018  5:00 PM  Lumen #1 Status Flushed;Blood return noted;Saline locked 12/21/2018  5:00 PM  Lumen #2 Status Flushed;Blood return noted;Saline locked 12/21/2018  5:00 PM  Dressing Type Transparent 12/21/2018  5:00 PM  Dressing Status Clean;Dry;Intact;Antimicrobial disc in place 12/21/2018  5:00 PM  Line Care Connections checked and tightened 12/21/2018  5:00 PM  Line Adjustment (NICU/IV Team Only) No 12/21/2018  5:00 PM  Dressing Intervention New dressing 12/21/2018  5:00 PM  Dressing Change Due 12/28/18 12/21/2018  5:00 PM       Edwin Cap 12/21/2018, 5:10 PM

## 2018-12-21 NOTE — Progress Notes (Signed)
2 Days Post-Op Procedure(s) (LRB): CORONARY ARTERY BYPASS GRAFTING (CABG)x3, using left internal mammary arteryand right and left greater saphenous veins harvested endoscopically (N/A) TRANSESOPHAGEAL ECHOCARDIOGRAM (TEE) (N/A) Subjective: Patient feeling better with less pain today Maintaining sinus rhythm Chest tube output tapering down-we will remove Still on 5 L nasal cannula-needs more diuresis Co-ox this a.m. 60% Continue low-dose milrinone and dopamine to assist with diuresis  Objective: Vital signs in last 24 hours: Temp:  [97.3 F (36.3 C)-97.9 F (36.6 C)] 97.9 F (36.6 C) (02/15 0827) Pulse Rate:  [70-90] 83 (02/15 0500) Resp:  [9-25] 11 (02/15 0500) BP: (79-128)/(41-78) 97/53 (02/15 0500) SpO2:  [86 %-100 %] 97 % (02/15 0854) Arterial Line BP: (93-146)/(45-55) 109/46 (02/14 1445) Weight:  [76.2 kg] 76.2 kg (02/15 0159)  Hemodynamic parameters for last 24 hours:    Intake/Output from previous day: 02/14 0701 - 02/15 0700 In: 3035 [P.O.:1800; I.V.:375.2; Blood:342.5; IV Piggyback:517.3] Out: 2370 [Urine:1390; Chest Tube:980] Intake/Output this shift: No intake/output data recorded.       Exam    General- alert and comfortable    Neck- no JVD, no cervical adenopathy palpable, no carotid bruit   Lungs- clear without rales, wheezes   Cor- regular rate and rhythm, no murmur , gallop   Abdomen- soft, non-tender   Extremities - warm, non-tender, minimal edema   Neuro- oriented, appropriate, no focal weakness   Lab Results: Recent Labs    12/20/18 1607 12/21/18 0235  WBC 9.7 11.3*  HGB 9.0* 8.4*  HCT 27.4* 24.4*  PLT 113* 115*   BMET:  Recent Labs    12/20/18 1607 12/21/18 0235  NA 135 132*  K 3.7 4.3  CL 104 102  CO2 21* 25  GLUCOSE 151* 107*  BUN 11 15  CREATININE 0.72 0.79  CALCIUM 8.1* 8.1*    PT/INR:  Recent Labs    12/19/18 1558  LABPROT 17.8*  INR 1.48   ABG    Component Value Date/Time   PHART 7.389 12/20/2018 0040   HCO3  22.6 12/20/2018 0040   TCO2 24 12/20/2018 0040   ACIDBASEDEF 2.0 12/20/2018 0040   O2SAT 62.9 12/21/2018 0243   CBG (last 3)  Recent Labs    12/21/18 0418 12/21/18 0451 12/21/18 0819  GLUCAP 67* 73 82    Assessment/Plan: S/P Procedure(s) (LRB): CORONARY ARTERY BYPASS GRAFTING (CABG)x3, using left internal mammary arteryand right and left greater saphenous veins harvested endoscopically (N/A) TRANSESOPHAGEAL ECHOCARDIOGRAM (TEE) (N/A) Continue with diuresis and mobilization Expected postop blood loss anemia Maintaining sinus rhythm Keep in ICU today   LOS: 4 days    Melissa Gonzales 12/21/2018

## 2018-12-22 ENCOUNTER — Inpatient Hospital Stay (HOSPITAL_COMMUNITY): Payer: Medicare Other

## 2018-12-22 LAB — BPAM RBC
Blood Product Expiration Date: 202003092359
Blood Product Expiration Date: 202003092359
Blood Product Expiration Date: 202003092359
Blood Product Expiration Date: 202003092359
ISSUE DATE / TIME: 202002130929
ISSUE DATE / TIME: 202002130929
ISSUE DATE / TIME: 202002131435
ISSUE DATE / TIME: 202002140844
Unit Type and Rh: 6200
Unit Type and Rh: 6200
Unit Type and Rh: 6200
Unit Type and Rh: 6200

## 2018-12-22 LAB — CBC
HCT: 23.1 % — ABNORMAL LOW (ref 36.0–46.0)
Hemoglobin: 7.9 g/dL — ABNORMAL LOW (ref 12.0–15.0)
MCH: 34.1 pg — ABNORMAL HIGH (ref 26.0–34.0)
MCHC: 34.2 g/dL (ref 30.0–36.0)
MCV: 99.6 fL (ref 80.0–100.0)
Platelets: 114 10*3/uL — ABNORMAL LOW (ref 150–400)
RBC: 2.32 MIL/uL — ABNORMAL LOW (ref 3.87–5.11)
RDW: 14.6 % (ref 11.5–15.5)
WBC: 7.6 10*3/uL (ref 4.0–10.5)
nRBC: 0 % (ref 0.0–0.2)

## 2018-12-22 LAB — COOXEMETRY PANEL
Carboxyhemoglobin: 1.3 % (ref 0.5–1.5)
Methemoglobin: 1.6 % — ABNORMAL HIGH (ref 0.0–1.5)
O2 Saturation: 60.4 %
Total hemoglobin: 7.9 g/dL — ABNORMAL LOW (ref 12.0–16.0)

## 2018-12-22 LAB — TYPE AND SCREEN
ABO/RH(D): A POS
Antibody Screen: NEGATIVE
Unit division: 0
Unit division: 0
Unit division: 0
Unit division: 0

## 2018-12-22 LAB — BASIC METABOLIC PANEL
Anion gap: 6 (ref 5–15)
BUN: 11 mg/dL (ref 8–23)
CO2: 24 mmol/L (ref 22–32)
Calcium: 8.1 mg/dL — ABNORMAL LOW (ref 8.9–10.3)
Chloride: 103 mmol/L (ref 98–111)
Creatinine, Ser: 0.54 mg/dL (ref 0.44–1.00)
GFR calc Af Amer: >60 mL/min (ref >60)
GFR calc non Af Amer: >60 mL/min (ref >60)
Glucose, Bld: 116 mg/dL — ABNORMAL HIGH (ref 70–99)
Potassium: 3.8 mmol/L (ref 3.5–5.1)
Sodium: 133 mmol/L — ABNORMAL LOW (ref 135–145)

## 2018-12-22 MED ORDER — OXYCODONE HCL 5 MG PO TABS
5.0000 mg | ORAL_TABLET | ORAL | Status: DC | PRN
  Administered 2018-12-22 – 2018-12-23 (×2): 5 mg via ORAL
  Filled 2018-12-22 (×2): qty 1

## 2018-12-22 MED ORDER — SPIRITUS FRUMENTI
1.0000 | Freq: Every day | ORAL | Status: DC
  Administered 2018-12-22: 1 via ORAL
  Filled 2018-12-22 (×2): qty 1

## 2018-12-22 MED ORDER — SORBITOL 70 % SOLN: 30.0000 mL | Freq: Once | Status: DC

## 2018-12-22 MED ORDER — URIBEL 118 MG PO CAPS
1.0000 | ORAL_CAPSULE | Freq: Two times a day (BID) | ORAL | Status: DC | PRN
  Filled 2018-12-22 (×2): qty 1

## 2018-12-22 MED ORDER — LORAZEPAM 2 MG/ML IJ SOLN
2.0000 mg | Freq: Once | INTRAMUSCULAR | Status: AC
  Administered 2018-12-22: 1 mg via INTRAVENOUS
  Filled 2018-12-22: qty 1

## 2018-12-22 MED ORDER — LORAZEPAM 2 MG/ML IJ SOLN: 2.0000 mg | Freq: Once | INTRAMUSCULAR | Status: DC

## 2018-12-22 MED ORDER — THIAMINE HCL 100 MG/ML IJ SOLN
100.0000 mg | Freq: Every day | INTRAMUSCULAR | Status: DC
  Administered 2018-12-22 – 2018-12-24 (×3): 100 mg via INTRAVENOUS
  Filled 2018-12-22 (×3): qty 2

## 2018-12-22 MED ORDER — BISACODYL 10 MG RE SUPP: 10.0000 mg | Freq: Once | RECTAL | Status: DC

## 2018-12-22 MED ORDER — PENTOSAN POLYSULFATE SODIUM 100 MG PO CAPS
100.0000 mg | ORAL_CAPSULE | Freq: Three times a day (TID) | ORAL | Status: DC
  Administered 2018-12-22 – 2018-12-28 (×18): 100 mg via ORAL
  Filled 2018-12-22 (×21): qty 1

## 2018-12-22 MED ORDER — ENOXAPARIN SODIUM 40 MG/0.4ML ~~LOC~~ SOLN: 40.0000 mg | SUBCUTANEOUS | Status: DC

## 2018-12-22 MED ORDER — LATANOPROST 0.005 % OP SOLN: 1.0000 [drp] | Freq: Every day | OPHTHALMIC | Status: DC

## 2018-12-22 MED ORDER — SORBITOL 70 % PO SOLN
30.0000 mL | Freq: Once | ORAL | Status: DC
  Filled 2018-12-22 (×2): qty 30

## 2018-12-22 NOTE — Progress Notes (Signed)
Report received from Tab, [place]. All drips & plan of care discussed. Care assumed at this time.

## 2018-12-22 NOTE — Progress Notes (Signed)
Report given to Tacey Ruiz, [employer] fund[employer]. All drips & plan of care discussed. Pt asleep in bed, call bell in reach. Care relinquished at this time.

## 2018-12-22 NOTE — Progress Notes (Signed)
3 Days Post-Op Procedure(s) (LRB): CORONARY ARTERY BYPASS GRAFTING (CABG)x3, using left internal mammary arteryand right and left greater saphenous veins harvested endoscopically (N/A) TRANSESOPHAGEAL ECHOCARDIOGRAM (TEE) (N/A) Subjective: Patient very anxious this a.m., complaining of abdominal cramping pain which was relieved with bowel movement.  Now resting comfortably.  Remains above preoperative weight on IV Lasix  Maintaining sinus rhythm Mixed venous saturation 60%, satisfactory Dopamine weaned off, continue low-dose milrinone  We will keep in ICU today. 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  We have continued her preoperative Xanax.  Objective: Vital signs in last 24 hours: Temp:  [98 F (36.7 C)-98.3 F (36.8 C)] 98 F (36.7 C) (02/16 0810) Pulse Rate:  [84-253] 87 (02/16 1200) Cardiac Rhythm: Normal sinus rhythm (02/16 0800) Resp:  [10-22] 13 (02/16 1200) BP: (89-115)/(46-63) 112/59 (02/16 1200) SpO2:  [89 %-100 %] 100 % (02/16 1200) Weight:  [76.2 kg] 76.2 kg (02/16 0500)  Hemodynamic parameters for last 24 hours:    Intake/Output from previous day: 02/15 0701 - 02/16 0700 In: 1072.1 [P.O.:840; I.V.:132.1; IV Piggyback:100] Out: 2750 [Urine:2650; Chest Tube:100] Intake/Output this shift: Total I/O In: 259.3 [P.O.:240; I.V.:19.3] Out: -   Alert and comfortable no resting after bowel movement Lungs clear Sternal incision clean and dry Abdomen nontender or distended Extremities warm with mild edema  Lab Results: Recent Labs    12/21/18 0235 12/22/18 0437  WBC 11.3* 7.6  HGB 8.4* 7.9*  HCT 24.4* 23.1*  PLT 115* 114*   BMET:  Recent Labs    12/21/18 0235 12/22/18 0437  NA 132* 133*  K 4.3 3.8  CL 102 103  CO2 25 24  GLUCOSE 107* 116*  BUN 15 11  CREATININE 0.79 0.54  CALCIUM 8.1* 8.1*    PT/INR:  Recent Labs    12/19/18 1558  LABPROT 17.8*  INR 1.48   ABG    Component Value Date/Time   PHART 7.389 12/20/2018  0040   HCO3 22.6 12/20/2018 0040   TCO2 24 12/20/2018 0040   ACIDBASEDEF 2.0 12/20/2018 0040   O2SAT 60.4 12/22/2018 0440   CBG (last 3)  Recent Labs    12/21/18 0418 12/21/18 0451 12/21/18 0819  GLUCAP 67* 73 82    Assessment/Plan: S/P Procedure(s) (LRB): CORONARY ARTERY BYPASS GRAFTING (CABG)x3, using left internal mammary arteryand right and left greater saphenous veins harvested endoscopically (N/A) TRANSESOPHAGEAL ECHOCARDIOGRAM (TEE) (N/A) Mobilize Diuresis Continue milrinone another day to help with diuresis   LOS: 5 days    Kathlee Nations Trigt III 12/22/2018

## 2018-12-22 NOTE — Plan of Care (Signed)
  Problem: Activity: Goal: Ability to tolerate increased activity will improve Outcome: Progressing   

## 2018-12-23 ENCOUNTER — Inpatient Hospital Stay (HOSPITAL_COMMUNITY): Payer: Medicare Other

## 2018-12-23 LAB — COOXEMETRY PANEL
Carboxyhemoglobin: 1.4 % (ref 0.5–1.5)
Methemoglobin: 1.6 % — ABNORMAL HIGH (ref 0.0–1.5)
O2 Saturation: 55.7 %
Total hemoglobin: 8.6 g/dL — ABNORMAL LOW (ref 12.0–16.0)

## 2018-12-23 LAB — CBC
HCT: 25.3 % — ABNORMAL LOW (ref 36.0–46.0)
Hemoglobin: 8.4 g/dL — ABNORMAL LOW (ref 12.0–15.0)
MCH: 33.3 pg (ref 26.0–34.0)
MCHC: 33.2 g/dL (ref 30.0–36.0)
MCV: 100.4 fL — ABNORMAL HIGH (ref 80.0–100.0)
Platelets: 173 10*3/uL (ref 150–400)
RBC: 2.52 MIL/uL — ABNORMAL LOW (ref 3.87–5.11)
RDW: 14.5 % (ref 11.5–15.5)
WBC: 6.8 10*3/uL (ref 4.0–10.5)
nRBC: 0 % (ref 0.0–0.2)

## 2018-12-23 LAB — BASIC METABOLIC PANEL
Anion gap: 6 (ref 5–15)
BUN: 9 mg/dL (ref 8–23)
CO2: 25 mmol/L (ref 22–32)
Calcium: 8.2 mg/dL — ABNORMAL LOW (ref 8.9–10.3)
Chloride: 106 mmol/L (ref 98–111)
Creatinine, Ser: 0.48 mg/dL (ref 0.44–1.00)
GFR calc Af Amer: >60 mL/min (ref >60)
GFR calc non Af Amer: >60 mL/min (ref >60)
Glucose, Bld: 101 mg/dL — ABNORMAL HIGH (ref 70–99)
Potassium: 3.5 mmol/L (ref 3.5–5.1)
Sodium: 137 mmol/L (ref 135–145)

## 2018-12-23 MED ORDER — ALPRAZOLAM 0.25 MG PO TABS: 0.2500 mg | ORAL_TABLET | Freq: Two times a day (BID) | ORAL | Status: DC | PRN

## 2018-12-23 MED ORDER — POTASSIUM CHLORIDE 10 MEQ/50ML IV SOLN
10.0000 meq | INTRAVENOUS | Status: AC | PRN
  Administered 2018-12-23 (×3): 10 meq via INTRAVENOUS
  Filled 2018-12-23 (×3): qty 50

## 2018-12-23 MED ORDER — SODIUM CHLORIDE 0.9 % IV SOLN
INTRAVENOUS | Status: DC | PRN
  Administered 2018-12-23 – 2018-12-24 (×2): via INTRAVENOUS

## 2018-12-23 MED ORDER — ALPRAZOLAM 0.25 MG PO TABS
0.2500 mg | ORAL_TABLET | Freq: Two times a day (BID) | ORAL | Status: DC | PRN
  Administered 2018-12-23: 0.25 mg via ORAL
  Filled 2018-12-23: qty 1

## 2018-12-23 MED ORDER — CARVEDILOL 3.125 MG PO TABS
3.1250 mg | ORAL_TABLET | Freq: Two times a day (BID) | ORAL | Status: DC
  Administered 2018-12-23 – 2018-12-28 (×10): 3.125 mg via ORAL
  Filled 2018-12-23 (×11): qty 1

## 2018-12-23 MED ORDER — SPIRITUS FRUMENTI
1.0000 | Freq: Every day | ORAL | Status: DC
  Administered 2018-12-23 – 2018-12-25 (×3): 1 via ORAL
  Filled 2018-12-23 (×3): qty 1

## 2018-12-23 NOTE — Progress Notes (Addendum)
      [address]       [place] [address]             [phone]      4 Days Post-Op Procedure(s) (LRB): CORONARY ARTERY BYPASS GRAFTING (CABG)x3, using left internal mammary arteryand right and left greater saphenous veins harvested endoscopically (N/A) TRANSESOPHAGEAL ECHOCARDIOGRAM (TEE) (N/A) Subjective: Sleepy this morning but able to tell me where she is, name and birthday. Events from last night noted. Daughter is in the room now.   Objective: Vital signs in last 24 hours: Temp:  [97.6 F (36.4 C)-98.1 F (36.7 C)] 97.6 F (36.4 C) (02/17 0333) Pulse Rate:  [51-94] 72 (02/17 0700) Cardiac Rhythm: Normal sinus rhythm (02/17 0400) Resp:  [11-28] 22 (02/17 0700) BP: (89-131)/(51-70) 118/67 (02/17 0700) SpO2:  [83 %-100 %] 96 % (02/17 0700) Weight:  [76.7 kg] 76.7 kg (02/17 0500)     Intake/Output from previous day: 02/16 0701 - 02/17 0700 In: 1274.6 [P.O.:1142; I.V.:82.6; IV Piggyback:50] Out: 1900 [Urine:1900] Intake/Output this shift: No intake/output data recorded.  General appearance: alert, cooperative and no distress Heart: regular rate and rhythm, S1, S2 normal, no murmur, click, rub or gallop Lungs: clear to auscultation bilaterally Abdomen: soft, non-tender; bowel sounds normal; no masses,  no organomegaly Extremities: 1-2+ pitting edema in lower extremity Wound: clean and dry  Lab Results: Recent Labs    12/22/18 0437 12/23/18 0416  WBC 7.6 6.8  HGB 7.9* 8.4*  HCT 23.1* 25.3*  PLT 114* 173   BMET:  Recent Labs    12/22/18 0437 12/23/18 0416  NA 133* 137  K 3.8 3.5  CL 103 106  CO2 24 25  GLUCOSE 116* 101*  BUN 11 9  CREATININE 0.54 0.48  CALCIUM 8.1* 8.2*    PT/INR: No results for input(s): LABPROT, INR in the last 72 hours. ABG    Component Value Date/Time   PHART 7.389 12/20/2018 0040   HCO3 22.6 12/20/2018 0040   TCO2 24 12/20/2018 0040   ACIDBASEDEF 2.0 12/20/2018 0040   O2SAT 55.7 12/23/2018 0430   CBG  (last 3)  Recent Labs    12/21/18 0418 12/21/18 0451 12/21/18 0819  GLUCAP 67* 73 82    Assessment/Plan: S/P Procedure(s) (LRB): CORONARY ARTERY BYPASS GRAFTING (CABG)x3, using left internal mammary arteryand right and left greater saphenous veins harvested endoscopically (N/A) TRANSESOPHAGEAL ECHOCARDIOGRAM (TEE) (N/A)  1. CV-BP well controlled, NSR in  70s. Continue ASA, lipitor, and lopressor. Remains on milrinone 0.125 coox 55.7.  2. Pulm-tolerating 2L [place]. Continue to use incentive spirometer. Weaning oxygen as tolerated.  3. Renal-creatinine 0.48, potassium 3.5- getting IV potassium 4. H and H  8.4/25.3 5. Endo-blood glucose 67,73,and 82-well controlled 6. Anxiety-she is on xanax 0.5mg  q 8 hours PRN.  Plan: Anxiety/confusion is affecting patient's physical progress. She is calm this morning and oriented to person and place. Continue PRN xanax. Hold narcotic pain medication if possible. Weaning milrinone-coox 55.7 this morning. Advance diet today to cardiac. Abdominal pain has resolved with bowel movements.    LOS: 6 days    Sharlene Dory 12/23/2018   Appears overmedicated- cut back on xanax but enough she wont withdrawal Dc narcotics DC milrinone PT consult Not ready to move out of ICU  patient examined and medical record reviewed,agree with above note. Kathlee Nations Trigt III 12/23/2018

## 2018-12-23 NOTE — Progress Notes (Addendum)
Pt awakened from sleep, confused.  Pt disoriented to place, time, and situation.  Pt reoriented, however, still very confused.  Pt saying "why are you all trying to kill me" and  please don't kill me." Pt given 0.5mg  xanax and daughter Synetta Fail called per pt request.  Synetta Fail updated on situation and spoke with pt. Synetta Fail on way to see pt.  Will continue to monitor pt.

## 2018-12-23 NOTE — Progress Notes (Signed)
Pt awakened and confused, c/o 10/10 chest pain.  Pt says she is being "choked." Nasal cannula loosened around patient's neck, pt reoriented, and given 5mg  oxy IR po.  Will continue to monitor pt.

## 2018-12-23 NOTE — Progress Notes (Signed)
Patient ID: Melissa Gonzales, female   DOB: 1941-07-25, 78 y.o.   MRN: [id number] [facility] Evening Rounds:  Hemodynamically stable off milrinone.  Good urine output  sats 100%  Eating dinner and talking with family.

## 2018-12-24 LAB — CBC
HCT: 26 % — ABNORMAL LOW (ref 36.0–46.0)
Hemoglobin: 8.5 g/dL — ABNORMAL LOW (ref 12.0–15.0)
MCH: 33.5 pg (ref 26.0–34.0)
MCHC: 32.7 g/dL (ref 30.0–36.0)
MCV: 102.4 fL — ABNORMAL HIGH (ref 80.0–100.0)
Platelets: 197 10*3/uL (ref 150–400)
RBC: 2.54 MIL/uL — ABNORMAL LOW (ref 3.87–5.11)
RDW: 14.6 % (ref 11.5–15.5)
WBC: 6.3 10*3/uL (ref 4.0–10.5)
nRBC: 0 % (ref 0.0–0.2)

## 2018-12-24 LAB — COMPREHENSIVE METABOLIC PANEL
ALT: 22 U/L (ref 0–44)
AST: 20 U/L (ref 15–41)
Albumin: 2.6 g/dL — ABNORMAL LOW (ref 3.5–5.0)
Alkaline Phosphatase: 41 U/L (ref 38–126)
Anion gap: 8 (ref 5–15)
BUN: 7 mg/dL — ABNORMAL LOW (ref 8–23)
CO2: 25 mmol/L (ref 22–32)
Calcium: 8.2 mg/dL — ABNORMAL LOW (ref 8.9–10.3)
Chloride: 103 mmol/L (ref 98–111)
Creatinine, Ser: 0.54 mg/dL (ref 0.44–1.00)
GFR calc Af Amer: >60 mL/min (ref >60)
GFR calc non Af Amer: >60 mL/min (ref >60)
Glucose, Bld: 132 mg/dL — ABNORMAL HIGH (ref 70–99)
Potassium: 3.4 mmol/L — ABNORMAL LOW (ref 3.5–5.1)
Sodium: 136 mmol/L (ref 135–145)
Total Bilirubin: 1 mg/dL (ref 0.3–1.2)
Total Protein: 5.2 g/dL — ABNORMAL LOW (ref 6.5–8.1)

## 2018-12-24 LAB — COOXEMETRY PANEL
Carboxyhemoglobin: 1.4 % (ref 0.5–1.5)
Methemoglobin: 1.7 % — ABNORMAL HIGH (ref 0.0–1.5)
O2 Saturation: 58.4 %
Total hemoglobin: 8.4 g/dL — ABNORMAL LOW (ref 12.0–16.0)

## 2018-12-24 MED ORDER — ALPRAZOLAM 0.25 MG PO TABS
0.2500 mg | ORAL_TABLET | Freq: Three times a day (TID) | ORAL | Status: DC | PRN
  Administered 2018-12-24 – 2018-12-28 (×10): 0.25 mg via ORAL
  Filled 2018-12-24 (×11): qty 1

## 2018-12-24 MED ORDER — VITAMIN B-1 100 MG PO TABS
100.0000 mg | ORAL_TABLET | Freq: Every day | ORAL | Status: DC
  Administered 2018-12-25 – 2018-12-28 (×4): 100 mg via ORAL
  Filled 2018-12-24 (×4): qty 1

## 2018-12-24 MED ORDER — POTASSIUM CHLORIDE 10 MEQ/50ML IV SOLN
10.0000 meq | INTRAVENOUS | Status: AC
  Administered 2018-12-24 (×2): 10 meq via INTRAVENOUS
  Filled 2018-12-24 (×2): qty 50

## 2018-12-24 MED ORDER — POTASSIUM CHLORIDE CRYS ER 20 MEQ PO TBCR
20.0000 meq | EXTENDED_RELEASE_TABLET | ORAL | Status: AC
  Administered 2018-12-24 (×3): 20 meq via ORAL
  Filled 2018-12-24 (×3): qty 1

## 2018-12-24 MED FILL — Potassium Chloride Inj 2 mEq/ML: INTRAVENOUS | Qty: 40 | Status: AC

## 2018-12-24 MED FILL — Mannitol IV Soln 20%: INTRAVENOUS | Qty: 500 | Status: AC

## 2018-12-24 MED FILL — Sodium Chloride IV Soln 0.9%: INTRAVENOUS | Qty: 2000 | Status: AC

## 2018-12-24 MED FILL — Sodium Bicarbonate IV Soln 8.4%: INTRAVENOUS | Qty: 50 | Status: AC

## 2018-12-24 MED FILL — Heparin Sodium (Porcine) Inj 1000 Unit/ML: INTRAMUSCULAR | Qty: 10 | Status: AC

## 2018-12-24 MED FILL — Magnesium Sulfate Inj 50%: INTRAMUSCULAR | Qty: 10 | Status: AC

## 2018-12-24 MED FILL — Heparin Sodium (Porcine) Inj 1000 Unit/ML: INTRAMUSCULAR | Qty: 30 | Status: AC

## 2018-12-24 MED FILL — Lidocaine HCl(Cardiac) IV PF Soln Pref Syr 100 MG/5ML (2%): INTRAVENOUS | Qty: 5 | Status: AC

## 2018-12-24 MED FILL — Electrolyte-R (PH 7.4) Solution: INTRAVENOUS | Qty: 5000 | Status: AC

## 2018-12-24 NOTE — Evaluation (Signed)
Physical Therapy Evaluation Patient Details Name: Melissa Gonzales MRN: [id number] DOB: 05-28-1941 Today's Date: 12/24/2018   History of Present Illness  Pt adm with chest pain and underwent CABG x 3 on 2/13. PMH - multiple back surgeries, anxiety, depression, glaucoma  Clinical Impression  Pt admitted with above diagnosis and presents to PT with functional limitations due to deficits listed below (See PT problem list). Pt needs skilled PT to maximize independence and safety to allow discharge to ST-SNF prior to return home. Pt lives alone and needs to incr activity tolerance and independence prior to return home.     Follow Up Recommendations SNF;Supervision/Assistance - 24 hour    Equipment Recommendations  None recommended by PT    Recommendations for Other Services       Precautions / Restrictions Precautions Precautions: Fall;Sternal Precaution Booklet Issued: Yes (comment)      Mobility  Bed Mobility               General bed mobility comments: Pt up in chair  Transfers Overall transfer level: Needs assistance Equipment used: 4-wheeled walker Transfers: Sit to/from Stand Sit to Stand: Min assist         General transfer comment: Assist to raise hips and for balance  Ambulation/Gait Ambulation/Gait assistance: Min assist Gait Distance (Feet): 375 Feet Assistive device: 4-wheeled walker Gait Pattern/deviations: Step-through pattern;Decreased stride length;Drifts right/left;Trunk flexed Gait velocity: decr Gait velocity interpretation: <1.31 ft/sec, indicative of household ambulator General Gait Details: Assist for balance and support. Amb on RA with SpO2>92%  Stairs            Wheelchair Mobility    Modified Rankin (Stroke Patients Only)       Balance Overall balance assessment: Needs assistance Sitting-balance support: No upper extremity supported;Feet supported Sitting balance-Leahy Scale: Fair     Standing balance support: No upper  extremity supported Standing balance-Leahy Scale: Fair                               Pertinent Vitals/Pain Pain Assessment: Faces Faces Pain Scale: Hurts a little bit Pain Location: chest incision Pain Descriptors / Indicators: Guarding;Grimacing Pain Intervention(s): Limited activity within patient's tolerance;Monitored during session    Home Living Family/patient expects to be discharged to:: Private residence Living Arrangements: Alone Available Help at Discharge: Family;Available PRN/intermittently Type of Home: House Home Access: Level entry;Elevator     Home Layout: One level Home Equipment: Walker - 2 wheels Additional Comments: Pt has rollator available if needed    Prior Function Level of Independence: Independent with assistive device(s)         Comments: uses walker. History of falls     Hand Dominance   Dominant Hand: Right    Extremity/Trunk Assessment   Upper Extremity Assessment Upper Extremity Assessment: Generalized weakness    Lower Extremity Assessment Lower Extremity Assessment: Generalized weakness       Communication   Communication: No difficulties  Cognition Arousal/Alertness: Awake/alert Behavior During Therapy: Anxious Overall Cognitive Status: Within Functional Limits for tasks assessed                                        General Comments      Exercises     Assessment/Plan    PT Assessment Patient needs continued PT services  PT Problem List Decreased strength;Decreased activity tolerance;Decreased balance;Decreased  mobility;Decreased knowledge of use of DME       PT Treatment Interventions DME instruction;Gait training;Functional mobility training;Therapeutic activities;Therapeutic exercise;Balance training;Patient/family education    PT Goals (Current goals can be found in the Care Plan section)  Acute Rehab PT Goals Patient Stated Goal: return home PT Goal Formulation: With  patient/family Time For Goal Achievement: 01/07/19 Potential to Achieve Goals: Good    Frequency Min 3X/week   Barriers to discharge Decreased caregiver support lives alone    Co-evaluation               AM-PAC PT "6 Clicks" Mobility  Outcome Measure Help needed turning from your back to your side while in a flat bed without using bedrails?: A Little Help needed moving from lying on your back to sitting on the side of a flat bed without using bedrails?: A Little Help needed moving to and from a bed to a chair (including a wheelchair)?: A Little Help needed standing up from a chair using your arms (e.g., wheelchair or bedside chair)?: A Little Help needed to walk in hospital room?: A Little Help needed climbing 3-5 steps with a railing? : A Lot 6 Click Score: 17    End of Session   Activity Tolerance: Patient tolerated treatment well Patient left: in chair;with call bell/phone within reach;with chair alarm set;with family/visitor present Nurse Communication: Mobility status PT Visit Diagnosis: Unsteadiness on feet (R26.81);Other abnormalities of gait and mobility (R26.89);Muscle weakness (generalized) (M62.81);History of falling (Z91.81)    Time: 7915-0569 PT Time Calculation (min) (ACUTE ONLY): 19 min   Charges:   PT Evaluation $PT Eval Moderate Complexity: 1 Mod          [facility] PT Acute Rehabilitation Services Pager [phone] Office [phone]   Angelina Ok [facility] 12/24/2018, 5:12 PM

## 2018-12-24 NOTE — Plan of Care (Signed)
  Problem: Activity: Goal: Ability to tolerate increased activity will improve Outcome: Progressing   Problem: Education: Goal: Knowledge of General Education information will improve Description Including pain rating scale, medication(s)/side effects and non-pharmacologic comfort measures Outcome: Progressing   Problem: Health Behavior/Discharge Planning: Goal: Ability to manage health-related needs will improve Outcome: Progressing   Problem: Clinical Measurements: Goal: Will remain free from infection Outcome: Progressing Goal: Respiratory complications will improve Outcome: Progressing   Problem: Nutrition: Goal: Adequate nutrition will be maintained Outcome: Progressing   Problem: Coping: Goal: Level of anxiety will decrease Outcome: Progressing   Problem: Pain Managment: Goal: General experience of comfort will improve Outcome: Progressing   Problem: Safety: Goal: Ability to remain free from injury will improve Outcome: Progressing   Problem: Education: Goal: Knowledge of disease or condition will improve Outcome: Progressing Goal: Knowledge of the prescribed therapeutic regimen will improve Outcome: Progressing   Problem: Activity: Goal: Risk for activity intolerance will decrease Outcome: Progressing   Problem: Clinical Measurements: Goal: Postoperative complications will be avoided or minimized Outcome: Progressing   Problem: Respiratory: Goal: Respiratory status will improve Outcome: Progressing   Problem: Skin Integrity: Goal: Risk for impaired skin integrity will decrease Outcome: Progressing   Problem: Urinary Elimination: Goal: Ability to achieve and maintain adequate renal perfusion and functioning will improve Outcome: Progressing

## 2018-12-24 NOTE — Progress Notes (Signed)
Patient ID: Melissa Gonzales, female   DOB: 07/06/1941, 78 y.o.   MRN: [id number] EVENING ROUNDS NOTE :     [address]       [place] [address]             [phone]                 5 Days Post-Op Procedure(s) (LRB): CORONARY ARTERY BYPASS GRAFTING (CABG)x3, using left internal mammary arteryand right and left greater saphenous veins harvested endoscopically (N/A) TRANSESOPHAGEAL ECHOCARDIOGRAM (TEE) (N/A)  Total Length of Stay:  LOS: 7 days  BP 124/75   Pulse 91   Temp 97.6 F (36.4 C)   Resp 18   Ht 5\' 4"  (1.626 m)   Wt 76.7 kg   SpO2 100%   BMI 29.02 kg/m   .Intake/Output      02/17 0701 - 02/18 0700 02/18 0701 - 02/19 0700   P.O. 1440    I.V. (mL/kg) 69.4 (0.9) 21.1 (0.3)   IV Piggyback 111.9 68.9   Total Intake(mL/kg) 1621.3 (21.1) 89.9 (1.2)   Urine (mL/kg/hr) 1300 (0.7) 1050 (1.2)   Stool 0 0   Total Output 1300 1050   Net +321.3 -960.1        Urine Occurrence 4 x 2 x   Stool Occurrence 2 x 1 x     . sodium chloride Stopped (12/24/18 1409)     Lab Results  Component Value Date   WBC 6.3 12/24/2018   HGB 8.5 (L) 12/24/2018   HCT 26.0 (L) 12/24/2018   PLT 197 12/24/2018   GLUCOSE 132 (H) 12/24/2018   CHOL 177 12/15/2018   TRIG 116 12/15/2018   HDL 57 12/15/2018   LDLDIRECT 118 (H) 06/02/2008   LDLCALC 97 12/15/2018   ALT 22 12/24/2018   AST 20 12/24/2018   NA 136 12/24/2018   K 3.4 (L) 12/24/2018   CL 103 12/24/2018   CREATININE 0.54 12/24/2018   BUN 7 (L) 12/24/2018   CO2 25 12/24/2018   TSH 5.337 (H) 12/14/2018   INR 1.48 12/19/2018   HGBA1C 5.8 (H) 12/15/2018    Stable day, walked around unit 3 times  Delight Ovens MD  Beeper [phone] Office [phone] 12/24/2018 6:18 PM

## 2018-12-24 NOTE — Progress Notes (Addendum)
TCTS DAILY ICU PROGRESS NOTE                   [address]            [place] [address]          [phone]   5 Days Post-Op Procedure(s) (LRB): CORONARY ARTERY BYPASS GRAFTING (CABG)x3, using left internal mammary arteryand right and left greater saphenous veins harvested endoscopically (N/A) TRANSESOPHAGEAL ECHOCARDIOGRAM (TEE) (N/A)  Total Length of Stay:  LOS: 7 days   Subjective: Only complaint is shortness of breath.   Objective: Vital signs in last 24 hours: Temp:  [97.4 F (36.3 C)-97.7 F (36.5 C)] 97.4 F (36.3 C) (02/18 0338) Pulse Rate:  [86-100] 86 (02/18 0700) Cardiac Rhythm: Normal sinus rhythm (02/18 0400) Resp:  [14-32] 24 (02/18 0700) BP: (72-131)/(50-86) 117/64 (02/18 0700) SpO2:  [86 %-100 %] 100 % (02/18 0815)  Filed Weights   12/21/18 0159 12/22/18 0500 12/23/18 0500  Weight: 76.2 kg 76.2 kg 76.7 kg    Weight change:    Hemodynamic parameters for last 24 hours:    Intake/Output from previous day: 02/17 0701 - 02/18 0700 In: 1621.3 [P.O.:1440; I.V.:69.4; IV Piggyback:111.9] Out: 1300 [Urine:1300]  Intake/Output this shift: No intake/output data recorded.  Current Meds: Scheduled Meds: . acetaminophen  1,000 mg Oral Q6H   Or  . acetaminophen (TYLENOL) oral liquid 160 mg/5 mL  1,000 mg Per Tube Q6H  . aspirin EC  325 mg Oral Daily   Or  . aspirin  324 mg Per Tube Daily  . atorvastatin  80 mg Oral q1800  . bisacodyl  10 mg Oral Daily   Or  . bisacodyl  10 mg Rectal Daily  . brimonidine  1 drop Both Eyes Q12H  . carvedilol  3.125 mg Oral BID WC  . Chlorhexidine Gluconate Cloth  6 each Topical Daily  . docusate sodium  200 mg Oral Daily  . dorzolamide-timolol  1 drop Both Eyes BID  . ezetimibe  10 mg Oral Daily  . feeding supplement (ENSURE ENLIVE)  237 mL Oral TID WC  . furosemide  40 mg Intravenous Daily  . guaiFENesin  600 mg Oral BID  . latanoprost  1 drop Both Eyes QHS  . levothyroxine  25 mcg Oral Q0600  .  mouth rinse  15 mL Mouth Rinse BID  . mometasone-formoterol  2 puff Inhalation BID  . pantoprazole  40 mg Oral Daily  . pentosan polysulfate  100 mg Oral TID  . potassium chloride  20 mEq Oral Q4H  . sodium chloride flush  10-40 mL Intracatheter Q12H  . sodium chloride flush  3 mL Intravenous Q12H  . spiritus frumenti  1 each Oral Q supper  . thiamine  100 mg Intravenous Daily   Continuous Infusions: . sodium chloride Stopped (12/23/18 1416)   PRN Meds:.sodium chloride, ALPRAZolam, ondansetron (ZOFRAN) IV, sodium chloride flush, sodium chloride flush, traMADol, URIBEL  General appearance: alert, cooperative and no distress Heart: regular rate and rhythm, S1, S2 normal, no murmur, click, rub or gallop Lungs: clear to auscultation bilaterally and diminished in the lower lobes Abdomen: soft, non-tender; bowel sounds normal; no masses,  no organomegaly Extremities: extremities normal, atraumatic, no cyanosis or edema Wound: clean and dry  Lab Results: CBC: Recent Labs    12/23/18 0416 12/24/18 0330  WBC 6.8 6.3  HGB 8.4* 8.5*  HCT 25.3* 26.0*  PLT 173 197   BMET:  Recent Labs  12/23/18 0416 12/24/18 0330  NA 137 136  K 3.5 3.4*  CL 106 103  CO2 25 25  GLUCOSE 101* 132*  BUN 9 7*  CREATININE 0.48 0.54  CALCIUM 8.2* 8.2*    CMET: Lab Results  Component Value Date   WBC 6.3 12/24/2018   HGB 8.5 (L) 12/24/2018   HCT 26.0 (L) 12/24/2018   PLT 197 12/24/2018   GLUCOSE 132 (H) 12/24/2018   CHOL 177 12/15/2018   TRIG 116 12/15/2018   HDL 57 12/15/2018   LDLDIRECT 118 (H) 06/02/2008   LDLCALC 97 12/15/2018   ALT 22 12/24/2018   AST 20 12/24/2018   NA 136 12/24/2018   K 3.4 (L) 12/24/2018   CL 103 12/24/2018   CREATININE 0.54 12/24/2018   BUN 7 (L) 12/24/2018   CO2 25 12/24/2018   TSH 5.337 (H) 12/14/2018   INR 1.48 12/19/2018   HGBA1C 5.8 (H) 12/15/2018      PT/INR: No results for input(s): LABPROT, INR in the last 72 hours. Radiology: No results  found.   Assessment/Plan: S/P Procedure(s) (LRB): CORONARY ARTERY BYPASS GRAFTING (CABG)x3, using left internal mammary arteryand right and left greater saphenous veins harvested endoscopically (N/A) TRANSESOPHAGEAL ECHOCARDIOGRAM (TEE) (N/A)  1. CV-BP well controlled, NSR in  80s. Continue ASA, lipitor, and lopressor. Remains on milrinone off coox 58.4.  2. Pulm-tolerating 3L [place]. Continue to use incentive spirometer. Weaning oxygen as tolerated. Ordered CXR. Ordered flutter valve for additional pulm toilet.  3. Renal-creatinine 0.54, potassium 3.4- replace  4. H and H  8.5/26, expected acute blood loss anemia 5. Endo-blood glucose -well controlled 6. Anxiety-she is on xanax 0.25mg  q 8 hours PRN. 7. DVT prophylaxis-SCDs, Lovenox?  Plan: Continue Dulera and pulm toilet. Ambulate as able. Mentation is better today-she is oriented to person and place. OOB to chair.    Melissa Gonzales 12/24/2018 8:20 AM  Showing sig improvement Plan transfer to 4E tomorrow patient examined and medical record reviewed,agree with above note. Lovenox contraindicate with one of her bladder meds for interstitial cystitis Kathlee Nations Trigt III 12/24/2018

## 2018-12-25 ENCOUNTER — Inpatient Hospital Stay (HOSPITAL_COMMUNITY): Payer: Medicare Other

## 2018-12-25 LAB — CBC
HCT: 25.9 % — ABNORMAL LOW (ref 36.0–46.0)
Hemoglobin: 8.3 g/dL — ABNORMAL LOW (ref 12.0–15.0)
MCH: 33.1 pg (ref 26.0–34.0)
MCHC: 32 g/dL (ref 30.0–36.0)
MCV: 103.2 fL — ABNORMAL HIGH (ref 80.0–100.0)
Platelets: 262 10*3/uL (ref 150–400)
RBC: 2.51 MIL/uL — ABNORMAL LOW (ref 3.87–5.11)
RDW: 14.5 % (ref 11.5–15.5)
WBC: 7.2 10*3/uL (ref 4.0–10.5)
nRBC: 0.3 % — ABNORMAL HIGH (ref 0.0–0.2)

## 2018-12-25 LAB — BASIC METABOLIC PANEL
Anion gap: 6 (ref 5–15)
BUN: 6 mg/dL — ABNORMAL LOW (ref 8–23)
CO2: 26 mmol/L (ref 22–32)
Calcium: 8.4 mg/dL — ABNORMAL LOW (ref 8.9–10.3)
Chloride: 105 mmol/L (ref 98–111)
Creatinine, Ser: 0.49 mg/dL (ref 0.44–1.00)
GFR calc Af Amer: >60 mL/min (ref >60)
GFR calc non Af Amer: >60 mL/min (ref >60)
Glucose, Bld: 111 mg/dL — ABNORMAL HIGH (ref 70–99)
Potassium: 3.8 mmol/L (ref 3.5–5.1)
Sodium: 137 mmol/L (ref 135–145)

## 2018-12-25 MED ORDER — INSULIN ASPART 100 UNIT/ML ~~LOC~~ SOLN: 0.0000 [IU] | Freq: Three times a day (TID) | SUBCUTANEOUS | Status: DC

## 2018-12-25 MED ORDER — SODIUM CHLORIDE 0.9% FLUSH: 3.0000 mL | INTRAVENOUS | Status: DC | PRN

## 2018-12-25 MED ORDER — SODIUM CHLORIDE 0.9% FLUSH
3.0000 mL | Freq: Two times a day (BID) | INTRAVENOUS | Status: DC
  Administered 2018-12-25 – 2018-12-28 (×6): 3 mL via INTRAVENOUS

## 2018-12-25 MED ORDER — OXYCODONE HCL 5 MG PO TABS
2.5000 mg | ORAL_TABLET | Freq: Once | ORAL | Status: AC
  Administered 2018-12-25: 2.5 mg via ORAL
  Filled 2018-12-25: qty 1

## 2018-12-25 MED ORDER — SPIRITUS FRUMENTI
1.0000 | Freq: Every day | ORAL | Status: DC
  Administered 2018-12-26: 1 via ORAL
  Filled 2018-12-25 (×4): qty 1

## 2018-12-25 MED ORDER — GABAPENTIN 100 MG PO CAPS
200.0000 mg | ORAL_CAPSULE | Freq: Two times a day (BID) | ORAL | Status: DC
  Administered 2018-12-25 – 2018-12-28 (×7): 200 mg via ORAL
  Filled 2018-12-25 (×7): qty 2

## 2018-12-25 MED ORDER — SODIUM CHLORIDE 0.9 % IV SOLN: 250.0000 mL | INTRAVENOUS | Status: DC | PRN

## 2018-12-25 MED ORDER — MOVING RIGHT ALONG BOOK
Freq: Once | Status: AC
  Administered 2018-12-25: 19:00:00
  Filled 2018-12-25: qty 1

## 2018-12-25 MED ORDER — FUROSEMIDE 40 MG PO TABS
40.0000 mg | ORAL_TABLET | Freq: Every day | ORAL | Status: DC
  Administered 2018-12-25 – 2018-12-28 (×4): 40 mg via ORAL
  Filled 2018-12-25 (×4): qty 1

## 2018-12-25 MED ORDER — FUROSEMIDE 40 MG PO TABS: 40.0000 mg | ORAL_TABLET | Freq: Every day | ORAL | Status: DC

## 2018-12-25 NOTE — Progress Notes (Signed)
Pt received from 2H via wheelchair. Pt given CHG bath. Telebox  12 applied/CCMD notified. Pt vitals stable. Pt denies any complaints. Will continue to monitor.  Lacy Duverney, RN

## 2018-12-25 NOTE — Progress Notes (Signed)
CARDIAC REHAB PHASE I   PRE:  Rate/Rhythm: 89 SR    BP: sitting 121/56    SaO2: 99 RA  MODE:  Ambulation: 470 ft   POST:  Rate/Rhythm: 101 ST    BP: sitting 115/88     SaO2: 99 RA  Pt eager to walk.  Rocked to stand, used RW with standby assist. She needed reminders to stand tall and stay inside RW while walking and esp navigating room. Likes to be independent. Rest x1. Tired toward end of walk. Apparently she had just walked 2H before transfer, which I did not realize. To bed for rest, encouraged IS.  [id number]  [name] CES, ACSM 12/25/2018 2:33 PM

## 2018-12-25 NOTE — Progress Notes (Addendum)
[facility] DAILY ICU PROGRESS NOTE                   [address]            [place] [id number]          [phone]   6 Days Post-Op Procedure(s) (LRB): CORONARY ARTERY BYPASS GRAFTING (CABG)x3, using left internal mammary arteryand right and left greater saphenous veins harvested endoscopically (N/A) TRANSESOPHAGEAL ECHOCARDIOGRAM (TEE) (N/A)  Total Length of Stay:  LOS: 8 days   Subjective: Anxious this morning with pain under her right breast-worse with palpation around her chest tube site.  Objective: Vital signs in last 24 hours: Temp:  [96.7 F (35.9 C)-98.9 F (37.2 C)] 96.7 F (35.9 C) (02/19 0745) Pulse Rate:  [82-96] 93 (02/19 0800) Cardiac Rhythm: Normal sinus rhythm (02/19 0800) Resp:  [14-27] 15 (02/19 0800) BP: (100-145)/(58-76) 130/76 (02/19 0800) SpO2:  [90 %-100 %] 98 % (02/19 0800)  Filed Weights   12/21/18 0159 12/22/18 0500 12/23/18 0500  Weight: 76.2 kg 76.2 kg 76.7 kg    Weight change:       Intake/Output from previous day: 02/18 0701 - 02/19 0700 In: 819.9 [P.O.:720; I.V.:31.1; IV Piggyback:68.9] Out: 2300 [Urine:2300]  Intake/Output this shift: No intake/output data recorded.  Current Meds: Scheduled Meds: . aspirin EC  325 mg Oral Daily   Or  . aspirin  324 mg Per Tube Daily  . atorvastatin  80 mg Oral q1800  . bisacodyl  10 mg Oral Daily   Or  . bisacodyl  10 mg Rectal Daily  . brimonidine  1 drop Both Eyes Q12H  . carvedilol  3.125 mg Oral BID WC  . Chlorhexidine Gluconate Cloth  6 each Topical Daily  . docusate sodium  200 mg Oral Daily  . dorzolamide-timolol  1 drop Both Eyes BID  . ezetimibe  10 mg Oral Daily  . feeding supplement (ENSURE ENLIVE)  237 mL Oral TID WC  . furosemide  40 mg Intravenous Daily  . guaiFENesin  600 mg Oral BID  . latanoprost  1 drop Both Eyes QHS  . levothyroxine  25 mcg Oral Q0600  . mouth rinse  15 mL Mouth Rinse BID  . mometasone-formoterol  2 puff Inhalation BID  . pantoprazole   40 mg Oral Daily  . pentosan polysulfate  100 mg Oral TID  . sodium chloride flush  10-40 mL Intracatheter Q12H  . sodium chloride flush  3 mL Intravenous Q12H  . spiritus frumenti  1 each Oral Q supper  . thiamine  100 mg Oral Daily   Continuous Infusions: . sodium chloride Stopped (12/24/18 1409)   PRN Meds:.sodium chloride, ALPRAZolam, ondansetron (ZOFRAN) IV, sodium chloride flush, sodium chloride flush, traMADol, URIBEL  General appearance: alert, cooperative and no distress Heart: regular rate and rhythm, S1, S2 normal, no murmur, click, rub or gallop Lungs: clear to auscultation bilaterally and diminished in the lower lobes Abdomen: soft, non-tender; bowel sounds normal; no masses,  no organomegaly Extremities: 1+ pitting edema in lower extremity Wound: clean and dry  Lab Results: CBC: Recent Labs    12/24/18 0330 12/25/18 0417  WBC 6.3 7.2  HGB 8.5* 8.3*  HCT 26.0* 25.9*  PLT 197 262   BMET:  Recent Labs    12/24/18 0330 12/25/18 0417  NA 136 137  K 3.4* 3.8  CL 103 105  CO2 25 26  GLUCOSE 132* 111*  BUN 7* 6*  CREATININE 0.54 0.49  CALCIUM 8.2* 8.4*    CMET: Lab Results  Component Value Date   WBC 7.2 12/25/2018   HGB 8.3 (L) 12/25/2018   HCT 25.9 (L) 12/25/2018   PLT 262 12/25/2018   GLUCOSE 111 (H) 12/25/2018   CHOL 177 12/15/2018   TRIG 116 12/15/2018   HDL 57 12/15/2018   LDLDIRECT 118 (H) 06/02/2008   LDLCALC 97 12/15/2018   ALT 22 12/24/2018   AST 20 12/24/2018   NA 137 12/25/2018   K 3.8 12/25/2018   CL 105 12/25/2018   CREATININE 0.49 12/25/2018   BUN 6 (L) 12/25/2018   CO2 26 12/25/2018   TSH 5.337 (H) 12/14/2018   INR 1.48 12/19/2018   HGBA1C 5.8 (H) 12/15/2018      PT/INR: No results for input(s): LABPROT, INR in the last 72 hours. Radiology: No results found.   Assessment/Plan: S/P Procedure(s) (LRB): CORONARY ARTERY BYPASS GRAFTING (CABG)x3, using left internal mammary arteryand right and left greater saphenous veins  harvested endoscopically (N/A) TRANSESOPHAGEAL ECHOCARDIOGRAM (TEE) (N/A)   1. CV-BP well controlled, NSR in 90s. Continue ASA, lipitor, and coreg.  2. Pulm-tolerating 2L [place]. Continue to use incentive spirometer. Weaning oxygen as tolerated. CXR showed small bilateral pleural effusions. Ordered flutter valve for additional pulm toilet.  3. Renal-creatinine 0.49, potassium 3.8 4. H and H 8.3/25.9, expected acute blood loss anemia 5. Endo-blood glucose -well controlled 6. Anxiety-she is on xanax 0.25mg  q 8 hours PRN. 7. DVT prophylaxis-SCDs, Lovenox contraindicated due to bladder medication.  Plan: One time low-dose oxycodone for pain this morning. She is taking shallow breaths due to pain. Otherwise, per nursing her breathing has been better and she can tolerate room air with good saturations however, the patient prefers oxygen. Encouraged ambulation in the halls and she was able to do three laps around the unit yesterday. Possible transfer later today.     Sharlene Dory 12/25/2018 8:31 AM Pain and anxiety remain an issue limiting her recovery.  She is on her preoperative dose of Xanax. We will add low-dose Neurontin for neuritic type pain over right chest Patient ready to transfer to stepdown and I feel that a change help her attitude and reassure her anxiety recovering from heart surgery patient examined and medical record reviewed,agree with above note. Kathlee Nations Trigt III 12/25/2018

## 2018-12-26 ENCOUNTER — Inpatient Hospital Stay (HOSPITAL_COMMUNITY): Payer: Medicare Other

## 2018-12-26 LAB — BASIC METABOLIC PANEL
Anion gap: 9 (ref 5–15)
BUN: 7 mg/dL — ABNORMAL LOW (ref 8–23)
CO2: 27 mmol/L (ref 22–32)
Calcium: 8.4 mg/dL — ABNORMAL LOW (ref 8.9–10.3)
Chloride: 101 mmol/L (ref 98–111)
Creatinine, Ser: 0.54 mg/dL (ref 0.44–1.00)
GFR calc Af Amer: >60 mL/min (ref >60)
GFR calc non Af Amer: >60 mL/min (ref >60)
Glucose, Bld: 114 mg/dL — ABNORMAL HIGH (ref 70–99)
Potassium: 3.6 mmol/L (ref 3.5–5.1)
Sodium: 137 mmol/L (ref 135–145)

## 2018-12-26 LAB — CBC
HCT: 25.4 % — ABNORMAL LOW (ref 36.0–46.0)
Hemoglobin: 8.2 g/dL — ABNORMAL LOW (ref 12.0–15.0)
MCH: 33.6 pg (ref 26.0–34.0)
MCHC: 32.3 g/dL (ref 30.0–36.0)
MCV: 104.1 fL — ABNORMAL HIGH (ref 80.0–100.0)
Platelets: 285 10*3/uL (ref 150–400)
RBC: 2.44 MIL/uL — ABNORMAL LOW (ref 3.87–5.11)
RDW: 14.7 % (ref 11.5–15.5)
WBC: 6.9 10*3/uL (ref 4.0–10.5)
nRBC: 0.4 % — ABNORMAL HIGH (ref 0.0–0.2)

## 2018-12-26 MED ORDER — POTASSIUM CHLORIDE CRYS ER 20 MEQ PO TBCR
20.0000 meq | EXTENDED_RELEASE_TABLET | Freq: Every day | ORAL | Status: DC
  Administered 2018-12-26 – 2018-12-28 (×3): 20 meq via ORAL
  Filled 2018-12-26 (×3): qty 1

## 2018-12-26 NOTE — Progress Notes (Signed)
Pt ambulated x 470 feet around unit, pt tolerated well  °

## 2018-12-26 NOTE — Care Management Important Message (Signed)
Important Message  Patient Details  Name: Melissa Gonzales MRN: [id number] Date of Birth: Aug 16, 1941   Medicare Important Message Given:  Yes    Joshue Badal P Jaleiah Asay 12/26/2018, 11:14 AM

## 2018-12-26 NOTE — Progress Notes (Signed)
epicardial pacing wires removed per order. Pt tolerated well. VSS. Pt instructed to stay on bedrest x1 hour. Pt voiced understanding. Will continue to monitor.  Theophilus Kinds, RN

## 2018-12-26 NOTE — Progress Notes (Signed)
CARDIAC REHAB PHASE I   PRE:  Rate/Rhythm: 88 SR  BP:  Sitting: 116/56      SaO2: 100 RA  MODE:  Ambulation: 470 ft 103 peak HR  POST:  Rate/Rhythm: 92 SR  BP:  Sitting: 125/65    SaO2: 97 RA   Pt ambulated 461ft in hallway standby assist with front wheel walker. Pt c/o SOB, sats maintained throughout the walk, pt admits to anxiousness. Removed purewick from pt and encouraged pt to get up and walk to bathroom as needed. Also encouraged IS use. Will continue to follow.  [id number] Reynold Bowen, RN BSN 12/26/2018 2:16 PM

## 2018-12-26 NOTE — Progress Notes (Addendum)
[address]       [place] [address]             [phone]      7 Days Post-Op Procedure(s) (LRB): CORONARY ARTERY BYPASS GRAFTING (CABG)x3, using left internal mammary arteryand right and left greater saphenous veins harvested endoscopically (N/A) TRANSESOPHAGEAL ECHOCARDIOGRAM (TEE) (N/A) Subjective: Feels better, ambulation improving  Objective: Vital signs in last 24 hours: Temp:  [97.6 F (36.4 C)-98.7 F (37.1 C)] 97.6 F (36.4 C) (02/20 0838) Pulse Rate:  [85-94] 91 (02/20 0838) Cardiac Rhythm: Normal sinus rhythm (02/20 0700) Resp:  [17-26] 19 (02/20 0838) BP: (105-133)/(56-75) 133/64 (02/20 0838) SpO2:  [97 %-100 %] 99 % (02/20 0843) Weight:  [73.8 kg] 73.8 kg (02/20 0305)  Hemodynamic parameters for last 24 hours:    Intake/Output from previous day: 02/19 0701 - 02/20 0700 In: 440 [P.O.:440] Out: 0  Intake/Output this shift: No intake/output data recorded.  General appearance: alert, cooperative and no distress Heart: regular rate and rhythm Lungs: mildly dim in bases  Abdomen: soft, non tender Extremities: + LE edema Wound: incis healing well  Lab Results: Recent Labs    12/25/18 0417 12/26/18 0320  WBC 7.2 6.9  HGB 8.3* 8.2*  HCT 25.9* 25.4*  PLT 262 285   BMET:  Recent Labs    12/25/18 0417 12/26/18 0320  NA 137 137  K 3.8 3.6  CL 105 101  CO2 26 27  GLUCOSE 111* 114*  BUN 6* 7*  CREATININE 0.49 0.54  CALCIUM 8.4* 8.4*    PT/INR: No results for input(s): LABPROT, INR in the last 72 hours. ABG    Component Value Date/Time   PHART 7.389 12/20/2018 0040   HCO3 22.6 12/20/2018 0040   TCO2 24 12/20/2018 0040   ACIDBASEDEF 2.0 12/20/2018 0040   O2SAT 58.4 12/24/2018 0420   CBG (last 3)  No results for input(s): GLUCAP in the last 72 hours.  Meds Scheduled Meds: . aspirin EC  325 mg Oral Daily   Or  . aspirin  324 mg Per Tube Daily  . atorvastatin  80 mg Oral q1800  . bisacodyl  10 mg Oral Daily   Or  . bisacodyl  10 mg Rectal Daily  . brimonidine  1 drop Both Eyes Q12H  . carvedilol  3.125 mg Oral BID WC  . Chlorhexidine Gluconate Cloth  6 each Topical Daily  . docusate sodium  200 mg Oral Daily  . dorzolamide-timolol  1 drop Both Eyes BID  . ezetimibe  10 mg Oral Daily  . feeding supplement (ENSURE ENLIVE)  237 mL Oral TID WC  . furosemide  40 mg Oral Daily  . gabapentin  200 mg Oral BID  . guaiFENesin  600 mg Oral BID  . latanoprost  1 drop Both Eyes QHS  . levothyroxine  25 mcg Oral Q0600  . mometasone-formoterol  2 puff Inhalation BID  . pantoprazole  40 mg Oral Daily  . pentosan polysulfate  100 mg Oral TID  . sodium chloride flush  3 mL Intravenous Q12H  . spiritus frumenti  1 each Oral Q supper  . thiamine  100 mg Oral Daily   Continuous Infusions: . sodium chloride Stopped (12/24/18 1409)  . sodium chloride     PRN Meds:.sodium chloride, sodium chloride, ALPRAZolam, ondansetron (ZOFRAN) IV, sodium chloride flush, traMADol, URIBEL  Xrays Dg Chest 2 View  Result Date: 12/26/2018 CLINICAL DATA:  78 year old female with a history of CABG  EXAM: CHEST - 2 VIEW COMPARISON:  12/25/2018 FINDINGS: Cardiomediastinal silhouette unchanged in size and contour. Surgical changes of median sternotomy. Unchanged right upper extremity PICC with the tip terminating at the superior cavoatrial junction. Epicardial pacing leads remain, projecting over the upper abdomen. Similar appearance of asymmetric elevation the right hemidiaphragm. Similar appearance of linear opacities at the bilateral lung bases with blunting of the bilateral costophrenic angles. No pneumothorax. IMPRESSION: Similar appearance of the chest x-ray with low lung volumes, likely basilar atelectasis and small pleural effusions. Surgical changes of median sternotomy and CABG. Unchanged right upper extremity PICC. Epicardial pacing leads remain. Electronically Signed   By: Gilmer Mor D.O.   On: 12/26/2018 08:53   Dg Chest  2 View  Result Date: 12/25/2018 CLINICAL DATA:  Post CABG, soreness EXAM: CHEST - 2 VIEW COMPARISON:  12/23/2018 FINDINGS: RIGHT arm PICC line tip projects over cavoatrial junction. Minimal enlargement of cardiac silhouette post CABG. Atherosclerotic calcification aorta. Mediastinal contours and pulmonary vascularity normal. Bibasilar atelectasis and small BILATERAL pleural effusions. Persistent elevation of RIGHT diaphragm. Upper lungs clear. No segmental consolidation or pneumothorax. Bones demineralized. IMPRESSION: Persistent bibasilar atelectasis and small pleural effusions. Electronically Signed   By: Ulyses Southward M.D.   On: 12/25/2018 09:49    Assessment/Plan: S/P Procedure(s) (LRB): CORONARY ARTERY BYPASS GRAFTING (CABG)x3, using left internal mammary arteryand right and left greater saphenous veins harvested endoscopically (N/A) TRANSESOPHAGEAL ECHOCARDIOGRAM (TEE) (N/A)  1 steady overall progress 2 sinus rhythm with good BP control 3 good sats on RA- cont pulm toilet 4 anemia is stable, macrocytic  5 no leukocytosis or fevers 6 renal fxn normal. With mod volume overload- cont diuresis 7 cont cardiac rehab, she is hoping not to need a facility- has friends and family around who can help her out 8 d/c epw's   LOS: 9 days    Rowe Clack  PA-C 12/26/2018 Pager [phone]  SNF rec by PT Consult to Social worker for SNF placement 10 lbs over preop wt- cont lasix  patient examined and medical record reviewed,agree with above note. Kathlee Nations Trigt III 12/26/2018

## 2018-12-27 MED ORDER — PHENOL 1.4 % MT LIQD: 1.0000 | OROMUCOSAL | Status: DC | PRN

## 2018-12-27 MED ORDER — MENTHOL 3 MG MT LOZG
1.0000 | LOZENGE | OROMUCOSAL | Status: DC | PRN
  Administered 2018-12-27: 3 mg via ORAL
  Filled 2018-12-27: qty 9

## 2018-12-27 NOTE — Discharge Instructions (Signed)

## 2018-12-27 NOTE — Progress Notes (Signed)
VAST RN consulted to remove PICC if PIV obtainable. VAST RN called unit and spoke with pt's nurse, Nehemiah Settle. She stated the line was not being used. VAST RN discussed with Nehemiah Settle that although no fluids or meds are being given, labs are being drawn from this line every am. Educated each time we stick pt we increase risk for infection. Nehemiah Settle stated she had overlooked the lab draws and verbalized the line should stay in place.

## 2018-12-27 NOTE — Discharge Summary (Addendum)
Physician Discharge Summary        [address] [place].[address]       [place] [address]             [phone]     Patient ID: Melissa Gonzales MRN: [id number] DOB/AGE: Mar 31, 1941 78 y.o.  Admit date: 12/14/2018 Discharge date: 12/28/2018  Admission Diagnoses: Patient Active Problem List   Diagnosis Date Noted  . Coronary artery disease 12/17/2018  . Coronary artery calcification   . Chest pain 12/15/2018  . Palpitations 12/15/2018  . Right foot pain 12/04/2017  . Community acquired pneumonia 10/26/2015  . Parapneumonic effusion   . Intractable back pain 04/09/2015  . Intractable pain 04/08/2015  . S/P lumbar spinal fusion 11/27/2014  . Bilateral shoulder pain 09/02/2014  . Low back pain 09/02/2014  . Axillary pain 07/12/2014  . Wellness examination 05/22/2014  . Insomnia 05/14/2013  . Preventive measure 04/15/2012  . Depressive disorder 01/23/2012  . Urge incontinence 01/23/2012  . ARTHRITIS, HANDS, BILATERAL 03/25/2009  . HYPERCHOLESTEROLEMIA 02/27/2008  . MIGRAINE HEADACHE 02/27/2008  . Essential hypertension 02/27/2008  . Anxiety state 08/01/2007    Discharge Diagnoses:  Active Problems:   HYPERCHOLESTEROLEMIA   Essential hypertension   Chest pain   Palpitations   Coronary artery calcification   Coronary artery disease   S/P CABG x 3   Discharged Condition: good   HPI:    The patient is a 78 year old female who presented to the emergency department on 12/15/2018 with a chief complaint of palpitations.  She noted the symptoms for approximately 3 days and they began to occur more frequently.  On the date of presentation it was associated with chest pain with radiation into her left arm.  Additionally she noted shortness of breath and called 911.  Upon arrival of EMS the symptoms had resolved although she did feel weak with a sense of lightheadedness.  Patient has no prior history of coronary artery disease.  She does have risk factors including  hyperlipidemia and hypertension.  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  A cardiac CT scan with coronary score was dramatically elevated with a calcium score of 3,220 which was in the 99th percentile for age and sex match control.  She also had findings consistent with three-vessel disease.  Cardiology consultation was obtained.  They felt she should undergo cardiac catheterization which was done on today's date.  The full report is listed below.  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  She has no history of diabetes but her hemoglobin A1c is noted to be elevated at 5.8.  Additionally her TSH is elevated at 5.337.  She has been started on Synthroid.  Her current lipid panel is fairly unremarkable with LDL-C at 97 with an excellent triglyceride to HDL ratio.  She is on current high-dose statin dosing.  Her troponins were not elevated x4.  Her serum folate level is greater than 620.  The patient does state that she has been under significant stress lately.  A 2D echocardiogram showed EF greater than 65%.  The report is listed below.  A CT angiogram was also obtained during her evaluation and showed no PE.  It did show some mild scarring versus  atelectasis in the mid to lower lungs bilaterally.  A lower extremity venous duplex was also negative.   Hospital Course:   On 12/21/2018 Ms. Muff underwent a current bypass grafting x3 with Dr. Donata Clay.  She tolerated procedure well was transferred to the surgical ICU in stable condition.  She was extubated timely manner.  Postop day 1 she did have some abdominal cramping which was relieved with a bowel movement.  She remained above  her preoperative weight therefore we started her on IV Lasix for fluid overload.  She was maintaining normal sinus rhythm.  We weaned her off of dopamine and continue low-dose milrinone.  We continued her preoperative Xanax for anxiety.  Her anxiety continued in the ICU and she had some periods of confusion.  We decreased her narcotic pain medication.  We also were able to discontinue her milrinone.  We consulted physical therapy to assist with ambulation and mobility.  We continued her Dulera and she continues to make slow progress with her pulmonary status.  Her mentation did improve over time and she was oriented to person and place.  She remained on 3 L nasal cannula and we were weaning as able.  We encouraged use of her incentive spirometer and flutter valve.  On postop day 6 she was stable to transfer to the stepdown unit for continued care.  We added low-dose Neurontin for her pain.  She was having some pain under her right breast that became worse with coughing and sneezing.  She did have some small bilateral pleural effusions on chest x-ray.  We continued a diuretic regimen for fluid overload.  We were weaning her oxygen support as tolerated.  We encouraged her to ambulate in the halls as much as possible. She continued to improve on the floor. She was weaned off her oxygen. Today, she is stable to discharge to SNF.    Consults: None  Significant Diagnostic Studies:   CLINICAL DATA:  78 year old female with a history of CABG  EXAM: CHEST - 2 VIEW  COMPARISON:  12/25/2018  FINDINGS: Cardiomediastinal silhouette unchanged in size and contour. Surgical changes of median sternotomy. Unchanged right upper extremity PICC with the tip terminating at the superior cavoatrial junction.  Epicardial pacing leads remain, projecting over the upper abdomen.  Similar appearance of asymmetric elevation the right hemidiaphragm.  Similar appearance of linear opacities at the bilateral lung  bases with blunting of the bilateral costophrenic angles.  No pneumothorax.  IMPRESSION: Similar appearance of the chest x-ray with low lung volumes, likely basilar atelectasis and small pleural effusions.  Surgical changes of median sternotomy and CABG.  Unchanged right upper extremity PICC.  Epicardial pacing leads remain.   Electronically Signed   By: Gilmer Mor D.O.   On: 12/26/2018 08:53   Treatments:   NAME: SAMAURIA, GLAAB MEDICAL RECORD MD:[id number] ACCOUNT [id number] DATE OF BIRTH:[birth year] FACILITY: [facility] LOCATION: [facility] PHYSICIAN:Dixon Luczak VAN TRIGT III, MD  OPERATIVE REPORT   DATE OF PROCEDURE:  12/19/2018  OPERATION: 1.  Coronary artery bypass grafting x3 (left internal mammary artery to left anterior descending, saphenous vein graft to obtuse marginal 2, saphenous vein graft to posterior descending). 2.  Bilateral leg endoscopic saphenous vein harvest.  SURGEON:  Kerin Perna III, MD  ASSISTANT:  Jari Favre PA-C   PREOPERATIVE DIAGNOSES:  Severe 3-vessel coronary artery disease, unstable angina, preserved left ventricular systolic function, hypertension.  POSTOPERATIVE DIAGNOSES:  Severe 3-vessel coronary artery disease, unstable angina, preserved ventricular systolic function, hypertension.   Discharge  Exam: Blood pressure (!) 114/58, pulse 80, temperature 98.2 F (36.8 C), temperature source Oral, resp. rate 19, height 5\' 4"  (1.626 m), weight 72 kg, SpO2 100 %.    General appearance: alert, cooperative and no distress Heart: regular rate and rhythm, S1, S2 normal, no murmur, click, rub or gallop Lungs: clear to auscultation bilaterally Abdomen: soft, non-tender; bowel sounds normal; no masses,  no organomegaly Extremities: extremities normal, atraumatic, no cyanosis or edema Wound: clean and dry   Disposition: Discharge disposition: 03-Skilled Nursing Facility       Discharge Instructions    Amb Referral to  Cardiac Rehabilitation   Complete by:  As directed    Diagnosis:  CABG   CABG X ___:  3     Allergies as of 12/28/2018   No Known Allergies     Medication List    STOP taking these medications   azithromycin 500 MG tablet Commonly known as:  ZITHROMAX   benzonatate 200 MG capsule Commonly known as:  TESSALON   cefpodoxime 200 MG tablet Commonly known as:  VANTIN   diclofenac sodium 1 % Gel Commonly known as:  VOLTAREN   Fish Oil 1000 MG Caps   hydrochlorothiazide 12.5 MG capsule Commonly known as:  MICROZIDE   lidocaine 5 % Commonly known as:  LIDODERM   lisinopril 20 MG tablet Commonly known as:  PRINIVIL,ZESTRIL   nitrofurantoin 100 MG capsule Commonly known as:  MACRODANTIN   oxybutynin 10 MG 24 hr tablet Commonly known as:  DITROPAN-XL   oxyCODONE 5 MG immediate release tablet Commonly known as:  Oxy IR/ROXICODONE   sertraline 100 MG tablet Commonly known as:  ZOLOFT   tiZANidine 4 MG tablet Commonly known as:  ZANAFLEX     TAKE these medications   ALPHAGAN P 0.1 % Soln Generic drug:  brimonidine Place 1 drop into both eyes every 12 (twelve) hours.   ALPRAZolam 0.25 MG tablet Commonly known as:  XANAX TAKE 1 TABLET BY MOUTH TWICE DAILY AS NEEDED FOR ANXIETY What changed:    reasons to take this  additional instructions   aspirin 325 MG EC tablet Take 1 tablet (325 mg total) by mouth daily. Start taking on:  December 29, 2018   atorvastatin 80 MG tablet Commonly known as:  LIPITOR Take 80 mg by mouth daily. What changed:  Another medication with the same name was removed. Continue taking this medication, and follow the directions you see here.   CALCIUM 600 + D PO Take 1 tablet by mouth daily.   carvedilol 3.125 MG tablet Commonly known as:  COREG Take 1 tablet (3.125 mg total) by mouth 2 (two) times daily with a meal.   dorzolamide-timolol 22.3-6.8 MG/ML ophthalmic solution Commonly known as:  COSOPT Place 1 drop into both eyes 2  (two) times daily.   ELMIRON 100 MG capsule Generic drug:  pentosan polysulfate Take 100 mg by mouth 3 (three) times daily.   esomeprazole 40 MG capsule Commonly known as:  NEXIUM Take 1 capsule (40 mg total) by mouth daily before breakfast.   furosemide 40 MG tablet Commonly known as:  LASIX Take 1 tablet (40 mg total) by mouth daily for 5 days. Start taking on:  December 29, 2018   gabapentin 100 MG capsule Commonly known as:  NEURONTIN Take 2 capsules (200 mg total) by mouth 2 (two) times daily.   guaiFENesin 600 MG 12 hr tablet Commonly known as:  MUCINEX Take 1 tablet (600 mg total) by mouth 2 (two)  times daily for 3 days.   levothyroxine 25 MCG tablet Commonly known as:  SYNTHROID, LEVOTHROID Take 1 tablet (25 mcg total) by mouth daily at 6 (six) AM. Start taking on:  December 29, 2018   multivitamin with minerals tablet Take 1 tablet by mouth daily.   Netarsudil-Latanoprost 0.02-0.005 % Soln Place 1 drop into both eyes at bedtime.   potassium chloride SA 20 MEQ tablet Commonly known as:  K-DUR,KLOR-CON Take 1 tablet (20 mEq total) by mouth daily for 5 days. Start taking on:  December 29, 2018   traMADol 50 MG tablet Commonly known as:  ULTRAM Take 1 tablet (50 mg total) by mouth every 6 (six) hours as needed for moderate pain.   URIBEL 118 MG Caps Take 1 capsule by mouth 2 (two) times daily as needed (for pain).   vitamin B-12 100 MCG tablet Commonly known as:  CYANOCOBALAMIN Take 100 mcg by mouth daily.      Follow-up Information    Rodrigo Ran, MD. Call in 1 day(s).   Specialty:  Internal Medicine Contact information: [address] [place] [address] [phone]        Chilton Si, MD .   Specialty:  Cardiology Contact information: [address] [place] [address] [place] [address] [phone]        [facility] [place] Follow up.   Specialty:  Cardiothoracic Surgery Why:  Your appointment  is on 01/28/2019 at 1:00pm please arrive at 12:30pm for a chest xray located at [facility] which is on the first floor of our building.  Contact information: [address] [place], [address] [place] [address] [phone]       Abelino Derrick, PA-C Follow up.   Specialties:  Cardiology, Radiology Why:  [facility] 3/11 @8am  ([facility])  Contact information: [address] [place] [address] [phone]          The patient has been discharged on:   1.Beta Blocker:  Yes [ yes  ]                              No   [   ]                              If No, reason:  2.Ace Inhibitor/ARB: Yes [   ]                                     No  [ no   ]                                     If No, reason: titration of BB  3.Statin:   Yes [ yes  ]                  No  [   ]                  If No, reason:  4.Ecasa:  Yes  [  yes ]                  No   [   ]  If No, reason:    Signed: Sharlene Doryessa N Conte 12/28/2018, 12:41 PM   patient examined and medical record reviewed,agree with above note. Kathlee Nationseter Van Trigt III 12/31/2018

## 2018-12-27 NOTE — Progress Notes (Signed)
CARDIAC REHAB PHASE I   PRE:  Rate/Rhythm: 92 SR    BP: sitting 118/66    SaO2: 97 RA  MODE:  Ambulation: 470 ft   POST:  Rate/Rhythm: 104 ST    BP: sitting 129/70     SaO2: 98 RA  Moving well with RW. Steady, no c/o. Ed completed with pt and friend. Will refer to [place] [facility]. [id number]   [name] CES, ACSM 12/27/2018 2:50 PM

## 2018-12-27 NOTE — Progress Notes (Addendum)
      [address]       [place] [address]             [phone]      8 Days Post-Op Procedure(s) (LRB): CORONARY ARTERY BYPASS GRAFTING (CABG)x3, using left internal mammary arteryand right and left greater saphenous veins harvested endoscopically (N/A) TRANSESOPHAGEAL ECHOCARDIOGRAM (TEE) (N/A) Subjective: Feels a little anxious about leaving the hospital. She is asking if she still gets wine tonight because she looks forward to it.  Objective: Vital signs in last 24 hours: Temp:  [97.6 F (36.4 C)-98.1 F (36.7 C)] 97.8 F (36.6 C) (02/21 0507) Pulse Rate:  [81-92] 92 (02/21 0507) Cardiac Rhythm: Normal sinus rhythm (02/21 0700) Resp:  [16-22] 22 (02/21 0507) BP: (100-143)/(52-77) 103/62 (02/21 0507) SpO2:  [92 %-99 %] 92 % (02/21 0507) Weight:  [73.7 kg] 73.7 kg (02/21 0507)     Intake/Output from previous day: 02/20 0701 - 02/21 0700 In: 720 [P.O.:720] Out: 1550 [Urine:1550] Intake/Output this shift: No intake/output data recorded.  General appearance: alert, cooperative and no distress Heart: regular rate and rhythm, S1, S2 normal, no murmur, click, rub or gallop Lungs: clear to auscultation bilaterally Abdomen: soft, non-tender; bowel sounds normal; no masses,  no organomegaly Extremities: extremities normal, atraumatic, no cyanosis or edema Wound: clean and dry  Lab Results: Recent Labs    12/25/18 0417 12/26/18 0320  WBC 7.2 6.9  HGB 8.3* 8.2*  HCT 25.9* 25.4*  PLT 262 285   BMET:  Recent Labs    12/25/18 0417 12/26/18 0320  NA 137 137  K 3.8 3.6  CL 105 101  CO2 26 27  GLUCOSE 111* 114*  BUN 6* 7*  CREATININE 0.49 0.54  CALCIUM 8.4* 8.4*    PT/INR: No results for input(s): LABPROT, INR in the last 72 hours. ABG    Component Value Date/Time   PHART 7.389 12/20/2018 0040   HCO3 22.6 12/20/2018 0040   TCO2 24 12/20/2018 0040   ACIDBASEDEF 2.0 12/20/2018 0040   O2SAT 58.4 12/24/2018 0420   CBG (last 3)  No results  for input(s): GLUCAP in the last 72 hours.  Assessment/Plan: S/P Procedure(s) (LRB): CORONARY ARTERY BYPASS GRAFTING (CABG)x3, using left internal mammary arteryand right and left greater saphenous veins harvested endoscopically (N/A) TRANSESOPHAGEAL ECHOCARDIOGRAM (TEE) (N/A)  1. CV-NSR in the 90s, BP well controlled. Continue current medications 2. Pulm-tolerating room air with good oxygen saturation 3. Renal-creatinine 0.54, electrolytes okay. Continue lasix 40mg  PO. Remains fluid overloaded- 10 lbs over baseline weight.  4. H and H 8.2/25.4, expected acute blood loss anemia. 285k platelets 5. Endo-blood glucose well controlled 6. Consult placed to social work for SNF placement.  Plan: Waiting on insurance authorization for SNF. Throat is sore so ordered some throat spray and lozenges. Continue diuretics for fluid overload.    LOS: 10 days    Sharlene Dory 12/27/2018  Medically ready to DC to SNF- [facility] in [place] [facility] instructions reviewed on activity,meds,wound care patient examined and medical record reviewed,agree with above note. Kathlee Nations Trigt III 12/27/2018

## 2018-12-27 NOTE — Clinical Social Work Note (Signed)
Clinical Social Work Assessment  Patient Details  Name: Melissa Gonzales MRN: [id number] Date of Birth: 08-20-1941  Date of referral:  12/27/18               Reason for consult:  Facility Placement                Permission sought to share information with:  Facility Medical sales representative, Family Supports Permission granted to share information::  Yes, Verbal Permission Granted  Name::     Melissa Gonzales   Agency::  SNFs   Relationship::  daughter  Contact Information:  [phone]  Housing/Transportation Living arrangements for the past 2 months:  Single Family Home Source of Information:  Adult Children Patient Interpreter Needed:  None Criminal Activity/Legal Involvement Pertinent to Current Situation/Hospitalization:  No - Comment as needed Significant Relationships:  None Lives with:  Self Do you feel safe going back to the place where you live?  No(daughter believes she needs ST rehab at [facility]) Need for family participation in patient care:  Yes (Comment)  Care giving concerns:  CSW received consult for discharge needs. CSW spoke with patient's daughter regarding PT recommendation of SNF placement at time of discharge. Patient's daughter sates the patient lives home alone and she works during the day and travel and would be able to be there with the patient.    Social Worker assessment / plan:  CSW spoke with patient's daughter  concerning possibility of rehab at [facility] before returning home. CSW explained the patient has been ambulating over 400 feet and could denied by insurance. Patient's daughter want to seek insurance approval and states if she is denied she may consider private pay for rehab at SNF.    Employment status:    Insurance informationAdvertising account executive PT Recommendations:  Skilled Nursing Facility Information / Referral to community resources:  Skilled Nursing Facility  Patient/Family's Response to care:  Patient's family would like for the patient to receive  additional rehab at [facility]. Patient reported preference for [facility].   Patient/Family's Understanding of and Emotional Response to Diagnosis, Current Treatment, and Prognosis:  Patient's family is realistic regarding therapy needs and expressed being hopeful for SNF placement. Patient expressed understanding of CSW role and discharge process as well as medical condition. No questions/concerns about plan or treatment at this time.   Emotional Assessment Appearance:  Other (Comment Required(unable to assess) Attitude/Demeanor/Rapport:  Unable to Assess Affect (typically observed):  Unable to Assess Orientation:  Oriented to Self, Oriented to Place, Oriented to  Time, Oriented to Situation Alcohol / Substance use:  Not Applicable Psych involvement (Current and /or in the community):  No (Comment)  Discharge Needs  Concerns to be addressed:  Care Coordination Readmission within the last 30 days:  No Current discharge risk:  Dependent with Mobility, Lives alone Barriers to Discharge:  Continued Medical Work up   [employer], LCSWA 12/27/2018, 5:33 PM

## 2018-12-27 NOTE — Progress Notes (Signed)
Physical Therapy Treatment Patient Details Name: Melissa Gonzales MRN: [id number] DOB: 1941/10/17 Today's Date: 12/27/2018    History of Present Illness Pt adm with chest pain and underwent CABG x 3 on 2/13. PMH - multiple back surgeries, anxiety, depression, glaucoma    PT Comments    Patient is progressing very well towards their physical therapy goals. Ambulating limited hallway distances with and without walker and min guard assist. Continues to require up to min assist for transfers. Gait speed of 1.7 ft/s indicates pt at high risk of falls in addition to 5 Times Sit to Stand time of 22.08 s (norm: 12 seconds). SNF remains appropriate given deficits and decreased caregiver support. Pt daughter works full time and is unable to provide 24/7 assistance.     Follow Up Recommendations  SNF;Supervision/Assistance - 24 hour     Equipment Recommendations  None recommended by PT    Recommendations for Other Services       Precautions / Restrictions Precautions Precautions: Fall;Sternal Precaution Booklet Issued: Yes (comment) Restrictions Weight Bearing Restrictions: Yes(sternal prec)    Mobility  Bed Mobility Overal bed mobility: Modified Independent             General bed mobility comments: HOB up with heavy use of bed rail  Transfers Overall transfer level: Needs assistance Equipment used: Rolling walker (2 wheeled) Transfers: Sit to/from Stand Sit to Stand: Min assist         General transfer comment: up to min assist for lifting from edge of bed without using hands   Ambulation/Gait Ambulation/Gait assistance: Min guard Gait Distance (Feet): 200 Feet(x2) Assistive device: Rolling walker (2 wheeled);None Gait Pattern/deviations: Step-through pattern;Decreased stride length;Decreased step length - right Gait velocity: 1.7 ft/s Gait velocity interpretation: <1.8 ft/sec, indicate of risk for recurrent falls General Gait Details: Pt ambulating first 200 feet  with RW then last 200 feet with no assistive device, requiring standing rest break in between. Mild dynamic balance deficits noted without assistive device and slower gait speed   Stairs             Wheelchair Mobility    Modified Rankin (Stroke Patients Only)       Balance Overall balance assessment: Needs assistance Sitting-balance support: No upper extremity supported;Feet supported Sitting balance-Leahy Scale: Good     Standing balance support: No upper extremity supported Standing balance-Leahy Scale: Fair                              Cognition Arousal/Alertness: Awake/alert Behavior During Therapy: WFL for tasks assessed/performed Overall Cognitive Status: Within Functional Limits for tasks assessed                                        Exercises      General Comments        Pertinent Vitals/Pain Pain Assessment: 0-10 Pain Score: 5  Pain Location: chest incision Pain Descriptors / Indicators: Guarding;Grimacing Pain Intervention(s): Monitored during session;Premedicated before session    Home Living                      Prior Function            PT Goals (current goals can now be found in the care plan section) Acute Rehab PT Goals Patient Stated Goal: return home and be independent PT  Goal Formulation: With patient/family Time For Goal Achievement: 01/07/19 Potential to Achieve Goals: Good Progress towards PT goals: Progressing toward goals    Frequency    Min 3X/week      PT Plan Current plan remains appropriate    Co-evaluation              AM-PAC PT "6 Clicks" Mobility   Outcome Measure  Help needed turning from your back to your side while in a flat bed without using bedrails?: A Little Help needed moving from lying on your back to sitting on the side of a flat bed without using bedrails?: A Little Help needed moving to and from a bed to a chair (including a wheelchair)?: A  Little Help needed standing up from a chair using your arms (e.g., wheelchair or bedside chair)?: A Little Help needed to walk in hospital room?: A Little Help needed climbing 3-5 steps with a railing? : A Lot 6 Click Score: 17    End of Session   Activity Tolerance: Patient tolerated treatment well Patient left: in chair;with call bell/phone within reach Nurse Communication: Mobility status PT Visit Diagnosis: Unsteadiness on feet (R26.81);Other abnormalities of gait and mobility (R26.89);Muscle weakness (generalized) (M62.81);History of falling (Z91.81)     Time: 2774-1287 PT Time Calculation (min) (ACUTE ONLY): 21 min  Charges:  $Gait Training: 8-22 mins                     Laurina Bustle, [place], DPT Acute Rehabilitation Services Pager [phone] Office [phone]    Vanetta Mulders 12/27/2018, 11:08 AM

## 2018-12-27 NOTE — [place] FL2 (Signed)
[place] MEDICAID FL2 LEVEL OF CARE SCREENING TOOL     IDENTIFICATION  Patient Name: Melissa Gonzales Birthdate: 16-Jun-1941 Sex: female Admission Date (Current Location): 12/14/2018  [facility] and [place] Number:  Producer, television/film/video and Address:  [facility] [place] [address] [place], [place] [address]      Provider Number: [id number]  Attending Physician Name and Address:  Kerin Perna, MD  Relative Name and Phone Number:  Anita Netherlands [phone]    Current Level of Care: Hospital Recommended Level of Care: Skilled Nursing Facility Prior Approval Number:    Date Approved/Denied:   PASRR Number: [id number]  Discharge Plan: SNF    Current Diagnoses: Patient Active Problem List   Diagnosis Date Noted  . 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 Coronary artery disease 12/17/2018  . Coronary artery calcification   . Chest pain 12/15/2018  . Palpitations 12/15/2018  . Right foot pain 12/04/2017  . Community acquired pneumonia 10/26/2015  . Parapneumonic effusion   . Intractable back pain 04/09/2015  . Intractable pain 04/08/2015  . S/P lumbar spinal fusion 11/27/2014  . Bilateral shoulder pain 09/02/2014  . Low back pain 09/02/2014  . Axillary pain 07/12/2014  . Wellness examination 05/22/2014  . Insomnia 05/14/2013  . Preventive measure 04/15/2012  . Depressive disorder 01/23/2012  . Urge incontinence 01/23/2012  . ARTHRITIS, HANDS, BILATERAL 03/25/2009  . HYPERCHOLESTEROLEMIA 02/27/2008  . MIGRAINE HEADACHE 02/27/2008  . Essential hypertension 02/27/2008  . Anxiety state 08/01/2007    Orientation RESPIRATION BLADDER Height & Weight     Self, Time, Situation, Place  Normal Incontinent Weight: 162 lb 7.7 oz (73.7 kg) Height:  5\' 4"  (162.6 cm)  BEHAVIORAL SYMPTOMS/MOOD NEUROLOGICAL BOWEL NUTRITION STATUS      Continent Diet(please see discharge summary )  AMBULATORY STATUS COMMUNICATION OF NEEDS Skin     Verbally Surgical  wounds(incision(closed)left;leg, incision(closed)right, leg, incision(closed) chest )                       Personal Care Assistance Level of Assistance  Bathing, Feeding, Dressing Bathing Assistance: Limited assistance Feeding assistance: Independent Dressing Assistance: Limited assistance     Functional Limitations Info  Sight, Hearing, Speech Sight Info: Adequate Hearing Info: Adequate Speech Info: Adequate    SPECIAL CARE FACTORS FREQUENCY  PT (By licensed PT), OT (By licensed OT)     PT Frequency: 3x per week OT Frequency: 3x per week             Contractures Contractures Info: Not present    Additional Factors Info  Code Status, Allergies Code Status Info: FULL Allergies Info: NKA           Current Medications (12/27/2018):  This is the current hospital active medication list Current Facility-Administered Medications  Medication Dose Route Frequency Provider Last Rate Last Dose  . 0.9 %  sodium chloride infusion   Intravenous PRN Sharlene Dory, PA-C   Stopped at 12/24/18 1409  . 0.9 %  sodium chloride infusion  250 mL Intravenous PRN Sharlene Dory, PA-C      . ALPRAZolam Prudy Feeler) tablet 0.25 mg  0.25 mg Oral TID PRN Jari Favre N, PA-C   0.25 mg at 12/27/18 2094  . aspirin EC tablet 325 mg  325 mg Oral Daily Sharlene Dory, [place]   325 mg at 12/27/18 7096   Or  . aspirin chewable tablet 324 mg  324 mg Per Tube Daily Jari Favre  N, PA-C      . atorvastatin (LIPITOR) tablet 80 mg  80 mg Oral q1800 Sharlene Dory, PA-C   80 mg at 12/26/18 1731  . bisacodyl (DULCOLAX) EC tablet 10 mg  10 mg Oral Daily Jari Favre N, [place]   10 mg at 12/22/18 7253   Or  . bisacodyl (DULCOLAX) suppository 10 mg  10 mg Rectal Daily Conte, Tessa N, PA-C      . brimonidine (ALPHAGAN) 0.15 % ophthalmic solution 1 drop  1 drop Both Eyes Q12H Sharlene Dory, PA-C   1 drop at 12/27/18 0817  . carvedilol (COREG) tablet 3.125 mg  3.125 mg Oral BID WC Asa Lente, Tessa N, PA-C   3.125 mg at  12/27/18 0813  . Chlorhexidine Gluconate Cloth 2 % PADS 6 each  6 each Topical Daily Sharlene Dory, PA-C   6 each at 12/25/18 1013  . docusate sodium (COLACE) capsule 200 mg  200 mg Oral Daily Jari Favre N, PA-C   200 mg at 12/22/18 6644  . dorzolamide-timolol (COSOPT) 22.3-6.8 MG/ML ophthalmic solution 1 drop  1 drop Both Eyes BID Sharlene Dory, PA-C   1 drop at 12/27/18 0347  . feeding supplement (ENSURE ENLIVE) (ENSURE ENLIVE) liquid 237 mL  237 mL Oral TID WC Conte, Tessa N, PA-C   237 mL at 12/27/18 0815  . furosemide (LASIX) tablet 40 mg  40 mg Oral Daily Kerin Perna, MD   40 mg at 12/27/18 4259  . gabapentin (NEURONTIN) capsule 200 mg  200 mg Oral BID Donata Clay, Theron Arista, MD   200 mg at 12/27/18 5638  . guaiFENesin (MUCINEX) 12 hr tablet 600 mg  600 mg Oral BID Sharlene Dory, PA-C   600 mg at 12/27/18 7564  . latanoprost (XALATAN) 0.005 % ophthalmic solution 1 drop  1 drop Both Eyes QHS Sharlene Dory, PA-C   1 drop at 12/26/18 2055  . levothyroxine (SYNTHROID, LEVOTHROID) tablet 25 mcg  25 mcg Oral Q0600 Sharlene Dory, [place]   25 mcg at 12/27/18 3329  . menthol-cetylpyridinium (CEPACOL) lozenge 3 mg  1 lozenge Oral PRN Conte, Tessa N, PA-C      . mometasone-formoterol (DULERA) 200-5 MCG/ACT inhaler 2 puff  2 puff Inhalation BID Sharlene Dory, PA-C   2 puff at 12/27/18 0841  . ondansetron (ZOFRAN) injection 4 mg  4 mg Intravenous Q6H PRN Conte, Tessa N, PA-C      . pantoprazole (PROTONIX) EC tablet 40 mg  40 mg Oral Daily Jari Favre N, PA-C   40 mg at 12/27/18 0813  . pentosan polysulfate (ELMIRON) capsule 100 mg  100 mg Oral TID Jari Favre N, PA-C   100 mg at 12/27/18 5188  . phenol (CHLORASEPTIC) mouth spray 1 spray  1 spray Mouth/Throat PRN Asa Lente, Tessa N, PA-C      . potassium chloride SA (K-DUR,KLOR-CON) CR tablet 20 mEq  20 mEq Oral Daily Donata Clay, Theron Arista, MD   20 mEq at 12/27/18 0813  . sodium chloride flush (NS) 0.9 % injection 3 mL  3 mL Intravenous Q12H Conte, Tessa N,  PA-C   3 mL at 12/27/18 0813  . sodium chloride flush (NS) 0.9 % injection 3 mL  3 mL Intravenous PRN Asa Lente, Tessa N, PA-C      . spiritus frumenti (ethyl alcohol) solution 1 each  1 each Oral Q supper Kerin Perna, MD   1 each at 12/26/18 1732  . thiamine (VITAMIN B-1) tablet  100 mg  100 mg Oral Daily Sharlene DoryConte, Tessa N, PA-C   100 mg at 12/27/18 [id number]  . traMADol (ULTRAM) tablet 50-100 mg  50-100 mg Oral Q4H PRN Sharlene DoryConte, Tessa N, PA-C   100 mg at 12/27/18 [id number]  . URIBEL 118 MG CAPS 118 mg  1 capsule Oral BID PRN Sharlene Doryonte, Tessa N, PA-C         Discharge Medications: Please see discharge summary for a list of discharge medications.  Relevant Imaging Results:  Relevant Lab Results:   Additional Information SSN [id number]  Eduard Rouxynthia N Edwardo Wojnarowski, LCSWA

## 2018-12-28 ENCOUNTER — Other Ambulatory Visit (HOSPITAL_COMMUNITY): Payer: Self-pay | Admitting: Physician Assistant

## 2018-12-28 MED ORDER — CARVEDILOL 3.125 MG PO TABS: 3.1250 mg | ORAL_TABLET | Freq: Two times a day (BID) | ORAL | 1 refills | Status: DC

## 2018-12-28 MED ORDER — ASPIRIN 325 MG PO TBEC: 325.0000 mg | DELAYED_RELEASE_TABLET | Freq: Every day | ORAL | 0 refills | Status: DC

## 2018-12-28 MED ORDER — LEVOTHYROXINE SODIUM 25 MCG PO TABS: 25.0000 ug | ORAL_TABLET | Freq: Every day | ORAL | 1 refills | Status: AC

## 2018-12-28 MED ORDER — FUROSEMIDE 40 MG PO TABS: 40.0000 mg | ORAL_TABLET | Freq: Every day | ORAL | 0 refills | Status: DC

## 2018-12-28 MED ORDER — POTASSIUM CHLORIDE CRYS ER 20 MEQ PO TBCR: 20.0000 meq | EXTENDED_RELEASE_TABLET | Freq: Every day | ORAL | 0 refills | Status: DC

## 2018-12-28 MED ORDER — TRAMADOL HCL 50 MG PO TABS: 50.0000 mg | ORAL_TABLET | Freq: Four times a day (QID) | ORAL | 0 refills | Status: DC | PRN

## 2018-12-28 MED ORDER — GABAPENTIN 100 MG PO CAPS: 200.0000 mg | ORAL_CAPSULE | Freq: Two times a day (BID) | ORAL | 1 refills | Status: DC

## 2018-12-28 MED ORDER — GUAIFENESIN ER 600 MG PO TB12: 600.0000 mg | ORAL_TABLET | Freq: Two times a day (BID) | ORAL | 0 refills | Status: AC

## 2018-12-28 NOTE — Progress Notes (Signed)
PICC line removed per order. Site clean, dry, and intact. Vaseline gauze and gauze dressing applied. Patient and nurse aware patient on bedrest until 1325. Patient aware to leave dressing on and to keep it dry for 24 hours.

## 2018-12-28 NOTE — Progress Notes (Signed)
Report called to [facility] nurse Okey Dupre, RN who will be receiving nurse for patient. All questions addressed.

## 2018-12-28 NOTE — Clinical Social Work Note (Signed)
Pt will need UHC auth prior to d/c. UHC does not authorize on the weekend. CSW will submit for auth Monday morning.  [place], [place] [phone]

## 2018-12-28 NOTE — Clinical Social Work Note (Addendum)
Clinical Social Worker facilitated patient discharge including contacting patient family and facility to confirm patient discharge plans.  Clinical information faxed to facility and family agreeable with plan.  Pt's daughter will transport pt to [facility].  RN to call [phone] for report prior to discharge.  Clinical Social Worker will sign off for now as social work intervention is no longer needed. Please consult [place] again if new need arises.  [place], [place] [phone]

## 2018-12-28 NOTE — Clinical Social Work Placement (Signed)
   CLINICAL SOCIAL WORK PLACEMENT  NOTE  Date:  12/28/2018  Patient Details  Name: Melissa Gonzales MRN: [id number] Date of Birth: [birth year]  Clinical Social Work is seeking post-discharge placement for this patient at the Skilled  Nursing Facility level of care (*CSW will initial, date and re-position this form in  chart as items are completed):      Patient/family provided with [facility] Clinical Social Work Department's list of facilities offering this level of care within the geographic area requested by the patient (or if unable, by the patient's family).  Yes   Patient/family informed of their freedom to choose among providers that offer the needed level of care, that participate in Medicare, Medicaid or managed care program needed by the patient, have an available bed and are willing to accept the patient.      Patient/family informed of [place]'s ownership interest in [facility] and [facility], as well as of the fact that they are under no obligation to receive care at these facilities.  PASRR submitted to EDS on       PASRR number received on 12/27/18     Existing PASRR number confirmed on       FL2 transmitted to all facilities in geographic area requested by pt/family on 12/27/18     FL2 transmitted to all facilities within larger geographic area on       Patient informed that his/her managed care company has contracts with or will negotiate with certain facilities, including the following:        Yes   Patient/family informed of bed offers received.  Patient chooses bed at [facility]     Physician recommends and patient chooses bed at      Patient to be transferred to [facility] at [place] on 12/28/18.  Patient to be transferred to facility by Daughter     Patient family notified on 12/28/18 of transfer.  Name of family member notified:  Daughter     PHYSICIAN       Additional Comment:     _______________________________________________ Maree Krabbe, LCSW 12/28/2018, 3:17 PM

## 2018-12-28 NOTE — Progress Notes (Signed)
Patient escorted to [employer] by Tressia Miners, RN by wheelchair.

## 2018-12-28 NOTE — Clinical Social Work Note (Signed)
CSW received a call from [place]. Pt will private pay and will be accepted into [facility] today, understanding insurance will not cover it.   [place], [place] [phone]

## 2018-12-28 NOTE — Progress Notes (Signed)
Patient discharged.  Vital signs obtained and were stable,  CCMD called.  AVS given to patient and daughter and education provided to patient and daughter all questions addressed.  Telemetry box returned to proper location by [employer].

## 2018-12-28 NOTE — Progress Notes (Signed)
      [address]       [place] [address]             [phone]      9 Days Post-Op Procedure(s) (LRB): CORONARY ARTERY BYPASS GRAFTING (CABG)x3, using left internal mammary arteryand right and left greater saphenous veins harvested endoscopically (N/A) TRANSESOPHAGEAL ECHOCARDIOGRAM (TEE) (N/A) Subjective: No issues overnight. The patient is looking forward to the next phase of her recovery  Objective: Vital signs in last 24 hours: Temp:  [98 F (36.7 C)-98.5 F (36.9 C)] 98.4 F (36.9 C) (02/22 0818) Pulse Rate:  [86-100] 100 (02/22 0818) Cardiac Rhythm: Normal sinus rhythm (02/22 0818) Resp:  [18-24] 21 (02/22 0818) BP: (103-131)/(55-67) 122/55 (02/22 0818) SpO2:  [92 %-97 %] 97 % (02/22 0818) FiO2 (%):  [21 %] 21 % (02/22 0806) Weight:  [72 kg] 72 kg (02/22 0539)     Intake/Output from previous day: 02/21 0701 - 02/22 0700 In: 600 [P.O.:600] Out: 2202 [Urine:2201; Stool:1] Intake/Output this shift: No intake/output data recorded.  General appearance: alert, cooperative and no distress Heart: regular rate and rhythm, S1, S2 normal, no murmur, click, rub or gallop Lungs: clear to auscultation bilaterally Abdomen: soft, non-tender; bowel sounds normal; no masses,  no organomegaly Extremities: extremities normal, atraumatic, no cyanosis or edema Wound: clean and dry  Lab Results: Recent Labs    12/26/18 0320  WBC 6.9  HGB 8.2*  HCT 25.4*  PLT 285   BMET:  Recent Labs    12/26/18 0320  NA 137  K 3.6  CL 101  CO2 27  GLUCOSE 114*  BUN 7*  CREATININE 0.54  CALCIUM 8.4*    PT/INR: No results for input(s): LABPROT, INR in the last 72 hours. ABG    Component Value Date/Time   PHART 7.389 12/20/2018 0040   HCO3 22.6 12/20/2018 0040   TCO2 24 12/20/2018 0040   ACIDBASEDEF 2.0 12/20/2018 0040   O2SAT 58.4 12/24/2018 0420   CBG (last 3)  No results for input(s): GLUCAP in the last 72 hours.  Assessment/Plan: S/P Procedure(s)  (LRB): CORONARY ARTERY BYPASS GRAFTING (CABG)x3, using left internal mammary arteryand right and left greater saphenous veins harvested endoscopically (N/A) TRANSESOPHAGEAL ECHOCARDIOGRAM (TEE) (N/A)  1. CV-NSR in the 70s, BP well controlled, continued current medications 2. Pulm-tolerating room air with good oxygen saturation 3. Renal-creatinine 0.54, electrolytes well controlled. Weight continues to trend down. 4. H and H 8.2/25.4, platelets 285k-no new labs 5. Blood glucose has been well controlled.   Plan: to SNF today as long as bed still available.    LOS: 11 days    Sharlene Dory 12/28/2018

## 2018-12-28 NOTE — Progress Notes (Signed)
Chest tube sutures removed with Benzoin and steri-strips per order by Suann Larry, PA.  Patient tolerated procedure well.

## 2018-12-30 ENCOUNTER — Telehealth (HOSPITAL_COMMUNITY): Payer: Self-pay

## 2018-12-30 NOTE — Telephone Encounter (Signed)
Attempted to call patient in regards to Cardiac Rehab - LM on VM °Gloria W. Support Rep II °

## 2018-12-30 NOTE — Telephone Encounter (Signed)
Pt insurance is active and benefits verified through [employer] $20.00, DED $0.00/0 met, out of pocket $3,600/$104.00 met, co-insurance 0%. no pre-authorization required. [employer]port, 12/30/2018 @ 3:34pm, REF# [id number]  Tedra [place]. Support Rep II

## 2019-01-02 ENCOUNTER — Other Ambulatory Visit: Payer: Self-pay

## 2019-01-02 ENCOUNTER — Telehealth: Payer: Self-pay

## 2019-01-02 DIAGNOSIS — Z951 Presence of aortocoronary bypass graft: Secondary | ICD-10-CM

## 2019-01-02 NOTE — Telephone Encounter (Signed)
Ardine Eng with [facility] rehabilitation facility contacted the office at the request of the medical director there concerned about possible infection at patient's right [facility] site.  Ms. Junior is s/p CABG 12/19/2018 with Dr. Donata Clay.  She stated that there is redness, drainage, and warmth at the site.  Unable to get transportation from the facility today to see PA for wound check.  Appointment made for tomorrow at 1pm with chest xray to have site evaluated.

## 2019-01-03 ENCOUNTER — Encounter: Payer: Self-pay | Admitting: Cardiothoracic Surgery

## 2019-01-03 ENCOUNTER — Ambulatory Visit (INDEPENDENT_AMBULATORY_CARE_PROVIDER_SITE_OTHER): Payer: Self-pay | Admitting: Cardiothoracic Surgery

## 2019-01-03 ENCOUNTER — Ambulatory Visit
Admission: RE | Admit: 2019-01-03 | Discharge: 2019-01-03 | Disposition: A | Discharge status: Home / Self Care | Payer: Medicare Other | Source: Ambulatory Visit | Attending: Cardiothoracic Surgery | Admitting: Cardiothoracic Surgery

## 2019-01-03 ENCOUNTER — Telehealth: Payer: Self-pay

## 2019-01-03 ENCOUNTER — Other Ambulatory Visit: Payer: Self-pay

## 2019-01-03 VITALS — BP 121/76 | HR 89 | Resp 18 | Ht 64.0 in | Wt 159.8 lb

## 2019-01-03 DIAGNOSIS — Z951 Presence of aortocoronary bypass graft: Secondary | ICD-10-CM

## 2019-01-03 DIAGNOSIS — Z5189 Encounter for other specified aftercare: Secondary | ICD-10-CM

## 2019-01-03 NOTE — Telephone Encounter (Signed)
Confirmation number for specimen pick-up with [facility]- [id number].  Wound culture for left lower extremity.

## 2019-01-03 NOTE — Telephone Encounter (Signed)
Victorino Dike, RN with Larita Fife at [facility] where patient is staying s/p CABG with Dr. Donata Clay made aware that patient is to have dressing changes daily to her left upper EVH site, NS wet-to-dry.  Also made her aware of patient next follow-up appointment 01/08/2019 with Dr. Donata Clay for a wound check and made aware of new medication added, Metolazone 5 mg x3 days for fluid overload.  She acknowledged receipt.

## 2019-01-03 NOTE — Progress Notes (Signed)
PCP is Rodrigo Ran, MD Referring Provider is Chilton Si, MD  Chief Complaint  Patient presents with  . Routine Post Op    s/p CABG, EVH site possible infection  . Wound Check    HPI: Patient returns for left thigh wound drainage at saphenous vein harvest site from CABG times 3 February 13.  [name] are edematous in the left thigh incision below the knee open with exposed subcutaneous tissue and a 3 cm tunnel extending superiorly with fluid collection.  Wound culture was taken.  [name] and started the patient on doxycycline 100 mg p.o. twice daily there is no purulence in the wound.  I packed it with a 2 x 2 gauze wet-to-dry.  The skilled nursing facility will continue dressing changes daily, continue doxycycline 100 mg twice daily for total of 10 days.  The patient has persistent lower leg edema.  She has been taking Lasix 40 mg a day.  I have added oral metolazone 5 mg daily to help with diuresis.  The surgical incision in the right leg and the sternal incision are all healing well.  Patient's breath sounds are clear but she will get a chest x-ray today.  Cardiac rhythm is sinus and blood pressure is normal.   Past Medical History:  Diagnosis Date  . Anxiety   . Arthritis   . Depression   . Diverticulosis   . GERD (gastroesophageal reflux disease)   . Glaucoma    Bilateral eyes  . Hyperlipidemia   . Hypertension   . Peripheral neuropathy   . Pneumonia    hx of  . Urgency of urination     Past Surgical History:  Procedure Laterality Date  . BACK SURGERY    . CHOLECYSTECTOMY  1993  . COLONOSCOPY    . CORONARY ARTERY BYPASS GRAFT N/A 12/19/2018   Procedure: CORONARY ARTERY BYPASS GRAFTING (CABG)x3, using left internal mammary arteryand right and left greater saphenous veins harvested endoscopically;  Surgeon: Kerin Perna, MD;  Location: [facility] OR;  Service: Open Heart Surgery;  Laterality: N/A;  . EYE SURGERY     Cataract Surgery Bilateral, 5 years ago  . LEFT HEART CATH AND CORONARY ANGIOGRAPHY N/A 12/17/2018   Procedure: LEFT HEART CATH AND CORONARY ANGIOGRAPHY;  Surgeon: Lennette Bihari, MD;  Location: [facility] INVASIVE CV LAB;  Service: Cardiovascular;  Laterality: N/A;  . MAXIMUM ACCESS (MAS)POSTERIOR LUMBAR INTERBODY FUSION (PLIF) 1 LEVEL N/A 03/17/2015   Procedure: Lumbar five -sacral one Maximum access posterior lumbar interbody fusion/Lumbar two-three  Laminectomy and extension of instrumentation to Sacral-one;  Surgeon: Tia Alert, MD;  Location: [facility] NEURO ORS;  Service: Neurosurgery;  Laterality: N/A;  . MAXIMUM ACCESS (MAS)POSTERIOR LUMBAR INTERBODY FUSION (PLIF) 2 LEVEL N/A 11/27/2014   Procedure: LUMBAR THREE-FOUR, LUMBAR FOUR-FIVE MAXIMUM ACCESS POSTERIOR LUMBAR INTERBODY FUSION;  Surgeon: Tia Alert, MD;  Location: [facility] NEURO ORS;  Service: Neurosurgery;  Laterality: N/A;  L3-4 L4-5 MAXIMUM ACCESS POSTERIOR LUMBAR INTERBODY FUSION  . TEE WITHOUT CARDIOVERSION N/A 12/19/2018   Procedure: TRANSESOPHAGEAL ECHOCARDIOGRAM (TEE);  Surgeon: Donata Clay, Theron Arista, MD;  Location: [facility] OR;  Service: Open Heart Surgery;  Laterality: N/A;  . VAGINAL HYSTERECTOMY  1970s   uterine prolapse    Family History  Problem Relation Age of Onset  . Mental illness Daughter   . Drug abuse Grandchild   . Heart disease Mother 81       died CHF age 62  . Hyperlipidemia Sister   .  Hypertension Sister   . Cancer Father        Primary Cell Liver Cancer  . Diabetes Neg Hx     Social History Social History   Tobacco Use  . Smoking status: Never Smoker  . Smokeless tobacco: Never Used  Substance Use Topics  . Alcohol use: Yes    Alcohol/week: 1.0 standard drinks    Types: 1 Glasses of wine per week    Comment: 1-2 glass per night, sometimes none  . Drug use: No    Current Outpatient Medications  Medication Sig Dispense Refill  . ALPHAGAN P 0.1 % SOLN Place 1 drop into both eyes every 12 (twelve) hours.     .  ALPRAZolam (XANAX) 0.25 MG tablet TAKE 1 TABLET BY MOUTH TWICE DAILY AS NEEDED FOR ANXIETY (Patient taking differently: Take 0.25 mg by mouth 2 (two) times daily as needed for anxiety. ) 30 tablet 0  . aspirin EC 325 MG EC tablet Take 1 tablet (325 mg total) by mouth daily. 30 tablet 0  . atorvastatin (LIPITOR) 80 MG tablet Take 80 mg by mouth daily.    . Calcium Carb-Cholecalciferol (CALCIUM 600 + D PO) Take 1 tablet by mouth daily.     . carvedilol (COREG) 3.125 MG tablet Take 1 tablet (3.125 mg total) by mouth 2 (two) times daily with a meal. 60 tablet 1  . dorzolamide-timolol (COSOPT) 22.3-6.8 MG/ML ophthalmic solution Place 1 drop into both eyes 2 (two) times daily.     Marland Kitchen doxycycline (VIBRAMYCIN) 50 MG capsule Take 50 mg by mouth 2 (two) times daily.    Marland Kitchen ELMIRON 100 MG capsule Take 100 mg by mouth 3 (three) times daily.    Marland Kitchen esomeprazole (NEXIUM) 40 MG capsule Take 1 capsule (40 mg total) by mouth daily before breakfast. 90 capsule 3  . furosemide (LASIX) 40 MG tablet Take 1 tablet (40 mg total) by mouth daily for 5 days. 5 tablet 0  . gabapentin (NEURONTIN) 100 MG capsule Take 2 capsules (200 mg total) by mouth 2 (two) times daily. 60 capsule 1  . levothyroxine (SYNTHROID, LEVOTHROID) 25 MCG tablet Take 1 tablet (25 mcg total) by mouth daily at 6 (six) AM. 30 tablet 1  . Meth-Hyo-M Bl-Na Phos-Ph Sal (URIBEL) 118 MG CAPS Take 1 capsule by mouth 2 (two) times daily as needed (for pain).     . Multiple Vitamins-Minerals (MULTIVITAMIN WITH MINERALS) tablet Take 1 tablet by mouth daily.    . Netarsudil-Latanoprost 0.02-0.005 % SOLN Place 1 drop into both eyes at bedtime.    . potassium chloride SA (K-DUR,KLOR-CON) 20 MEQ tablet Take 1 tablet (20 mEq total) by mouth daily for 5 days. 5 tablet 0  . traMADol (ULTRAM) 50 MG tablet Take 1 tablet (50 mg total) by mouth every 6 (six) hours as needed for moderate pain. 30 tablet 0  . vitamin B-12 (CYANOCOBALAMIN) 100 MCG tablet Take 100 mcg by mouth  daily.     No current facility-administered medications for this visit.     No Known Allergies  Review of Systems  Patient's appetite is improved and food is better at the skilled nursing facility in the hospital No fever Patient still has some postoperative chest discomfort-surgical pain.  BP 121/76 (BP Location: Right Arm, Patient Position: Sitting, Cuff Size: Normal)   Pulse 89   Resp 18   Ht  (1.626 m)   Wt 159 lb 12.8 oz (72.5 kg)   SpO2 100% Comment: RA  BMI 27.43  kg/m  Physical Exam Alert and comfortable Sternal incision well-healed Lungs clear Heart rhythm regular no murmur Abdomen soft 2+ bilateral ankle edema Saphenous vein incision below the left knee opened cultured and packed with wet-to-dry dressing  Diagnostic Tests: Chest x-ray pending  Impression: Patient will need to remain at [facility] burn for about another 10 days.  She will return for wound check in 5 days and then probably 5 days after that.  Plan: Continue oral antibiotic-doxycycline.  Daily dressing changes by skilled nursing facility.  Add metolazone 3 doses of 5 mg every morning for the next 3 days.  Return for office visit and wound check on March 4.   Mikey Bussing, MD [facility] [phone]

## 2019-01-06 ENCOUNTER — Other Ambulatory Visit: Payer: Self-pay

## 2019-01-06 LAB — WOUND CULTURE
MICRO NUMBER:: 256525
SPECIMEN QUALITY:: ADEQUATE

## 2019-01-06 MED ORDER — CIPROFLOXACIN HCL 250 MG PO TABS: 250.0000 mg | ORAL_TABLET | Freq: Two times a day (BID) | ORAL | 0 refills | Status: AC

## 2019-01-08 ENCOUNTER — Encounter: Payer: Self-pay | Admitting: Cardiothoracic Surgery

## 2019-01-08 ENCOUNTER — Ambulatory Visit (INDEPENDENT_AMBULATORY_CARE_PROVIDER_SITE_OTHER): Payer: Self-pay | Admitting: Physician Assistant

## 2019-01-08 ENCOUNTER — Other Ambulatory Visit: Payer: Self-pay

## 2019-01-08 VITALS — BP 126/68 | HR 80 | Resp 18 | Ht 64.0 in | Wt 146.0 lb

## 2019-01-08 DIAGNOSIS — Z951 Presence of aortocoronary bypass graft: Secondary | ICD-10-CM

## 2019-01-08 DIAGNOSIS — Z4889 Encounter for other specified surgical aftercare: Secondary | ICD-10-CM

## 2019-01-08 NOTE — Progress Notes (Signed)
HPI: Melissa Gonzales is a very pleasant 78 year old female with a history of hypertension, dyslipidemia, and bladder cancer who is status post CABG x3 on 12/19/2022 severe three-vessel coronary artery disease and well-preserved LV function.  Her postoperative course was uneventful and she was discharged to a skilled nursing facility on 12/28/2018.  Since that time, she has been followed in our office by Dr. Maren Beach for management of volume overload as well as for separation of theEVH incision at the left medial knee.  At the time of her last visit about 1 week ago, she was started on Demadex for 3 days addition to her daily Lasix.  This resulted in a significant diuresis. She has been receiving wound care that includes saline moist to dry dressings to the left leg incision daily by the staff at [facility] skilled nursing facility.  She was initially treated with doxycycline but this is since been changed to Cipro and she has about 4 more days of antibiotic to complete this course. Overall, she feels that she is making substantial progress.  She has no new complaints.  She is anxious to return home where she was living by herself in an apartment complex.   Current Outpatient Medications  Medication Sig Dispense Refill  . ALPHAGAN P 0.1 % SOLN Place 1 drop into both eyes every 12 (twelve) hours.     . ALPRAZolam (XANAX) 0.25 MG tablet TAKE 1 TABLET BY MOUTH TWICE DAILY AS NEEDED FOR ANXIETY (Patient taking differently: Take 0.25 mg by mouth 2 (two) times daily as needed for anxiety. ) 30 tablet 0  . aspirin EC 325 MG EC tablet Take 1 tablet (325 mg total) by mouth daily. 30 tablet 0  . atorvastatin (LIPITOR) 80 MG tablet Take 80 mg by mouth daily.    . Calcium Carb-Cholecalciferol (CALCIUM 600 + D PO) Take 1 tablet by mouth daily.     . carvedilol (COREG) 3.125 MG tablet Take 1 tablet (3.125 mg total) by mouth 2 (two) times daily with a meal. 60 tablet 1  . ciprofloxacin (CIPRO) 250 MG tablet Take 1 tablet  (250 mg total) by mouth 2 (two) times daily for 7 days. 14 tablet 0  . dorzolamide-timolol (COSOPT) 22.3-6.8 MG/ML ophthalmic solution Place 1 drop into both eyes 2 (two) times daily.     Marland Kitchen ELMIRON 100 MG capsule Take 100 mg by mouth 3 (three) times daily.    Marland Kitchen esomeprazole (NEXIUM) 40 MG capsule Take 1 capsule (40 mg total) by mouth daily before breakfast. 90 capsule 3  . gabapentin (NEURONTIN) 100 MG capsule Take 2 capsules (200 mg total) by mouth 2 (two) times daily. 60 capsule 1  . levothyroxine (SYNTHROID, LEVOTHROID) 25 MCG tablet Take 1 tablet (25 mcg total) by mouth daily at 6 (six) AM. 30 tablet 1  . Meth-Hyo-M Bl-Na Phos-Ph Sal (URIBEL) 118 MG CAPS Take 1 capsule by mouth 2 (two) times daily as needed (for pain).     . Multiple Vitamins-Minerals (MULTIVITAMIN WITH MINERALS) tablet Take 1 tablet by mouth daily.    . Netarsudil-Latanoprost 0.02-0.005 % SOLN Place 1 drop into both eyes at bedtime.    . traMADol (ULTRAM) 50 MG tablet Take 1 tablet (50 mg total) by mouth every 6 (six) hours as needed for moderate pain. 30 tablet 0  . vitamin B-12 (CYANOCOBALAMIN) 100 MCG tablet Take 100 mcg by mouth daily.    . furosemide (LASIX) 40 MG tablet Take 1 tablet (40 mg total) by mouth daily for 5 days.  5 tablet 0  . potassium chloride SA (K-DUR,KLOR-CON) 20 MEQ tablet Take 1 tablet (20 mEq total) by mouth daily for 5 days. 5 tablet 0   No current facility-administered medications for this visit.     Physical Exam: Vital signs:  Blood pressure 126/68  Pulse 80  Respirations 18  Oxygen saturation 94%  Extremities : Trace lower extremity edema. Wound: Wound is clean.  It measures 2.5 to 3 cm in length and is about 2.5 cm deep.  It is clean and granulating nicely.  There is no erythema around the wound.  Diagnostic Tests:  EXAM: CHEST - 2 VIEW  COMPARISON:  12/26/2018.  FINDINGS: Prior CABG. Cardiomegaly with normal pulmonary vascularity. Improved aeration noted both lung bases. Mild  residual bibasilar atelectasis and or scarring. No pleural effusion or pneumothorax. Degenerative change thoracic spine. Prior lumbar spine fusion. Surgical clips right upper quadrant.  IMPRESSION: 1. Prior CABG. Stable cardiomegaly. No pulmonary venous congestion.  2. Improved aeration noted both lungs. Mild residual bibasilar atelectasis and or scarring.   Electronically Signed   By: Maisie Fus  Register   On: 01/03/2019 13:19  Impression / Plan: Melissa Gonzales is continued to make satisfactory progress following CABG x3 3 weeks ago.  The left leg wound is clean and appears to be healing secondarily without complication.  After to complete the antibiotic course as previously prescribed.  I see no reason to continue antibiotics beyond that.  She is to continue with the saline wet-to-dry dressings daily.  She was anxious to return home and if the skilled facility is able to arrange home health nursing to assist her with dressing changes, I believe this would be satisfactory.  We made an appointment for follow-up with Dr. Maren Beach in 1 to 2 weeks.   Leary Roca, PA-C [facility] [phone]

## 2019-01-08 NOTE — Patient Instructions (Signed)
Continue antibiotics until prescription is complete. Continue saline moist-to-dry dressings daily

## 2019-01-15 ENCOUNTER — Ambulatory Visit: Payer: Medicare Other | Admitting: Cardiology

## 2019-01-15 ENCOUNTER — Ambulatory Visit (INDEPENDENT_AMBULATORY_CARE_PROVIDER_SITE_OTHER): Payer: Self-pay

## 2019-01-15 ENCOUNTER — Other Ambulatory Visit: Payer: Self-pay

## 2019-01-15 DIAGNOSIS — Z4801 Encounter for change or removal of surgical wound dressing: Secondary | ICD-10-CM

## 2019-01-15 NOTE — Progress Notes (Signed)
Patient here today for wound check/ dressing change.   I believe patient/ nursing facility patient stays at receieved wrong date for follow-up visit.  Patient originally scheduled for next Wednesday, 01/21/2019. Patient wet-to-dry dressing change daily.  Left leg EVH site daily packing, s/p CABG x3 with Dr. Donata Clay on 12/19/2018.  Wound healing, minimal serosanguinous drainage from site.  Good granulating tissue at wound bed.  Does tunnel 1.5'', opening 1 l x 1 w.  Purple color noted around opening of wound.  Patient aware of her follow-up appointment next week.  Will continue to monitor.   Advised patient if she noticed any changes to the drainage/ incision site color with temperature (for example increase in drainage, swelling, redness, red streaks, puss, foul odor or smell) to call the office for further evaluation.  Patient acknowledged receipt.

## 2019-01-16 ENCOUNTER — Telehealth: Payer: Self-pay

## 2019-01-16 ENCOUNTER — Other Ambulatory Visit: Payer: Self-pay

## 2019-01-16 MED ORDER — GABAPENTIN 100 MG PO CAPS: 200.0000 mg | ORAL_CAPSULE | Freq: Two times a day (BID) | ORAL | 1 refills | Status: DC

## 2019-01-16 NOTE — Telephone Encounter (Signed)
Patient contacted the office stating that she has run out of her Gabapentin.  She was prescribed it in the hospital for pain s/p CABG with Dr. Donata Clay.  Refilled prescription for 30 days and no refills to patient's preferred [facility], [facility] [address].  Made Dr. Donata Clay aware.  I also advised the patient to talk with Dr. Donata Clay about this medication at her next follow-up appointment.

## 2019-01-20 ENCOUNTER — Other Ambulatory Visit: Payer: Self-pay | Admitting: *Deleted

## 2019-01-20 ENCOUNTER — Telehealth: Payer: Self-pay

## 2019-01-20 ENCOUNTER — Other Ambulatory Visit: Payer: Self-pay

## 2019-01-20 DIAGNOSIS — Z951 Presence of aortocoronary bypass graft: Secondary | ICD-10-CM

## 2019-01-20 NOTE — Telephone Encounter (Signed)
-----   Message from Kerin Perna, MD sent at 01/19/2019  7:58 PM EDT ----- You did the right thing thanks ----- Message ----- From: Steve Rattler, RN Sent: 01/16/2019  12:05 PM EDT To: Kerin Perna, MD  Hey,  I don't usually refill medications until approval from you, however, she was tearful and requested a refill of her Gabapentin for pain.  I did sent in one 30 day supply with no refills to her pharmacy to get her through until you see her next week on 01/23/2019.  Just FYI.  Thanks,  Morrie Sheldon

## 2019-01-21 ENCOUNTER — Ambulatory Visit: Payer: Medicare Other | Admitting: Cardiothoracic Surgery

## 2019-01-21 ENCOUNTER — Encounter: Payer: Self-pay | Admitting: Cardiothoracic Surgery

## 2019-01-21 ENCOUNTER — Ambulatory Visit: Payer: Medicare Other | Admitting: Cardiology

## 2019-01-21 ENCOUNTER — Ambulatory Visit (INDEPENDENT_AMBULATORY_CARE_PROVIDER_SITE_OTHER): Payer: Self-pay | Admitting: Cardiothoracic Surgery

## 2019-01-21 ENCOUNTER — Ambulatory Visit
Admission: RE | Admit: 2019-01-21 | Discharge: 2019-01-21 | Disposition: A | Discharge status: Home / Self Care | Payer: Medicare Other | Source: Ambulatory Visit | Attending: Cardiothoracic Surgery | Admitting: Cardiothoracic Surgery

## 2019-01-21 ENCOUNTER — Encounter: Payer: Self-pay | Admitting: Cardiology

## 2019-01-21 VITALS — BP 134/79 | HR 98 | Resp 16 | Ht 64.0 in | Wt 154.0 lb

## 2019-01-21 VITALS — BP 110/64 | HR 97 | Temp 97.8°F | Ht 64.0 in | Wt 154.0 lb

## 2019-01-21 DIAGNOSIS — Z4889 Encounter for other specified surgical aftercare: Secondary | ICD-10-CM

## 2019-01-21 DIAGNOSIS — Z951 Presence of aortocoronary bypass graft: Secondary | ICD-10-CM

## 2019-01-21 DIAGNOSIS — G629 Polyneuropathy, unspecified: Secondary | ICD-10-CM

## 2019-01-21 DIAGNOSIS — I10 Essential (primary) hypertension: Secondary | ICD-10-CM | POA: Diagnosis not present

## 2019-01-21 DIAGNOSIS — F418 Other specified anxiety disorders: Secondary | ICD-10-CM

## 2019-01-21 DIAGNOSIS — I2510 Atherosclerotic heart disease of native coronary artery without angina pectoris: Secondary | ICD-10-CM

## 2019-01-21 DIAGNOSIS — E785 Hyperlipidemia, unspecified: Secondary | ICD-10-CM

## 2019-01-21 DIAGNOSIS — R4589 Other symptoms and signs involving emotional state: Secondary | ICD-10-CM

## 2019-01-21 MED ORDER — HYDROCHLOROTHIAZIDE 12.5 MG PO CAPS: 12.5000 mg | ORAL_CAPSULE | Freq: Every day | ORAL | 3 refills | Status: DC

## 2019-01-21 MED ORDER — CIPROFLOXACIN HCL 250 MG PO TABS: 250.0000 mg | ORAL_TABLET | Freq: Two times a day (BID) | ORAL | 0 refills | Status: DC

## 2019-01-21 MED ORDER — GABAPENTIN 100 MG PO CAPS: 200.0000 mg | ORAL_CAPSULE | Freq: Two times a day (BID) | ORAL | 1 refills | Status: DC

## 2019-01-21 MED ORDER — FUROSEMIDE 40 MG PO TABS: 40.0000 mg | ORAL_TABLET | Freq: Every day | ORAL | 0 refills | Status: DC

## 2019-01-21 NOTE — Progress Notes (Addendum)
PCP is Rodrigo Ran, MD Referring Provider is Chilton Si, MD  Chief Complaint  Patient presents with  . Routine Post Op    3 week f/u wound check and eval another site behind knee...Marland KitchenCXR DONE TODAY...also discuss further use/refilling Gabapentin    NVB:TYOM negative L leg wound infction  Will start cipro cxr clear strnal wound clean Walking 1.5 miles /day   Past Medical History:  Diagnosis Date  . Anxiety   . Arthritis   . Depression   . Diverticulosis   . GERD (gastroesophageal reflux disease)   . Glaucoma    Bilateral eyes  . Hyperlipidemia   . Hypertension   . Peripheral neuropathy   . Pneumonia    hx of  . Urgency of urination     Past Surgical History:  Procedure Laterality Date  . BACK SURGERY    . CHOLECYSTECTOMY  1993  . COLONOSCOPY    . CORONARY ARTERY BYPASS GRAFT N/A 12/19/2018   Procedure: CORONARY ARTERY BYPASS GRAFTING (CABG)x3, using left internal mammary arteryand right and left greater saphenous veins harvested endoscopically;  Surgeon: Kerin Perna, MD;  Location: [facility] OR;  Service: Open Heart Surgery;  Laterality: N/A;  . EYE SURGERY     Cataract Surgery Bilateral, 5 years ago  . LEFT HEART CATH AND CORONARY ANGIOGRAPHY N/A 12/17/2018   Procedure: LEFT HEART CATH AND CORONARY ANGIOGRAPHY;  Surgeon: Lennette Bihari, MD;  Location: [facility] INVASIVE CV LAB;  Service: Cardiovascular;  Laterality: N/A;  . MAXIMUM ACCESS (MAS)POSTERIOR LUMBAR INTERBODY FUSION (PLIF) 1 LEVEL N/A 03/17/2015   Procedure: Lumbar five -sacral one Maximum access posterior lumbar interbody fusion/Lumbar two-three  Laminectomy and extension of instrumentation to Sacral-one;  Surgeon: Tia Alert, MD;  Location: [facility] NEURO ORS;  Service: Neurosurgery;  Laterality: N/A;  . MAXIMUM ACCESS (MAS)POSTERIOR LUMBAR INTERBODY FUSION (PLIF) 2 LEVEL N/A 11/27/2014   Procedure: LUMBAR THREE-FOUR, LUMBAR FOUR-FIVE MAXIMUM ACCESS POSTERIOR LUMBAR INTERBODY FUSION;  Surgeon: Tia Alert, MD;   Location: [facility] NEURO ORS;  Service: Neurosurgery;  Laterality: N/A;  L3-4 L4-5 MAXIMUM ACCESS POSTERIOR LUMBAR INTERBODY FUSION  . TEE WITHOUT CARDIOVERSION N/A 12/19/2018   Procedure: TRANSESOPHAGEAL ECHOCARDIOGRAM (TEE);  Surgeon: Donata Clay, Theron Arista, MD;  Location: [facility] OR;  Service: Open Heart Surgery;  Laterality: N/A;  . VAGINAL HYSTERECTOMY  1970s   uterine prolapse    Family History  Problem Relation Age of Onset  . Mental illness Daughter   . Drug abuse Grandchild   . Heart disease Mother 80       died CHF age 44  . Hyperlipidemia Sister   . Hypertension Sister   . Cancer Father        Primary Cell Liver Cancer  . Diabetes Neg Hx     Social History Social History   Tobacco Use  . Smoking status: Never Smoker  . Smokeless tobacco: Never Used  Substance Use Topics  . Alcohol use: Yes    Alcohol/week: 1.0 standard drinks    Types: 1 Glasses of wine per week    Comment: 1-2 glass per night, sometimes none  . Drug use: No    Current Outpatient Medications  Medication Sig Dispense Refill  . ALPHAGAN P 0.1 % SOLN Place 1 drop into both eyes every 12 (twelve) hours.     . ALPRAZolam (XANAX) 0.25 MG tablet TAKE 1 TABLET BY MOUTH TWICE DAILY AS NEEDED FOR ANXIETY (Patient taking differently: Take 0.25 mg by mouth 2 (two) times daily as needed for anxiety. )  30 tablet 0  . aspirin EC 325 MG EC tablet Take 1 tablet (325 mg total) by mouth daily. 30 tablet 0  . atorvastatin (LIPITOR) 80 MG tablet Take 80 mg by mouth daily.    . Calcium Carb-Cholecalciferol (CALCIUM 600 + D PO) Take 1 tablet by mouth daily.     . carvedilol (COREG) 3.125 MG tablet Take 1 tablet (3.125 mg total) by mouth 2 (two) times daily with a meal. 60 tablet 1  . dorzolamide-timolol (COSOPT) 22.3-6.8 MG/ML ophthalmic solution Place 1 drop into both eyes 2 (two) times daily.     Marland Kitchen ELMIRON 100 MG capsule Take 100 mg by mouth 3 (three) times daily.    Marland Kitchen esomeprazole (NEXIUM) 40 MG capsule Take 1 capsule (40 mg total)  by mouth daily before breakfast. 90 capsule 3  . gabapentin (NEURONTIN) 100 MG capsule Take 2 capsules (200 mg total) by mouth 2 (two) times daily. 60 capsule 1  . hydrochlorothiazide (MICROZIDE) 12.5 MG capsule Take 1 capsule (12.5 mg total) by mouth daily. 90 capsule 3  . levothyroxine (SYNTHROID, LEVOTHROID) 25 MCG tablet Take 1 tablet (25 mcg total) by mouth daily at 6 (six) AM. 30 tablet 1  . Meth-Hyo-M Bl-Na Phos-Ph Sal (URIBEL) 118 MG CAPS Take 1 capsule by mouth 2 (two) times daily as needed (for pain).     . Multiple Vitamins-Minerals (MULTIVITAMIN WITH MINERALS) tablet Take 1 tablet by mouth daily.    . Netarsudil-Latanoprost 0.02-0.005 % SOLN Place 1 drop into both eyes at bedtime.    . vitamin B-12 (CYANOCOBALAMIN) 100 MCG tablet Take 100 mcg by mouth daily.    . ciprofloxacin (CIPRO) 250 MG tablet Take 1 tablet (250 mg total) by mouth 2 (two) times daily. 10 tablet 0  . furosemide (LASIX) 40 MG tablet Take 1 tablet (40 mg total) by mouth daily. 7 tablet 0   No current facility-administered medications for this visit.     No Known Allergies  Review of Systems  BP 134/79 (BP Location: Left Arm, Patient Position: Sitting, Cuff Size: Normal)   Pulse 98   Resp 16   Ht 5\' 4"  (1.626 m)   Wt 154 lb (69.9 kg)   SpO2 96% Comment: RA  BMI 26.43 kg/m  Physical Exam      Exam    General- alert and comfortable    Neck- no JVD, no cervical adenopathy palpable, no carotid bruit   Lungs- clear without rales, wheezes   Cor- regular rate and rhythm, no murmur , gallop   Abdomen- soft, non-tender   Extremities - warm, non-tender, minimal edema, L leg wound with granulation , 1/2 inch deep   Neuro- oriented, appropriate, no focal weakness   Diagnostic Tests: cxr clear  Impression: Good progress after CABG Daily wound care  Plan: Add cipro 250 bid  for serratia in Leg wound RTC 2 weeks Mikey Bussing, MD [facility] [phone]

## 2019-01-21 NOTE — Assessment & Plan Note (Signed)
LDL 97 on admission in feb 2020- on high dose statin Rx She will need recheck of her lipids in 6 weeks- she may be a candidate for Zetia or PCSK9

## 2019-01-21 NOTE — Assessment & Plan Note (Signed)
Pt expressed anxiety and concern about how she developed CAD and how she would be able to tell if it recurs since her admission symptoms were so vague.

## 2019-01-21 NOTE — Progress Notes (Signed)
01/21/2019 Melissa Gonzales   [birth year]  [id number]  Primary Physician Rodrigo Ran, MD Primary Cardiologist: Dr Duke Salvia  HPI: The patient is a pleasant 78 year old female who was admitted to the hospital 12/15/2018 with vague dyspnea and chest pain.  Her enzymes were negative.  CT scan suggested calcification in her LAD.  She ultimately underwent diagnostic catheterization which revealed three-vessel coronary disease and normal LV function.  She underwent bypass surgery x3 on December 19, 2018 with an LIMA-LAD, SVG-PDA, SVG-OM1.  The patient tolerated this well.  She was discharged to a skilled nursing facility for a period of time for rehab.  The patient is seen in the office today for follow-up.  Postoperatively she had some volume overload and was treated briefly with diuretics.  She says when she went home from the hospital she was 145 pounds, now 154 pounds.  She says she has lower extremity edema and fatigue with exertion.  She denies orthopnea or tachycardia.  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  During the interview was clear she was quite anxious about her illness.  She says she is not sure how to tell if she were to have progression of her disease.  Her symptoms were so vague preop that she is not sure she could recognize if she had a problem.  She was also concerned that she developed severe three-vessel coronary disease with minimal risk factors.   Current Outpatient Medications  Medication Sig Dispense Refill  . ALPHAGAN P 0.1 % SOLN Place 1 drop into both eyes every 12 (twelve) hours.     . ALPRAZolam (XANAX) 0.25 MG tablet TAKE 1 TABLET BY MOUTH TWICE DAILY AS NEEDED FOR ANXIETY (Patient taking differently: Take 0.25 mg by mouth 2 (two) times daily as needed for anxiety. ) 30 tablet 0  . aspirin EC 325 MG EC tablet Take 1 tablet (325 mg total) by mouth daily. 30 tablet 0  . atorvastatin (LIPITOR) 80 MG tablet Take 80  mg by mouth daily.    . Calcium Carb-Cholecalciferol (CALCIUM 600 + D PO) Take 1 tablet by mouth daily.     . carvedilol (COREG) 3.125 MG tablet Take 1 tablet (3.125 mg total) by mouth 2 (two) times daily with a meal. 60 tablet 1  . dorzolamide-timolol (COSOPT) 22.3-6.8 MG/ML ophthalmic solution Place 1 drop into both eyes 2 (two) times daily.     Marland Kitchen ELMIRON 100 MG capsule Take 100 mg by mouth 3 (three) times daily.    Marland Kitchen esomeprazole (NEXIUM) 40 MG capsule Take 1 capsule (40 mg total) by mouth daily before breakfast. 90 capsule 3  . gabapentin (NEURONTIN) 100 MG capsule Take 2 capsules (200 mg total) by mouth 2 (two) times daily. 60 capsule 1  . levothyroxine (SYNTHROID, LEVOTHROID) 25 MCG tablet Take 1 tablet (25 mcg total) by mouth daily at 6 (six) AM. 30 tablet 1  . Meth-Hyo-M Bl-Na Phos-Ph Sal (URIBEL) 118 MG CAPS Take 1 capsule by mouth 2 (two) times daily as needed (for pain).     . Multiple Vitamins-Minerals (MULTIVITAMIN WITH MINERALS) tablet Take 1 tablet by mouth daily.    . Netarsudil-Latanoprost 0.02-0.005 % SOLN Place 1 drop into both eyes at bedtime.    . traMADol (ULTRAM) 50 MG tablet Take 1 tablet (50 mg total) by mouth every 6 (six) hours as needed for moderate pain. 30 tablet 0  . vitamin B-12 (CYANOCOBALAMIN) 100 MCG tablet Take 100 mcg by  mouth daily.    . hydrochlorothiazide (MICROZIDE) 12.5 MG capsule Take 1 capsule (12.5 mg total) by mouth daily. 90 capsule 3  . potassium chloride SA (K-DUR,KLOR-CON) 20 MEQ tablet Take 1 tablet (20 mEq total) by mouth daily for 5 days. 5 tablet 0   No current facility-administered medications for this visit.     No Known Allergies  Past Medical History:  Diagnosis Date  . Anxiety   . Arthritis   . Depression   . Diverticulosis   . GERD (gastroesophageal reflux disease)   . Glaucoma    Bilateral eyes  . Hyperlipidemia   . Hypertension   . Peripheral neuropathy   . Pneumonia    hx of  . Urgency of urination     Social History    Socioeconomic History  . Marital status: Widowed    Spouse name: Not on file  . Number of children: 3  . Years of education: Not on file  . Highest education level: Not on file  Occupational History    Employer: RETIRED  Social Needs  . Financial resource strain: Not on file  . Food insecurity:    Worry: Not on file    Inability: Not on file  . Transportation needs:    Medical: Not on file    Non-medical: Not on file  Tobacco Use  . Smoking status: Never Smoker  . Smokeless tobacco: Never Used  Substance and Sexual Activity  . Alcohol use: Yes    Alcohol/week: 1.0 standard drinks    Types: 1 Glasses of wine per week    Comment: 1-2 glass per night, sometimes none  . Drug use: No  . Sexual activity: Never  Lifestyle  . Physical activity:    Days per week: Not on file    Minutes per session: Not on file  . Stress: Not on file  Relationships  . Social connections:    Talks on phone: Not on file    Gets together: Not on file    Attends religious service: Not on file    Active member of club or organization: Not on file    Attends meetings of clubs or organizations: Not on file    Relationship status: Not on file  . Intimate partner violence:    Fear of current or ex partner: Not on file    Emotionally abused: Not on file    Physically abused: Not on file    Forced sexual activity: Not on file  Other Topics Concern  . Not on file  Social History Narrative   Husband died suicide age 62s.  Currently in long term romantic relationship.  Support from 2 daughters and good relationship with grandchildren.  Other daughter has mental health issues, strained relationship.  Pt feels she is not making good decisions re: children and has tried to get CPS involved without relief.   1/3/13Lucila Maine (of daughter with mental health issues) facing jail time for selling drugs.  Pt has tried to support him and offered help with rehab or with changing colleges, but he refuses.        Family History  Problem Relation Age of Onset  . Mental illness Daughter   . Drug abuse Grandchild   . Heart disease Mother 26       died CHF age 85  . Hyperlipidemia Sister   . Hypertension Sister   . Cancer Father        Primary Cell Liver Cancer  . Diabetes Neg Hx  Review of Systems: General: negative for chills, fever, night sweats or weight changes.  Cardiovascular: negative for chest pain, dyspnea on exertion, edema, orthopnea, palpitations, paroxysmal nocturnal dyspnea or shortness of breath Dermatological: negative for rash Respiratory: negative for cough or wheezing Urologic: negative for hematuria Abdominal: negative for nausea, vomiting, diarrhea, bright red blood per rectum, melena, or hematemesis Neurologic: negative for visual changes, syncope, or dizziness All other systems reviewed and are otherwise negative except as noted above.    Blood pressure 110/64, pulse 97, temperature 97.8 F (36.6 C), height 5\' 4"  (1.626 m), weight 154 lb (69.9 kg).  General appearance: alert, cooperative and no distress Lungs: crackles Rt base Heart: regular rate and rhythm Extremities: trace edema Skin: Skin color, texture, turgor normal. No rashes or lesions Neurologic: Grossly normal  EKG [id number]  ASSESSMENT AND PLAN:   S/P CABG x 3 12/19/2018- LIMA-LAD, SVG-PDA, SVG-OM1 Normal LVF pre op  Essential hypertension Stable B/P post op- on low dose Coreg  Anxiety about health Pt expressed anxiety and concern about how she developed CAD and how she would be able to tell if it recurs since her admission symptoms were so vague.   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  She is concerned that she developed such high-grade disease with minimal symptoms.  I explained that even though she thinks she does not have risk factors for coronary disease, she did have dyslipidemia and treated hypertension.  I reassured her that she should continue to recover and get her strength back.  I explained it may take 3 to 6 months.  She was concerned about her weight gain and her lower extremity edema.  I ordered HCTZ 12.5 mg on Monday Wednesday and Friday.  I will see her back in about 6 weeks.  She does need follow-up lipids, her LDL was not at goal on high-dose statin when she was admitted.  Corine Shelter PA-C 01/21/2019 10:28 AM

## 2019-01-21 NOTE — Patient Instructions (Signed)
Medication Instructions:  START Hydrochlorothiazide 12.5mg  Take 1 tablet once a day  If you need a refill on your cardiac medications before your next appointment, please call your pharmacy.   Lab work: None  If you have labs (blood work) drawn today and your tests are completely normal, you will receive your results only by: Marland Kitchen MyChart Message (if you have MyChart) OR . A paper copy in the mail If you have any lab test that is abnormal or we need to change your treatment, we will call you to review the results.  Testing/Procedures: None   Follow-Up: At [facility], you and your health needs are our priority.  As part of our continuing mission to provide you with exceptional heart care, we have created designated Provider Care Teams.  These Care Teams include your primary Cardiologist (physician) and Advanced Practice Providers (APPs -  Physician Assistants and Nurse Practitioners) who all work together to provide you with the care you need, when you need it.  . Your physician recommends that you schedule a follow-up appointment in: 6 weeks with Melissa Shelter, PA-C  Any Other Special Instructions Will Be Listed Below (If Applicable).

## 2019-01-21 NOTE — Assessment & Plan Note (Signed)
Stable B/P post op- on low dose Coreg

## 2019-01-21 NOTE — Assessment & Plan Note (Signed)
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

## 2019-01-21 NOTE — Assessment & Plan Note (Signed)
12/19/2018- LIMA-LAD, SVG-PDA, SVG-OM1 Normal LVF pre op

## 2019-01-23 ENCOUNTER — Ambulatory Visit: Payer: Medicare Other | Admitting: Cardiothoracic Surgery

## 2019-01-28 ENCOUNTER — Ambulatory Visit: Payer: Medicare Other

## 2019-02-04 ENCOUNTER — Ambulatory Visit: Payer: Medicare Other | Admitting: Cardiothoracic Surgery

## 2019-02-04 ENCOUNTER — Other Ambulatory Visit: Payer: Self-pay

## 2019-02-05 ENCOUNTER — Telehealth (HOSPITAL_COMMUNITY): Payer: Self-pay | Admitting: *Deleted

## 2019-02-05 ENCOUNTER — Encounter: Payer: Self-pay | Admitting: Cardiothoracic Surgery

## 2019-02-05 ENCOUNTER — Ambulatory Visit (INDEPENDENT_AMBULATORY_CARE_PROVIDER_SITE_OTHER): Payer: Self-pay | Admitting: Cardiothoracic Surgery

## 2019-02-05 VITALS — BP 122/70 | HR 110 | Resp 16 | Ht 64.0 in | Wt 152.0 lb

## 2019-02-05 DIAGNOSIS — I2510 Atherosclerotic heart disease of native coronary artery without angina pectoris: Secondary | ICD-10-CM

## 2019-02-05 DIAGNOSIS — Z951 Presence of aortocoronary bypass graft: Secondary | ICD-10-CM

## 2019-02-05 DIAGNOSIS — Z4889 Encounter for other specified surgical aftercare: Secondary | ICD-10-CM

## 2019-02-05 MED ORDER — CARVEDILOL 6.25 MG PO TABS: 6.2500 mg | ORAL_TABLET | Freq: Two times a day (BID) | ORAL | 1 refills | Status: DC

## 2019-02-05 MED ORDER — FUROSEMIDE 40 MG PO TABS: 40.0000 mg | ORAL_TABLET | Freq: Every day | ORAL | 0 refills | Status: DC

## 2019-02-05 NOTE — Progress Notes (Signed)
PCP is Rodrigo Ran, MD Referring Provider is Chilton Si, MD  Chief Complaint  Patient presents with  . Routine Post Op    f/u to recheck leg wound after Cipro for Serratia    HPI: Scheduled postop follow-up 6 weeks after urgent multivessel CABG for unstable angina and severe CAD.  She has a postop left leg superficial infection at the vein harvest site which grew out Serratia for which she completed a course of oral Cipro.  The wound now is most healed.  Surrounding cellulitis resolved.  Patient has lower leg edema from bilateral vein harvest and probable some diastolic dysfunction.  She was given a repeat course of oral Lasix 40 mg a day for 2 weeks.  She understands the importance of keeping her legs elevated.  Patient has increased heart rate now with normal blood pressures over beta-blocker can be increased--Coreg will be increased from 3.125 mg twice daily to 6.25 mg twice daily.  The patient understands importance of walking every day at least 15 minutes.  Past Medical History:  Diagnosis Date  . Anxiety   . Arthritis   . Depression   . Diverticulosis   . GERD (gastroesophageal reflux disease)   . Glaucoma    Bilateral eyes  . Hyperlipidemia   . Hypertension   . Peripheral neuropathy   . Pneumonia    hx of  . Urgency of urination     Past Surgical History:  Procedure Laterality Date  . BACK SURGERY    . CHOLECYSTECTOMY  1993  . COLONOSCOPY    . CORONARY ARTERY BYPASS GRAFT N/A 12/19/2018   Procedure: CORONARY ARTERY BYPASS GRAFTING (CABG)x3, using left internal mammary arteryand right and left greater saphenous veins harvested endoscopically;  Surgeon: Kerin Perna, MD;  Location: [facility] OR;  Service: Open Heart Surgery;  Laterality: N/A;  . EYE SURGERY     Cataract Surgery Bilateral, 5 years ago  . LEFT HEART CATH AND CORONARY ANGIOGRAPHY N/A 12/17/2018   Procedure: LEFT HEART CATH AND CORONARY ANGIOGRAPHY;  Surgeon: Lennette Bihari, MD;  Location: [facility]  INVASIVE CV LAB;  Service: Cardiovascular;  Laterality: N/A;  . MAXIMUM ACCESS (MAS)POSTERIOR LUMBAR INTERBODY FUSION (PLIF) 1 LEVEL N/A 03/17/2015   Procedure: Lumbar five -sacral one Maximum access posterior lumbar interbody fusion/Lumbar two-three  Laminectomy and extension of instrumentation to Sacral-one;  Surgeon: Tia Alert, MD;  Location: [facility] NEURO ORS;  Service: Neurosurgery;  Laterality: N/A;  . MAXIMUM ACCESS (MAS)POSTERIOR LUMBAR INTERBODY FUSION (PLIF) 2 LEVEL N/A 11/27/2014   Procedure: LUMBAR THREE-FOUR, LUMBAR FOUR-FIVE MAXIMUM ACCESS POSTERIOR LUMBAR INTERBODY FUSION;  Surgeon: Tia Alert, MD;  Location: [facility];  Service: Neurosurgery;  Laterality: N/A;  L3-4 L4-5 MAXIMUM ACCESS POSTERIOR LUMBAR INTERBODY FUSION  . TEE WITHOUT CARDIOVERSION N/A 12/19/2018   Procedure: TRANSESOPHAGEAL ECHOCARDIOGRAM (TEE);  Surgeon: Donata Clay, Theron Arista, MD;  Location: [facility] OR;  Service: Open Heart Surgery;  Laterality: N/A;  . VAGINAL HYSTERECTOMY  1970s   uterine prolapse    Family History  Problem Relation Age of Onset  . Mental illness Daughter   . Drug abuse Grandchild   . Heart disease Mother 55       died CHF age 59  . Hyperlipidemia Sister   . Hypertension Sister   . Cancer Father        Primary Cell Liver Cancer  . Diabetes Neg Hx     Social History Social History   Tobacco Use  . Smoking status: Never Smoker  .  Smokeless tobacco: Never Used  Substance Use Topics  . Alcohol use: Yes    Alcohol/week: 1.0 standard drinks    Types: 1 Glasses of wine per week    Comment: 1-2 glass per night, sometimes none  . Drug use: No    Current Outpatient Medications  Medication Sig Dispense Refill  . ALPHAGAN P 0.1 % SOLN Place 1 drop into both eyes every 12 (twelve) hours.     . ALPRAZolam (XANAX) 0.25 MG tablet TAKE 1 TABLET BY MOUTH TWICE DAILY AS NEEDED FOR ANXIETY (Patient taking differently: Take 0.25 mg by mouth 2 (two) times daily as needed for anxiety. ) 30 tablet 0  .  aspirin EC 325 MG EC tablet Take 1 tablet (325 mg total) by mouth daily. 30 tablet 0  . atorvastatin (LIPITOR) 80 MG tablet Take 80 mg by mouth daily.    . Calcium Carb-Cholecalciferol (CALCIUM 600 + D PO) Take 1 tablet by mouth daily.     . carvedilol (COREG) 3.125 MG tablet Take 1 tablet (3.125 mg total) by mouth 2 (two) times daily with a meal. 60 tablet 1  . dorzolamide-timolol (COSOPT) 22.3-6.8 MG/ML ophthalmic solution Place 1 drop into both eyes 2 (two) times daily.     Marland Kitchen ELMIRON 100 MG capsule Take 100 mg by mouth 3 (three) times daily.    Marland Kitchen esomeprazole (NEXIUM) 40 MG capsule Take 1 capsule (40 mg total) by mouth daily before breakfast. 90 capsule 3  . gabapentin (NEURONTIN) 100 MG capsule Take 2 capsules (200 mg total) by mouth 2 (two) times daily. 60 capsule 1  . hydrochlorothiazide (MICROZIDE) 12.5 MG capsule Take 1 capsule (12.5 mg total) by mouth daily. 90 capsule 3  . levothyroxine (SYNTHROID, LEVOTHROID) 25 MCG tablet Take 1 tablet (25 mcg total) by mouth daily at 6 (six) AM. 30 tablet 1  . Meth-Hyo-M Bl-Na Phos-Ph Sal (URIBEL) 118 MG CAPS Take 1 capsule by mouth 2 (two) times daily as needed (for pain).     . Multiple Vitamins-Minerals (MULTIVITAMIN WITH MINERALS) tablet Take 1 tablet by mouth daily.    . Netarsudil-Latanoprost 0.02-0.005 % SOLN Place 1 drop into both eyes at bedtime.    . vitamin B-12 (CYANOCOBALAMIN) 100 MCG tablet Take 100 mcg by mouth daily.     No current facility-administered medications for this visit.     No Known Allergies  Review of Systems   Patient states her ankle edema has returned after stopping her Lasix.  No symptoms of angina.  No orthopnea or DOE  BP 122/70 (BP Location: Right Arm, Patient Position: Sitting, Cuff Size: Large)   Pulse (!) 110   Resp 16   Ht 5\' 4"  (1.626 m)   Wt 152 lb (68.9 kg)   SpO2 96% Comment: ON RA  BMI 26.09 kg/m  Physical Exam Alert and comfortable Lungs clear Heart rate increased without murmur or  gallop Sternal incision well-healed Left leg incision almost healed-continue Neosporin and a Band-Aid.  I applied silver nitrate and Neosporin to the incision today.  Diagnostic Tests: None  Impression: Slow but gradual progress after urgent CABG 6 weeks ago  Plan: Continue with above med changes and return for review of progress in 4 weeks.  Mikey Bussing, MD [facility] [phone]

## 2019-02-05 NOTE — Telephone Encounter (Signed)
Called and spoke with pt regarding referral we have received.  Pt is interested.  Advised that the department is closed to patients due to Covid-19.  Once we have resumed scheduling, he will be contacted.  Pt agreeable and verbalized understanding. Carlette Carlton RN, BSN Cardiac and Pulmonary Rehab Nurse Navigator    

## 2019-02-10 ENCOUNTER — Other Ambulatory Visit: Payer: Self-pay | Admitting: Cardiothoracic Surgery

## 2019-02-10 ENCOUNTER — Other Ambulatory Visit: Payer: Self-pay | Admitting: *Deleted

## 2019-02-10 DIAGNOSIS — R600 Localized edema: Secondary | ICD-10-CM

## 2019-02-10 DIAGNOSIS — I209 Angina pectoris, unspecified: Secondary | ICD-10-CM

## 2019-02-10 MED ORDER — CARVEDILOL 6.25 MG PO TABS: 6.2500 mg | ORAL_TABLET | Freq: Two times a day (BID) | ORAL | 1 refills | Status: DC

## 2019-02-10 MED ORDER — FUROSEMIDE 40 MG PO TABS: 40.0000 mg | ORAL_TABLET | Freq: Every day | ORAL | 0 refills | Status: DC

## 2019-03-05 ENCOUNTER — Telehealth: Payer: Self-pay | Admitting: Cardiology

## 2019-03-07 ENCOUNTER — Encounter: Payer: Self-pay | Admitting: Cardiology

## 2019-03-07 ENCOUNTER — Telehealth: Payer: Self-pay

## 2019-03-07 ENCOUNTER — Telehealth (INDEPENDENT_AMBULATORY_CARE_PROVIDER_SITE_OTHER): Payer: Medicare Other | Admitting: Cardiology

## 2019-03-07 VITALS — BP 130/80 | HR 88 | Ht 64.0 in | Wt 148.0 lb

## 2019-03-07 DIAGNOSIS — F418 Other specified anxiety disorders: Secondary | ICD-10-CM

## 2019-03-07 DIAGNOSIS — Z951 Presence of aortocoronary bypass graft: Secondary | ICD-10-CM

## 2019-03-07 DIAGNOSIS — G629 Polyneuropathy, unspecified: Secondary | ICD-10-CM

## 2019-03-07 DIAGNOSIS — E785 Hyperlipidemia, unspecified: Secondary | ICD-10-CM

## 2019-03-07 DIAGNOSIS — I10 Essential (primary) hypertension: Secondary | ICD-10-CM

## 2019-03-07 NOTE — Patient Instructions (Addendum)
Medication Instructions:  Your physician recommends that you continue on your current medications as directed. Please refer to the Current Medication list given to you today. If you need a refill on your cardiac medications before your next appointment, please call your pharmacy.   Lab work: None  If you have labs (blood work) drawn today and your tests are completely normal, you will receive your results only by: . MyChart Message (if you have MyChart) OR . A paper copy in the mail If you have any lab test that is abnormal or we need to change your treatment, we will call you to review the results.  Testing/Procedures: None   Follow-Up: At [facility], you and your health needs are our priority.  As part of our continuing mission to provide you with exceptional heart care, we have created designated Provider Care Teams.  These Care Teams include your primary Cardiologist (physician) and Advanced Practice Providers (APPs -  Physician Assistants and Nurse Practitioners) who all work together to provide you with the care you need, when you need it. You will need a follow up appointment in 6 months.  Please call our office 2 months in advance to schedule this appointment.  You may see Tiffany Spencer, MD or one of the following Advanced Practice Providers on your designated Care Team:   Luke Kilroy, PA-C Krista Kroeger, PA-C . Callie Goodrich, PA-C  Any Other Special Instructions Will Be Listed Below (If Applicable).    

## 2019-03-07 NOTE — Progress Notes (Signed)
Virtual Visit via Telephone Note   This visit type was conducted due to national recommendations for restrictions regarding the COVID-19 Pandemic (e.g. social distancing) in an effort to limit this patient's exposure and mitigate transmission in our community.  Due to her co-morbid illnesses, this patient is at least at moderate risk for complications without adequate follow up.  This format is felt to be most appropriate for this patient at this time.  The patient did not have access to video technology/had technical difficulties with video requiring transitioning to audio format only (telephone).  All issues noted in this document were discussed and addressed.  No physical exam could be performed with this format.  Please refer to the patient's chart for her  consent to telehealth for [facility].   Date:  03/07/2019   ID:  Melissa Gonzales, DOB Jul 10, 1941, MRN [id number]  Patient Location: Home Provider Location: Home  PCP:  Rodrigo RanPerini, Mark, MD  Cardiologist:  Chilton Siiffany Hartman, MD  Electrophysiologist:  None   Evaluation Performed:  Follow-Up Visit  Chief Complaint:  Follow up- fatigue  History of Present Illness:    Melissa Gonzales is a 78 y.o. female who was admitted to the hospital 12/15/2018 with vague dyspnea and chest pain.  Her enzymes were negative.  CT scan suggested calcification in her LAD.  She ultimately underwent diagnostic catheterization which revealed three-vessel coronary disease and normal LV function.  She underwent bypass surgery x3 on December 19, 2018 with an LIMA-LAD, SVG-PDA, SVG-OM1.  The patient tolerated this well.  She was discharged to a skilled nursing facility for a period of time for rehab.  She was seen in follow up 01/21/2019 and complained of fatigue, weight, gain, trouble sleeping, and LE edema.  Pre op she was 145 lbs, when I saw her she was 154 lbs.  I added HCTZ daily.  She saw Dr Morton PetersVan Tright on 02/05/2019 and was doing a little better, less edema and her  weight was coming down.  She was contacted today for a follow-up visit.  She says that her primary care provider has drawn labs on her a couple weeks ago and told her her labs look good.  I do not have access to those labs.  She continues to be a little bit frustrated by fatigue.  She also says she is not sleeping well.  She admits she has become somewhat depressed, the current pandemic locked down has not helped issues at all.  She is going to contact her psychiatrist who seen her for depression in the past.  Otherwise from a cardiac standpoint she is doing well, no unusual dyspnea or tachycardia.  She is tolerating her medications and her blood pressure and vital signs look good.  The patient does not have symptoms concerning for COVID-19 infection (fever, chills, cough, or new shortness of breath).    Past Medical History:  Diagnosis Date   Anxiety    Arthritis    Depression    Diverticulosis    GERD (gastroesophageal reflux disease)    Glaucoma    Bilateral eyes   Hyperlipidemia    Hypertension    Peripheral neuropathy    Pneumonia    hx of   Urgency of urination    Past Surgical History:  Procedure Laterality Date   BACK SURGERY     CHOLECYSTECTOMY  1993   COLONOSCOPY     CORONARY ARTERY BYPASS GRAFT N/A 12/19/2018   Procedure: CORONARY ARTERY BYPASS GRAFTING (CABG)x3, using left internal mammary  arteryand right and left greater saphenous veins harvested endoscopically;  Surgeon: Kerin Perna, MD;  Location: [facility] OR;  Service: Open Heart Surgery;  Laterality: N/A;   EYE SURGERY     Cataract Surgery Bilateral, 5 years ago   LEFT HEART CATH AND CORONARY ANGIOGRAPHY N/A 12/17/2018   Procedure: LEFT HEART CATH AND CORONARY ANGIOGRAPHY;  Surgeon: Lennette Bihari, MD;  Location: [facility] INVASIVE CV LAB;  Service: Cardiovascular;  Laterality: N/A;   MAXIMUM ACCESS (MAS)POSTERIOR LUMBAR INTERBODY FUSION (PLIF) 1 LEVEL N/A 03/17/2015   Procedure: Lumbar five -sacral one  Maximum access posterior lumbar interbody fusion/Lumbar two-three  Laminectomy and extension of instrumentation to Sacral-one;  Surgeon: Tia Alert, MD;  Location: [facility];  Service: Neurosurgery;  Laterality: N/A;   MAXIMUM ACCESS (MAS)POSTERIOR LUMBAR INTERBODY FUSION (PLIF) 2 LEVEL N/A 11/27/2014   Procedure: LUMBAR THREE-FOUR, LUMBAR FOUR-FIVE MAXIMUM ACCESS POSTERIOR LUMBAR INTERBODY FUSION;  Surgeon: Tia Alert, MD;  Location: [facility];  Service: Neurosurgery;  Laterality: N/A;  L3-4 L4-5 MAXIMUM ACCESS POSTERIOR LUMBAR INTERBODY FUSION   TEE WITHOUT CARDIOVERSION N/A 12/19/2018   Procedure: TRANSESOPHAGEAL ECHOCARDIOGRAM (TEE);  Surgeon: Donata Clay, Theron Arista, MD;  Location: [facility] OR;  Service: Open Heart Surgery;  Laterality: N/A;   VAGINAL HYSTERECTOMY  1970s   uterine prolapse     Current Meds  Medication Sig   ALPHAGAN P 0.1 % SOLN Place 1 drop into both eyes every 12 (twelve) hours.    ALPRAZolam (XANAX) 0.25 MG tablet TAKE 1 TABLET BY MOUTH TWICE DAILY AS NEEDED FOR ANXIETY (Patient taking differently: Take 0.25 mg by mouth 2 (two) times daily as needed for anxiety. )   aspirin EC 325 MG EC tablet Take 1 tablet (325 mg total) by mouth daily.   atorvastatin (LIPITOR) 80 MG tablet Take 80 mg by mouth daily.   Calcium Carb-Cholecalciferol (CALCIUM 600 + D PO) Take 1 tablet by mouth daily.    carvedilol (COREG) 6.25 MG tablet Take 1 tablet (6.25 mg total) by mouth 2 (two) times daily with a meal.   dorzolamide-timolol (COSOPT) 22.3-6.8 MG/ML ophthalmic solution Place 1 drop into both eyes 2 (two) times daily.    ELMIRON 100 MG capsule Take 100 mg by mouth 3 (three) times daily.   esomeprazole (NEXIUM) 40 MG capsule Take 1 capsule (40 mg total) by mouth daily before breakfast.   Fluocinolone Acetonide Scalp 0.01 % OIL    hydrochlorothiazide (MICROZIDE) 12.5 MG capsule Take 1 capsule (12.5 mg total) by mouth daily.   levothyroxine (SYNTHROID, LEVOTHROID) 25 MCG tablet  Take 1 tablet (25 mcg total) by mouth daily at 6 (six) AM.   Meth-Hyo-M Bl-Na Phos-Ph Sal (URIBEL) 118 MG CAPS Take 1 capsule by mouth 2 (two) times daily as needed (for pain).    Multiple Vitamins-Minerals (MULTIVITAMIN WITH MINERALS) tablet Take 1 tablet by mouth daily.   Netarsudil-Latanoprost 0.02-0.005 % SOLN Place 1 drop into both eyes at bedtime.   nitrofurantoin (MACRODANTIN) 100 MG capsule Take 1 capsule by mouth at bedtime.   triamcinolone cream (KENALOG) 0.1 %    vitamin B-12 (CYANOCOBALAMIN) 100 MCG tablet Take 100 mcg by mouth daily.     Allergies:   Patient has no known allergies.   Social History   Tobacco Use   Smoking status: Never Smoker   Smokeless tobacco: Never Used  Substance Use Topics   Alcohol use: Yes    Alcohol/week: 1.0 standard drinks    Types: 1 Glasses of wine per week  Comment: 1-2 glass per night, sometimes none   Drug use: No     Family Hx: The patient's family history includes Cancer in her father; Drug abuse in her grandchild; Heart disease (age of onset: 46) in her mother; Hyperlipidemia in her sister; Hypertension in her sister; Mental illness in her daughter. There is no history of Diabetes.  ROS:   Please see the history of present illness.    All other systems reviewed and are negative.   Prior CV studies:   The following studies were reviewed today:   Labs/Other Tests and Data Reviewed:    EKG:  No ECG reviewed.  Recent Labs: 12/14/2018: TSH 5.337 12/20/2018: Magnesium 2.2 12/24/2018: ALT 22 12/26/2018: BUN 7; Creatinine, Ser 0.54; Hemoglobin 8.2; Platelets 285; Potassium 3.6; Sodium 137   Recent Lipid Panel Lab Results  Component Value Date/Time   CHOL 177 12/15/2018 02:59 AM   TRIG 116 12/15/2018 02:59 AM   HDL 57 12/15/2018 02:59 AM   CHOLHDL 3.1 12/15/2018 02:59 AM   LDLCALC 97 12/15/2018 02:59 AM   LDLDIRECT 118 (H) 06/02/2008 08:46 PM    Wt Readings from Last 3 Encounters:  03/07/19 148 lb (67.1 kg)    02/05/19 152 lb (68.9 kg)  01/21/19 154 lb (69.9 kg)     Objective:    Vital Signs:  BP 130/80    Pulse 88    Ht 5\' 4"  (1.626 m)    Wt 148 lb (67.1 kg)    BMI 25.40 kg/m    VITAL SIGNS:  reviewed  ASSESSMENT & PLAN:    Post CABG depression- I tried to reassure her she will eventually feel normal physically- it may take 3-6 months s/p surgery.  I encouraged to f/u with her psychiatrist.  S/P CABG x 3 12/19/2018- LIMA-LAD, SVG-PDA, SVG-OM1 Normal LVF pre op  Essential hypertension Stable B/P post op- Coreg increased to 6.25 mg BID 02/05/2019.  She is also on HCTZ  Anxiety about health Pt expressed anxiety and concern about how she developed CAD and how she would be able to tell if it recurs since her admission symptoms were so vague.   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  The importance of social distancing was discussed today.  Time:   Today, I have spent 15 minutes with the patient with telehealth technology discussing the above problems.     Medication Adjustments/Labs and Tests Ordered: Current medicines are reviewed at length with the patient today.  Concerns regarding medicines are outlined above.   Tests Ordered: No orders of the defined types were placed in this encounter.   Medication Changes: No orders of the defined types were placed in this encounter.   Disposition:  Follow up Dr Duke Salvia in October  Signed, Hanny Elsberry [place], [place]  03/07/2019 1:30 PM    [facility]

## 2019-03-07 NOTE — Telephone Encounter (Signed)
Contacted patient to discuss AVS instructions. Patient notified and voiced understanding. 

## 2019-03-11 ENCOUNTER — Other Ambulatory Visit: Payer: Self-pay

## 2019-03-12 ENCOUNTER — Ambulatory Visit (INDEPENDENT_AMBULATORY_CARE_PROVIDER_SITE_OTHER): Payer: Self-pay | Admitting: Cardiothoracic Surgery

## 2019-03-12 ENCOUNTER — Encounter: Payer: Self-pay | Admitting: Cardiothoracic Surgery

## 2019-03-12 VITALS — BP 138/64 | HR 88 | Temp 97.7°F | Resp 20 | Ht 64.0 in | Wt 150.0 lb

## 2019-03-12 DIAGNOSIS — Z5189 Encounter for other specified aftercare: Secondary | ICD-10-CM

## 2019-03-12 DIAGNOSIS — Z951 Presence of aortocoronary bypass graft: Secondary | ICD-10-CM

## 2019-03-12 DIAGNOSIS — I2510 Atherosclerotic heart disease of native coronary artery without angina pectoris: Secondary | ICD-10-CM

## 2019-03-12 NOTE — Progress Notes (Signed)
PCP is Rodrigo RanPerini, Mark, MD Referring Provider is Chilton Siandolph, Tiffany, MD  Chief Complaint  Patient presents with  . Routine Post Op    1 month f/u, HX of CABG    HPI: 6222-month final postop visit after CABG x3 for unstable angina and severe diffuse coronary disease.  LV function fairly well preserved.  2+ aortic insufficiency.  Patient had slow postoperative recovery and was in a skilled nursing facility for a period of time.  She is now back home living independently.  She has no symptoms recurrent angina but she has having problems with anxiety, insomnia, depression, and fatigue.  She states she walks 30 minutes a day.  Incisions are healed.  Last chest x-ray is clear.  She is in sinus rhythm.  Postoperative lower extremity edema has resolved.  She is a patient of Dr. Leonides Sakeandolph's and has an appointment to follow-up this fall.  Unfortunately she is unable to start the phase 2 outpatient cardiac rehab program because of the COVID restrictions.  She is on the list to be called once the program reinitiates and she will be able to drive herself and participate.  She would benefit greatly from phase 2 cardiac rehab in many ways.  Past Medical History:  Diagnosis Date  . Anxiety   . Arthritis   . Depression   . Diverticulosis   . GERD (gastroesophageal reflux disease)   . Glaucoma    Bilateral eyes  . Hyperlipidemia   . Hypertension   . Peripheral neuropathy   . Pneumonia    hx of  . Urgency of urination     Past Surgical History:  Procedure Laterality Date  . BACK SURGERY    . CHOLECYSTECTOMY  1993  . COLONOSCOPY    . CORONARY ARTERY BYPASS GRAFT N/A 12/19/2018   Procedure: CORONARY ARTERY BYPASS GRAFTING (CABG)x3, using left internal mammary arteryand right and left greater saphenous veins harvested endoscopically;  Surgeon: Kerin PernaVan Trigt, Peter, MD;  [facility];  Service: Open Heart Surgery;  Laterality: N/A;  . EYE SURGERY     Cataract Surgery Bilateral, 5 years ago  . LEFT HEART CATH  AND CORONARY ANGIOGRAPHY N/A 12/17/2018   Procedure: LEFT HEART CATH AND CORONARY ANGIOGRAPHY;  Surgeon: Lennette BihariKelly, Thomas A, MD;  Location: [facility] INVASIVE CV LAB;  Service: Cardiovascular;  Laterality: N/A;  . MAXIMUM ACCESS (MAS)POSTERIOR LUMBAR INTERBODY FUSION (PLIF) 1 LEVEL N/A 03/17/2015   Procedure: Lumbar five -sacral one Maximum access posterior lumbar interbody fusion/Lumbar two-three  Laminectomy and extension of instrumentation to Sacral-one;  Surgeon: Tia Alertavid S Jones, MD;  Location: [facility] NEURO ORS;  Service: Neurosurgery;  Laterality: N/A;  . MAXIMUM ACCESS (MAS)POSTERIOR LUMBAR INTERBODY FUSION (PLIF) 2 LEVEL N/A 11/27/2014   Procedure: LUMBAR THREE-FOUR, LUMBAR FOUR-FIVE MAXIMUM ACCESS POSTERIOR LUMBAR INTERBODY FUSION;  Surgeon: Tia Alertavid S Jones, MD;  Location: [facility] NEURO ORS;  Service: Neurosurgery;  Laterality: N/A;  L3-4 L4-5 MAXIMUM ACCESS POSTERIOR LUMBAR INTERBODY FUSION  . TEE WITHOUT CARDIOVERSION N/A 12/19/2018   Procedure: TRANSESOPHAGEAL ECHOCARDIOGRAM (TEE);  Surgeon: Donata ClayVan Trigt, Theron AristaPeter, MD;  [facility];  Service: Open Heart Surgery;  Laterality: N/A;  . VAGINAL HYSTERECTOMY  1970s   uterine prolapse    Family History  Problem Relation Age of Onset  . Mental illness Daughter   . Drug abuse Grandchild   . Heart disease Mother [id number]       died CHF age 79  . Hyperlipidemia Sister   . Hypertension Sister   . Cancer Father  Primary Cell Liver Cancer  . Diabetes Neg Hx     Social History Social History   Tobacco Use  . Smoking status: Never Smoker  . Smokeless tobacco: Never Used  Substance Use Topics  . Alcohol use: Yes    Alcohol/week: 1.0 standard drinks    Types: 1 Glasses of wine per week    Comment: 1-2 glass per night, sometimes none  . Drug use: No    Current Outpatient Medications  Medication Sig Dispense Refill  . ALPHAGAN P 0.1 % SOLN Place 1 drop into both eyes every 12 (twelve) hours.     . ALPRAZolam (XANAX) 0.25 MG tablet TAKE 1 TABLET BY MOUTH TWICE  DAILY AS NEEDED FOR ANXIETY (Patient taking differently: Take 0.25 mg by mouth 2 (two) times daily as needed for anxiety. ) 30 tablet 0  . aspirin EC 325 MG EC tablet Take 1 tablet (325 mg total) by mouth daily. 30 tablet 0  . atorvastatin (LIPITOR) 80 MG tablet Take 80 mg by mouth daily.    . Calcium Carb-Cholecalciferol (CALCIUM 600 + D PO) Take 1 tablet by mouth daily.     . carvedilol (COREG) 6.25 MG tablet Take 1 tablet (6.25 mg total) by mouth 2 (two) times daily with a meal. 60 tablet 1  . dorzolamide-timolol (COSOPT) 22.3-6.8 MG/ML ophthalmic solution Place 1 drop into both eyes 2 (two) times daily.     Marland Kitchen ELMIRON 100 MG capsule Take 100 mg by mouth 3 (three) times daily.    Marland Kitchen escitalopram (LEXAPRO) 10 MG tablet Take 10 mg by mouth daily.    Marland Kitchen esomeprazole (NEXIUM) 40 MG capsule Take 1 capsule (40 mg total) by mouth daily before breakfast. 90 capsule 3  . Fluocinolone Acetonide Scalp 0.01 % OIL     . hydrochlorothiazide (MICROZIDE) 12.5 MG capsule Take 1 capsule (12.5 mg total) by mouth daily. 90 capsule 3  . levothyroxine (SYNTHROID, LEVOTHROID) 25 MCG tablet Take 1 tablet (25 mcg total) by mouth daily at 6 (six) AM. 30 tablet 1  . Meth-Hyo-M Bl-Na Phos-Ph Sal (URIBEL) 118 MG CAPS Take 1 capsule by mouth 2 (two) times daily as needed (for pain).     . Multiple Vitamins-Minerals (MULTIVITAMIN WITH MINERALS) tablet Take 1 tablet by mouth daily.    . Netarsudil-Latanoprost 0.02-0.005 % SOLN Place 1 drop into both eyes at bedtime.    . nitrofurantoin (MACRODANTIN) 100 MG capsule Take 1 capsule by mouth at bedtime.    . triamcinolone cream (KENALOG) 0.1 %     . vitamin B-12 (CYANOCOBALAMIN) 100 MCG tablet Take 100 mcg by mouth daily.     No current facility-administered medications for this visit.     No Known Allergies  Review of Systems   No symptoms of COVID virus infection-fever chills cough new onset shortness of breath nausea loss of taste  BP 138/64   Pulse 88   Temp 97.7 F  (36.5 C) (Skin)   Resp 20   Ht  (1.626 m)   Wt 150 lb (68 kg)   SpO2 95% Comment: RA  BMI 25.75 kg/m  Physical Exam      Exam    General- alert and comfortable    Neck- no JVD, no cervical adenopathy palpable, no carotid bruit   Lungs- clear without rales, wheezes   Cor- regular rate and rhythm, no murmur , gallop   Abdomen- soft, non-tender   Extremities - warm, non-tender, minimal edema   Neuro- oriented, appropriate, no focal  weakness   Diagnostic Tests: None.  Last chest x-ray earlier this year personally reviewed showing clear lung fields  Impression: Patient has recovered now 3 months postop urgent CABG x3. She will start outpatient cardiac rehab once the program starts back up after the COVID restrictions are lifted. Plan: She will return to the care of her primary care physician and cardiologist and return here as needed.  She understands that now that she is 3 months postop she can resume normal activities without sternal restrictions or precautions.  She understands the importance of a heart healthy lifestyle including regular aerobic activity 30 minutes 5 days a week and heart healthy diet.   Mikey Bussing, MD [facility] [phone]

## 2019-04-09 ENCOUNTER — Telehealth (HOSPITAL_COMMUNITY): Payer: Self-pay | Admitting: *Deleted

## 2019-04-09 NOTE — Telephone Encounter (Signed)
Pt left message on voicemail regarding cardiac rehab.  Called and informed pt that we have not yet been given the permission to schedule for group exercise due to covid-19.  But we do have a wonderful opportunity at no cost to her. Virtual cardiac rehab. Brief overview of what this entails.  Pt excited to get started.  Pt has access to smart device, reliable Internet and has some confidence in navigating apps.  Advised Blessed that we have begun enrolling this week and we are starting with the older referral and are moving forward.  She will be contacted soon for scheduling. Pt verbalized understanding. Alanson Aly, BSN Cardiac and Emergency planning/management officer

## 2019-04-15 ENCOUNTER — Telehealth (HOSPITAL_COMMUNITY): Payer: Self-pay | Admitting: *Deleted

## 2019-04-15 ENCOUNTER — Encounter (HOSPITAL_COMMUNITY): Payer: Self-pay

## 2019-04-15 NOTE — Telephone Encounter (Signed)
  Called pt for scheduling virtual cardiac rehab intake appt.            Confirm Consent - In the setting of the current Covid19 crisis, you are scheduled for a phone visit with your Cardiac or Pulmonary team member.  Just as we do with many in-gym visits, in order for you to participate in this visit, we must obtain consent.  If you'd like, I can send this to your mychart (if signed up) or email for you to review.  Otherwise, I can obtain your verbal consent now.  By agreeing to a telephone visit, we'd like you to understand that the technology does not allow for your Cardiac or Pulmonary Rehab team member to perform a physical assessment, and thus may limit their ability to fully assess your ability to perform exercise programs. If your provider identifies any concerns that need to be evaluated in person, we will make arrangements to do so.  Finally, though the technology is pretty good, we cannot assure that it will always work on either your or our end and we cannot ensure that we have a secure connection.  Cardiac and Pulmonary Rehab Telehealth visits and "At Home" cardiac and pulmonary rehab are provided at no cost to you.        Are you willing to proceed?"        STAFF: Did the patient verbally acknowledge consent to telehealth visit? Document YES/NO here: Yes    Maurice Small RN, BSN Cardiac and Pulmonary Rehab Nurse Navigator    Cardiac and Pulmonary Rehab Staff        Date 04/15/19    @  Time 1135

## 2019-04-16 ENCOUNTER — Telehealth (HOSPITAL_COMMUNITY): Payer: Self-pay | Admitting: *Deleted

## 2019-04-16 ENCOUNTER — Encounter (HOSPITAL_COMMUNITY): Payer: Self-pay

## 2019-05-02 ENCOUNTER — Telehealth (HOSPITAL_COMMUNITY): Payer: Self-pay

## 2019-05-02 NOTE — Telephone Encounter (Signed)
Pt insurance is active and benefits verified through [facility]. Co-pay $20.00, DED $0.00/$0.00 met, out of pocket $3,600.00/$1,884.17 met, co-insurance 0%. No pre-authorization required. Passport, 05/02/2019 @ 10:26AM, JJO#[id number]

## 2019-05-05 NOTE — Telephone Encounter (Signed)
Opened in error

## 2019-05-13 ENCOUNTER — Telehealth (HOSPITAL_COMMUNITY): Payer: Self-pay

## 2019-05-14 ENCOUNTER — Telehealth (HOSPITAL_COMMUNITY): Payer: Self-pay | Admitting: *Deleted

## 2019-05-14 NOTE — Telephone Encounter (Signed)
Cardiac Rehab Medication Review by a Pharmacist  Does the patient  feel that his/her medications are working for him/her?  yes  Has the patient been experiencing any side effects to the medications prescribed?  no  Does the patient measure his/her own blood pressure or blood glucose at home?  BP: yes - 130/70s  Does the patient have any problems obtaining medications due to transportation or finances?   no  Understanding of regimen: good Understanding of indications: good Potential of compliance: good   Pharmacist comments: none  Vertis Kelch, PharmD PGY2 Cardiology Pharmacy Resident Phone [phone] 05/14/2019       9:32 AM  Please check [email or web address] for unit-specific pharmacist phone numbers

## 2019-05-14 NOTE — Telephone Encounter (Signed)
Called to go over health assessment prior to patient starting Cardiac Rehab orientation 05/15/2019.

## 2019-05-15 ENCOUNTER — Other Ambulatory Visit: Payer: Self-pay

## 2019-05-15 ENCOUNTER — Encounter (HOSPITAL_COMMUNITY)
Admission: RE | Admit: 2019-05-15 | Discharge: 2019-05-15 | Disposition: A | Discharge status: Home / Self Care | Payer: Medicare Other | Source: Ambulatory Visit | Attending: Cardiovascular Disease | Admitting: Cardiovascular Disease

## 2019-05-15 DIAGNOSIS — Z951 Presence of aortocoronary bypass graft: Secondary | ICD-10-CM | POA: Insufficient documentation

## 2019-05-15 NOTE — Progress Notes (Signed)
Patient reported to orientation this morning wearing sandals. Walk test was not completed due to safety reasons. Melissa Gonzales completed paper work and measurements. Melissa Gonzales will return to cardiac rehab next Tuesday to complete her walk test.Melissa Venetia Maxon, RN,BSN 05/15/2019 11:27 AM

## 2019-05-19 ENCOUNTER — Encounter (HOSPITAL_COMMUNITY): Payer: Medicare Other

## 2019-05-20 ENCOUNTER — Telehealth (HOSPITAL_COMMUNITY): Payer: Self-pay

## 2019-05-20 NOTE — Telephone Encounter (Signed)
Patient spoke to Vertis Kelch, PharmD on 7/7 about medications. Confirmed, no changes since this encounter.   Thanks,  Acey Lav, PharmD  PGY1 Pharmacy Resident [facility] [phone]

## 2019-05-21 ENCOUNTER — Encounter (HOSPITAL_COMMUNITY): Payer: Medicare Other

## 2019-05-22 ENCOUNTER — Encounter (HOSPITAL_COMMUNITY)
Admission: RE | Admit: 2019-05-22 | Discharge: 2019-05-22 | Disposition: A | Discharge status: Home / Self Care | Payer: Medicare Other | Source: Ambulatory Visit | Attending: Cardiovascular Disease | Admitting: Cardiovascular Disease

## 2019-05-22 ENCOUNTER — Other Ambulatory Visit: Payer: Self-pay

## 2019-05-22 VITALS — Ht 63.0 in | Wt 150.6 lb

## 2019-05-22 DIAGNOSIS — Z951 Presence of aortocoronary bypass graft: Secondary | ICD-10-CM

## 2019-05-22 NOTE — Progress Notes (Signed)
Cardiac Individual Treatment Plan  Patient Details  Name: Quitman LivingsCarol A Rubis MRN: [id number] Date of Birth: 1941/04/18 Referring Provider:     CARDI[facility]  Referring Provider  Dr. Duke Salviaandolph      Initial Encounter Date:    CARD[facility]AC REH[facility]ASE [facility]  Date  05/22/19      Visit Diagnosis: No diagnosis found.  Patient's Home Medications on Admission:  Current Outpatient Medications:  .  ALPHAGAN P 0.1 % SOLN, Place 1 drop into both eyes every 12 (twelve) hours. , Disp: , Rfl:  .  ALPRAZolam (XANAX) 0.25 MG tablet, TAKE 1 TABLET BY MOUTH TWICE DAILY AS NEEDED FOR ANXIETY (Patient taking differently: Take 0.25 mg by mouth 2 (two) times daily as needed for anxiety. ), Disp: 30 tablet, Rfl: 0 .  aspirin 81 MG chewable tablet, Chew 81 mg by mouth daily., Disp: , Rfl:  .  aspirin EC 325 MG EC tablet, Take 1 tablet (325 mg total) by mouth daily., Disp: 30 tablet, Rfl: 0 .  atorvastatin (LIPITOR) 80 MG tablet, Take 80 mg by mouth daily., Disp: , Rfl:  .  Calcium Carb-Cholecalciferol (CALCIUM 600 + D PO), Take 1 tablet by mouth daily. , Disp: , Rfl:  .  carvedilol (COREG) 6.25 MG tablet, Take 1 tablet (6.25 mg total) by mouth 2 (two) times daily with a meal., Disp: 60 tablet, Rfl: 1 .  dorzolamide-timolol (COSOPT) 22.3-6.8 MG/ML ophthalmic solution, Place 1 drop into both eyes 2 (two) times daily. , Disp: , Rfl:  .  ELMIRON 100 MG capsule, Take 100 mg by mouth 3 (three) times daily., Disp: , Rfl:  .  escitalopram (LEXAPRO) 10 MG tablet, Take 10 mg by mouth daily., Disp: , Rfl:  .  esomeprazole (NEXIUM) 40 MG capsule, Take 1 capsule (40 mg total) by mouth daily before breakfast., Disp: 90 capsule, Rfl: 3 .  Fluocinolone Acetonide Scalp 0.01 % OIL, , Disp: , Rfl:  .  hydrochlorothiazide (MICROZIDE) 12.5 MG capsule, Take 1 capsule (12.5 mg total) by  mouth daily., Disp: 90 capsule, Rfl: 3 .  levothyroxine (SYNTHROID, LEVOTHROID) 25 MCG tablet, Take 1 tablet (25 mcg total) by mouth daily at 6 (six) AM., Disp: 30 tablet, Rfl: 1 .  loratadine (CLARITIN) 10 MG tablet, Take 10 mg by mouth daily., Disp: , Rfl:  .  Meth-Hyo-M Bl-Na Phos-Ph Sal (URIBEL) 118 MG CAPS, Take 1 capsule by mouth 2 (two) times daily as needed (for pain). , Disp: , Rfl:  .  Multiple Vitamins-Minerals (MULTIVITAMIN WITH MINERALS) tablet, Take 1 tablet by mouth daily., Disp: , Rfl:  .  naproxen (NAPROSYN) 250 MG tablet, Take 250 mg by mouth as needed., Disp: , Rfl:  .  Netarsudil-Latanoprost 0.02-0.005 % SOLN, Place 1 drop into both eyes at bedtime., Disp: , Rfl:  .  nitrofurantoin (MACRODANTIN) 100 MG capsule, Take 1 capsule by mouth at bedtime., Disp: , Rfl:  .  triamcinolone cream (KENALOG) 0.1 %, , Disp: , Rfl:  .  vitamin B-12 (CYANOCOBALAMIN) 100 MCG tablet, Take 100 mcg by mouth daily., Disp: , Rfl:   Past Medical History: Past Medical History:  Diagnosis Date  . Anxiety   . Arthritis   . Depression   . Diverticulosis   . GERD (gastroesophageal reflux disease)   . Glaucoma    Bilateral eyes  . Hyperlipidemia   . Hypertension   .  Peripheral neuropathy   . Pneumonia    hx of  . Urgency of urination     Tobacco Use: Social History   Tobacco Use  Smoking Status Never Smoker  Smokeless Tobacco Never Used    Labs: Recent Review Flowsheet Data    Labs for ITP Cardiac and Pulmonary Rehab Latest Ref Rng & Units 12/20/2018 12/21/2018 12/22/2018 12/23/2018 12/24/2018   Cholestrol 0 - 200 mg/dL - - - - -   LDLCALC 0 - 99 mg/dL - - - - -   LDLDIRECT mg/dL - - - - -   HDL >09 mg/dL - - - - -   Trlycerides <150 mg/dL - - - - -   Hemoglobin A1c 4.8 - 5.6 % - - - - -   PHART 7.350 - 7.450 7.389 - - - -   PCO2ART 32.0 - 48.0 mmHg 37.5 - - - -   HCO3 20.0 - 28.0 mmol/L 22.6 - - - -   TCO2 22 - 32 mmol/L 24 - - - -   ACIDBASEDEF 0.0 - 2.0 mmol/L 2.0 - - - -    O2SAT % 97.0 62.9 60.4 55.7 58.4      Capillary Blood Glucose: Lab Results  Component Value Date   GLUCAP 82 12/21/2018   GLUCAP 73 12/21/2018   GLUCAP 67 (L) 12/21/2018   GLUCAP 106 (H) 12/20/2018   GLUCAP 136 (H) 12/20/2018     Exercise Target Goals: Exercise Program Goal: Individual exercise prescription set using results from initial 6 min walk test and THRR while considering  patient's activity barriers and safety.   Exercise Prescription Goal: Initial exercise prescription builds to 30-45 minutes a day of aerobic activity, 2-3 days per week.  Home exercise guidelines will be given to patient during program as part of exercise prescription that the participant will acknowledge.  Activity Barriers & Risk Stratification: Activity Barriers & Cardiac Risk Stratification - 05/22/19 1056      Activity Barriers & Cardiac Risk Stratification   Activity Barriers  Assistive Device;Other (comment);Deconditioning;Muscular Weakness    Comments  Lower extremity neuropathy    Cardiac Risk Stratification  High       6 Minute Walk: 6 Minute Walk    Row Name 05/22/19 1055         6 Minute Walk   Phase  Initial     Distance  1117 feet     Walk Time  6 minutes     # of Rest Breaks  0     MPH  2.1     METS  1.88     RPE  11     Perceived Dyspnea   0     VO2 Peak  6.59     Symptoms  No     Resting HR  83 bpm     Resting BP  130/84     Resting Oxygen Saturation   98 %     Exercise Oxygen Saturation  during 6 min walk  96 %     Max Ex. HR  110 bpm     Max Ex. BP  124/80     2 Minute Post BP  124/62        Oxygen Initial Assessment:   Oxygen Re-Evaluation:   Oxygen Discharge (Final Oxygen Re-Evaluation):   Initial Exercise Prescription: Initial Exercise Prescription - 05/22/19 1100      Date of Initial Exercise RX and Referring Provider   Date  05/22/19  Referring Provider  Dr. Oval Linsey    Expected Discharge Date  07/04/19      NuStep   Level  2    SPM  75     Minutes  30    METs  2      Prescription Details   Frequency (times per week)  3    Duration  Progress to 30 minutes of continuous aerobic without signs/symptoms of physical distress      Intensity   THRR 40-80% of Max Heartrate  56-113    Ratings of Perceived Exertion  11-13      Progression   Progression  Continue to progress workloads to maintain intensity without signs/symptoms of physical distress.      Resistance Training   Training Prescription  Yes    Weight  2 lbs.     Reps  10-15       Perform Capillary Blood Glucose checks as needed.  Exercise Prescription Changes:   Exercise Comments:   Exercise Goals and Review: Exercise Goals    Row Name 05/22/19 1057             Exercise Goals   Increase Physical Activity  Yes       Intervention  Provide advice, education, support and counseling about physical activity/exercise needs.;Develop an individualized exercise prescription for aerobic and resistive training based on initial evaluation findings, risk stratification, comorbidities and participant's personal goals.       Expected Outcomes  Short Term: Attend rehab on a regular basis to increase amount of physical activity.;Long Term: Add in home exercise to make exercise part of routine and to increase amount of physical activity.;Long Term: Exercising regularly at least 3-5 days a week.       Increase Strength and Stamina  Yes       Intervention  Provide advice, education, support and counseling about physical activity/exercise needs.;Develop an individualized exercise prescription for aerobic and resistive training based on initial evaluation findings, risk stratification, comorbidities and participant's personal goals.       Expected Outcomes  Short Term: Increase workloads from initial exercise prescription for resistance, speed, and METs.;Short Term: Perform resistance training exercises routinely during rehab and add in resistance training at home;Long Term:  Improve cardiorespiratory fitness, muscular endurance and strength as measured by increased METs and functional capacity (6MWT)       Able to understand and use rate of perceived exertion (RPE) scale  Yes       Intervention  Provide education and explanation on how to use RPE scale       Expected Outcomes  Short Term: Able to use RPE daily in rehab to express subjective intensity level;Long Term:  Able to use RPE to guide intensity level when exercising independently       Knowledge and understanding of Target Heart Rate Range (THRR)  Yes       Intervention  Provide education and explanation of THRR including how the numbers were predicted and where they are located for reference       Expected Outcomes  Short Term: Able to state/look up THRR;Long Term: Able to use THRR to govern intensity when exercising independently;Short Term: Able to use daily as guideline for intensity in rehab       Able to check pulse independently  Yes       Intervention  Provide education and demonstration on how to check pulse in carotid and radial arteries.;Review the importance of being able to check your own pulse  for safety during independent exercise       Expected Outcomes  Short Term: Able to explain why pulse checking is important during independent exercise;Long Term: Able to check pulse independently and accurately       Understanding of Exercise Prescription  Yes       Intervention  Provide education, explanation, and written materials on patient's individual exercise prescription       Expected Outcomes  Short Term: Able to explain program exercise prescription;Long Term: Able to explain home exercise prescription to exercise independently          Exercise Goals Re-Evaluation :   Discharge Exercise Prescription (Final Exercise Prescription Changes):   Nutrition:  Target Goals: Understanding of nutrition guidelines, daily intake of sodium 1500mg , cholesterol 200mg , calories 30% from fat and 7% or less  from saturated fats, daily to have 5 or more servings of fruits and vegetables.  Biometrics: Pre Biometrics - 05/22/19 1058      Pre Biometrics   Height  5\' 3"  (1.6 m)    Weight  68.3 kg    Waist Circumference  35 inches    Hip Circumference  39 inches    Waist to Hip Ratio  0.9 %    BMI (Calculated)  26.68    Triceps Skinfold  29 mm    % Body Fat  39.6 %    Grip Strength  26 kg    Flexibility  8 in    Single Leg Stand  3.56 seconds        Nutrition Therapy Plan and Nutrition Goals:   Nutrition Assessments:   Nutrition Goals Re-Evaluation:   Nutrition Goals Re-Evaluation:   Nutrition Goals Discharge (Final Nutrition Goals Re-Evaluation):   Psychosocial: Target Goals: Acknowledge presence or absence of significant depression and/or stress, maximize coping skills, provide positive support system. Participant is able to verbalize types and ability to use techniques and skills needed for reducing stress and depression.  Initial Review & Psychosocial Screening: Initial Psych Review & Screening - 05/15/19 1104      Initial Review   Current issues with  None Identified      Family Dynamics   Good Support System?  Yes      Barriers   Psychosocial barriers to participate in program  There are no identifiable barriers or psychosocial needs.      Screening Interventions   Interventions  Encouraged to exercise       Quality of Life Scores: Quality of Life - 05/15/19 1103      Quality of Life   Select  Quality of Life      Quality of Life Scores   Health/Function Pre  26.89 %    Socioeconomic Pre  30 %    Psych/Spiritual Pre  27.43 %    Family Pre  30 %    GLOBAL Pre  28.02 %      Scores of 19 and below usually indicate a poorer quality of life in these areas.  A difference of  2-3 points is a clinically meaningful difference.  A difference of 2-3 points in the total score of the Quality of Life Index has been associated with significant improvement in overall  quality of life, self-image, physical symptoms, and general health in studies assessing change in quality of life.  PHQ-9: Recent Review Flowsheet Data    Depression screen [facility] 01/08/2019 11/17/2015 07/09/2014 05/22/2014 11/26/2013   Decreased Interest 0 0 0 0 0   Down, Depressed, Hopeless 0  0 0 0 0   PHQ - 2 Score 0 0 0 0 0     Interpretation of Total Score  Total Score Depression Severity:  1-4 = Minimal depression, 5-9 = Mild depression, 10-14 = Moderate depression, 15-19 = Moderately severe depression, 20-27 = Severe depression   Psychosocial Evaluation and Intervention:   Psychosocial Re-Evaluation:   Psychosocial Discharge (Final Psychosocial Re-Evaluation):   Vocational Rehabilitation: Provide vocational rehab assistance to qualifying candidates.   Vocational Rehab Evaluation & Intervention: Vocational Rehab - 05/22/19 1126      Initial Vocational Rehab Evaluation & Intervention   Assessment shows need for Vocational Rehabilitation  No       Education: Education Goals: Education classes will be provided on a weekly basis, covering required topics. Participant will state understanding/return demonstration of topics presented.  Learning Barriers/Preferences:   Education Topics: Count Your Pulse:  -Group instruction provided by verbal instruction, demonstration, patient participation and written materials to support subject.  Instructors address importance of being able to find your pulse and how to count your pulse when at home without a heart monitor.  Patients get hands on experience counting their pulse with staff help and individually.   Heart Attack, Angina, and Risk Factor Modification:  -Group instruction provided by verbal instruction, video, and written materials to support subject.  Instructors address signs and symptoms of angina and heart attacks.    Also discuss risk factors for heart disease and how to make changes to improve heart health risk  factors.   Functional Fitness:  -Group instruction provided by verbal instruction, demonstration, patient participation, and written materials to support subject.  Instructors address safety measures for doing things around the house.  Discuss how to get up and down off the floor, how to pick things up properly, how to safely get out of a chair without assistance, and balance training.   Meditation and Mindfulness:  -Group instruction provided by verbal instruction, patient participation, and written materials to support subject.  Instructor addresses importance of mindfulness and meditation practice to help reduce stress and improve awareness.  Instructor also leads participants through a meditation exercise.    Stretching for Flexibility and Mobility:  -Group instruction provided by verbal instruction, patient participation, and written materials to support subject.  Instructors lead participants through series of stretches that are designed to increase flexibility thus improving mobility.  These stretches are additional exercise for major muscle groups that are typically performed during regular warm up and cool down.   Hands Only CPR:  -Group verbal, video, and participation provides a basic overview of AHA guidelines for community CPR. Role-play of emergencies allow participants the opportunity to practice calling for help and chest compression technique with discussion of AED use.   Hypertension: -Group verbal and written instruction that provides a basic overview of hypertension including the most recent diagnostic guidelines, risk factor reduction with self-care instructions and medication management.    Nutrition I class: Heart Healthy Eating:  -Group instruction provided by PowerPoint slides, verbal discussion, and written materials to support subject matter. The instructor gives an explanation and review of the Therapeutic Lifestyle Changes diet recommendations, which includes a  discussion on lipid goals, dietary fat, sodium, fiber, plant stanol/sterol esters, sugar, and the components of a well-balanced, healthy diet.   Nutrition II class: Lifestyle Skills:  -Group instruction provided by PowerPoint slides, verbal discussion, and written materials to support subject matter. The instructor gives an explanation and review of label reading, grocery shopping for heart health,  heart healthy recipe modifications, and ways to make healthier choices when eating out.   Diabetes Question & Answer:  -Group instruction provided by PowerPoint slides, verbal discussion, and written materials to support subject matter. The instructor gives an explanation and review of diabetes co-morbidities, pre- and post-prandial blood glucose goals, pre-exercise blood glucose goals, signs, symptoms, and treatment of hypoglycemia and hyperglycemia, and foot care basics.   Diabetes Blitz:  -Group instruction provided by PowerPoint slides, verbal discussion, and written materials to support subject matter. The instructor gives an explanation and review of the physiology behind type 1 and type 2 diabetes, diabetes medications and rational behind using different medications, pre- and post-prandial blood glucose recommendations and Hemoglobin A1c goals, diabetes diet, and exercise including blood glucose guidelines for exercising safely.    Portion Distortion:  -Group instruction provided by PowerPoint slides, verbal discussion, written materials, and food models to support subject matter. The instructor gives an explanation of serving size versus portion size, changes in portions sizes over the last 20 years, and what consists of a serving from each food group.   Stress Management:  -Group instruction provided by verbal instruction, video, and written materials to support subject matter.  Instructors review role of stress in heart disease and how to cope with stress positively.     Exercising on Your  Own:  -Group instruction provided by verbal instruction, power point, and written materials to support subject.  Instructors discuss benefits of exercise, components of exercise, frequency and intensity of exercise, and end points for exercise.  Also discuss use of nitroglycerin and activating EMS.  Review options of places to exercise outside of rehab.  Review guidelines for sex with heart disease.   Cardiac Drugs I:  -Group instruction provided by verbal instruction and written materials to support subject.  Instructor reviews cardiac drug classes: antiplatelets, anticoagulants, beta blockers, and statins.  Instructor discusses reasons, side effects, and lifestyle considerations for each drug class.   Cardiac Drugs II:  -Group instruction provided by verbal instruction and written materials to support subject.  Instructor reviews cardiac drug classes: angiotensin converting enzyme inhibitors (ACE-I), angiotensin II receptor blockers (ARBs), nitrates, and calcium channel blockers.  Instructor discusses reasons, side effects, and lifestyle considerations for each drug class.   Anatomy and Physiology of the Circulatory System:  Group verbal and written instruction and models provide basic cardiac anatomy and physiology, with the coronary electrical and arterial systems. Review of: AMI, Angina, Valve disease, Heart Failure, Peripheral Artery Disease, Cardiac Arrhythmia, Pacemakers, and the ICD.   Other Education:  -Group or individual verbal, written, or video instructions that support the educational goals of the cardiac rehab program.   Holiday Eating Survival Tips:  -Group instruction provided by PowerPoint slides, verbal discussion, and written materials to support subject matter. The instructor gives patients tips, tricks, and techniques to help them not only survive but enjoy the holidays despite the onslaught of food that accompanies the holidays.   Knowledge Questionnaire  Score: Knowledge Questionnaire Score - 05/15/19 1103      Knowledge Questionnaire Score   Pre Score  22/24       Core Components/Risk Factors/Patient Goals at Admission: Personal Goals and Risk Factors at Admission - 05/22/19 1101      Core Components/Risk Factors/Patient Goals on Admission    Weight Management  Yes;Weight Maintenance;Weight Loss;Obesity    Admit Weight  150 lb 9.2 oz (68.3 kg)    Hypertension  Yes    Intervention  Provide education on lifestyle  modifcations including regular physical activity/exercise, weight management, moderate sodium restriction and increased consumption of fresh fruit, vegetables, and low fat dairy, alcohol moderation, and smoking cessation.;Monitor prescription use compliance.    Expected Outcomes  Short Term: Continued assessment and intervention until BP is < 140/3490mm HG in hypertensive participants. < 130/6580mm HG in hypertensive participants with diabetes, heart failure or chronic kidney disease.;Long Term: Maintenance of blood pressure at goal levels.    Lipids  Yes    Intervention  Provide education and support for participant on nutrition & aerobic/resistive exercise along with prescribed medications to achieve LDL 70mg , HDL >40mg .    Expected Outcomes  Short Term: Participant states understanding of desired cholesterol values and is compliant with medications prescribed. Participant is following exercise prescription and nutrition guidelines.;Long Term: Cholesterol controlled with medications as prescribed, with individualized exercise RX and with personalized nutrition plan. Value goals: LDL < 70mg , HDL > 40 mg.       Core Components/Risk Factors/Patient Goals Review:    Core Components/Risk Factors/Patient Goals at Discharge (Final Review):    ITP Comments: ITP Comments    Row Name 05/15/19 1021 05/22/19 1124         ITP Comments  Dr. Armanda Magicraci  Turner, Medical Director  Dr. Armanda Magicraci Turner, Medical Director         Comments: Patient  attended orientation on 05/22/2019 to review rules and guidelines for program.  Completed 6 minute walk test, Intitial ITP, and exercise prescription.  VSS. Telemetry-SR.  Asymptomatic. Safety measures and social distancing in place per CDC guidelines.

## 2019-05-23 ENCOUNTER — Encounter (HOSPITAL_COMMUNITY): Payer: Medicare Other

## 2019-05-26 ENCOUNTER — Other Ambulatory Visit: Payer: Self-pay

## 2019-05-26 ENCOUNTER — Encounter (HOSPITAL_COMMUNITY)
Admission: RE | Admit: 2019-05-26 | Discharge: 2019-05-26 | Disposition: A | Discharge status: Home / Self Care | Payer: Medicare Other | Source: Ambulatory Visit | Attending: Cardiovascular Disease | Admitting: Cardiovascular Disease

## 2019-05-26 ENCOUNTER — Encounter (HOSPITAL_COMMUNITY): Payer: Medicare Other

## 2019-05-26 DIAGNOSIS — Z951 Presence of aortocoronary bypass graft: Secondary | ICD-10-CM | POA: Diagnosis not present

## 2019-05-26 NOTE — Progress Notes (Signed)
Daily Session Note  Patient Details  Name: Melissa Gonzales MRN: [id number] Date of Birth: 02/02/1941 Referring Provider:     CARDIAC REHAB PHASE II ORIENTATION from 05/22/2019 in [place]  Referring Provider  Dr. Oval Linsey      Encounter Date: 05/26/2019  Check In: Session Check In - 05/26/19 1325      Check-In   Supervising physician immediately available to respond to emergencies  [facility] immediately available    Physician(s)  Dr. Earnest Conroy    Location  [facility]    Staff Present  Deitra Mayo, BS, ACSM CEP, Exercise Physiologist;Maria Whitaker, RN, Toma Deiters, RN, Mosie Epstein, MS,ACSM CEP, Exercise Physiologist    Virtual Visit  No    Medication changes reported      No    Fall or balance concerns reported     No    Tobacco Cessation  No Change    Warm-up and Cool-down  Performed on first and last piece of equipment    Resistance Training Performed  Yes    VAD Patient?  No    PAD/SET Patient?  No      Pain Assessment   Currently in Pain?  No/denies    Multiple Pain Sites  No       Capillary Blood Glucose: No results found for this or any previous visit (from the past 24 hour(s)).  Exercise Prescription Changes - 05/26/19 1600      Response to Exercise   Blood Pressure (Admit)  136/70    Blood Pressure (Exercise)  152/78    Blood Pressure (Exit)  118/70    Heart Rate (Admit)  95 bpm    Heart Rate (Exercise)  102 bpm    Heart Rate (Exit)  89 bpm    Rating of Perceived Exertion (Exercise)  13    Symptoms  None    Comments  Pt first day of exercise.     Duration  Continue with 30 min of aerobic exercise without signs/symptoms of physical distress.    Intensity  THRR unchanged      Resistance Training   Training Prescription  Yes    Weight  2 lbs.     Reps  10-15    Time  10 Minutes      NuStep   Level  2    SPM  75    Minutes  30    METs  1.9       Social History   Tobacco Use   Smoking Status Never Smoker  Smokeless Tobacco Never Used    Goals Met:  No report of cardiac concerns or symptoms  Goals Unmet:  Not Applicable  Comments: Pt started cardiac rehab today.  Pt tolerated light exercise without difficulty. VSS, telemetry-Sinus Rhythm, asymptomatic.  Medication list reconciled. Pt denies barriers to medicaiton compliance.  PSYCHOSOCIAL ASSESSMENT:  PHQ-0. Pt exhibits positive coping skills, hopeful outlook with supportive family. No psychosocial needs identified at this time, no psychosocial interventions necessary.    Pt enjoys talking on the phone and spending socially distant times with her friends.   Pt oriented to exercise equipment and routine.  Malaijah used a rolling walker for stability and a chair in front of her for stability during cool down.  Understanding verbalized.Barnet Pall, RN,BSN 05/26/2019 4:41 PM   Dr. Fransico Him is Medical Director for Cardiac Rehab at [facility].

## 2019-05-28 ENCOUNTER — Encounter (HOSPITAL_COMMUNITY): Payer: Medicare Other

## 2019-05-28 ENCOUNTER — Other Ambulatory Visit: Payer: Self-pay

## 2019-05-28 ENCOUNTER — Encounter (HOSPITAL_COMMUNITY)
Admission: RE | Admit: 2019-05-28 | Discharge: 2019-05-28 | Disposition: A | Discharge status: Home / Self Care | Payer: Medicare Other | Source: Ambulatory Visit | Attending: Cardiovascular Disease | Admitting: Cardiovascular Disease

## 2019-05-28 DIAGNOSIS — Z951 Presence of aortocoronary bypass graft: Secondary | ICD-10-CM

## 2019-05-28 NOTE — Progress Notes (Signed)
Incomplete Session Note  Patient Details  Name: Melissa Gonzales MRN: [id number] Date of Birth: May 15, 1941 Referring Provider:     CARDIAC REHAB PHASE II ORIENTATION from 05/22/2019 in [place]  Referring Provider  [name] did not complete her rehab session.  Melissa Gonzales slipped and fell yesterday at the pool while participating in water aerobics. Melissa Gonzales was noted to have a bruise on her right shoulder a scrape on her right shoulder and a scrape on her right forehand. Melissa Gonzales denies having pain or discomfort unless she touches her shoulder.  [name]'s office called and notified. [name] said that Melissa Gonzales can return to exercise and does not need to be evaluated in the office. Melissa Gonzales plans to return to exercise of Friday.Barnet Pall, RN,BSN 05/28/2019 3:09 PM

## 2019-05-29 NOTE — Progress Notes (Signed)
Cardiac Individual Treatment Plan  Patient Details  Name: Melissa Gonzales A Finchum MRN: [id number] Date of Birth: 06-Jan-1941 Referring Provider:     CARDIAC REHAB PHASE II [facility]ATION [facility]  Referring Provider  Dr. 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  ALPHAGAN P 0.1 % SOLN, Place 1 drop into both eyes every 12 (twelve) hours. , Disp: , Rfl:  .  ALPRAZolam (XANAX) 0.25 MG tablet, TAKE 1 TABLET BY MOUTH TWICE DAILY AS NEEDED FOR ANXIETY (Patient taking differently: Take 0.25 mg by mouth 2 (two) times daily as needed for anxiety. ), Disp: 30 tablet, Rfl: 0 .  aspirin 81 MG chewable tablet, Chew 81 mg by mouth daily., Disp: , Rfl:  .  aspirin EC 325 MG EC tablet, Take 1 tablet (325 mg total) by mouth daily. (Patient not taking: Reported on 05/26/2019), Disp: 30 tablet, Rfl: 0 .  atorvastatin (LIPITOR) 80 MG tablet, Take 80 mg by mouth daily., Disp: , Rfl:  .  Calcium Carb-Cholecalciferol (CALCIUM 600 + D PO), Take 1 tablet by mouth daily. , Disp: , Rfl:  .  carvedilol (COREG) 6.25 MG tablet, Take 1 tablet (6.25 mg total) by mouth 2 (two) times daily with a meal., Disp: 60 tablet, Rfl: 1 .  dorzolamide-timolol (COSOPT) 22.3-6.8 MG/ML ophthalmic solution, Place 1 drop into both eyes 2 (two) times daily. , Disp: , Rfl:  .  ELMIRON 100 MG capsule, Take 100 mg by mouth 3 (three) times daily., Disp: , Rfl:  .  escitalopram (LEXAPRO) 10 MG tablet, Take 10 mg by mouth daily., Disp: , Rfl:  .  esomeprazole (NEXIUM) 40 MG capsule, Take 1 capsule (40 mg total) by mouth daily before breakfast., Disp: 90 capsule, Rfl: 3 .  Fluocinolone Acetonide Scalp 0.01 % OIL, , Disp: , Rfl:  .  hydrochlorothiazide (MICROZIDE) 12.5 MG  capsule, Take 1 capsule (12.5 mg total) by mouth daily., Disp: 90 capsule, Rfl: 3 .  levothyroxine (SYNTHROID, LEVOTHROID) 25 MCG tablet, Take 1 tablet (25 mcg total) by mouth daily at 6 (six) AM., Disp: 30 tablet, Rfl: 1 .  loratadine (CLARITIN) 10 MG tablet, Take 10 mg by mouth daily., Disp: , Rfl:  .  Meth-Hyo-M Bl-Na Phos-Ph Sal (URIBEL) 118 MG CAPS, Take 1 capsule by mouth 2 (two) times daily as needed (for pain). , Disp: , Rfl:  .  Multiple Vitamins-Minerals (MULTIVITAMIN WITH MINERALS) tablet, Take 1 tablet by mouth daily., Disp: , Rfl:  .  naproxen (NAPROSYN) 250 MG tablet, Take 250 mg by mouth as needed., Disp: , Rfl:  .  Netarsudil-Latanoprost 0.02-0.005 % SOLN, Place 1 drop into both eyes at bedtime., Disp: , Rfl:  .  nitrofurantoin (MACRODANTIN) 100 MG capsule, Take 1 capsule by mouth at bedtime., Disp: , Rfl:  .  triamcinolone cream (KENALOG) 0.1 %, , Disp: , Rfl:  .  vitamin B-12 (CYANOCOBALAMIN) 100 MCG tablet, Take 100 mcg by mouth daily., Disp: , Rfl:   Past Medical History: Past Medical History:  Diagnosis Date  . Anxiety   . Arthritis   . Depression   . Diverticulosis   . GERD (gastroesophageal reflux disease)   . Glaucoma    Bilateral eyes  .  Hyperlipidemia   . Hypertension   . Peripheral neuropathy   . Pneumonia    hx of  . Urgency of urination     Tobacco Use: Social History   Tobacco Use  Smoking Status Never Smoker  Smokeless Tobacco Never Used    Labs: Recent Review Flowsheet Data    Labs for ITP Cardiac and Pulmonary Rehab Latest Ref Rng & Units 12/20/2018 12/21/2018 12/22/2018 12/23/2018 12/24/2018   Cholestrol 0 - 200 mg/dL - - - - -   LDLCALC 0 - 99 mg/dL - - - - -   LDLDIRECT mg/dL - - - - -   HDL >40 mg/dL - - - - -   Trlycerides <150 mg/dL - - - - -   Hemoglobin A1c 4.8 - 5.6 % - - - - -   PHART 7.350 - 7.450 7.389 - - - -   PCO2ART 32.0 - 48.0 mmHg 37.5 - - - -   HCO3 20.0 - 28.0 mmol/L 22.6 - - - -   TCO2 22 - 32 mmol/L 24 - - - -    ACIDBASEDEF 0.0 - 2.0 mmol/L 2.0 - - - -   O2SAT % 97.0 62.9 60.4 55.7 58.4      Capillary Blood Glucose: Lab Results  Component Value Date   GLUCAP 82 12/21/2018   GLUCAP 73 12/21/2018   GLUCAP 67 (L) 12/21/2018   GLUCAP 106 (H) 12/20/2018   GLUCAP 136 (H) 12/20/2018     Exercise Target Goals: Exercise Program Goal: Individual exercise prescription set using results from initial 6 min walk test and THRR while considering  patient's activity barriers and safety.   Exercise Prescription Goal: Starting with aerobic activity 30 plus minutes a day, 3 days per week for initial exercise prescription. Provide home exercise prescription and guidelines that participant acknowledges understanding prior to discharge.  Activity Barriers & Risk Stratification: Activity Barriers & Cardiac Risk Stratification - 05/22/19 1056      Activity Barriers & Cardiac Risk Stratification   Activity Barriers  Assistive Device;Other (comment);Deconditioning;Muscular Weakness    Comments  Lower extremity neuropathy    Cardiac Risk Stratification  High       6 Minute Walk: 6 Minute Walk    Row Name 05/22/19 1055         6 Minute Walk   Phase  Initial     Distance  1117 feet     Walk Time  6 minutes     # of Rest Breaks  0     MPH  2.1     METS  1.88     RPE  11     Perceived Dyspnea   0     VO2 Peak  6.59     Symptoms  No     Resting HR  83 bpm     Resting BP  130/84     Resting Oxygen Saturation   98 %     Exercise Oxygen Saturation  during 6 min walk  96 %     Max Ex. HR  110 bpm     Max Ex. BP  124/80     2 Minute Post BP  124/62        Oxygen Initial Assessment:   Oxygen Re-Evaluation:   Oxygen Discharge (Final Oxygen Re-Evaluation):   Initial Exercise Prescription: Initial Exercise Prescription - 05/22/19 1100      Date of Initial Exercise RX and Referring Provider   Date  05/22/19  Referring Provider  Dr. Duke Salviaandolph    Expected Discharge Date  07/04/19      NuStep    Level  2    SPM  75    Minutes  30    METs  2      Prescription Details   Frequency (times per week)  3    Duration  Progress to 30 minutes of continuous aerobic without signs/symptoms of physical distress      Intensity   THRR 40-80% of Max Heartrate  56-113    Ratings of Perceived Exertion  11-13      Progression   Progression  Continue to progress workloads to maintain intensity without signs/symptoms of physical distress.      Resistance Training   Training Prescription  Yes    Weight  2 lbs.     Reps  10-15       Perform Capillary Blood Glucose checks as needed.  Exercise Prescription Changes: Exercise Prescription Changes    Row Name 05/26/19 1600             Response to Exercise   Blood Pressure (Admit)  136/70       Blood Pressure (Exercise)  152/78       Blood Pressure (Exit)  118/70       Heart Rate (Admit)  95 bpm       Heart Rate (Exercise)  102 bpm       Heart Rate (Exit)  89 bpm       Rating of Perceived Exertion (Exercise)  13       Symptoms  None       Comments  Pt first day of exercise.        Duration  Continue with 30 min of aerobic exercise without signs/symptoms of physical distress.       Intensity  THRR unchanged         Resistance Training   Training Prescription  Yes       Weight  2 lbs.        Reps  10-15       Time  10 Minutes         NuStep   Level  2       SPM  75       Minutes  30       METs  1.9          Exercise Comments: Exercise Comments    Row Name 05/26/19 1629 05/28/19 1526         Exercise Comments  Pt first day of exercise. Pt tolerated exercise well.  Pt fell at home, no exercise today.         Exercise Goals and Review: Exercise Goals    Row Name 05/22/19 1057             Exercise Goals   Increase Physical Activity  Yes       Intervention  Provide advice, education, support and counseling about physical activity/exercise needs.;Develop an individualized exercise prescription for aerobic and  resistive training based on initial evaluation findings, risk stratification, comorbidities and participant's personal goals.       Expected Outcomes  Short Term: Attend rehab on a regular basis to increase amount of physical activity.;Long Term: Add in home exercise to make exercise part of routine and to increase amount of physical activity.;Long Term: Exercising regularly at least 3-5 days a week.       Increase Strength and Stamina  Yes       Intervention  Provide advice, education, support and counseling about physical activity/exercise needs.;Develop an individualized exercise prescription for aerobic and resistive training based on initial evaluation findings, risk stratification, comorbidities and participant's personal goals.       Expected Outcomes  Short Term: Increase workloads from initial exercise prescription for resistance, speed, and METs.;Short Term: Perform resistance training exercises routinely during rehab and add in resistance training at home;Long Term: Improve cardiorespiratory fitness, muscular endurance and strength as measured by increased METs and functional capacity (6MWT)       Able to understand and use rate of perceived exertion (RPE) scale  Yes       Intervention  Provide education and explanation on how to use RPE scale       Expected Outcomes  Short Term: Able to use RPE daily in rehab to express subjective intensity level;Long Term:  Able to use RPE to guide intensity level when exercising independently       Knowledge and understanding of Target Heart Rate Range (THRR)  Yes       Intervention  Provide education and explanation of THRR including how the numbers were predicted and where they are located for reference       Expected Outcomes  Short Term: Able to state/look up THRR;Long Term: Able to use THRR to govern intensity when exercising independently;Short Term: Able to use daily as guideline for intensity in rehab       Able to check pulse independently  Yes        Intervention  Provide education and demonstration on how to check pulse in carotid and radial arteries.;Review the importance of being able to check your own pulse for safety during independent exercise       Expected Outcomes  Short Term: Able to explain why pulse checking is important during independent exercise;Long Term: Able to check pulse independently and accurately       Understanding of Exercise Prescription  Yes       Intervention  Provide education, explanation, and written materials on patient's individual exercise prescription       Expected Outcomes  Short Term: Able to explain program exercise prescription;Long Term: Able to explain home exercise prescription to exercise independently          Exercise Goals Re-Evaluation : Exercise Goals Re-Evaluation    Row Name 05/26/19 1628 05/28/19 1525           Exercise Goal Re-Evaluation   Exercise Goals Review  Increase Physical Activity;Increase Strength and Stamina;Able to understand and use rate of perceived exertion (RPE) scale;Knowledge and understanding of Target Heart Rate Range (THRR);Understanding of Exercise Prescription  Increase Physical Activity;Increase Strength and Stamina;Able to understand and use rate of perceived exertion (RPE) scale;Knowledge and understanding of Target Heart Rate Range (THRR);Understanding of Exercise Prescription      Comments  Pt first day of exercise in CR program. Pt tolerated exercise well.  Patient fell at home. No exercise today, unable to review goals with patient.      Expected Outcomes  Will continue to monitor and progress Pt as tolerated.  Progress workloads as tolerated when patient is cleared to resume exercise.          Discharge Exercise Prescription (Final Exercise Prescription Changes): Exercise Prescription Changes - 05/26/19 1600      Response to Exercise   Blood Pressure (Admit)  136/70    Blood Pressure (Exercise)  152/78    Blood Pressure (Exit)  118/70    Heart Rate  (Admit)  95 bpm    Heart Rate (Exercise)  102 bpm    Heart Rate (Exit)  89 bpm    Rating of Perceived Exertion (Exercise)  13    Symptoms  None    Comments  Pt first day of exercise.     Duration  Continue with 30 min of aerobic exercise without signs/symptoms of physical distress.    Intensity  THRR unchanged      Resistance Training   Training Prescription  Yes    Weight  2 lbs.     Reps  10-15    Time  10 Minutes      NuStep   Level  2    SPM  75    Minutes  30    METs  1.9       Nutrition:  Target Goals: Understanding of nutrition guidelines, daily intake of sodium 1500mg , cholesterol 200mg , calories 30% from fat and 7% or less from saturated fats, daily to have 5 or more servings of fruits and vegetables.  Biometrics: Pre Biometrics - 05/22/19 1058      Pre Biometrics   Height  5\' 3"  (1.6 m)    Weight  68.3 kg    Waist Circumference  35 inches    Hip Circumference  39 inches    Waist to Hip Ratio  0.9 %    BMI (Calculated)  26.68    Triceps Skinfold  29 mm    % Body Fat  39.6 %    Grip Strength  26 kg    Flexibility  8 in    Single Leg Stand  3.56 seconds        Nutrition Therapy Plan and Nutrition Goals:   Nutrition Assessments:   Nutrition Goals Re-Evaluation:   Nutrition Goals Discharge (Final Nutrition Goals Re-Evaluation):   Psychosocial: Target Goals: Acknowledge presence or absence of significant depression and/or stress, maximize coping skills, provide positive support system. Participant is able to verbalize types and ability to use techniques and skills needed for reducing stress and depression.  Initial Review & Psychosocial Screening: Initial Psych Review & Screening - 05/15/19 1104      Initial Review   Current issues with  None Identified      Family Dynamics   Good Support System?  Yes      Barriers   Psychosocial barriers to participate in program  There are no identifiable barriers or psychosocial needs.      Screening  Interventions   Interventions  Encouraged to exercise       Quality of Life Scores: Quality of Life - 05/15/19 1103      Quality of Life   Select  Quality of Life      Quality of Life Scores   Health/Function Pre  26.89 %    Socioeconomic Pre  30 %    Psych/Spiritual Pre  27.43 %    Family Pre  30 %    GLOBAL Pre  28.02 %      Scores of 19 and below usually indicate a poorer quality of life in these areas.  A difference of  2-3 points is a clinically meaningful difference.  A difference of 2-3 points in the total score of the Quality of Life Index has been associated with significant improvement in overall quality of life, self-image, physical symptoms, and general health in studies assessing change in quality of life.  PHQ-9: Recent Review Flowsheet Data  Depression screen [facility] 2/9 05/26/2019 01/08/2019 11/17/2015 07/09/2014 05/22/2014   Decreased Interest 0 0 0 0 0   Down, Depressed, Hopeless 0 0 0 0 0   PHQ - 2 Score 0 0 0 0 0     Interpretation of Total Score  Total Score Depression Severity:  1-4 = Minimal depression, 5-9 = Mild depression, 10-14 = Moderate depression, 15-19 = Moderately severe depression, 20-27 = Severe depression   Psychosocial Evaluation and Intervention:   Psychosocial Re-Evaluation: Psychosocial Re-Evaluation    Row Name 05/29/19 1515             Psychosocial Re-Evaluation   Current issues with  None Identified       Interventions  Encouraged to attend Cardiac Rehabilitation for the exercise       Continue Psychosocial Services   No Follow up required          Psychosocial Discharge (Final Psychosocial Re-Evaluation): Psychosocial Re-Evaluation - 05/29/19 1515      Psychosocial Re-Evaluation   Current issues with  None Identified    Interventions  Encouraged to attend Cardiac Rehabilitation for the exercise    Continue Psychosocial Services   No Follow up required       Vocational Rehabilitation: Provide vocational rehab assistance to  qualifying candidates.   Vocational Rehab Evaluation & Intervention: Vocational Rehab - 05/22/19 1126      Initial Vocational Rehab Evaluation & Intervention   Assessment shows need for Vocational Rehabilitation  No       Education: Education Goals: Education classes will be provided on a weekly basis, covering required topics. Participant will state understanding/return demonstration of topics presented.  Learning Barriers/Preferences:   Education Topics: Hypertension, Hypertension Reduction -Define heart disease and high blood pressure. Discus how high blood pressure affects the body and ways to reduce high blood pressure.   Exercise and Your Heart -Discuss why it is important to exercise, the FITT principles of exercise, normal and abnormal responses to exercise, and how to exercise safely.   Angina -Discuss definition of angina, causes of angina, treatment of angina, and how to decrease risk of having angina.   Cardiac Medications -Review what the following cardiac medications are used for, how they affect the body, and side effects that may occur when taking the medications.  Medications include Aspirin, Beta blockers, calcium channel blockers, ACE Inhibitors, angiotensin receptor blockers, diuretics, digoxin, and antihyperlipidemics.   Congestive Heart Failure -Discuss the definition of CHF, how to live with CHF, the signs and symptoms of CHF, and how keep track of weight and sodium intake.   Heart Disease and Intimacy -Discus the effect sexual activity has on the heart, how changes occur during intimacy as we age, and safety during sexual activity.   Smoking Cessation / COPD -Discuss different methods to quit smoking, the health benefits of quitting smoking, and the definition of COPD.   Nutrition I: Fats -Discuss the types of cholesterol, what cholesterol does to the heart, and how cholesterol levels can be controlled.   Nutrition II: Labels -Discuss the  different components of food labels and how to read food label   Heart Parts/Heart Disease and PAD -Discuss the anatomy of the heart, the pathway of blood circulation through the heart, and these are affected by heart disease.   Stress I: Signs and Symptoms -Discuss the causes of stress, how stress may lead to anxiety and depression, and ways to limit stress.   Stress II: Relaxation -Discuss different types of relaxation techniques to limit  stress.   Warning Signs of Stroke / TIA -Discuss definition of a stroke, what the signs and symptoms are of a stroke, and how to identify when someone is having stroke.   Knowledge Questionnaire Score: Knowledge Questionnaire Score - 05/15/19 1103      Knowledge Questionnaire Score   Pre Score  22/24       Core Components/Risk Factors/Patient Goals at Admission: Personal Goals and Risk Factors at Admission - 05/22/19 1101      Core Components/Risk Factors/Patient Goals on Admission    Weight Management  Yes;Weight Maintenance;Weight Loss;Obesity    Admit Weight  150 lb 9.2 oz (68.3 kg)    Hypertension  Yes    Intervention  Provide education on lifestyle modifcations including regular physical activity/exercise, weight management, moderate sodium restriction and increased consumption of fresh fruit, vegetables, and low fat dairy, alcohol moderation, and smoking cessation.;Monitor prescription use compliance.    Expected Outcomes  Short Term: Continued assessment and intervention until BP is < 140/68mm HG in hypertensive participants. < 130/4mm HG in hypertensive participants with diabetes, heart failure or chronic kidney disease.;Long Term: Maintenance of blood pressure at goal levels.    Lipids  Yes    Intervention  Provide education and support for participant on nutrition & aerobic/resistive exercise along with prescribed medications to achieve LDL 70mg , HDL >40mg .    Expected Outcomes  Short Term: Participant states understanding of  desired cholesterol values and is compliant with medications prescribed. Participant is following exercise prescription and nutrition guidelines.;Long Term: Cholesterol controlled with medications as prescribed, with individualized exercise RX and with personalized nutrition plan. Value goals: LDL < , HDL > 40 mg.       Core Components/Risk Factors/Patient Goals Review:  Goals and Risk Factor Review    Row Name 05/29/19 1516             Core Components/Risk Factors/Patient Goals Review   Personal Goals Review  Weight Management/Obesity;Hypertension;Lipids       Review  Osmara's vital signs have been stable so far. Huberta uses her rolling walker and a chair while doing cool down stretches for stability.       Expected Outcomes  Patient will continue to particpate in cardiac rehab for exercise, lifestyle and nutrtion modifications          Core Components/Risk Factors/Patient Goals at Discharge (Final Review):  Goals and Risk Factor Review - 05/29/19 1516      Core Components/Risk Factors/Patient Goals Review   Personal Goals Review  Weight Management/Obesity;Hypertension;Lipids    Review  Lucee's vital signs have been stable so far. Sena uses her rolling walker and a chair while doing cool down stretches for stability.    Expected Outcomes  Patient will continue to particpate in cardiac rehab for exercise, lifestyle and nutrtion modifications       ITP Comments: ITP Comments    Row Name 05/15/19 1021 05/22/19 1124 05/29/19 1514       ITP Comments  Dr. Armanda Magic, Medical Director  Dr. Armanda Magic, Medical Director  30 Day ITP Review. Samika is off to a good start to exercise.        Comments: See ITP comments.Gladstone Lighter, RN,BSN 05/29/2019 3:24 PM

## 2019-05-30 ENCOUNTER — Encounter (HOSPITAL_COMMUNITY): Payer: Medicare Other

## 2019-05-30 ENCOUNTER — Telehealth (HOSPITAL_COMMUNITY): Payer: Self-pay | Admitting: *Deleted

## 2019-06-02 ENCOUNTER — Encounter (HOSPITAL_COMMUNITY): Payer: Medicare Other

## 2019-06-02 ENCOUNTER — Encounter (HOSPITAL_COMMUNITY)
Admission: RE | Admit: 2019-06-02 | Discharge: 2019-06-02 | Disposition: A | Discharge status: Home / Self Care | Payer: Medicare Other | Source: Ambulatory Visit | Attending: Cardiovascular Disease | Admitting: Cardiovascular Disease

## 2019-06-02 ENCOUNTER — Other Ambulatory Visit: Payer: Self-pay

## 2019-06-02 DIAGNOSIS — Z951 Presence of aortocoronary bypass graft: Secondary | ICD-10-CM

## 2019-06-04 ENCOUNTER — Other Ambulatory Visit: Payer: Self-pay

## 2019-06-04 ENCOUNTER — Encounter (HOSPITAL_COMMUNITY)
Admission: RE | Admit: 2019-06-04 | Discharge: 2019-06-04 | Disposition: A | Discharge status: Home / Self Care | Payer: Medicare Other | Source: Ambulatory Visit | Attending: Cardiovascular Disease | Admitting: Cardiovascular Disease

## 2019-06-04 ENCOUNTER — Encounter (HOSPITAL_COMMUNITY): Payer: Medicare Other

## 2019-06-04 DIAGNOSIS — Z951 Presence of aortocoronary bypass graft: Secondary | ICD-10-CM | POA: Diagnosis not present

## 2019-06-06 ENCOUNTER — Encounter (HOSPITAL_COMMUNITY): Payer: Medicare Other

## 2019-06-09 ENCOUNTER — Encounter (HOSPITAL_COMMUNITY): Payer: Medicare Other

## 2019-06-09 ENCOUNTER — Other Ambulatory Visit: Payer: Self-pay

## 2019-06-09 ENCOUNTER — Encounter (HOSPITAL_COMMUNITY)
Admission: RE | Admit: 2019-06-09 | Discharge: 2019-06-09 | Disposition: A | Discharge status: Home / Self Care | Payer: Medicare Other | Source: Ambulatory Visit | Attending: Cardiovascular Disease | Admitting: Cardiovascular Disease

## 2019-06-09 DIAGNOSIS — Z951 Presence of aortocoronary bypass graft: Secondary | ICD-10-CM | POA: Diagnosis not present

## 2019-06-11 ENCOUNTER — Encounter (HOSPITAL_COMMUNITY): Payer: Medicare Other

## 2019-06-13 ENCOUNTER — Encounter (HOSPITAL_COMMUNITY): Payer: Medicare Other

## 2019-06-16 ENCOUNTER — Encounter (HOSPITAL_COMMUNITY): Payer: Medicare Other

## 2019-06-18 ENCOUNTER — Encounter (HOSPITAL_COMMUNITY): Payer: Medicare Other

## 2019-06-20 ENCOUNTER — Encounter (HOSPITAL_COMMUNITY): Payer: Medicare Other

## 2019-06-23 ENCOUNTER — Encounter (HOSPITAL_COMMUNITY): Payer: Medicare Other

## 2019-06-25 ENCOUNTER — Encounter (HOSPITAL_COMMUNITY): Payer: Medicare Other

## 2019-06-26 ENCOUNTER — Telehealth (HOSPITAL_COMMUNITY): Payer: Self-pay | Admitting: *Deleted

## 2019-06-26 ENCOUNTER — Encounter (HOSPITAL_COMMUNITY): Payer: Self-pay | Admitting: *Deleted

## 2019-06-26 DIAGNOSIS — Z951 Presence of aortocoronary bypass graft: Secondary | ICD-10-CM

## 2019-06-26 NOTE — Progress Notes (Signed)
Cardiac Individual Treatment Plan  Patient Details  Name: Melissa Gonzales MRN: [id number] Date of Birth: 06-Jan-1941 Referring Provider:     CARDIAC REHAB PHASE II [facility]ATION [facility]  Referring Provider  Dr. 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  ALPHAGAN P 0.1 % SOLN, Place 1 drop into both eyes every 12 (twelve) hours. , Disp: , Rfl:  .  ALPRAZolam (XANAX) 0.25 MG tablet, TAKE 1 TABLET BY MOUTH TWICE DAILY AS NEEDED FOR ANXIETY (Patient taking differently: Take 0.25 mg by mouth 2 (two) times daily as needed for anxiety. ), Disp: 30 tablet, Rfl: 0 .  aspirin 81 MG chewable tablet, Chew 81 mg by mouth daily., Disp: , Rfl:  .  aspirin EC 325 MG EC tablet, Take 1 tablet (325 mg total) by mouth daily. (Patient not taking: Reported on 05/26/2019), Disp: 30 tablet, Rfl: 0 .  atorvastatin (LIPITOR) 80 MG tablet, Take 80 mg by mouth daily., Disp: , Rfl:  .  Calcium Carb-Cholecalciferol (CALCIUM 600 + D PO), Take 1 tablet by mouth daily. , Disp: , Rfl:  .  carvedilol (COREG) 6.25 MG tablet, Take 1 tablet (6.25 mg total) by mouth 2 (two) times daily with a meal., Disp: 60 tablet, Rfl: 1 .  dorzolamide-timolol (COSOPT) 22.3-6.8 MG/ML ophthalmic solution, Place 1 drop into both eyes 2 (two) times daily. , Disp: , Rfl:  .  ELMIRON 100 MG capsule, Take 100 mg by mouth 3 (three) times daily., Disp: , Rfl:  .  escitalopram (LEXAPRO) 10 MG tablet, Take 10 mg by mouth daily., Disp: , Rfl:  .  esomeprazole (NEXIUM) 40 MG capsule, Take 1 capsule (40 mg total) by mouth daily before breakfast., Disp: 90 capsule, Rfl: 3 .  Fluocinolone Acetonide Scalp 0.01 % OIL, , Disp: , Rfl:  .  hydrochlorothiazide (MICROZIDE) 12.5 MG  capsule, Take 1 capsule (12.5 mg total) by mouth daily., Disp: 90 capsule, Rfl: 3 .  levothyroxine (SYNTHROID, LEVOTHROID) 25 MCG tablet, Take 1 tablet (25 mcg total) by mouth daily at 6 (six) AM., Disp: 30 tablet, Rfl: 1 .  loratadine (CLARITIN) 10 MG tablet, Take 10 mg by mouth daily., Disp: , Rfl:  .  Meth-Hyo-M Bl-Na Phos-Ph Sal (URIBEL) 118 MG CAPS, Take 1 capsule by mouth 2 (two) times daily as needed (for pain). , Disp: , Rfl:  .  Multiple Vitamins-Minerals (MULTIVITAMIN WITH MINERALS) tablet, Take 1 tablet by mouth daily., Disp: , Rfl:  .  naproxen (NAPROSYN) 250 MG tablet, Take 250 mg by mouth as needed., Disp: , Rfl:  .  Netarsudil-Latanoprost 0.02-0.005 % SOLN, Place 1 drop into both eyes at bedtime., Disp: , Rfl:  .  nitrofurantoin (MACRODANTIN) 100 MG capsule, Take 1 capsule by mouth at bedtime., Disp: , Rfl:  .  triamcinolone cream (KENALOG) 0.1 %, , Disp: , Rfl:  .  vitamin B-12 (CYANOCOBALAMIN) 100 MCG tablet, Take 100 mcg by mouth daily., Disp: , Rfl:   Past Medical History: Past Medical History:  Diagnosis Date  . Anxiety   . Arthritis   . Depression   . Diverticulosis   . GERD (gastroesophageal reflux disease)   . Glaucoma    Bilateral eyes  .  Hyperlipidemia   . Hypertension   . Peripheral neuropathy   . Pneumonia    hx of  . Urgency of urination     Tobacco Use: Social History   Tobacco Use  Smoking Status Never Smoker  Smokeless Tobacco Never Used    Labs: Recent Review Flowsheet Data    Labs for ITP Cardiac and Pulmonary Rehab Latest Ref Rng & Units 12/20/2018 12/21/2018 12/22/2018 12/23/2018 12/24/2018   Cholestrol 0 - 200 mg/dL - - - - -   LDLCALC 0 - 99 mg/dL - - - - -   LDLDIRECT mg/dL - - - - -   HDL >40 mg/dL - - - - -   Trlycerides <150 mg/dL - - - - -   Hemoglobin A1c 4.8 - 5.6 % - - - - -   PHART 7.350 - 7.450 7.389 - - - -   PCO2ART 32.0 - 48.0 mmHg 37.5 - - - -   HCO3 20.0 - 28.0 mmol/L 22.6 - - - -   TCO2 22 - 32 mmol/L 24 - - - -    ACIDBASEDEF 0.0 - 2.0 mmol/L 2.0 - - - -   O2SAT % 97.0 62.9 60.4 55.7 58.4      Capillary Blood Glucose: Lab Results  Component Value Date   GLUCAP 82 12/21/2018   GLUCAP 73 12/21/2018   GLUCAP 67 (L) 12/21/2018   GLUCAP 106 (H) 12/20/2018   GLUCAP 136 (H) 12/20/2018     Exercise Target Goals: Exercise Program Goal: Individual exercise prescription set using results from initial 6 min walk test and THRR while considering  patient's activity barriers and safety.   Exercise Prescription Goal: Starting with aerobic activity 30 plus minutes a day, 3 days per week for initial exercise prescription. Provide home exercise prescription and guidelines that participant acknowledges understanding prior to discharge.  Activity Barriers & Risk Stratification: Activity Barriers & Cardiac Risk Stratification - 05/22/19 1056      Activity Barriers & Cardiac Risk Stratification   Activity Barriers  Assistive Device;Other (comment);Deconditioning;Muscular Weakness    Comments  Lower extremity neuropathy    Cardiac Risk Stratification  High       6 Minute Walk: 6 Minute Walk    Row Name 05/22/19 1055         6 Minute Walk   Phase  Initial     Distance  1117 feet     Walk Time  6 minutes     # of Rest Breaks  0     MPH  2.1     METS  1.88     RPE  11     Perceived Dyspnea   0     VO2 Peak  6.59     Symptoms  No     Resting HR  83 bpm     Resting BP  130/84     Resting Oxygen Saturation   98 %     Exercise Oxygen Saturation  during 6 min walk  96 %     Max Ex. HR  110 bpm     Max Ex. BP  124/80     2 Minute Post BP  124/62        Oxygen Initial Assessment:   Oxygen Re-Evaluation:   Oxygen Discharge (Final Oxygen Re-Evaluation):   Initial Exercise Prescription: Initial Exercise Prescription - 05/22/19 1100      Date of Initial Exercise RX and Referring Provider   Date  05/22/19  Referring Provider  Dr. Duke Salvia    Expected Discharge Date  07/04/19      NuStep    Level  2    SPM  75    Minutes  30    METs  2      Prescription Details   Frequency (times per week)  3    Duration  Progress to 30 minutes of continuous aerobic without signs/symptoms of physical distress      Intensity   THRR 40-80% of Max Heartrate  56-113    Ratings of Perceived Exertion  11-13      Progression   Progression  Continue to progress workloads to maintain intensity without signs/symptoms of physical distress.      Resistance Training   Training Prescription  Yes    Weight  2 lbs.     Reps  10-15       Perform Capillary Blood Glucose checks as needed.  Exercise Prescription Changes:  Exercise Prescription Changes    Row Name 05/26/19 1600 06/09/19 1515           Response to Exercise   Blood Pressure (Admit)  136/70  124/64      Blood Pressure (Exercise)  152/78  148/86      Blood Pressure (Exit)  118/70  128/66      Heart Rate (Admit)  95 bpm  82 bpm      Heart Rate (Exercise)  102 bpm  108 bpm      Heart Rate (Exit)  89 bpm  83 bpm      Rating of Perceived Exertion (Exercise)  13  13      Symptoms  None  none      Comments  Pt first day of exercise.   -      Duration  Continue with 30 min of aerobic exercise without signs/symptoms of physical distress.  Continue with 30 min of aerobic exercise without signs/symptoms of physical distress.      Intensity  THRR unchanged  THRR unchanged        Progression   Progression  -  Continue to progress workloads to maintain intensity without signs/symptoms of physical distress.      Average METs  -  2        Resistance Training   Training Prescription  Yes  Yes      Weight  2 lbs.   2 lbs.       Reps  10-15  10-15      Time  10 Minutes  10 Minutes        Interval Training   Interval Training  -  No        NuStep   Level  2  2      SPM  75  75      Minutes  30  30      METs  1.9  2         Exercise Comments:  Exercise Comments    Row Name 05/26/19 1629 05/28/19 1526 06/26/19 1457        Exercise Comments  Pt first day of exercise. Pt tolerated exercise well.  Pt fell at home, no exercise today.  Unable to review METs and goals with Pt due to absence form CR program.        Exercise Goals and Review:  Exercise Goals    Row Name 05/22/19 1057  Exercise Goals   Increase Physical Activity  Yes       Intervention  Provide advice, education, support and counseling about physical activity/exercise needs.;Develop an individualized exercise prescription for aerobic and resistive training based on initial evaluation findings, risk stratification, comorbidities and participant's personal goals.       Expected Outcomes  Short Term: Attend rehab on a regular basis to increase amount of physical activity.;Long Term: Add in home exercise to make exercise part of routine and to increase amount of physical activity.;Long Term: Exercising regularly at least 3-5 days a week.       Increase Strength and Stamina  Yes       Intervention  Provide advice, education, support and counseling about physical activity/exercise needs.;Develop an individualized exercise prescription for aerobic and resistive training based on initial evaluation findings, risk stratification, comorbidities and participant's personal goals.       Expected Outcomes  Short Term: Increase workloads from initial exercise prescription for resistance, speed, and METs.;Short Term: Perform resistance training exercises routinely during rehab and add in resistance training at home;Long Term: Improve cardiorespiratory fitness, muscular endurance and strength as measured by increased METs and functional capacity (6MWT)       Able to understand and use rate of perceived exertion (RPE) scale  Yes       Intervention  Provide education and explanation on how to use RPE scale       Expected Outcomes  Short Term: Able to use RPE daily in rehab to express subjective intensity level;Long Term:  Able to use RPE to guide intensity level  when exercising independently       Knowledge and understanding of Target Heart Rate Range (THRR)  Yes       Intervention  Provide education and explanation of THRR including how the numbers were predicted and where they are located for reference       Expected Outcomes  Short Term: Able to state/look up THRR;Long Term: Able to use THRR to govern intensity when exercising independently;Short Term: Able to use daily as guideline for intensity in rehab       Able to check pulse independently  Yes       Intervention  Provide education and demonstration on how to check pulse in carotid and radial arteries.;Review the importance of being able to check your own pulse for safety during independent exercise       Expected Outcomes  Short Term: Able to explain why pulse checking is important during independent exercise;Long Term: Able to check pulse independently and accurately       Understanding of Exercise Prescription  Yes       Intervention  Provide education, explanation, and written materials on patient's individual exercise prescription       Expected Outcomes  Short Term: Able to explain program exercise prescription;Long Term: Able to explain home exercise prescription to exercise independently          Exercise Goals Re-Evaluation : Exercise Goals Re-Evaluation    Row Name 05/26/19 1628 05/28/19 1525 06/26/19 1456         Exercise Goal Re-Evaluation   Exercise Goals Review  Increase Physical Activity;Increase Strength and Stamina;Able to understand and use rate of perceived exertion (RPE) scale;Knowledge and understanding of Target Heart Rate Range (THRR);Understanding of Exercise Prescription  Increase Physical Activity;Increase Strength and Stamina;Able to understand and use rate of perceived exertion (RPE) scale;Knowledge and understanding of Target Heart Rate Range (THRR);Understanding of Exercise Prescription  Increase Physical Activity;Increase  Strength and Stamina;Able to understand and  use rate of perceived exertion (RPE) scale;Knowledge and understanding of Target Heart Rate Range (THRR);Able to check pulse independently;Understanding of Exercise Prescription     Comments  Pt first day of exercise in CR program. Pt tolerated exercise well.  Patient fell at home. No exercise today, unable to review goals with patient.  Unable to review METs and goals with Pt due to absence from CR program. Pt has not attended program in 17 days. Will follow up with Pt.     Expected Outcomes  Will continue to monitor and progress Pt as tolerated.  Progress workloads as tolerated when patient is cleared to resume exercise.  Will follow up with Pt.         Discharge Exercise Prescription (Final Exercise Prescription Changes): Exercise Prescription Changes - 06/09/19 1515      Response to Exercise   Blood Pressure (Admit)  124/64    Blood Pressure (Exercise)  148/86    Blood Pressure (Exit)  128/66    Heart Rate (Admit)  82 bpm    Heart Rate (Exercise)  108 bpm    Heart Rate (Exit)  83 bpm    Rating of Perceived Exertion (Exercise)  13    Symptoms  none    Duration  Continue with 30 min of aerobic exercise without signs/symptoms of physical distress.    Intensity  THRR unchanged      Progression   Progression  Continue to progress workloads to maintain intensity without signs/symptoms of physical distress.    Average METs  2      Resistance Training   Training Prescription  Yes    Weight  2 lbs.     Reps  10-15    Time  10 Minutes      Interval Training   Interval Training  No      NuStep   Level  2    SPM  75    Minutes  30    METs  2       Nutrition:  Target Goals: Understanding of nutrition guidelines, daily intake of sodium 1500mg , cholesterol 200mg , calories 30% from fat and 7% or less from saturated fats, daily to have 5 or more servings of fruits and vegetables.  Biometrics: Pre Biometrics - 05/22/19 1058      Pre Biometrics   Height  5\' 3"  (1.6 m)    Weight   150 lb 9.2 oz (68.3 kg)    Waist Circumference  35 inches    Hip Circumference  39 inches    Waist to Hip Ratio  0.9 %    BMI (Calculated)  26.68    Triceps Skinfold  29 mm    % Body Fat  39.6 %    Grip Strength  26 kg    Flexibility  8 in    Single Leg Stand  3.56 seconds        Nutrition Therapy Plan and Nutrition Goals:   Nutrition Assessments:   Nutrition Goals Re-Evaluation:   Nutrition Goals Discharge (Final Nutrition Goals Re-Evaluation):   Psychosocial: Target Goals: Acknowledge presence or absence of significant depression and/or stress, maximize coping skills, provide positive support system. Participant is able to verbalize types and ability to use techniques and skills needed for reducing stress and depression.  Initial Review & Psychosocial Screening: Initial Psych Review & Screening - 05/15/19 1104      Initial Review   Current issues with  None Identified  Family Dynamics   Good Support System?  Yes      Barriers   Psychosocial barriers to participate in program  There are no identifiable barriers or psychosocial needs.      Screening Interventions   Interventions  Encouraged to exercise       Quality of Life Scores: Quality of Life - 05/15/19 1103      Quality of Life   Select  Quality of Life      Quality of Life Scores   Health/Function Pre  26.89 %    Socioeconomic Pre  30 %    Psych/Spiritual Pre  27.43 %    Family Pre  30 %    GLOBAL Pre  28.02 %      Scores of 19 and below usually indicate a poorer quality of life in these areas.  A difference of  2-3 points is a clinically meaningful difference.  A difference of 2-3 points in the total score of the Quality of Life Index has been associated with significant improvement in overall quality of life, self-image, physical symptoms, and general health in studies assessing change in quality of life.  PHQ-9: Recent Review Flowsheet Data    Depression screen [facility] 2/9 05/26/2019 01/08/2019  11/17/2015 07/09/2014 05/22/2014   Decreased Interest 0 0 0 0 0   Down, Depressed, Hopeless 0 0 0 0 0   PHQ - 2 Score 0 0 0 0 0     Interpretation of Total Score  Total Score Depression Severity:  1-4 = Minimal depression, 5-9 = Mild depression, 10-14 = Moderate depression, 15-19 = Moderately severe depression, 20-27 = Severe depression   Psychosocial Evaluation and Intervention:   Psychosocial Re-Evaluation: Psychosocial Re-Evaluation    Row Name 05/29/19 1515             Psychosocial Re-Evaluation   Current issues with  None Identified       Interventions  Encouraged to attend Cardiac Rehabilitation for the exercise       Continue Psychosocial Services   No Follow up required          Psychosocial Discharge (Final Psychosocial Re-Evaluation): Psychosocial Re-Evaluation - 05/29/19 1515      Psychosocial Re-Evaluation   Current issues with  None Identified    Interventions  Encouraged to attend Cardiac Rehabilitation for the exercise    Continue Psychosocial Services   No Follow up required       Vocational Rehabilitation: Provide vocational rehab assistance to qualifying candidates.   Vocational Rehab Evaluation & Intervention: Vocational Rehab - 05/22/19 1126      Initial Vocational Rehab Evaluation & Intervention   Assessment shows need for Vocational Rehabilitation  No       Education: Education Goals: Education classes will be provided on a weekly basis, covering required topics. Participant will state understanding/return demonstration of topics presented.  Learning Barriers/Preferences:   Education Topics: Hypertension, Hypertension Reduction -Define heart disease and high blood pressure. Discus how high blood pressure affects the body and ways to reduce high blood pressure.   Exercise and Your Heart -Discuss why it is important to exercise, the FITT principles of exercise, normal and abnormal responses to exercise, and how to exercise  safely.   Angina -Discuss definition of angina, causes of angina, treatment of angina, and how to decrease risk of having angina.   Cardiac Medications -Review what the following cardiac medications are used for, how they affect the body, and side effects that may occur when taking the  medications.  Medications include Aspirin, Beta blockers, calcium channel blockers, ACE Inhibitors, angiotensin receptor blockers, diuretics, digoxin, and antihyperlipidemics.   Congestive Heart Failure -Discuss the definition of CHF, how to live with CHF, the signs and symptoms of CHF, and how keep track of weight and sodium intake.   Heart Disease and Intimacy -Discus the effect sexual activity has on the heart, how changes occur during intimacy as we age, and safety during sexual activity.   Smoking Cessation / COPD -Discuss different methods to quit smoking, the health benefits of quitting smoking, and the definition of COPD.   Nutrition I: Fats -Discuss the types of cholesterol, what cholesterol does to the heart, and how cholesterol levels can be controlled.   Nutrition II: Labels -Discuss the different components of food labels and how to read food label   Heart Parts/Heart Disease and PAD -Discuss the anatomy of the heart, the pathway of blood circulation through the heart, and these are affected by heart disease.   Stress I: Signs and Symptoms -Discuss the causes of stress, how stress may lead to anxiety and depression, and ways to limit stress.   Stress II: Relaxation -Discuss different types of relaxation techniques to limit stress.   Warning Signs of Stroke / TIA -Discuss definition of a stroke, what the signs and symptoms are of a stroke, and how to identify when someone is having stroke.   Knowledge Questionnaire Score: Knowledge Questionnaire Score - 05/15/19 1103      Knowledge Questionnaire Score   Pre Score  22/24       Core Components/Risk Factors/Patient Goals  at Admission: Personal Goals and Risk Factors at Admission - 05/22/19 1101      Core Components/Risk Factors/Patient Goals on Admission    Weight Management  Yes;Weight Maintenance;Weight Loss;Obesity    Admit Weight  150 lb 9.2 oz (68.3 kg)    Hypertension  Yes    Intervention  Provide education on lifestyle modifcations including regular physical activity/exercise, weight management, moderate sodium restriction and increased consumption of fresh fruit, vegetables, and low fat dairy, alcohol moderation, and smoking cessation.;Monitor prescription use compliance.    Expected Outcomes  Short Term: Continued assessment and intervention until BP is < 140/32mm HG in hypertensive participants. < 130/57mm HG in hypertensive participants with diabetes, heart failure or chronic kidney disease.;Long Term: Maintenance of blood pressure at goal levels.    Lipids  Yes    Intervention  Provide education and support for participant on nutrition & aerobic/resistive exercise along with prescribed medications to achieve LDL 70mg , HDL >40mg .    Expected Outcomes  Short Term: Participant states understanding of desired cholesterol values and is compliant with medications prescribed. Participant is following exercise prescription and nutrition guidelines.;Long Term: Cholesterol controlled with medications as prescribed, with individualized exercise RX and with personalized nutrition plan. Value goals: LDL < 70mg , HDL > 40 mg.       Core Components/Risk Factors/Patient Goals Review:  Goals and Risk Factor Review    Row Name 05/29/19 1516 06/26/19 1449           Core Components/Risk Factors/Patient Goals Review   Personal Goals Review  Weight Management/Obesity;Hypertension;Lipids  Weight Management/Obesity;Hypertension;Lipids      Review  Melissa Gonzales's vital signs have been stable so far. Melissa Gonzales uses her rolling walker and a chair while doing cool down stretches for stability.  Melissa Gonzales has been absent since 06/09/19.  Unable to assess      Expected Outcomes  Patient will continue to particpate in cardiac  rehab for exercise, lifestyle and nutrtion modifications  Patient will continue to particpate in cardiac rehab for exercise, lifestyle and nutrtion modifications         Core Components/Risk Factors/Patient Goals at Discharge (Final Review):  Goals and Risk Factor Review - 06/26/19 1449      Core Components/Risk Factors/Patient Goals Review   Personal Goals Review  Weight Management/Obesity;Hypertension;Lipids    Review  Melissa Gonzales has been absent since 06/09/19. Unable to assess    Expected Outcomes  Patient will continue to particpate in cardiac rehab for exercise, lifestyle and nutrtion modifications       ITP Comments: ITP Comments    Row Name 05/15/19 1021 05/22/19 1124 05/29/19 1514 06/26/19 1448     ITP Comments  Dr. Armanda Magic, Medical Director  Dr. Armanda Magic, Medical Director  30 Day ITP Review. Melissa Gonzales is off to a good start to exercise.  30 Day ITP Review. Melissa Gonzales's last day of participation was on 06/09/19.       Comments: See ITP comments.Gladstone Lighter, RN,BSN 06/26/2019 4:27 PM

## 2019-06-27 ENCOUNTER — Encounter (HOSPITAL_COMMUNITY): Payer: Self-pay | Admitting: *Deleted

## 2019-06-27 ENCOUNTER — Encounter (HOSPITAL_COMMUNITY): Payer: Medicare Other

## 2019-06-27 ENCOUNTER — Telehealth (HOSPITAL_COMMUNITY): Payer: Self-pay | Admitting: *Deleted

## 2019-06-27 DIAGNOSIS — Z951 Presence of aortocoronary bypass graft: Secondary | ICD-10-CM

## 2019-06-27 NOTE — Telephone Encounter (Signed)
Spoke with Melissa Gonzales. Mushka will not be returning to cardiac rehab. Kaoir says that her gym has opened back up and that she has hired a IT consultant. Will discharge from the [facility], RN,BSN 06/27/2019 1:52 PM

## 2019-06-27 NOTE — Progress Notes (Signed)
Discharge Progress Report  Patient Details  Name: Melissa Gonzales MRN: [id number] Date of Birth: 1941-04-11 Referring Provider:     CARDIAC REHAB PHASE II [facility]  Referring Provider  Dr. 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 Duke Salviaandolph    Expected Discharge Date  07/04/19      NuStep   Level  2    SPM  75    Minutes  30    METs  2      Prescription Details   Frequency (times per week)  3    Duration  Progress to 30 minutes of continuous aerobic without signs/symptoms of physical distress      Intensity   THRR 40-80% of Max Heartrate  56-113    Ratings of Perceived Exertion  11-13      Progression   Progression  Continue to progress workloads to maintain intensity without signs/symptoms of physical distress.      Resistance Training   Training Prescription  Yes    Weight  2 lbs.     Reps  10-15       Discharge Exercise Prescription (Final Exercise Prescription Changes): Exercise Prescription Changes - 06/09/19 1515      Response to Exercise   Blood Pressure (Admit)  124/64    Blood Pressure (Exercise)  148/86    Blood Pressure (Exit)  128/66    Heart Rate (Admit)  82 bpm    Heart Rate (Exercise)  108 bpm    Heart Rate (Exit)  83 bpm    Rating of Perceived Exertion (Exercise)  13    Symptoms  none    Duration  Continue with 30 min of aerobic exercise without signs/symptoms of physical distress.    Intensity  THRR unchanged      Progression   Progression  Continue to progress workloads to  maintain intensity without signs/symptoms of physical distress.    Average METs  2      Resistance Training   Training Prescription  Yes    Weight  2 lbs.     Reps  10-15    Time  10 Minutes      Interval Training   Interval Training  No      NuStep   Level  2    SPM  75    Minutes  30    METs  2       Functional Capacity: 6 Minute Walk    Row Name 05/22/19 1055         6 Minute Walk   Phase  Initial     Distance  1117 feet     Walk Time  6 minutes     # of Rest Breaks  0     MPH  2.1     METS  1.88     RPE  11     Perceived Dyspnea   0     VO2 Peak  6.59  Symptoms  No     Resting HR  83 bpm     Resting BP  130/84     Resting Oxygen Saturation   98 %     Exercise Oxygen Saturation  during 6 min walk  96 %     Max Ex. HR  110 bpm     Max Ex. BP  124/80     2 Minute Post BP  124/62        Psychological, QOL, Others - Outcomes: PHQ 2/9: Depression screen [facility] 2/9 05/26/2019 01/08/2019 11/17/2015 07/09/2014 05/22/2014  Decreased Interest 0 0 0 0 0  Down, Depressed, Hopeless 0 0 0 0 0  PHQ - 2 Score 0 0 0 0 0  Altered sleeping - - - - -  Tired, decreased energy - - - - -  Change in appetite - - - - -  Feeling bad or failure about yourself  - - - - -  Trouble concentrating - - - - -  Moving slowly or fidgety/restless - - - - -  Suicidal thoughts - - - - -  PHQ-9 Score - - - - -    Quality of Life: Quality of Life - 05/15/19 1103      Quality of Life   Select  Quality of Life      Quality of Life Scores   Health/Function Pre  26.89 %    Socioeconomic Pre  30 %    Psych/Spiritual Pre  27.43 %    Family Pre  30 %    GLOBAL Pre  28.02 %       Personal Goals: Goals established at orientation with interventions provided to work toward goal. Personal Goals and Risk Factors at Admission - 05/22/19 1101      Core Components/Risk Factors/Patient Goals on Admission    Weight Management  Yes;Weight Maintenance;Weight Loss;Obesity    Admit Weight  150 lb  9.2 oz (68.3 kg)    Hypertension  Yes    Intervention  Provide education on lifestyle modifcations including regular physical activity/exercise, weight management, moderate sodium restriction and increased consumption of fresh fruit, vegetables, and low fat dairy, alcohol moderation, and smoking cessation.;Monitor prescription use compliance.    Expected Outcomes  Short Term: Continued assessment and intervention until BP is < 140/40mm HG in hypertensive participants. < 130/60mm HG in hypertensive participants with diabetes, heart failure or chronic kidney disease.;Long Term: Maintenance of blood pressure at goal levels.    Lipids  Yes    Intervention  Provide education and support for participant on nutrition & aerobic/resistive exercise along with prescribed medications to achieve LDL 70mg , HDL >40mg .    Expected Outcomes  Short Term: Participant states understanding of desired cholesterol values and is compliant with medications prescribed. Participant is following exercise prescription and nutrition guidelines.;Long Term: Cholesterol controlled with medications as prescribed, with individualized exercise RX and with personalized nutrition plan. Value goals: LDL < 70mg , HDL > 40 mg.        Personal Goals Discharge: Goals and Risk Factor Review    Row Name 05/29/19 1516 06/26/19 1449           Core Components/Risk Factors/Patient Goals Review   Personal Goals Review  Weight Management/Obesity;Hypertension;Lipids  Weight Management/Obesity;Hypertension;Lipids      Review  Jessia's vital signs have been stable so far. Makyah uses her rolling walker and a chair while doing cool down stretches for stability.  Dorna has been absent since 06/09/19. Unable to assess  Expected Outcomes  Patient will continue to particpate in cardiac rehab for exercise, lifestyle and nutrtion modifications  Patient will continue to particpate in cardiac rehab for exercise, lifestyle and nutrtion modifications          Exercise Goals and Review: Exercise Goals    Row Name 05/22/19 1057             Exercise Goals   Increase Physical Activity  Yes       Intervention  Provide advice, education, support and counseling about physical activity/exercise needs.;Develop an individualized exercise prescription for aerobic and resistive training based on initial evaluation findings, risk stratification, comorbidities and participant's personal goals.       Expected Outcomes  Short Term: Attend rehab on a regular basis to increase amount of physical activity.;Long Term: Add in home exercise to make exercise part of routine and to increase amount of physical activity.;Long Term: Exercising regularly at least 3-5 days a week.       Increase Strength and Stamina  Yes       Intervention  Provide advice, education, support and counseling about physical activity/exercise needs.;Develop an individualized exercise prescription for aerobic and resistive training based on initial evaluation findings, risk stratification, comorbidities and participant's personal goals.       Expected Outcomes  Short Term: Increase workloads from initial exercise prescription for resistance, speed, and METs.;Short Term: Perform resistance training exercises routinely during rehab and add in resistance training at home;Long Term: Improve cardiorespiratory fitness, muscular endurance and strength as measured by increased METs and functional capacity (6MWT)       Able to understand and use rate of perceived exertion (RPE) scale  Yes       Intervention  Provide education and explanation on how to use RPE scale       Expected Outcomes  Short Term: Able to use RPE daily in rehab to express subjective intensity level;Long Term:  Able to use RPE to guide intensity level when exercising independently       Knowledge and understanding of Target Heart Rate Range (THRR)  Yes       Intervention  Provide education and explanation of THRR including how the  numbers were predicted and where they are located for reference       Expected Outcomes  Short Term: Able to state/look up THRR;Long Term: Able to use THRR to govern intensity when exercising independently;Short Term: Able to use daily as guideline for intensity in rehab       Able to check pulse independently  Yes       Intervention  Provide education and demonstration on how to check pulse in carotid and radial arteries.;Review the importance of being able to check your own pulse for safety during independent exercise       Expected Outcomes  Short Term: Able to explain why pulse checking is important during independent exercise;Long Term: Able to check pulse independently and accurately       Understanding of Exercise Prescription  Yes       Intervention  Provide education, explanation, and written materials on patient's individual exercise prescription       Expected Outcomes  Short Term: Able to explain program exercise prescription;Long Term: Able to explain home exercise prescription to exercise independently          Exercise Goals Re-Evaluation: Exercise Goals Re-Evaluation    Row Name 05/26/19 1628 05/28/19 1525 06/26/19 1456         Exercise Goal Re-Evaluation  Exercise Goals Review  Increase Physical Activity;Increase Strength and Stamina;Able to understand and use rate of perceived exertion (RPE) scale;Knowledge and understanding of Target Heart Rate Range (THRR);Understanding of Exercise Prescription  Increase Physical Activity;Increase Strength and Stamina;Able to understand and use rate of perceived exertion (RPE) scale;Knowledge and understanding of Target Heart Rate Range (THRR);Understanding of Exercise Prescription  Increase Physical Activity;Increase Strength and Stamina;Able to understand and use rate of perceived exertion (RPE) scale;Knowledge and understanding of Target Heart Rate Range (THRR);Able to check pulse independently;Understanding of Exercise Prescription      Comments  Pt first day of exercise in CR program. Pt tolerated exercise well.  Patient fell at home. No exercise today, unable to review goals with patient.  Unable to review METs and goals with Pt due to absence from CR program. Pt has not attended program in 17 days. Will follow up with Pt.     Expected Outcomes  Will continue to monitor and progress Pt as tolerated.  Progress workloads as tolerated when patient is cleared to resume exercise.  Will follow up with Pt.        Nutrition & Weight - Outcomes: Pre Biometrics - 05/22/19 1058      Pre Biometrics   Height  5\' 3"  (1.6 m)    Weight  150 lb 9.2 oz (68.3 kg)    Waist Circumference  35 inches    Hip Circumference  39 inches    Waist to Hip Ratio  0.9 %    BMI (Calculated)  26.68    Triceps Skinfold  29 mm    % Body Fat  39.6 %    Grip Strength  26 kg    Flexibility  8 in    Single Leg Stand  3.56 seconds        Nutrition:   Nutrition Discharge:   Education Questionnaire Score: Knowledge Questionnaire Score - 05/15/19 1103      Knowledge Questionnaire Score   Pre Score  22/24       Jettie attended 4 exercise session including orientation from 05/26/19-06/09/19. Donnice's attendance was poor. Okey RegalCarol stopped participating in cardiac rehab as she has returned to exercise at her gym and is working with a physical Agricultural consultanttherapist/ personal trainer.Gladstone LighterMaria Ringo Sherod, RN,BSN 07/02/2019 8:52 AM

## 2019-06-30 ENCOUNTER — Encounter (HOSPITAL_COMMUNITY): Payer: Medicare Other

## 2019-07-02 ENCOUNTER — Encounter (HOSPITAL_COMMUNITY): Payer: Medicare Other

## 2019-07-02 ENCOUNTER — Encounter: Payer: Self-pay | Admitting: Internal Medicine

## 2019-07-04 ENCOUNTER — Encounter (HOSPITAL_COMMUNITY): Payer: Medicare Other

## 2019-07-15 ENCOUNTER — Other Ambulatory Visit: Payer: Self-pay | Admitting: Internal Medicine

## 2019-07-15 DIAGNOSIS — Z1231 Encounter for screening mammogram for malignant neoplasm of breast: Secondary | ICD-10-CM

## 2019-07-17 ENCOUNTER — Ambulatory Visit: Payer: Medicare Other

## 2019-08-27 ENCOUNTER — Other Ambulatory Visit: Payer: Self-pay

## 2019-08-27 ENCOUNTER — Ambulatory Visit
Admission: RE | Admit: 2019-08-27 | Discharge: 2019-08-27 | Disposition: A | Discharge status: Home / Self Care | Payer: Medicare Other | Source: Ambulatory Visit | Attending: Internal Medicine | Admitting: Internal Medicine

## 2019-08-27 DIAGNOSIS — Z1231 Encounter for screening mammogram for malignant neoplasm of breast: Secondary | ICD-10-CM

## 2019-09-02 DIAGNOSIS — Z961 Presence of intraocular lens: Secondary | ICD-10-CM | POA: Insufficient documentation

## 2019-09-02 DIAGNOSIS — H4423 Degenerative myopia, bilateral: Secondary | ICD-10-CM | POA: Insufficient documentation

## 2019-09-24 NOTE — Progress Notes (Signed)
Cardiology Office Note   Date:  09/25/2019   ID:  Melissa Gonzales, DOB 02-29-1941, MRN [id number]  PCP:  Rodrigo Ran, MD  Cardiologist:   Chilton Si, MD   No chief complaint on file.    History of Present Illness: Melissa Gonzales is a 78 y.o. female with CAD s/p CABG (LIMA-LAD, SVG-PDA, SVG-OM1) who presents for follow up.  She was initially admitted 12/2018 with NSTEMI.  She underwent LHC and was found to have 3 vessel CAD.  She underwent three-vessel CABG with Dr. Donata Clay on 12/19/18.  She required diuresis after discharge.  She was started on HCTZ 01/2019 due to poorly controlled hypertension. She saw Corine Shelter virtually on 03/2019 and complained of fatigue and depression.  She has been treated with Lexapro and recently added Wellbutrin.  She thinks that this has helped but she still feels depressed.  She finds it hard to wake up in the morning but then does not feel tired later during the day.  She walks for exercise for 5 days/week for up to an hour.  She feels okay with walking and has no exertional chest pain or shortness of breath.  She had some swelling in her left leg but this seems to be improving with the compression stock.  She is also noted some discoloration in her left leg that her PCP told her is due to venous insufficiency.  Compression seems to help this as well.  She had some discoloration in her legs before surgery but thinks it has been worse since having her operation.  She checks her blood pressure at home and it typically is less than 130/80.  She saw her PCP last week and it was even lower than this.   Past Medical History:  Diagnosis Date  . Anxiety   . Arthritis   . Depression   . Diverticulosis   . GERD (gastroesophageal reflux disease)   . Glaucoma    Bilateral eyes  . Hyperlipidemia   . Hypertension   . Peripheral neuropathy   . Pneumonia    hx of  . Urgency of urination     Past Surgical History:  Procedure Laterality Date  . BACK  SURGERY    . CHOLECYSTECTOMY  1993  . COLONOSCOPY    . CORONARY ARTERY BYPASS GRAFT N/A 12/19/2018   Procedure: CORONARY ARTERY BYPASS GRAFTING (CABG)x3, using left internal mammary arteryand right and left greater saphenous veins harvested endoscopically;  Surgeon: Kerin Perna, MD;  Location: [facility] OR;  Service: Open Heart Surgery;  Laterality: N/A;  . EYE SURGERY     Cataract Surgery Bilateral, 5 years ago  . LEFT HEART CATH AND CORONARY ANGIOGRAPHY N/A 12/17/2018   Procedure: LEFT HEART CATH AND CORONARY ANGIOGRAPHY;  Surgeon: Lennette Bihari, MD;  Location: [facility] INVASIVE CV LAB;  Service: Cardiovascular;  Laterality: N/A;  . MAXIMUM ACCESS (MAS)POSTERIOR LUMBAR INTERBODY FUSION (PLIF) 1 LEVEL N/A 03/17/2015   Procedure: Lumbar five -sacral one Maximum access posterior lumbar interbody fusion/Lumbar two-three  Laminectomy and extension of instrumentation to Sacral-one;  Surgeon: Tia Alert, MD;  Location: [facility] NEURO ORS;  Service: Neurosurgery;  Laterality: N/A;  . MAXIMUM ACCESS (MAS)POSTERIOR LUMBAR INTERBODY FUSION (PLIF) 2 LEVEL N/A 11/27/2014   Procedure: LUMBAR THREE-FOUR, LUMBAR FOUR-FIVE MAXIMUM ACCESS POSTERIOR LUMBAR INTERBODY FUSION;  Surgeon: Tia Alert, MD;  Location: [facility] NEURO ORS;  Service: Neurosurgery;  Laterality: N/A;  L3-4 L4-5 MAXIMUM ACCESS POSTERIOR LUMBAR INTERBODY FUSION  . TEE WITHOUT CARDIOVERSION  N/A 12/19/2018   Procedure: TRANSESOPHAGEAL ECHOCARDIOGRAM (TEE);  Surgeon: Donata ClayVan Trigt, Theron AristaPeter, MD;  [facility];  Service: Open Heart Surgery;  Laterality: N/A;  . VAGINAL HYSTERECTOMY  1970s   uterine prolapse     Current Outpatient Medications  Medication Sig Dispense Refill  . ALPHAGAN P 0.1 % SOLN Place 1 drop into both eyes every 12 (twelve) hours.     . ALPRAZolam (XANAX) 0.25 MG tablet TAKE 1 TABLET BY MOUTH TWICE DAILY AS NEEDED FOR ANXIETY (Patient taking differently: Take 0.25 mg by mouth 2 (two) times daily as needed for anxiety. ) 30 tablet 0  . aspirin 81  MG chewable tablet Chew 81 mg by mouth daily.    Marland Kitchen. atorvastatin (LIPITOR) 80 MG tablet Take 80 mg by mouth daily.    Marland Kitchen. buPROPion (WELLBUTRIN XL) 150 MG 24 hr tablet Take 150 mg by mouth every morning.    . Calcium Carb-Cholecalciferol (CALCIUM 600 + D PO) Take 1 tablet by mouth daily.     . carvedilol (COREG) 6.25 MG tablet Take 1 tablet (6.25 mg total) by mouth 2 (two) times daily with a meal. 60 tablet 1  . dorzolamide-timolol (COSOPT) 22.3-6.8 MG/ML ophthalmic solution Place 1 drop into both eyes 2 (two) times daily.     Marland Kitchen. ELMIRON 100 MG capsule Take 100 mg by mouth 3 (three) times daily.    Marland Kitchen. escitalopram (LEXAPRO) 10 MG tablet Take 10 mg by mouth daily.    Marland Kitchen. esomeprazole (NEXIUM) 40 MG capsule Take 1 capsule (40 mg total) by mouth daily before breakfast. 90 capsule 3  . Fluocinolone Acetonide Scalp 0.01 % OIL     . levothyroxine (SYNTHROID, LEVOTHROID) 25 MCG tablet Take 1 tablet (25 mcg total) by mouth daily at 6 (six) AM. 30 tablet 1  . loratadine (CLARITIN) 10 MG tablet Take 10 mg by mouth daily.    . Meth-Hyo-M Bl-Na Phos-Ph Sal (URIBEL) 118 MG CAPS Take 1 capsule by mouth 2 (two) times daily as needed (for pain).     . Multiple Vitamins-Minerals (MULTIVITAMIN WITH MINERALS) tablet Take 1 tablet by mouth daily.    . naproxen (NAPROSYN) 250 MG tablet Take 250 mg by mouth as needed.    . Netarsudil-Latanoprost 0.02-0.005 % SOLN Place 1 drop into both eyes at bedtime.    . nitrofurantoin (MACRODANTIN) 100 MG capsule Take 1 capsule by mouth at bedtime.    . triamcinolone cream (KENALOG) 0.1 %     . vitamin B-12 (CYANOCOBALAMIN) 100 MCG tablet Take 100 mcg by mouth daily.    . hydrochlorothiazide (MICROZIDE) 12.5 MG capsule Take 1 capsule (12.5 mg total) by mouth daily. 90 capsule 3   No current facility-administered medications for this visit.     Allergies:   Patient has no known allergies.    Social History:  The patient  reports that she has never smoked. She has never used  smokeless tobacco. She reports current alcohol use of about 1.0 standard drinks of alcohol per week. She reports that she does not use drugs.   Family History:  The patient's family history includes Cancer in her father; Drug abuse in her grandchild; Heart disease (age of onset: 7486) in her mother; Hyperlipidemia in her sister; Hypertension in her sister; Mental illness in her daughter.    ROS:  Please see the history of present illness.   Otherwise, review of systems are positive for none.   All other systems are reviewed and negative.    PHYSICAL EXAM:  VS:  BP (!) 168/80   Pulse 79   Temp (!) 96.6 F (35.9 C)   Ht 5\' 3"  (1.6 m)   Wt 154 lb 9.6 oz (70.1 kg)   SpO2 95%   BMI 27.39 kg/m  , BMI Body mass index is 27.39 kg/m. GENERAL:  Well appearing HEENT:  Pupils equal round and reactive, fundi not visualized, oral mucosa unremarkable NECK:  No jugular venous distention, waveform within normal limits, carotid upstroke brisk and symmetric, no bruits LUNGS:  Clear to auscultation bilaterally HEART:  RRR.  PMI not displaced or sustained,S1 and S2 within normal limits, no S3, no S4, no clicks, no rubs, no murmurs ABD:  Flat, positive bowel sounds normal in frequency in pitch, no bruits, no rebound, no guarding, no midline pulsatile mass, no hepatomegaly, no splenomegaly EXT:  2 plus pulses throughout, no edema, no cyanosis no clubbing SKIN:  No rashes no nodules NEURO:  Cranial nerves II through XII grossly intact, motor grossly intact throughout PSYCH:  Cognitively intact, oriented to person place and time   EKG:  EKG is ordered today. The ekg ordered today demonstrates sinus rhythm.  Rate 72 bpm.   LHC 12/17/18:  Ost RCA to Mid RCA lesion is 100% stenosed.  Prox Cx lesion is 40% stenosed.  Mid Cx to Dist Cx lesion is 85% stenosed.  RPDA lesion is 95% stenosed.  Ost RPDA to RPDA lesion is 90% stenosed.  Dist LM to Ost LAD lesion is 35% stenosed.  Prox LAD lesion is 70%  stenosed.  Prox LAD to Mid LAD lesion is 90% stenosed.   Echo 12/15/18:  IMPRESSIONS    1. The left ventricle has hyperdynamic systolic function of >65%. The cavity size was normal. There is mildly increased left ventricular wall thickness. Echo evidence of impaired diastolic relaxation Elevated left ventricular end-diastolic pressure The  E/e' is 15.6.  2. The right ventricle has normal systolic function. The cavity was normal. There is no increase in right ventricular wall thickness.  3. The mitral valve is normal in structure. There is mild mitral annular calcification present.  4. The tricuspid valve is normal in structure.Tricuspid valve regurgitation is mild by color flow Doppler.  5. The aortic valve is tricuspid Aortic valve regurgitation is moderate by color flow Doppler.  6. The pulmonic valve was normal in structure.  Recent Labs: 12/14/2018: TSH 5.337 12/20/2018: Magnesium 2.2 12/24/2018: ALT 22 12/26/2018: BUN 7; Creatinine, Ser 0.54; Hemoglobin 8.2; Platelets 285; Potassium 3.6; Sodium 137    Lipid Panel    Component Value Date/Time   CHOL 177 12/15/2018 0259   TRIG 116 12/15/2018 0259   HDL 57 12/15/2018 0259   CHOLHDL 3.1 12/15/2018 0259   VLDL 23 12/15/2018 0259   LDLCALC 97 12/15/2018 0259   LDLDIRECT 118 (H) 06/02/2008 2046      Wt Readings from Last 3 Encounters:  09/25/19 154 lb 9.6 oz (70.1 kg)  05/22/19 150 lb 9.2 oz (68.3 kg)  03/12/19 150 lb (68 kg)      ASSESSMENT AND PLAN:  # CAD s/p CABG: Stable without angina.  She should be taking aspirin 81mg  only.  She has been alternating with 325mg .  Continue carvedilol and atorvastatin.  # Hyperlipidemia:  Lipids are not at goal.  We will have her see our pharmacist to consider starting a PCSK9 inhibitor.  Continue atorvastatin.  # Essential hypertension: Blood pressure was poorly controlled here both initially and on repeat.  However she has recently had a check  with her PCP and it was well  controlled.  She checks it regularly at home and it is controlled.  She is going to track it every day for the next couple weeks and make sure it is staying less than 130/80.  For now continue carvedilol and hydrochlorothiazide.   Current medicines are reviewed at length with the patient today.  The patient does not have concerns regarding medicines.  The following changes have been made:  Start PCSK9 inhibitor with PharmD  Labs/ tests ordered today include:   Orders Placed This Encounter  Procedures  . EKG 12-Lead     Disposition:   FU with Adriella Essex C. Oval Linsey, MD, [facility] in 4 months.      Signed, [place] Name C. Oval Linsey, MD, [facility]  09/25/2019 4:50 PM    [place]

## 2019-09-25 ENCOUNTER — Encounter: Payer: Self-pay | Admitting: Cardiovascular Disease

## 2019-09-25 ENCOUNTER — Other Ambulatory Visit: Payer: Self-pay

## 2019-09-25 ENCOUNTER — Ambulatory Visit: Payer: Medicare Other | Admitting: Cardiovascular Disease

## 2019-09-25 VITALS — BP 168/80 | HR 79 | Temp 96.6°F | Ht 63.0 in | Wt 154.6 lb

## 2019-09-25 DIAGNOSIS — I10 Essential (primary) hypertension: Secondary | ICD-10-CM | POA: Diagnosis not present

## 2019-09-25 DIAGNOSIS — E7800 Pure hypercholesterolemia, unspecified: Secondary | ICD-10-CM

## 2019-09-25 DIAGNOSIS — Z951 Presence of aortocoronary bypass graft: Secondary | ICD-10-CM

## 2019-09-25 MED ORDER — HYDROCHLOROTHIAZIDE 12.5 MG PO CAPS: 12.5000 mg | ORAL_CAPSULE | Freq: Every day | ORAL | 3 refills | Status: DC

## 2019-09-25 NOTE — Patient Instructions (Addendum)
Medication Instructions:  Your physician recommends that you continue on your current medications as directed. Please refer to the Current Medication list given to you today.  *If you need a refill on your cardiac medications before your next appointment, please call your pharmacy*  Lab Work: NONE   Testing/Procedures: NONE  Follow-Up: At [facility], you and your health needs are our priority.  As part of our continuing mission to provide you with exceptional heart care, we have created designated Provider Care Teams.  These Care Teams include your primary Cardiologist (physician) and Advanced Practice Providers (APPs -  Physician Assistants and Nurse Practitioners) who all work together to provide you with the care you need, when you need it.  Your next appointment:   4 month(s)  The format for your next appointment:   Either In Person or Virtual  Provider:   You may see Skeet Latch, MD or one of the following Advanced Practice Providers on your designated Care Team:    Kerin Ransom, PA-C  [place], [place]  Coletta Memos, [place]  Your physician recommends that you schedule a follow-up appointment in: [place]   Other Instructions MONITOR YOUR BLOOD PRESSURE DAILY, CALL THE OFFICE IF IT IS NOT CONSISTENTLY LESS THAN 130/80

## 2019-09-30 ENCOUNTER — Ambulatory Visit: Payer: Medicare Other

## 2019-09-30 NOTE — Progress Notes (Deleted)
Patient ID: Melissa Gonzales                 DOB: [birth year]                    MRN: [id number]     HPI: Melissa Gonzales is a 78 y.o. female patient referred to lipid clinic by Dr Duke Salvia. PMH is significant for NSTEMI in 12/2018, CABG x 3 vessels, hypertension, depression, glaucoma, and peripheral neuropathy.   Current Medications:    Intolerances:  Atorvastatin 10mg  daily Atorvastatin 40mg  daily Ezetimibe 10mg  daily Lovaza 1gm twice daily  LDL goal: 70mg /dL or drop from baseline  Diet:   Exercise: walks 5 days per week for 1 hour each day  Family History: family history includes Cancer in her father; Drug abuse in her grandchild; Heart disease (age of onset: 56) in her mother; Hyperlipidemia in her sister; Hypertension in her sister; Mental illness in her daughter.   Social History: patient  reports that she has never smoked. She has never used smokeless tobacco. She reports current alcohol use of about 1.0 standard drinks of alcohol per week. She reports that she does not use drugs.   Labs:  Past Medical History:  Diagnosis Date  . Anxiety   . Arthritis   . Depression   . Diverticulosis   . GERD (gastroesophageal reflux disease)   . Glaucoma    Bilateral eyes  . Hyperlipidemia   . Hypertension   . Peripheral neuropathy   . Pneumonia    hx of  . Urgency of urination     Current Outpatient Medications on File Prior to Visit  Medication Sig Dispense Refill  . ALPHAGAN P 0.1 % SOLN Place 1 drop into both eyes every 12 (twelve) hours.     . ALPRAZolam (XANAX) 0.25 MG tablet TAKE 1 TABLET BY MOUTH TWICE DAILY AS NEEDED FOR ANXIETY (Patient taking differently: Take 0.25 mg by mouth 2 (two) times daily as needed for anxiety. ) 30 tablet 0  . aspirin 81 MG chewable tablet Chew 81 mg by mouth daily.    atorvastatin (LIPITOR) 80 MG tablet Take 80 mg by mouth daily.    17% buPROPion (WELLBUTRIN XL) 150 MG 24 hr tablet Take 150 mg by mouth every morning.    . Calcium  Carb-Cholecalciferol (CALCIUM 600 + D PO) Take 1 tablet by mouth daily.     . carvedilol (COREG) 6.25 MG tablet Take 1 tablet (6.25 mg total) by mouth 2 (two) times daily with a meal. 60 tablet 1  . dorzolamide-timolol (COSOPT) 22.3-6.8 MG/ML ophthalmic solution Place 1 drop into both eyes 2 (two) times daily.     88 ELMIRON 100 MG capsule Take 100 mg by mouth 3 (three) times daily.    Marland Kitchen escitalopram (LEXAPRO) 10 MG tablet Take 10 mg by mouth daily.    Marland Kitchen esomeprazole (NEXIUM) 40 MG capsule Take 1 capsule (40 mg total) by mouth daily before breakfast. 90 capsule 3  . Fluocinolone Acetonide Scalp 0.01 % OIL     . hydrochlorothiazide (MICROZIDE) 12.5 MG capsule Take 1 capsule (12.5 mg total) by mouth daily. 90 capsule 3  . levothyroxine (SYNTHROID, LEVOTHROID) 25 MCG tablet Take 1 tablet (25 mcg total) by mouth daily at 6 (six) AM. 30 tablet 1  . loratadine (CLARITIN) 10 MG tablet Take 10 mg by mouth daily.    . Meth-Hyo-M Bl-Na Phos-Ph Sal (URIBEL) 118 MG CAPS Take 1 capsule by mouth 2 (two)  times daily as needed (for pain).     . Multiple Vitamins-Minerals (MULTIVITAMIN WITH MINERALS) tablet Take 1 tablet by mouth daily.    . naproxen (NAPROSYN) 250 MG tablet Take 250 mg by mouth as needed.    . Netarsudil-Latanoprost 0.02-0.005 % SOLN Place 1 drop into both eyes at bedtime.    . nitrofurantoin (MACRODANTIN) 100 MG capsule Take 1 capsule by mouth at bedtime.    . triamcinolone cream (KENALOG) 0.1 %     . vitamin B-12 (CYANOCOBALAMIN) 100 MCG tablet Take 100 mcg by mouth daily.     No current facility-administered medications on file prior to visit.     No Known Allergies  No problem-specific Assessment & Plan notes found for this encounter.      [name] PharmD, BCPS, [place] [id number] 09/30/2019 8:06 AM

## 2019-10-15 ENCOUNTER — Telehealth: Payer: Self-pay | Admitting: Cardiovascular Disease

## 2019-10-15 NOTE — Telephone Encounter (Signed)
Scheduled for APP visit on 12/10 @ 1130am. Patient aware.

## 2019-10-15 NOTE — Telephone Encounter (Signed)
Pt c/o BP issue: STAT if pt c/o blurred vision, one-sided weakness or slurred speech  1. What are your last 5 BP readings? 188/100 137/86   2. Are you having any other symptoms (ex. Dizziness, headache, blurred vision, passed out)?    Little tigthness in  chest  3. What is your BP issue? Blood pressure is going up and down

## 2019-10-15 NOTE — Telephone Encounter (Signed)
Okay for visit with Latina Craver

## 2019-10-15 NOTE — Telephone Encounter (Signed)
Spoke with patient of Dr. Oval Linsey who c/o fluctuating BP and chest tightness  She saw MD 11/19 - she reports her BP at this visit was "extremely high" - typically 130/80  She recently started wellbutrin - which she reports can increase BP She stopped this medication per Dr. Joylene Draft (did not taper off) and her last dose was 12/1.  She has not noticed a significant decrease in BP since  Yesterday 165/95, 138/80 in PM Today 188/100, 137/86 and 126/75 in PM  Patient takes coreg BID and hctz in the morning Her high BP readings are taken before she takes her medications  She reports chest tightness that can occur anytime. The chest tightness will resolve. She is unsure if the tightness is b/c she is nervous about her high BP readings. She takes no PRN meds for chest pain. She has had a CABG  She has occasional shortness of breath - nothing unusual for her.   Advised will route to DOD & Dr. Oval Linsey to review her concerns. If needed, there are openings with Arnold Long NP tomorrow

## 2019-10-15 NOTE — Progress Notes (Signed)
Cardiology Office Note   Date:  10/16/2019   ID:  Melissa Gonzales, Melissa Gonzales 1941/10/21, MRN [id number]  PCP:  Melissa Infante, MD  Cardiologist: Dr. Oval Gonzales CC: Chest Pain    History of Present Illness: Melissa Gonzales is a 78 y.o. female who presents for ongoing assessment and management of coronary artery disease, history of CABG (LIMA to LAD, SVG to PDA, SVG to OM1), February 2020.  Other history includes poorly controlled hypertension.  Other history includes fatigue and depression, she is being treated with Lexapro and with Wellbutrin.  At the time of last office visit on 09/25/2019 she could not tell if she was feeling any better and continues to have depression symptoms.  The patient was not having any chest pain and she did exercise 5 days a week for up to 1 hour.  The patient called our office on 10/15/2019, complaining of elevated blood pressure of 188/100.  She felt tightness in her chest and lightheadedness.  She was concerned about her blood pressure being elevated.  Her medication regimen includes Coreg twice daily and HCTZ in the morning.  She stated that her chest tightness can occur at any time.  This causes increased anxiety.  She was advised to be seen today on follow-up for further evaluation.   Melissa Gonzales comes today with her blood pressure recordings.  They are ranging from 188/100 to as low as 128/75.  Majority of her blood pressure readings are elevated in the morning and have normalized by the afternoon into the 409W to 119J systolic.  Today as she did she comes into the office her blood pressure is 149/76.  I did recheck it after she sat with me for a while and it was 136/68.  She occasionally feels a discomfort in her chest with elevated heart rate which she describes as pressure.   Past Medical History:  Diagnosis Date  . Anxiety   . Arthritis   . Depression   . Diverticulosis   . GERD (gastroesophageal reflux disease)   . Glaucoma    Bilateral eyes  .  Hyperlipidemia   . Hypertension   . Peripheral neuropathy   . Pneumonia    hx of  . Urgency of urination     Past Surgical History:  Procedure Laterality Date  . BACK SURGERY    . CHOLECYSTECTOMY  1993  . COLONOSCOPY    . CORONARY ARTERY BYPASS GRAFT N/A 12/19/2018   Procedure: CORONARY ARTERY BYPASS GRAFTING (CABG)x3, using left internal mammary arteryand right and left greater saphenous veins harvested endoscopically;  Surgeon: Melissa Poot, MD;  Location: [facility];  Service: Open Heart Surgery;  Laterality: N/A;  . EYE SURGERY     Cataract Surgery Bilateral, 5 years ago  . LEFT HEART CATH AND CORONARY ANGIOGRAPHY N/A 12/17/2018   Procedure: LEFT HEART CATH AND CORONARY ANGIOGRAPHY;  Surgeon: Melissa Sine, MD;  Location: [facility];  Service: Cardiovascular;  Laterality: N/A;  . MAXIMUM ACCESS (MAS)POSTERIOR LUMBAR INTERBODY FUSION (PLIF) 1 LEVEL N/A 03/17/2015   Procedure: Lumbar five -sacral one Maximum access posterior lumbar interbody fusion/Lumbar two-three  Laminectomy and extension of instrumentation to [place];  Surgeon: Melissa Moore, MD;  Location: [facility];  Service: Neurosurgery;  Laterality: N/A;  . MAXIMUM ACCESS (MAS)POSTERIOR LUMBAR INTERBODY FUSION (PLIF) 2 LEVEL N/A 11/27/2014   Procedure: LUMBAR THREE-FOUR, LUMBAR FOUR-FIVE MAXIMUM ACCESS POSTERIOR LUMBAR INTERBODY FUSION;  Surgeon: Melissa Moore, MD;  Location: [facility];  Service: Neurosurgery;  Laterality: N/A;  L3-4 L4-5 MAXIMUM ACCESS POSTERIOR LUMBAR INTERBODY FUSION  . TEE WITHOUT CARDIOVERSION N/A 12/19/2018   Procedure: TRANSESOPHAGEAL ECHOCARDIOGRAM (TEE);  Surgeon: Melissa Gonzales, Melissa AristaPeter, MD;  [facility];  Service: Open Heart Surgery;  Laterality: N/A;  . VAGINAL HYSTERECTOMY  1970s   uterine prolapse     Current Outpatient Medications  Medication Sig Dispense Refill  . ALPHAGAN P 0.1 % SOLN Place 1 drop into both eyes every 12 (twelve) hours.     . ALPRAZolam (XANAX) 0.25 MG tablet  TAKE 1 TABLET BY MOUTH TWICE DAILY AS NEEDED FOR ANXIETY (Patient taking differently: Take 0.25 mg by mouth 2 (two) times daily as needed for anxiety. ) 30 tablet 0  . aspirin 81 MG chewable tablet Chew 81 mg by mouth daily.    Marland Kitchen. atorvastatin (LIPITOR) 80 MG tablet Take 80 mg by mouth daily.    Marland Kitchen. buPROPion (WELLBUTRIN XL) 150 MG 24 hr tablet Take 150 mg by mouth every morning.    . Calcium Carb-Cholecalciferol (CALCIUM 600 + D PO) Take 1 tablet by mouth daily.     . carvedilol (COREG) 6.25 MG tablet Take 1 tablet (6.25 mg total) by mouth 2 (two) times daily with a meal. 60 tablet 1  . dorzolamide-timolol (COSOPT) 22.3-6.8 MG/ML ophthalmic solution Place 1 drop into both eyes 2 (two) times daily.     Marland Kitchen. ELMIRON 100 MG capsule Take 100 mg by mouth 3 (three) times daily.    Marland Kitchen. escitalopram (LEXAPRO) 10 MG tablet Take 10 mg by mouth daily.    Marland Kitchen. esomeprazole (NEXIUM) 40 MG capsule Take 1 capsule (40 mg total) by mouth daily before breakfast. 90 capsule 3  . Fluocinolone Acetonide Scalp 0.01 % OIL     . hydrochlorothiazide (MICROZIDE) 12.5 MG capsule Take 1 capsule (12.5 mg total) by mouth daily. 90 capsule 3  . levothyroxine (SYNTHROID, LEVOTHROID) 25 MCG tablet Take 1 tablet (25 mcg total) by mouth daily at 6 (six) AM. 30 tablet 1  . loratadine (CLARITIN) 10 MG tablet Take 10 mg by mouth daily.    . Meth-Hyo-M Bl-Na Phos-Ph Sal (URIBEL) 118 MG CAPS Take 1 capsule by mouth 2 (two) times daily as needed (for pain).     . Multiple Vitamins-Minerals (MULTIVITAMIN WITH MINERALS) tablet Take 1 tablet by mouth daily.    . naproxen (NAPROSYN) 250 MG tablet Take 250 mg by mouth as needed.    . Netarsudil-Latanoprost 0.02-0.005 % SOLN Place 1 drop into both eyes at bedtime.    . nitrofurantoin (MACRODANTIN) 100 MG capsule Take 1 capsule by mouth at bedtime.    . triamcinolone cream (KENALOG) 0.1 %     . vitamin B-12 (CYANOCOBALAMIN) 100 MCG tablet Take 100 mcg by mouth daily.     No current  facility-administered medications for this visit.    Allergies:   Patient has no known allergies.    Social History:  The patient  reports that she has never smoked. She has never used smokeless tobacco. She reports current alcohol use of about 1.0 standard drinks of alcohol per week. She reports that she does not use drugs.   Family History:  The patient's family history includes Cancer in her father; Drug abuse in her grandchild; Heart disease (age of onset: 1886) in her mother; Hyperlipidemia in her sister; Hypertension in her sister; Mental illness in her daughter.    ROS: All other systems are reviewed and negative. Unless otherwise mentioned in H&P    PHYSICAL EXAM:  VS:  There were no vitals taken for this visit. , BMI There is no height or weight on file to calculate BMI. GEN: Well nourished, well developed, in no acute distress HEENT: normal Neck: no JVD, carotid bruits, or masses Cardiac: RRR; 1/6 systolic murmur heard best at the right sternal border, murmurs, rubs, or gallops,no edema  Respiratory:  Clear to auscultation bilaterally, normal work of breathing GI: soft, nontender, nondistended, + BS MS: no deformity or atrophy Skin: warm and dry, no rash Neuro:  Strength and sensation are intact Psych: euthymic mood, full affect   EKG: Normal sinus rhythm, heart rate of 66 bpm  Recent Labs: 12/14/2018: TSH 5.337 12/20/2018: Magnesium 2.2 12/24/2018: ALT 22 12/26/2018: BUN 7; Creatinine, Ser 0.54; Hemoglobin 8.2; Platelets 285; Potassium 3.6; Sodium 137    Lipid Panel    Component Value Date/Time   CHOL 177 12/15/2018 0259   TRIG 116 12/15/2018 0259   HDL 57 12/15/2018 0259   CHOLHDL 3.1 12/15/2018 0259   VLDL 23 12/15/2018 0259   LDLCALC 97 12/15/2018 0259   LDLDIRECT 118 (H) 06/02/2008 2046      Wt Readings from Last 3 Encounters:  09/25/19 154 lb 9.6 oz (70.1 kg)  05/22/19 150 lb 9.2 oz (68.3 kg)  03/12/19 150 lb (68 kg)      Other studies Reviewed:  Cardiac Cath 12/17/2018   Ost RCA to Mid RCA lesion is 100% stenosed.  Prox Cx lesion is 40% stenosed.  Mid Cx to Dist Cx lesion is 85% stenosed.  RPDA lesion is 95% stenosed.  Ost RPDA to RPDA lesion is 90% stenosed.  Dist LM to Ost LAD lesion is 35% stenosed.  Prox LAD lesion is 70% stenosed.  Prox LAD to Mid LAD lesion is 90% stenosed.   Severe extensive multivessel coronary calcification with significant three-vessel coronary obstructive disease.  The LAD has been 70% proximal stenoses and is a twin like vessel with 90% diffuse mid LAD stenosis.  The distal LAD does not reach the apex.  The circumflex vessel is very large caliber with extensive calcification with 40% proximal stenosis and 85% AV groove stenosis after the second marginal vessel and there is extensive collateralization to the right coronary artery via the circumflex vessel with 95 to 90% stenoses in the collateralized PDA vessel.  The RCA is extensively calcified and occluded at its ostium.  Echocardiogram 12/15/2018 1. The left ventricle has hyperdynamic systolic function of >65%. The cavity size was normal. There is mildly increased left ventricular wall thickness. Echo evidence of impaired diastolic relaxation Elevated left ventricular end-diastolic pressure The  E/e' is 15.6.  2. The right ventricle has normal systolic function. The cavity was normal. There is no increase in right ventricular wall thickness.  3. The mitral valve is normal in structure. There is mild mitral annular calcification present.  4. The tricuspid valve is normal in structure.Tricuspid valve regurgitation is mild by color flow Doppler.  5. The aortic valve is tricuspid Aortic valve regurgitation is moderate by color flow Doppler.  6. The pulmonic valve was normal in structure.  ASSESSMENT AND PLAN:  1.  Labile hypertension: Patient's blood pressure appears to be higher in the morning after taking her blood pressure, about 30 minutes after  taking her medications.  By the afternoon everything has normalized.  Evening blood pressure begins to rise slightly again.  I am going to increase her carvedilol to 9.125 mg in the p.m., and continue 6.25 mg in the a.m.  She continues  to wean off of Wellbutrin which caused her to have some hypertension.  She is going to continue to take her blood pressure as directed and record.  If she is noticing that her blood pressure has normalized in the morning once the Wellbutrin is out of her system, she will return to carvedilol 6.25 mg twice daily.  If she does not have any better response to medication, we may need to increase her carvedilol dose to a twice daily dosing of 9.125 mg.  It is also noted that she is on naproxen sodium for arthritis pain.  I did ask her to notice if her blood pressure is elevated after taking that medication if she does not take her medication for arthritis pain on a daily basis persistently.  Only as needed.  I have advised her that she may need to choose another medication for arthritis pain.,  Or we can possibly increase the carvedilol indefinitely.  2.  Coronary artery disease: History of coronary artery bypass grafting in February 2020 with LIMA to LAD, SVG to PDA, and SVG to OM1.  She currently does not have any exertional chest pain only some chest pressure when her blood pressure is elevated.  Continue aspirin 81 mg daily  3.  Hyperlipidemia: Goal of LDL in someone with CAD should be less than 70.  She will remain on atorvastatin 80 mg daily.  Last blood lipid check on 12/15/2018 revealed an LDL of 97.  On follow-up visit we will check fasting lipids and LFTs if not completed by primary care.  4.  Thyroid disease: The patient continues on levothyroxine daily.  She is to follow-up with PCP concerning labs and management.  5.  Depression: Has been on Wellbutrin but did not tolerate well as it was causing her blood pressure to increase.  She is weaning off of it at this time.   Her primary care physician is aware.  Current medicines are reviewed at length with the patient today.    Labs/ tests ordered today include: None  Melissa Gonzales, ANP, AACC   10/16/2019 11:44 AM    [facility] [address] Office [phone] Fax [phone]  Notice: This dictation was prepared with Dragon dictation along with smaller phrase technology. Any transcriptional errors that result from this process are unintentional and may not be corrected upon review.

## 2019-10-16 ENCOUNTER — Encounter: Payer: Self-pay | Admitting: Adult Health

## 2019-10-16 ENCOUNTER — Ambulatory Visit: Payer: Medicare Other | Admitting: Adult Health

## 2019-10-16 ENCOUNTER — Other Ambulatory Visit: Payer: Self-pay

## 2019-10-16 VITALS — BP 149/76 | HR 66 | Ht 63.0 in | Wt 154.4 lb

## 2019-10-16 DIAGNOSIS — I10 Essential (primary) hypertension: Secondary | ICD-10-CM

## 2019-10-16 DIAGNOSIS — I209 Angina pectoris, unspecified: Secondary | ICD-10-CM

## 2019-10-16 DIAGNOSIS — E785 Hyperlipidemia, unspecified: Secondary | ICD-10-CM | POA: Diagnosis not present

## 2019-10-16 DIAGNOSIS — I2510 Atherosclerotic heart disease of native coronary artery without angina pectoris: Secondary | ICD-10-CM | POA: Diagnosis not present

## 2019-10-16 DIAGNOSIS — I358 Other nonrheumatic aortic valve disorders: Secondary | ICD-10-CM

## 2019-10-16 MED ORDER — CARVEDILOL 6.25 MG PO TABS: 6.2500 mg | ORAL_TABLET | Freq: Two times a day (BID) | ORAL | 3 refills | Status: DC

## 2019-10-16 NOTE — Patient Instructions (Signed)
Medication Instructions:  TAKE Carvedilol 1 1/2 tablet by mouth a bedtime and 1 tablet in the morning  If you need a refill on your cardiac medications before your next appointment, please call your pharmacy.  Labwork: None ordered  Testing/Procedures: None Ordered   Follow-Up: Monday January 11th @ 2:45 pm  At [facility], you and your health needs are our priority.  As part of our continuing mission to provide you with exceptional heart care, we have created designated Provider Care Teams.  These Care Teams include your primary Cardiologist (physician) and Advanced Practice Providers (APPs -  Physician Assistants and Nurse Practitioners) who all work together to provide you with the care you need, when you need it.  Thank you for choosing [facility] at [facility]!!      Happy Holidays!!

## 2019-10-28 ENCOUNTER — Ambulatory Visit: Payer: Medicare Other

## 2019-10-28 ENCOUNTER — Other Ambulatory Visit: Payer: Self-pay

## 2019-10-28 ENCOUNTER — Ambulatory Visit (INDEPENDENT_AMBULATORY_CARE_PROVIDER_SITE_OTHER): Payer: Medicare Other | Admitting: Pharmacist Clinician (PhC)/ Clinical Pharmacy Specialist

## 2019-10-28 VITALS — BP 134/78 | HR 82 | Resp 13 | Ht 63.5 in | Wt 159.6 lb

## 2019-10-28 DIAGNOSIS — E785 Hyperlipidemia, unspecified: Secondary | ICD-10-CM

## 2019-10-28 MED ORDER — EZETIMIBE 10 MG PO TABS: 10.0000 mg | ORAL_TABLET | Freq: Every day | ORAL | 3 refills | Status: DC

## 2019-10-28 NOTE — Progress Notes (Signed)
Patient ID: Melissa Gonzales                 DOB: 11/09/1940                    MRN: [id number]     HPI: Melissa Gonzales is a 78 y.o. female patient referred to lipid clinic by Dr. Oval Linsey. PMH is significant for migraine headache, HTN, arthritis, anxiety, depression, insomnia, S/P CABG x3 (12/19/2018).  Of note, patient was initially admitted 12/2018 with NSTEMI.  She underwent LHC and was found to have 3 vessel CAD.  She underwent three-vessel CABG with Dr. Prescott Gum on 12/19/18.  Patient presents today for initial lipid appt. Patient is tolerating atorvastatin and is not experiencing myalgias. She is interested in learning about medication options to lower her LDL.    Patient specific reminder: patient was a Software engineer (retired now)  Current Medications: atorvastatin 80 mg daily Intolerances: none Risk Factors: ASCVD (NSTEMI, S/P CABG x3), HTN LDL goal: <70 mg/dL  Diet: 2 meals per day  -Brunch: leftovers from dinner, fruit, toast, egg -Dinner: chicken, pork chops, burger patties, vegetables, salads (eats salads every day), rice, potato, bread, butter, cheese -Snacks: none -Fast food 1-2x per month -Not a big sweets person  Exercise: walks 5 days/week for up to an hour (half hour at a time), pilates   Family History: father (cancer); mother (heart disease (age of onset: 8)); sister (HLD); sister (HTN); daughter (mental illness); grandchild (drug abuse); daughter (breast cancer)  Social History: never smoked/used smokeless tobacco, drinks 1.0 standard drinks of alcohol per day   Labs: 12/15/2018 TC 177 TG 116 HDL 57 VLDL 23 LDL 97; atorvastatin 80 mg daily 05/22/2014 TC 191 TG 127 HDL 63 VLDL 25 LDL 103; atorvastatin 40 mg daily 05/14/2013 TC 201 TG 104 HDL 57 VLDL 21 LDL 123; atorvastatin 40 mg daily  Past Medical History:  Diagnosis Date  . Anxiety   . Arthritis   . Depression   . Diverticulosis   . GERD (gastroesophageal reflux disease)   . Glaucoma    Bilateral eyes  .  Hyperlipidemia   . Hypertension   . Peripheral neuropathy   . Pneumonia    hx of  . Urgency of urination     Current Outpatient Medications on File Prior to Visit  Medication Sig Dispense Refill  . ALPHAGAN P 0.1 % SOLN Place 1 drop into both eyes every 12 (twelve) hours.     . ALPRAZolam (XANAX) 0.25 MG tablet TAKE 1 TABLET BY MOUTH TWICE DAILY AS NEEDED FOR ANXIETY (Patient taking differently: Take 0.25 mg by mouth 2 (two) times daily as needed for anxiety. ) 30 tablet 0  . aspirin 81 MG chewable tablet Chew 81 mg by mouth daily.    Marland Kitchen atorvastatin (LIPITOR) 80 MG tablet Take 80 mg by mouth daily.    . Calcium Carb-Cholecalciferol (CALCIUM 600 + D PO) Take 1 tablet by mouth daily.     . carvedilol (COREG) 6.25 MG tablet Take 1 tablet (6.25 mg total) by mouth 2 (two) times daily with a meal. 180 tablet 3  . dorzolamide-timolol (COSOPT) 22.3-6.8 MG/ML ophthalmic solution Place 1 drop into both eyes 2 (two) times daily.     Marland Kitchen ELMIRON 100 MG capsule Take 100 mg by mouth 3 (three) times daily.    Marland Kitchen escitalopram (LEXAPRO) 20 MG tablet Take 20 mg by mouth daily.     Marland Kitchen esomeprazole (NEXIUM) 40 MG capsule Take 1 capsule (  40 mg total) by mouth daily before breakfast. 90 capsule 3  . Fluocinolone Acetonide Scalp 0.01 % OIL     . hydrochlorothiazide (MICROZIDE) 12.5 MG capsule Take 1 capsule (12.5 mg total) by mouth daily. 90 capsule 3  . levothyroxine (SYNTHROID, LEVOTHROID) 25 MCG tablet Take 1 tablet (25 mcg total) by mouth daily at 6 (six) AM. 30 tablet 1  . loratadine (CLARITIN) 10 MG tablet Take 10 mg by mouth daily.    . Meth-Hyo-M Bl-Na Phos-Ph Sal (URIBEL) 118 MG CAPS Take 1 capsule by mouth 2 (two) times daily as needed (for pain).     . Multiple Vitamins-Minerals (MULTIVITAMIN WITH MINERALS) tablet Take 1 tablet by mouth daily.    . naproxen (NAPROSYN) 250 MG tablet Take 250 mg by mouth as needed.    . Netarsudil-Latanoprost 0.02-0.005 % SOLN Place 1 drop into both eyes at bedtime.    .  nitrofurantoin (MACRODANTIN) 100 MG capsule Take 1 capsule by mouth at bedtime.    . triamcinolone cream (KENALOG) 0.1 %     . vitamin B-12 (CYANOCOBALAMIN) 100 MCG tablet Take 100 mcg by mouth daily.     No current facility-administered medications on file prior to visit.    No Known Allergies  Assessment/Plan:  1. Hyperlipidemia - LDL goal < 70 mg/dL; therefore, patient is not at goal. Discussed additional options for patient - Zetia, Nexletol, Repatha, Praluent. Patient feels most comfortable at this time starting Zetia and will consider Repatha/Praluent in the future if LDL is not at goal after initiation of Zetia. Plan for patient to start Zetia 10 mg daily and continue atorvastatin 80 mg daily. Educated patient on mechanism of action, efficacy, administration, dosing, and side effects of Zetia. Plan for patient to get follow up lipid panel/LFT labs drawn on 01/26/2020. Patient verbalized understanding.   Future considerations: Repatha and Praluent are $47 for 30-day supply at preferred pharmacy and $131 for 90-day supply at mail order pharmacy via patient's insurance. Nexletol/Nexlizet are not covered via insurance at this time (10/28/2019).   Thank you for involving pharmacy to assist in providing Ms. Castille's care.   Zachery Conch, PharmD PGY2 Ambulatory Care Pharmacy Resident

## 2019-10-28 NOTE — Patient Instructions (Signed)
It was a pleasure seeing you in clinic today!  Today the plan is... 1. START Zetia 10 mg daily 2. Continue atorvastatin 80 mg daily 3. Come back on January 26, 2020 to get your cholesterol labs drawn. These are fasting labs so it is important to not eat/drink 8 hours before.  Please call the PharmD clinic at if you have any questions that you would like to speak with a pharmacist about

## 2019-10-29 ENCOUNTER — Encounter: Payer: Self-pay | Admitting: Pharmacist Clinician (PhC)/ Clinical Pharmacy Specialist

## 2019-11-12 ENCOUNTER — Telehealth: Payer: Self-pay | Admitting: Cardiovascular Disease

## 2019-11-12 NOTE — Progress Notes (Addendum)
Cardiology Clinic Note   Patient Name: Melissa Gonzales Date of Encounter: 11/13/2019  Primary Care Provider:  Rodrigo RanPerini, Mark, Gonzales Primary Cardiologist:  Melissa Siiffany Hinds, Gonzales  Patient Profile    Melissa Gonzales 79 year old female presents today for Gonzales evaluation of her elevated blood pressure.  Past Medical History    Past Medical History:  Diagnosis Date  . Anxiety   . Arthritis   . Depression   . Diverticulosis   . GERD (gastroesophageal reflux disease)   . Glaucoma    Bilateral eyes  . Hyperlipidemia   . Hypertension   . Peripheral neuropathy   . Pneumonia    hx of  . Urgency of urination    Past Surgical History:  Procedure Laterality Date  . BACK SURGERY    . CHOLECYSTECTOMY  1993  . COLONOSCOPY    . CORONARY ARTERY BYPASS GRAFT N/Gonzales 12/19/2018   Procedure: CORONARY ARTERY BYPASS GRAFTING (CABG)x3, using left internal mammary arteryand right and left greater saphenous veins harvested endoscopically;  Surgeon: Melissa PernaVan Gonzales, Peter, Gonzales;  [facility];  Service: Open Heart Surgery;  Laterality: N/Gonzales;  . EYE SURGERY     Cataract Surgery Bilateral, 5 years ago  . LEFT HEART CATH AND CORONARY ANGIOGRAPHY N/Gonzales 12/17/2018   Procedure: LEFT HEART CATH AND CORONARY ANGIOGRAPHY;  Surgeon: Melissa BihariKelly, Melissa Gonzales, Gonzales;  Location: [facility] INVASIVE CV LAB;  Service: Cardiovascular;  Laterality: N/Gonzales;  . MAXIMUM ACCESS (MAS)POSTERIOR LUMBAR INTERBODY FUSION (PLIF) 1 LEVEL N/Gonzales 03/17/2015   Procedure: Lumbar five -sacral one Maximum access posterior lumbar interbody fusion/Lumbar two-three  Laminectomy and extension of instrumentation to Sacral-one;  Surgeon: Melissa Gonzales;  Location: [facility] NEURO ORS;  Service: Neurosurgery;  Laterality: N/Gonzales;  . MAXIMUM ACCESS (MAS)POSTERIOR LUMBAR INTERBODY FUSION (PLIF) 2 LEVEL N/Gonzales 11/27/2014   Procedure: LUMBAR THREE-FOUR, LUMBAR FOUR-FIVE MAXIMUM ACCESS POSTERIOR LUMBAR INTERBODY FUSION;  Surgeon: Melissa Gonzales;  Location: [facility] NEURO ORS;  Service: Neurosurgery;   Laterality: N/Gonzales;  L3-4 L4-5 MAXIMUM ACCESS POSTERIOR LUMBAR INTERBODY FUSION  . TEE WITHOUT CARDIOVERSION N/Gonzales 12/19/2018   Procedure: TRANSESOPHAGEAL ECHOCARDIOGRAM (TEE);  Surgeon: Melissa Gonzales, Melissa AristaPeter, Gonzales;  [facility];  Service: Open Heart Surgery;  Laterality: N/Gonzales;  . VAGINAL HYSTERECTOMY  1970s   uterine prolapse    Allergies  No Known Allergies  History of Present Illness    Ms. Melissa Gonzales has Gonzales past medical history of migraine headache, essential hypertension, community-acquired pneumonia, parapneumonic effusion, neuropathy, anxiety, depressive disorder, insomnia, palpitations, dyslipidemia, and CABG x3 February 2020 (LIMA to LAD, SVG to PDA, SVG to OM1).  She called the cardiology clinic on 10/15/2019, stating that she had elevated blood pressures in the 180s over 100s.  This produced Gonzales feeling of tightness in her chest and lightheadedness.  This was increase in her anxiety.  She was seen by Melissa ReiningKathryn Lawrence, DNP on 10/16/2019.  It was discovered that her hypertension most commonly happened in the morning and would normalize during the afternoon his blood pressures in the 140s-130 systolic.  On presentation her blood pressure was 149/76 and on recheck it was 136/68.  She noted that she felt discomfort in her chest with elevated heart rate and she described the discomfort as Gonzales pressure sensation.  Her carvedilol was increased to 9.375 in the p.m. and her 6.25 mg Gonzales.m. dose was continued.  She presents today for follow-up and states she has had Gonzales significant amount of increased stress with her grandson being incarcerated due to EtOH and her  sister needing surgery and asking for her help.  She does not feel like she can provide assistance/help to her sister at this time and her grandson has stopped communicating with her.  She is seen today psychologist that is tied in with Melissa Gonzales.  She states that Melissa Gonzales is reluctant at this time to initiate Gonzales new antidepressant.  Initially after her  increased dosing of carvedilol there was Gonzales slight decrease in her blood pressure.  However, she has remained hypertensive.  She has had no more labile blood pressures.  She also notices that she has increased fatigue.  She is Gonzales retired Teacher, early years/pre.  I believe this is related to her mental health and inactivity.  I would not increase carvedilol at this time but will add lisinopril 10 and have her complete Gonzales follow-up BMP.  She denies chest pain, shortness of breath, lower extremity edema,  palpitations, melena, hematuria, hemoptysis, diaphoresis, weakness, presyncope, syncope, orthopnea, and PND.   Home Medications    Prior to Admission medications   Medication Sig Start Date End Date Taking? Authorizing Provider  ALPHAGAN P 0.1 % SOLN Place 1 drop into both eyes every 12 (twelve) hours.  04/05/12   Provider, Historical, Gonzales  ALPRAZolam (XANAX) 0.25 MG tablet TAKE 1 TABLET BY MOUTH TWICE DAILY AS NEEDED FOR ANXIETY Patient taking differently: Take 0.25 mg by mouth 2 (two) times daily as needed for anxiety.  05/05/15   Gonzales, Melissa Havens, DO  aspirin 81 MG chewable tablet Chew 81 mg by mouth daily.    Provider, Historical, Gonzales  atorvastatin (LIPITOR) 80 MG tablet Take 80 mg by mouth daily.    Provider, Historical, Gonzales  Calcium Carb-Cholecalciferol (CALCIUM 600 + D PO) Take 1 tablet by mouth daily.     Provider, Historical, Gonzales  carvedilol (COREG) 6.25 MG tablet Take 6.25 mg 1 tablet in am and 6.25 mg 1&1/2 tablets in pm 11/12/19   Melissa Si, Gonzales  dorzolamide-timolol (COSOPT) 22.3-6.8 MG/ML ophthalmic solution Place 1 drop into both eyes 2 (two) times daily.  03/20/11   Provider, Historical, Gonzales  ELMIRON 100 MG capsule Take 100 mg by mouth 3 (three) times daily. 11/19/18   Provider, Historical, Gonzales  escitalopram (LEXAPRO) 20 MG tablet Take 20 mg by mouth daily.     Provider, Historical, Gonzales  esomeprazole (NEXIUM) 40 MG capsule Take 1 capsule (40 mg total) by mouth daily before breakfast. 05/22/14   Gonzales,  Melissa Gonzales N, DO  ezetimibe (ZETIA) 10 MG tablet Take 1 tablet (10 mg total) by mouth daily. 10/28/19 01/26/20  Melissa Si, Gonzales  Fluocinolone Acetonide Scalp 0.01 % OIL  01/30/19   Provider, Historical, Gonzales  hydrochlorothiazide (MICROZIDE) 12.5 MG capsule Take 1 capsule (12.5 mg total) by mouth daily. 09/25/19 12/24/19  Melissa Si, Gonzales  levothyroxine (SYNTHROID, LEVOTHROID) 25 MCG tablet Take 1 tablet (25 mcg total) by mouth daily at 6 (six) AM. 12/29/18   Sharlene Dory, PA-C  loratadine (CLARITIN) 10 MG tablet Take 10 mg by mouth daily.    Provider, Historical, Gonzales  Meth-Hyo-M Bl-Na Phos-Ph Sal (URIBEL) 118 MG CAPS Take 1 capsule by mouth 2 (two) times daily as needed (for pain).  07/02/18   Provider, Historical, Gonzales  Multiple Vitamins-Minerals (MULTIVITAMIN WITH MINERALS) tablet Take 1 tablet by mouth daily.    Provider, Historical, Gonzales  naproxen (NAPROSYN) 250 MG tablet Take 250 mg by mouth as needed.    Provider, Historical, Gonzales  Netarsudil-Latanoprost 0.02-0.005 % SOLN Place 1 drop into both  eyes at bedtime. 11/18/18   Provider, Historical, Gonzales  nitrofurantoin (MACRODANTIN) 100 MG capsule Take 1 capsule by mouth at bedtime. 01/20/19   Provider, Historical, Gonzales  triamcinolone cream (KENALOG) 0.1 %  01/30/19   Provider, Historical, Gonzales  vitamin B-12 (CYANOCOBALAMIN) 100 MCG tablet Take 100 mcg by mouth daily.    Provider, Historical, Gonzales    Family History    Family History  Problem Relation Age of Onset  . Mental illness Daughter   . Drug abuse Grandchild   . Heart disease Mother 21       died CHF age 100  . Hyperlipidemia Sister   . Hypertension Sister   . Cancer Father        Primary Cell Liver Cancer  . Diabetes Neg Hx    She indicated that the status of her mother is unknown. She indicated that the status of her father is unknown. She indicated that the status of her sister is unknown. She indicated that the status of her daughter is unknown. She indicated that the status of her  grandchild is unknown. She indicated that the status of her neg hx is unknown.  Social History    Social History   Socioeconomic History  . Marital status: Widowed    Spouse name: Not on file  . Number of children: 3  . Years of education: Not on file  . Highest education level: Not on file  Occupational History    Employer: RETIRED  Tobacco Use  . Smoking status: Never Smoker  . Smokeless tobacco: Never Used  Substance and Sexual Activity  . Alcohol use: Yes    Alcohol/week: 1.0 standard drinks    Types: 1 Glasses of wine per week    Comment: 1-2 glass per night, sometimes none  . Drug use: No  . Sexual activity: Never  Other Topics Concern  . Not on file  Social History Narrative   Husband died suicide age 27s.  Currently in long term romantic relationship.  Support from 2 daughters and good relationship with grandchildren.  Other daughter has mental health issues, strained relationship.  Pt feels she is not making good decisions re: children and has tried to get CPS involved without relief.   1/3/13Lucila Maine (of daughter with mental health issues) facing jail time for selling drugs.  Pt has tried to support him and offered help with rehab or with changing colleges, but he refuses.     Social Determinants of Health   Financial Resource Strain:   . Difficulty of Paying Living Expenses: Not on file  Food Insecurity:   . Worried About Programme researcher, broadcasting/film/video in the Last Year: Not on file  . Ran Out of Food in the Last Year: Not on file  Transportation Needs:   . Lack of Transportation (Medical): Not on file  . Lack of Transportation (Non-Medical): Not on file  Physical Activity:   . Days of Exercise per Week: Not on file  . Minutes of Exercise per Session: Not on file  Stress:   . Feeling of Stress : Not on file  Social Connections:   . Frequency of Communication with Friends and Family: Not on file  . Frequency of Social Gatherings with Friends and Family: Not on file  .  Attends Religious Services: Not on file  . Active Member of Clubs or Organizations: Not on file  . Attends Banker Meetings: Not on file  . Marital Status: Not on file  Intimate Partner  Violence:   . Fear of Current or Ex-Partner: Not on file  . Emotionally Abused: Not on file  . Physically Abused: Not on file  . Sexually Abused: Not on file     Review of Systems    General:  No chills, fever, night sweats or weight changes.  Cardiovascular:  No chest pain, dyspnea on exertion, edema, orthopnea, palpitations, paroxysmal nocturnal dyspnea. Dermatological: No rash, lesions/masses Respiratory: No cough, dyspnea Urologic: No hematuria, dysuria Abdominal:   No nausea, vomiting, diarrhea, bright red blood per rectum, melena, or hematemesis Neurologic:  No visual changes, wkns, changes in mental status. All other systems reviewed and are otherwise negative except as noted above.  Physical Exam    VS:  BP (!) 162/75 (BP Location: Left Arm, Patient Position: Sitting, Cuff Size: Normal)   Pulse 79   Ht 5\' 3"  (1.6 m)   Wt 153 lb 6.4 oz (69.6 kg)   BMI 27.17 kg/m  , BMI Body mass index is 27.17 kg/m. GEN: Well nourished, well developed, in no acute distress. HEENT: normal. Neck: Supple, no JVD, carotid bruits, or masses. Cardiac: RRR, no murmurs, rubs, or gallops. No clubbing, cyanosis, edema.  Radials/DP/PT 2+ and equal bilaterally.  Respiratory:  Respirations regular and unlabored, clear to auscultation bilaterally. GI: Soft, nontender, nondistended, BS + x 4. MS: no deformity or atrophy. Skin: warm and dry, no rash. Neuro:  Strength and sensation are intact. Psych: Normal affect.  Accessory Clinical Findings    ECG personally reviewed by me today-none today- EKG 10/16/2019 Normal sinus rhythm 66 bpm   Cardiac Cath 12/17/2018   Ost RCA to Mid RCA lesion is 100% stenosed.  Prox Cx lesion is 40% stenosed.  Mid Cx to Dist Cx lesion is 85% stenosed.  RPDA lesion  is 95% stenosed.  Ost RPDA to RPDA lesion is 90% stenosed.  Dist LM to Ost LAD lesion is 35% stenosed.  Prox LAD lesion is 70% stenosed.  Prox LAD to Mid LAD lesion is 90% stenosed.  Severe extensive multivessel coronary calcification with significant three-vessel coronary obstructive disease. The LAD has been 70% proximal stenoses and is Gonzales twin like vessel with 90% diffuse mid LAD stenosis. The distal LAD does not reach the apex. The circumflex vessel is very large caliber with extensive calcification with 40% proximal stenosis and 85% AV groove stenosis after the second marginal vessel and there is extensive collateralization to the right coronary artery via the circumflex vessel with 95 to 90% stenoses in the collateralized PDA vessel. The RCA is extensively calcified and occluded at its ostium.  Echocardiogram 12/15/2018 1. The left ventricle has hyperdynamic systolic function of >35%. The cavity size was normal. There is mildly increased left ventricular wall thickness. Echo evidence of impaired diastolic relaxation Elevated left ventricular end-diastolic pressure The  E/e' is 15.6. 2. The right ventricle has normal systolic function. The cavity was normal. There is no increase in right ventricular wall thickness. 3. The mitral valve is normal in structure. There is mild mitral annular calcification present. 4. The tricuspid valve is normal in structure.Tricuspid valve regurgitation is mild by color flow Doppler. 5. The aortic valve is tricuspid Aortic valve regurgitation is moderate by color flow Doppler. 6. The pulmonic valve was normal in structure.  Assessment & Plan   1.  Essential hypertension-BP today 162/75.  Better controlled at home but still elevated 140s-150s over 70s-80s.  Patient is going to try al anon virtual support group. Continue current carvedilol dosing 6.25 AM and  9.375 PM Continue HCTZ 12.5 mg daily Heart healthy low-sodium diet-salty 6 given Increase  physical activity as tolerated Continue to avoid naproxen sodium and only take as needed Order lisinopril 10 mg daily-taken previously with good response Mindfulness stress reduction sheet given Repeat BMP prior to follow-up visit.  Coronary artery disease-status post CABG x 09 December 2018.  No chest pain today Continue atorvastatin 80 mg daily Continue 81 mg aspirin daily Heart healthy low-sodium diet Increase physical activity as tolerated-start with walking 15 minutes Gonzales day and continue your Pilates  Hyperlipidemia-LDL 97 12/15/2018.  Goal 70 or less. Continue atorvastatin 80 mg tablet daily Continue ezetimibe 10 mg tablet daily-recently added by clinic pharmacist. Recommend follow-up lipid profile in 2 months  Disposition: Follow-up with Melissa Reining, DNP 12/03/2019.  Thomasene Ripple. Mahasin Riviere NP-C      [facility] [address] Office [phone] Fax [phone]

## 2019-11-12 NOTE — Telephone Encounter (Signed)
Pt c/o BP issue: STAT if pt c/o blurred vision, one-sided weakness or slurred speech  1. What are your last 5 BP readings?  11/11/19:  175/85 then took carvedilol and it was 137/74  11/12/19: 180/100 HR 77 then about an hour ago 165/83 HR 71  2. Are you having any other symptoms (ex. Dizziness, headache, blurred vision, passed out)? No, denies any symptoms.   3. What is your BP issue? Elevated BP, patient denies any symptoms. She wanted an appt this week with Dr. Duke Salvia or an APP but did not see anything. She was wondering if she should take another carvedilol.

## 2019-11-12 NOTE — Telephone Encounter (Signed)
Returned call to patient.Stated her B/P has been elevated today this morning 185/100,165/80 pulse 71.Stated she is alittle sob and anxious.Stated PCP increased carvedilol 6.25 mg 1 tablet am and 1&1/2 tablet in pm back in December.She will take a extra 1/2 tablet now.Appointment scheduled with Edd Fabian NP 1/7 at 8:45 am.Advised to bring a list of B/P readings and all medications to appointment.

## 2019-11-13 ENCOUNTER — Encounter: Payer: Self-pay | Admitting: General Practice

## 2019-11-13 ENCOUNTER — Ambulatory Visit: Payer: Medicare Other | Admitting: General Practice

## 2019-11-13 ENCOUNTER — Other Ambulatory Visit: Payer: Self-pay

## 2019-11-13 VITALS — BP 162/75 | HR 79 | Ht 63.0 in | Wt 153.4 lb

## 2019-11-13 DIAGNOSIS — Z79899 Other long term (current) drug therapy: Secondary | ICD-10-CM | POA: Diagnosis not present

## 2019-11-13 DIAGNOSIS — I10 Essential (primary) hypertension: Secondary | ICD-10-CM

## 2019-11-13 DIAGNOSIS — E785 Hyperlipidemia, unspecified: Secondary | ICD-10-CM

## 2019-11-13 DIAGNOSIS — I2510 Atherosclerotic heart disease of native coronary artery without angina pectoris: Secondary | ICD-10-CM | POA: Diagnosis not present

## 2019-11-13 DIAGNOSIS — Z951 Presence of aortocoronary bypass graft: Secondary | ICD-10-CM

## 2019-11-13 MED ORDER — CARVEDILOL 3.125 MG PO TABS: 3.1250 mg | ORAL_TABLET | Freq: Every evening | ORAL | 3 refills | Status: DC

## 2019-11-13 MED ORDER — LISINOPRIL 10 MG PO TABS: 10.0000 mg | ORAL_TABLET | Freq: Every day | ORAL | 3 refills | Status: DC

## 2019-11-13 NOTE — Addendum Note (Signed)
Addended by: Alyson Ingles on: 11/13/2019 10:06 AM   Modules accepted: Orders

## 2019-11-13 NOTE — Patient Instructions (Signed)
Medication Instructions:  ADD LISINOPRIL  If you need a refill on your cardiac medications before your next appointment, please call your pharmacy.  Labwork: BMET 1-24-BEFORE YOUR FOLLOW UP APPOINTMENT HERE IN OUR OFFICE AT [facility]    You will NOT need to fast  If you have labs (blood work) drawn today and your tests are completely normal, you will receive your results only by: Marland Kitchen MyChart Message (if you have MyChart) OR . A paper copy in the mail If you have any lab test that is abnormal or we need to change your treatment, we will call you to review the results.  Special Instructions: CONTINUE TO TAKE AND LOG YOUR BLOOD PRESSURE AND BRING LOG TO YOUR FOLLOW UP APPOINTMENT  PLEASE READ AND FOLLOW SALTY 6 ATTACHED  PLEASE READ ATTACHED MINDFULNESS HANDOUT  Reduce your risk of getting COVID-19 With your heart disease it is especially important for people at increased risk of severe illness from COVID-19, and those who live with them, to protect themselves from getting COVID-19. The best way to protect yourself and to help reduce the spread of the virus that causes COVID-19 is to: Marland Kitchen Limit your interactions with other people as much as possible. . Take precautions to prevent getting COVID-19 when you do interact with others. If you start feeling sick and think you may have COVID-19, get in touch with your healthcare provider within 24 hours.  Follow-Up: KEEP SCHEDULED FOLLOW UP APPOINTMENT WITH KATHRYN, DNP In Person You may see Skeet Latch, MD or one of the following Advanced Practice Providers on your designated Care Team:  Kerin Ransom, PA-C  [place], [place]  Coletta Memos, [place].    At [facility], you and your health needs are our priority.  As part of our continuing mission to provide you with exceptional heart care, we have created designated Provider Care Teams.  These Care Teams include your primary Cardiologist (physician) and Advanced Practice Providers (APPs -   Physician Assistants and Nurse Practitioners) who all work together to provide you with the care you need, when you need it.  Thank you for choosing [facility] at [facility]!!        Mindfulness-Based Stress Reduction Mindfulness-based stress reduction (MBSR) is a program that helps people learn to practice mindfulness. Mindfulness is the practice of intentionally paying attention to the present moment. It can be learned and practiced through techniques such as education, breathing exercises, meditation, and yoga. MBSR includes several mindfulness techniques in one program. MBSR works best when you understand the treatment, are willing to try new things, and can commit to spending time practicing what you learn. MBSR training may include learning about:  How your emotions, thoughts, and reactions affect your body.  New ways to respond to things that cause negative thoughts to start (triggers).  How to notice your thoughts and let go of them.  Practicing awareness of everyday things that you normally do without thinking.  The techniques and goals of different types of meditation. What are the benefits of MBSR? MBSR can have many benefits, which include helping you to:  Develop self-awareness. This refers to knowing and understanding yourself.  Learn skills and attitudes that help you to participate in your own health care.  Learn new ways to care for yourself.  Be more accepting about how things are, and let things go.  Be less judgmental and approach things with an open mind.  Be patient with yourself and trust yourself more. MBSR has also been shown  to:  Reduce negative emotions, such as depression and anxiety.  Improve memory and focus.  Change how you sense and approach pain.  Boost your body's ability to fight infections.  Help you connect better with other people.  Improve your sense of well-being. Follow these instructions at home:   Find a local in-person  or online MBSR program.  Set aside some time regularly for mindfulness practice.  Find a mindfulness practice that works best for you. This may include one or more of the following: ? Meditation. Meditation involves focusing your mind on a certain thought or activity. ? Breathing awareness exercises. These help you to stay present by focusing on your breath. ? Body scan. For this practice, you lie down and pay attention to each part of your body from head to toe. You can identify tension and soreness and intentionally relax parts of your body. ? Yoga. Yoga involves stretching and breathing, and it can improve your ability to move and be flexible. It can also provide an experience of testing your body's limits, which can help you release stress. ? Mindful eating. This way of eating involves focusing on the taste, texture, color, and smell of each bite of food. Because this slows down eating and helps you feel full sooner, it can be an important part of a weight-loss plan.  Find a podcast or recording that provides guidance for breathing awareness, body scan, or meditation exercises. You can listen to these any time when you have a free moment to rest without distractions.  Follow your treatment plan as told by your health care provider. This may include taking regular medicines and making changes to your diet or lifestyle as recommended. How to practice mindfulness To do a basic awareness exercise:  Find a comfortable place to sit.  Pay attention to the present moment. Observe your thoughts, feelings, and surroundings just as they are.  Avoid placing judgment on yourself, your feelings, or your surroundings. Make note of any judgment that comes up, and let it go.  Your mind may wander, and that is okay. Make note of when your thoughts drift, and return your attention to the present moment. To do basic mindfulness meditation:  Find a comfortable place to sit. This may include a stable chair  or a firm floor cushion. ? Sit upright with your back straight. Let your arms fall next to your side with your hands resting on your legs. ? If sitting in a chair, rest your feet flat on the floor. ? If sitting on a cushion, cross your legs in front of you.  Keep your head in a neutral position with your chin dropped slightly. Relax your jaw and rest the tip of your tongue on the roof of your mouth. Drop your gaze to the floor. You can close your eyes if you like.  Breathe normally and pay attention to your breath. Feel the air moving in and out of your nose. Feel your belly expanding and relaxing with each breath.  Your mind may wander, and that is okay. Make note of when your thoughts drift, and return your attention to your breath.  Avoid placing judgment on yourself, your feelings, or your surroundings. Make note of any judgment or feelings that come up, let them go, and bring your attention back to your breath.  When you are ready, lift your gaze or open your eyes. Pay attention to how your body feels after the meditation. Where to find more information You can find  more information about MBSR from:  Your health care provider.  Community-based meditation centers or programs.  Programs offered near you. Summary  Mindfulness-based stress reduction (MBSR) is a program that teaches you how to intentionally pay attention to the present moment. It is used with other treatments to help you cope better with daily stress, emotions, and pain.  MBSR focuses on developing self-awareness, which allows you to respond to life stress without judgment or negative emotions.  MBSR programs may involve learning different mindfulness practices, such as breathing exercises, meditation, yoga, body scan, or mindful eating. Find a mindfulness practice that works best for you, and set aside time for it on a regular basis. This information is not intended to replace advice given to you by your health care  provider. Make sure you discuss any questions you have with your health care provider. Document Revised: 10/05/2017 Document Reviewed: 03/01/2017 Elsevier Patient Education  2020 [employer].

## 2019-11-17 ENCOUNTER — Ambulatory Visit: Payer: Medicare Other | Admitting: Adult Health

## 2019-12-02 LAB — BASIC METABOLIC PANEL
BUN/Creatinine Ratio: 18 (ref 12–28)
BUN: 12 mg/dL (ref 8–27)
CO2: 26 mmol/L (ref 20–29)
Calcium: 9.3 mg/dL (ref 8.7–10.3)
Chloride: 94 mmol/L — ABNORMAL LOW (ref 96–106)
Creatinine, Ser: 0.65 mg/dL (ref 0.57–1.00)
GFR calc Af Amer: 98 mL/min/{1.73_m2} (ref >59)
GFR calc non Af Amer: 85 mL/min/{1.73_m2} (ref >59)
Glucose: 76 mg/dL (ref 65–99)
Potassium: 5.1 mmol/L (ref 3.5–5.2)
Sodium: 133 mmol/L — ABNORMAL LOW (ref 134–144)

## 2019-12-02 NOTE — Progress Notes (Signed)
Cardiology Office Note   Date:  12/03/2019   ID:  Melissa Gonzales, DOB Dec 28, 1940, MRN [id number]  PCP:  Rodrigo Ran, MD  Cardiologist:  Dr.Childress  CC: :BP Follow Up   History of Present Illness: Melissa Gonzales is a 79 y.o. female who presents for ongoing assessment and management of hypertension, CAD (history of CABG times 01/06/2019 with LIMA to LAD, SVG to PDA, and SVG to OM1) with other history to include hyperlipidemia, GERD, peripheral neuropathy, migraine headaches, arthritis, anxiety and depression.  She was seen last in the office by Edd Fabian, NP, after calling our office on 10/15/2019 reporting elevated blood pressures in the 180s over 100s.  She also had complaints of chest tightness.  She admitted to having a lot of family stress, with her grandson being incarcerated due to EtOH abuse and her sister needing surgery requesting financial assistance.  She also states that she is being followed by Dr. Waynard Edwards, psychologist who was reluctant to initiate a new antidepression therapy with her.  During the last office visit she was noted to be hypertensive with a blood pressure of 162/75.  She was continued on carvedilol 6.2 5 in the AM and 9.37 5 in the PM, continued on HCTZ 12.5 mg daily.  She was started on lisinopril 10 mg daily.  BMET was ordered to evaluate kidney function after returning to ACE inhibitor therapy.  She was also advised on stress reduction techniques and to increase her activities and exercise. BMET was reviewed with creatinine 0.65, Na 133.   Melissa Gonzales comes today feeling some better.  She has been seen by psychotherapist and been started on Abilify for her depression.  She has been on it about 12 days now and she is beginning to feel a difference in her mood and energy is.  The patient also has been keeping track of her blood pressure and I am seeing a downward trend running between 125/60-135/65.  She continues to be under a lot of stress in her personal life  which she does positive be tearful as she discusses it today in the office.  Past Medical History:  Diagnosis Date  . Anxiety   . Arthritis   . Depression   . Diverticulosis   . GERD (gastroesophageal reflux disease)   . Glaucoma    Bilateral eyes  . Hyperlipidemia   . Hypertension   . Peripheral neuropathy   . Pneumonia    hx of  . Urgency of urination     Past Surgical History:  Procedure Laterality Date  . BACK SURGERY    . CHOLECYSTECTOMY  1993  . COLONOSCOPY    . CORONARY ARTERY BYPASS GRAFT N/A 12/19/2018   Procedure: CORONARY ARTERY BYPASS GRAFTING (CABG)x3, using left internal mammary arteryand right and left greater saphenous veins harvested endoscopically;  Surgeon: Kerin Perna, MD;  Location: [facility];  Service: Open Heart Surgery;  Laterality: N/A;  . EYE SURGERY     Cataract Surgery Bilateral, 5 years ago  . LEFT HEART CATH AND CORONARY ANGIOGRAPHY N/A 12/17/2018   Procedure: LEFT HEART CATH AND CORONARY ANGIOGRAPHY;  Surgeon: Lennette Bihari, MD;  Location: [facility] INVASIVE CV LAB;  Service: Cardiovascular;  Laterality: N/A;  . MAXIMUM ACCESS (MAS)POSTERIOR LUMBAR INTERBODY FUSION (PLIF) 1 LEVEL N/A 03/17/2015   Procedure: Lumbar five -sacral one Maximum access posterior lumbar interbody fusion/Lumbar two-three  Laminectomy and extension of instrumentation to Sacral-one;  Surgeon: Tia Alert, MD;  Location: [facility] NEURO ORS;  Service: Neurosurgery;  Laterality: N/A;  . MAXIMUM ACCESS (MAS)POSTERIOR LUMBAR INTERBODY FUSION (PLIF) 2 LEVEL N/A 11/27/2014   Procedure: LUMBAR THREE-FOUR, LUMBAR FOUR-FIVE MAXIMUM ACCESS POSTERIOR LUMBAR INTERBODY FUSION;  Surgeon: Eustace Moore, MD;  Location: [place] NEURO ORS;  Service: Neurosurgery;  Laterality: N/A;  L3-4 L4-5 MAXIMUM ACCESS POSTERIOR LUMBAR INTERBODY FUSION  . TEE WITHOUT CARDIOVERSION N/A 12/19/2018   Procedure: TRANSESOPHAGEAL ECHOCARDIOGRAM (TEE);  Surgeon: Prescott Gum, Collier Salina, MD;  Location: [place];  Service: Open Heart Surgery;   Laterality: N/A;  . VAGINAL HYSTERECTOMY  1970s   uterine prolapse     Current Outpatient Medications  Medication Sig Dispense Refill  . ALPHAGAN P 0.1 % SOLN Place 1 drop into both eyes every 12 (twelve) hours.     . ALPRAZolam (XANAX) 0.25 MG tablet TAKE 1 TABLET BY MOUTH TWICE DAILY AS NEEDED FOR ANXIETY (Patient taking differently: Take 0.25 mg by mouth 2 (two) times daily as needed for anxiety. ) 30 tablet 0  . ARIPiprazole (ABILIFY) 2 MG tablet Take 1 tablet (2 mg total) by mouth daily.    Marland Kitchen aspirin 81 MG chewable tablet Chew 81 mg by mouth daily.    Marland Kitchen atorvastatin (LIPITOR) 80 MG tablet Take 80 mg by mouth daily.    . Calcium Carb-Cholecalciferol (CALCIUM 600 + D PO) Take 1 tablet by mouth daily.     . carvedilol (COREG) 3.125 MG tablet Take 1 tablet (3.125 mg total) by mouth every evening. With 6.25mg  (9.375 mg total) pt is unable to cut tab in 1/2 180 tablet 3  . carvedilol (COREG) 6.25 MG tablet Take 6.25 mg by mouth 2 (two) times daily. Take 6.25 mg twice daily in the PM with an extra 3.125mg  tab 180 tablet 3  . dorzolamide-timolol (COSOPT) 22.3-6.8 MG/ML ophthalmic solution Place 1 drop into both eyes 2 (two) times daily.     Marland Kitchen ELMIRON 100 MG capsule Take 100 mg by mouth 3 (three) times daily.    Marland Kitchen escitalopram (LEXAPRO) 20 MG tablet Take 20 mg by mouth daily.     Marland Kitchen esomeprazole (NEXIUM) 40 MG capsule Take 1 capsule (40 mg total) by mouth daily before breakfast. 90 capsule 3  . ezetimibe (ZETIA) 10 MG tablet Take 1 tablet (10 mg total) by mouth daily. 90 tablet 3  . Fluocinolone Acetonide Scalp 0.01 % OIL     . hydrochlorothiazide (MICROZIDE) 12.5 MG capsule Take 1 capsule (12.5 mg total) by mouth daily. 90 capsule 3  . levothyroxine (SYNTHROID, LEVOTHROID) 25 MCG tablet Take 1 tablet (25 mcg total) by mouth daily at 6 (six) AM. 30 tablet 1  . lisinopril (ZESTRIL) 10 MG tablet Take 1 tablet (10 mg total) by mouth daily. 30 tablet 3  . loratadine (CLARITIN) 10 MG tablet Take 10 mg  by mouth daily.    . Meth-Hyo-M Bl-Na Phos-Ph Sal (URIBEL) 118 MG CAPS Take 1 capsule by mouth 2 (two) times daily as needed (for pain).     . Multiple Vitamins-Minerals (MULTIVITAMIN WITH MINERALS) tablet Take 1 tablet by mouth daily.    . naproxen (NAPROSYN) 250 MG tablet Take 250 mg by mouth as needed.    . Netarsudil-Latanoprost 0.02-0.005 % SOLN Place 1 drop into both eyes at bedtime.    . nitrofurantoin (MACRODANTIN) 100 MG capsule Take 1 capsule by mouth at bedtime.    . triamcinolone cream (KENALOG) 0.1 %     . vitamin B-12 (CYANOCOBALAMIN) 100 MCG tablet Take 100 mcg by mouth daily.     No current  facility-administered medications for this visit.    Allergies:   Patient has no known allergies.    Social History:  The patient  reports that she has never smoked. She has never used smokeless tobacco. She reports current alcohol use of about 1.0 standard drinks of alcohol per week. She reports that she does not use drugs.   Family History:  The patient's family history includes Cancer in her father; Drug abuse in her grandchild; Heart disease (age of onset: 14) in her mother; Hyperlipidemia in her sister; Hypertension in her sister; Mental illness in her daughter.    ROS: All other systems are reviewed and negative. Unless otherwise mentioned in H&P    PHYSICAL EXAM: VS:  BP 140/76   Pulse 83   Ht 5\' 4"  (1.626 m)   Wt 155 lb (70.3 kg)   BMI 26.61 kg/m  , BMI Body mass index is 26.61 kg/m. GEN: Well nourished, well developed, in no acute distress HEENT: normal Neck: no JVD, carotid bruits, or masses Cardiac: RRR; no murmurs, rubs, or gallops,no edema  Respiratory:  Clear to auscultation bilaterally, normal work of breathing GI: soft, nontender, nondistended, + BS MS: no deformity or atrophy Skin: warm and dry, no rash Neuro:  Strength and sensation are intact Psych: euthymic mood, full affect, easily tearful   EKG: Not completed this office visit  Recent  Labs: 12/14/2018: TSH 5.337 12/20/2018: Magnesium 2.2 12/24/2018: ALT 22 12/26/2018: Hemoglobin 8.2; Platelets 285 12/01/2019: BUN 12; Creatinine, Ser 0.65; Potassium 5.1; Sodium 133    Lipid Panel    Component Value Date/Time   CHOL 177 12/15/2018 0259   TRIG 116 12/15/2018 0259   HDL 57 12/15/2018 0259   CHOLHDL 3.1 12/15/2018 0259   VLDL 23 12/15/2018 0259   LDLCALC 97 12/15/2018 0259   LDLDIRECT 118 (H) 06/02/2008 2046      Wt Readings from Last 3 Encounters:  12/03/19 155 lb (70.3 kg)  11/13/19 153 lb 6.4 oz (69.6 kg)  10/28/19 159 lb 9.6 oz (72.4 kg)      Other studies Reviewed: Cardiac Cath 12/17/2018  Ost RCA to Mid RCA lesion is 100% stenosed.  Prox Cx lesion is 40% stenosed.  Mid Cx to Dist Cx lesion is 85% stenosed.  RPDA lesion is 95% stenosed.  Ost RPDA to RPDA lesion is 90% stenosed.  Dist LM to Ost LAD lesion is 35% stenosed.  Prox LAD lesion is 70% stenosed.  Prox LAD to Mid LAD lesion is 90% stenosed.  Severe extensive multivessel coronary calcification with significant three-vessel coronary obstructive disease. The LAD has been 70% proximal stenoses and is a twin like vessel with 90% diffuse mid LAD stenosis. The distal LAD does not reach the apex. The circumflex vessel is very large caliber with extensive calcification with 40% proximal stenosis and 85% AV groove stenosis after the second marginal vessel and there is extensive collateralization to the right coronary artery via the circumflex vessel with 95 to 90% stenoses in the collateralized PDA vessel. The RCA is extensively calcified and occluded at its ostium.  Echocardiogram 12/15/2018 1. The left ventricle has hyperdynamic systolic function of >65%. The cavity size was normal. There is mildly increased left ventricular wall thickness. Echo evidence of impaired diastolic relaxation Elevated left ventricular end-diastolic pressure The  E/e' is 15.6. 2. The right ventricle has normal systolic  function. The cavity was normal. There is no increase in right ventricular wall thickness. 3. The mitral valve is normal in structure. There is mild  mitral annular calcification present. 4. The tricuspid valve is normal in structure.Tricuspid valve regurgitation is mild by color flow Doppler. 5. The aortic valve is tricuspid Aortic valve regurgitation is moderate by color flow Doppler. 6. The pulmonic valve was normal in structure.   ASSESSMENT AND PLAN:  1.  Hypertension: Well-controlled on current medication regimen with addition of lisinopril 10 mg daily.  I am seeing a downward trend in her blood pressure over the last 2 weeks therefore I do not wish to add any additional medications or increase dosing at this time.  She is to continue to take her blood pressure as directed.  I have reviewed labs as above.  2.  Coronary artery disease: History of CABG x3 on 01/06/2019 as described above.  She denies any chest tightness, dyspnea on exertion, or dizziness.  She has some chronic fatigue associated with her depression and lack of exercise.  She states that she is trying to be more active by walking in her home at least 15 minutes a day or outside.  Continue secondary prevention with statin therapy and blood pressure control.  3.  Hyperlipidemia: Continue atorvastatin 80 mg daily and Zetia, she will need fasting lipids and LFTs on follow-up visit with goal of LDL less than 70.  4.  Anxiety and depression: Being followed by psychotherapist with addition of Abilify to antianxiety, and antidepressant medications.  She appears to be gaining some benefit from this.  She is to follow-up with them for ongoing management along with psychological counseling.   Current medicines are reviewed at length with the patient today.    Labs/ tests ordered today include: None take Melissa Gonzales. Melissa Gonzales, ANP, AACC   12/03/2019 2:26 PM    [facility] [address] Office  [phone] Fax [phone]  Notice: This dictation was prepared with Dragon dictation along with smaller phrase technology. Any transcriptional errors that result from this process are unintentional and may not be corrected upon review.

## 2019-12-03 ENCOUNTER — Other Ambulatory Visit: Payer: Self-pay

## 2019-12-03 ENCOUNTER — Encounter: Payer: Self-pay | Admitting: Adult Health

## 2019-12-03 ENCOUNTER — Ambulatory Visit: Payer: Medicare Other | Admitting: Adult Health

## 2019-12-03 VITALS — BP 140/76 | HR 83 | Ht 64.0 in | Wt 155.0 lb

## 2019-12-03 DIAGNOSIS — F32A Depression, unspecified: Secondary | ICD-10-CM

## 2019-12-03 DIAGNOSIS — I2510 Atherosclerotic heart disease of native coronary artery without angina pectoris: Secondary | ICD-10-CM | POA: Diagnosis not present

## 2019-12-03 DIAGNOSIS — I10 Essential (primary) hypertension: Secondary | ICD-10-CM | POA: Diagnosis not present

## 2019-12-03 DIAGNOSIS — F418 Other specified anxiety disorders: Secondary | ICD-10-CM | POA: Diagnosis not present

## 2019-12-03 DIAGNOSIS — E7800 Pure hypercholesterolemia, unspecified: Secondary | ICD-10-CM

## 2019-12-03 DIAGNOSIS — F329 Major depressive disorder, single episode, unspecified: Secondary | ICD-10-CM

## 2019-12-03 NOTE — Patient Instructions (Addendum)
Medication Instructions:  Continue same medications *If you need a refill on your cardiac medications before your next appointment, please call your pharmacy*  Lab Work: None ordered   Testing/Procedures: None ordered  Follow-Up: At [facility], you and your health needs are our priority.  As part of our continuing mission to provide you with exceptional heart care, we have created designated Provider Care Teams.  These Care Teams include your primary Cardiologist (physician) and Advanced Practice Providers (APPs -  Physician Assistants and Nurse Practitioners) who all work together to provide you with the care you need, when you need it.  Your next appointment:  Thursday 01/29/20 at 10:20 am   The format for your next appointment:  Office   Provider:  Dr.Blades

## 2019-12-18 ENCOUNTER — Ambulatory Visit: Payer: Medicare Other | Attending: Internal Medicine

## 2019-12-18 DIAGNOSIS — Z23 Encounter for immunization: Secondary | ICD-10-CM | POA: Insufficient documentation

## 2019-12-18 NOTE — Progress Notes (Signed)
   Covid-19 Vaccination Clinic  Name:  Billijo Dilling    MRN: [id number] DOB: 06/20/1941  12/18/2019  Ms. Amodei was observed post Covid-19 immunization for 15 minutes without incidence. She was provided with Vaccine Information Sheet and instruction to access the V-Safe system.   Ms. Coury was instructed to call 911 with any severe reactions post vaccine: Marland Kitchen Difficulty breathing  . Swelling of your face and throat  . A fast heartbeat  . A bad rash all over your body  . Dizziness and weakness    Immunizations Administered    Name Date Dose VIS Date Route   Pfizer COVID-19 Vaccine 12/18/2019  8:23 AM 0.3 mL 10/17/2019 Intramuscular   Manufacturer: [employer], [employer]   Lot: [id number]   NDC: [address]-3

## 2020-01-10 ENCOUNTER — Ambulatory Visit: Payer: Medicare Other | Attending: Internal Medicine

## 2020-01-10 DIAGNOSIS — Z23 Encounter for immunization: Secondary | ICD-10-CM | POA: Insufficient documentation

## 2020-01-10 NOTE — Progress Notes (Signed)
   Covid-19 Vaccination Clinic  Name:  Melissa Gonzales    MRN: [id number] DOB: June 06, 1941  01/10/2020  Ms. Baranski was observed post Covid-19 immunization for 15 minutes without incident. She was provided with Vaccine Information Sheet and instruction to access the V-Safe system.   Ms. Weldin was instructed to call 911 with any severe reactions post vaccine: Marland Kitchen Difficulty breathing  . Swelling of face and throat  . A fast heartbeat  . A bad rash all over body  . Dizziness and weakness   Immunizations Administered    Name Date Dose VIS Date Route   Pfizer COVID-19 Vaccine 01/10/2020 10:21 AM 0.3 mL 10/17/2019 Intramuscular   Manufacturer: [employer], [employer]   Lot: [id number]   NDC: [address]-7

## 2020-01-28 ENCOUNTER — Other Ambulatory Visit: Payer: Self-pay

## 2020-01-28 DIAGNOSIS — E785 Hyperlipidemia, unspecified: Secondary | ICD-10-CM

## 2020-01-29 ENCOUNTER — Ambulatory Visit: Payer: Medicare Other | Admitting: Cardiovascular Disease

## 2020-01-29 ENCOUNTER — Other Ambulatory Visit: Payer: Self-pay

## 2020-01-29 ENCOUNTER — Encounter: Payer: Self-pay | Admitting: Cardiovascular Disease

## 2020-01-29 VITALS — BP 128/62 | HR 77 | Ht 64.0 in | Wt 155.0 lb

## 2020-01-29 DIAGNOSIS — E785 Hyperlipidemia, unspecified: Secondary | ICD-10-CM | POA: Diagnosis not present

## 2020-01-29 DIAGNOSIS — I10 Essential (primary) hypertension: Secondary | ICD-10-CM

## 2020-01-29 DIAGNOSIS — I872 Venous insufficiency (chronic) (peripheral): Secondary | ICD-10-CM | POA: Insufficient documentation

## 2020-01-29 DIAGNOSIS — Z951 Presence of aortocoronary bypass graft: Secondary | ICD-10-CM

## 2020-01-29 DIAGNOSIS — Z5181 Encounter for therapeutic drug level monitoring: Secondary | ICD-10-CM | POA: Diagnosis not present

## 2020-01-29 LAB — LIPID PANEL
Chol/HDL Ratio: 2.5 ratio (ref 0.0–4.4)
Cholesterol, Total: 132 mg/dL (ref 100–199)
HDL: 53 mg/dL (ref >39)
LDL Chol Calc (NIH): 63 mg/dL (ref 0–99)
Triglycerides: 80 mg/dL (ref 0–149)
VLDL Cholesterol Cal: 16 mg/dL (ref 5–40)

## 2020-01-29 LAB — HEPATIC FUNCTION PANEL
ALT: 41 IU/L — ABNORMAL HIGH (ref 0–32)
AST: 49 IU/L — ABNORMAL HIGH (ref 0–40)
Albumin: 4.5 g/dL (ref 3.7–4.7)
Alkaline Phosphatase: 88 IU/L (ref 39–117)
Bilirubin Total: 0.6 mg/dL (ref 0.0–1.2)
Bilirubin, Direct: 0.21 mg/dL (ref 0.00–0.40)
Total Protein: 6.9 g/dL (ref 6.0–8.5)

## 2020-01-29 MED ORDER — HYDROCHLOROTHIAZIDE 25 MG PO TABS: 25.0000 mg | ORAL_TABLET | Freq: Every day | ORAL | 3 refills | Status: DC

## 2020-01-29 MED ORDER — LISINOPRIL 20 MG PO TABS: 20.0000 mg | ORAL_TABLET | Freq: Two times a day (BID) | ORAL | 3 refills | Status: DC

## 2020-01-29 NOTE — Progress Notes (Signed)
Cardiology Office Note   Date:  01/29/2020   ID:  Romie, Keeble [birth year], MRN [id number]  PCP:  Rodrigo Ran, MD  Cardiologist:   Chilton Si, MD   No chief complaint on file.    History of Present Illness: Melissa Gonzales is a 79 y.o. female with CAD s/p CABG (LIMA-LAD, SVG-PDA, SVG-OM1) who presents for follow up.  She was initially admitted 12/2018 with NSTEMI.  She underwent LHC and was found to have 3 vessel CAD.  She underwent three-vessel CABG with Dr. Donata Clay on 12/19/18.  She required diuresis after discharge.  She was started on HCTZ 01/2019 due to poorly controlled hypertension. She saw Corine Shelter virtually on 03/2019 and complained of fatigue and depression.  She has been treated with Lexapro and recently added Wellbutrin.  She thinks that this has helped.   At her last appointment her blood pressure was poorly-controlled.  However she felt that it had been well controlled at home.  She followed up with Joni Reining, DNP and carvedilol was increased.  She then saw Edd Fabian and was started on lisinopril.  Her BP remained above goal.  She is a retired Teacher, early years/pre and continued titrating lisinopril to 20mg  bid.  Her BP has been mostly in the 120s-low 130s lately.  She continues to struggle with L>R LE edema since surgery.  She has no orthopnea or PND.  She also has neuropathy which makes it hard for her to walk and exercise.  She also has a recumbent bike and is trying to get more exercise in.  However she is limited by chronic back pain.  She completed both Covid vaccinations.  Past Medical History:  Diagnosis Date  . Anxiety   . Arthritis   . Depression   . Diverticulosis   . GERD (gastroesophageal reflux disease)   . Glaucoma    Bilateral eyes  . Hyperlipidemia   . Hypertension   . Peripheral neuropathy   . Pneumonia    hx of  . Urgency of urination     Past Surgical History:  Procedure Laterality Date  . BACK SURGERY    . CHOLECYSTECTOMY   1993  . COLONOSCOPY    . CORONARY ARTERY BYPASS GRAFT N/A 12/19/2018   Procedure: CORONARY ARTERY BYPASS GRAFTING (CABG)x3, using left internal mammary arteryand right and left greater saphenous veins harvested endoscopically;  Surgeon: 12/21/2018, MD;  Location: [facility];  Service: Open Heart Surgery;  Laterality: N/A;  . EYE SURGERY     Cataract Surgery Bilateral, 5 years ago  . LEFT HEART CATH AND CORONARY ANGIOGRAPHY N/A 12/17/2018   Procedure: LEFT HEART CATH AND CORONARY ANGIOGRAPHY;  Surgeon: 02/15/2019, MD;  Location: [facility] INVASIVE CV LAB;  Service: Cardiovascular;  Laterality: N/A;  . MAXIMUM ACCESS (MAS)POSTERIOR LUMBAR INTERBODY FUSION (PLIF) 1 LEVEL N/A 03/17/2015   Procedure: Lumbar five -sacral one Maximum access posterior lumbar interbody fusion/Lumbar two-three  Laminectomy and extension of instrumentation to Sacral-one;  Surgeon: 05/17/2015, MD;  Location: [facility] NEURO ORS;  Service: Neurosurgery;  Laterality: N/A;  . MAXIMUM ACCESS (MAS)POSTERIOR LUMBAR INTERBODY FUSION (PLIF) 2 LEVEL N/A 11/27/2014   Procedure: LUMBAR THREE-FOUR, LUMBAR FOUR-FIVE MAXIMUM ACCESS POSTERIOR LUMBAR INTERBODY FUSION;  Surgeon: 11/29/2014, MD;  Location: [facility] NEURO ORS;  Service: Neurosurgery;  Laterality: N/A;  L3-4 L4-5 MAXIMUM ACCESS POSTERIOR LUMBAR INTERBODY FUSION  . TEE WITHOUT CARDIOVERSION N/A 12/19/2018   Procedure: TRANSESOPHAGEAL ECHOCARDIOGRAM (TEE);  Surgeon: 12/21/2018, Donata Clay, MD;  Location: [facility] OR;  Service: Open Heart Surgery;  Laterality: N/A;  . VAGINAL HYSTERECTOMY  1970s   uterine prolapse     Current Outpatient Medications  Medication Sig Dispense Refill  . ALPHAGAN P 0.1 % SOLN Place 1 drop into both eyes every 12 (twelve) hours.     . ALPRAZolam (XANAX) 0.25 MG tablet TAKE 1 TABLET BY MOUTH TWICE DAILY AS NEEDED FOR ANXIETY (Patient taking differently: Take 0.25 mg by mouth 2 (two) times daily as needed for anxiety. ) 30 tablet 0  . ARIPiprazole (ABILIFY) 2 MG tablet Take 1  tablet (2 mg total) by mouth daily.    Marland Kitchen aspirin 81 MG chewable tablet Chew 81 mg by mouth daily.    Marland Kitchen atorvastatin (LIPITOR) 80 MG tablet Take 80 mg by mouth daily.    . Calcium Carb-Cholecalciferol (CALCIUM 600 + D PO) Take 1 tablet by mouth daily.     . carvedilol (COREG) 6.25 MG tablet Take 6.25 mg by mouth 2 (two) times daily. 180 tablet 3  . dorzolamide-timolol (COSOPT) 22.3-6.8 MG/ML ophthalmic solution Place 1 drop into both eyes 2 (two) times daily.     Marland Kitchen ELMIRON 100 MG capsule Take 100 mg by mouth 3 (three) times daily.    Marland Kitchen escitalopram (LEXAPRO) 20 MG tablet Take 20 mg by mouth daily.     Marland Kitchen esomeprazole (NEXIUM) 40 MG capsule Take 1 capsule (40 mg total) by mouth daily before breakfast. 90 capsule 3  . Fluocinolone Acetonide Scalp 0.01 % OIL     . levothyroxine (SYNTHROID, LEVOTHROID) 25 MCG tablet Take 1 tablet (25 mcg total) by mouth daily at 6 (six) AM. 30 tablet 1  . lisinopril (ZESTRIL) 20 MG tablet Take 1 tablet (20 mg total) by mouth in the morning and at bedtime. 180 tablet 3  . loratadine (CLARITIN) 10 MG tablet Take 10 mg by mouth daily.    . Meth-Hyo-M Bl-Na Phos-Ph Sal (URIBEL) 118 MG CAPS Take 1 capsule by mouth 2 (two) times daily as needed (for pain).     . Multiple Vitamins-Minerals (MULTIVITAMIN WITH MINERALS) tablet Take 1 tablet by mouth daily.    . naproxen (NAPROSYN) 250 MG tablet Take 250 mg by mouth as needed.    . Netarsudil-Latanoprost 0.02-0.005 % SOLN Place 1 drop into both eyes at bedtime.    . nitrofurantoin (MACRODANTIN) 100 MG capsule Take 1 capsule by mouth at bedtime.    . triamcinolone cream (KENALOG) 0.1 %     . vitamin B-12 (CYANOCOBALAMIN) 100 MCG tablet Take 100 mcg by mouth daily.    Marland Kitchen ezetimibe (ZETIA) 10 MG tablet Take 1 tablet (10 mg total) by mouth daily. 90 tablet 3  . hydrochlorothiazide (HYDRODIURIL) 25 MG tablet Take 1 tablet (25 mg total) by mouth daily. 90 tablet 3   No current facility-administered medications for this visit.     Allergies:   Patient has no known allergies.    Social History:  The patient  reports that she has never smoked. She has never used smokeless tobacco. She reports current alcohol use of about 1.0 standard drinks of alcohol per week. She reports that she does not use drugs.   Family History:  The patient's family history includes Cancer in her father; Drug abuse in her grandchild; Heart disease (age of onset: 23) in her mother; Hyperlipidemia in her sister; Hypertension in her sister; Mental illness in her daughter.    ROS:  Please see the history of present illness.   Otherwise,  review of systems are positive for none.   All other systems are reviewed and negative.    PHYSICAL EXAM: VS:  BP 128/62   Pulse 77   Ht 5\' 4"  (1.626 m)   Wt 155 lb (70.3 kg)   SpO2 95%   BMI 26.61 kg/m  , BMI Body mass index is 26.61 kg/m. GENERAL:  Well appearing HEENT:  Pupils equal round and reactive, fundi not visualized, oral mucosa unremarkable NECK:  No jugular venous distention, waveform within normal limits, carotid upstroke brisk and symmetric, no bruits LUNGS:  Clear to auscultation bilaterally HEART:  RRR.  PMI not displaced or sustained,S1 and S2 within normal limits, no S3, no S4, no clicks, no rubs, III/IV systolic murmurs ABD:  Flat, positive bowel sounds normal in frequency in pitch, no bruits, no rebound, no guarding, no midline pulsatile mass, no hepatomegaly, no splenomegaly EXT:  2 plus pulses throughout, 2+ LE edema to mid tibia L>R, no cyanosis no clubbing SKIN:  No rashes no nodules NEURO:  Cranial nerves II through XII grossly intact, motor grossly intact throughout PSYCH:  Cognitively intact, oriented to person place and time   EKG:  EKG is not ordered today. The ekg ordered today demonstrates sinus rhythm.  Rate 72 bpm.   LHC 12/17/18:  Ost RCA to Mid RCA lesion is 100% stenosed.  Prox Cx lesion is 40% stenosed.  Mid Cx to Dist Cx lesion is 85% stenosed.  RPDA lesion  is 95% stenosed.  Ost RPDA to RPDA lesion is 90% stenosed.  Dist LM to Ost LAD lesion is 35% stenosed.  Prox LAD lesion is 70% stenosed.  Prox LAD to Mid LAD lesion is 90% stenosed.   Echo 12/15/18:  IMPRESSIONS    1. The left ventricle has hyperdynamic systolic function of >65%. The cavity size was normal. There is mildly increased left ventricular wall thickness. Echo evidence of impaired diastolic relaxation Elevated left ventricular end-diastolic pressure The  E/e' is 15.6.  2. The right ventricle has normal systolic function. The cavity was normal. There is no increase in right ventricular wall thickness.  3. The mitral valve is normal in structure. There is mild mitral annular calcification present.  4. The tricuspid valve is normal in structure.Tricuspid valve regurgitation is mild by color flow Doppler.  5. The aortic valve is tricuspid Aortic valve regurgitation is moderate by color flow Doppler.  6. The pulmonic valve was normal in structure.  Recent Labs: 12/01/2019: BUN 12; Creatinine, Ser 0.65; Potassium 5.1; Sodium 133 01/28/2020: ALT 41    Lipid Panel    Component Value Date/Time   CHOL 132 01/28/2020 1200   TRIG 80 01/28/2020 1200   HDL 53 01/28/2020 1200   CHOLHDL 2.5 01/28/2020 1200   CHOLHDL 3.1 12/15/2018 0259   VLDL 23 12/15/2018 0259   LDLCALC 63 01/28/2020 1200   LDLDIRECT 118 (H) 06/02/2008 2046      Wt Readings from Last 3 Encounters:  01/29/20 155 lb (70.3 kg)  12/03/19 155 lb (70.3 kg)  11/13/19 153 lb 6.4 oz (69.6 kg)      ASSESSMENT AND PLAN:  # CAD s/p CABG: Stable without angina.  Continue aspirin, atorvastatin, carvedilol, and Zetia.  # Hyperlipidemia:   Continue atorvastatin and Zetia.   LDL was down to 63 when checked 01/2020.  # Essential hypertension: Blood pressure is better controlled.  Continue lisinopril 20 mg p.o. daily.  Given her lower extremity edema and the fact that her blood pressure is still slightly  above goal on  many days, we will increase hydrochlorothiazide to 25 mg daily.  Reduce carvedilol back to 6.25 mg twice daily.  Check a basic metabolic panel in a week.  # Venous insufficiency: Her edema started after her bypass surgery.  This is likely due to venous insufficiency.  She does better with compression stockings.  Recommended that she start back wearing them.  We will also increase hydrochlorothiazide as above.   Current medicines are reviewed at length with the patient today.  The patient does not have concerns regarding medicines.  The following changes have been made:  Increase lisinopril to 20mg  bid  Labs/ tests ordered today include:   Orders Placed This Encounter  Procedures  . Basic metabolic panel     Disposition:   FU with Melissa Luo C. , MD, [facility] in 6-8 weeks.    Signed, Quavon Keisling C. [facility], MD, [facility]  01/29/2020 1:01 PM    [facility]

## 2020-01-29 NOTE — Patient Instructions (Addendum)
Medication Instructions:  INCREASE YOUR HYDROCHLOROTHIAZIDE TO 25 MG DAILY   DECREASE CARVEDILOL TO 6.25 MG TWICE A DAY   *If you need a refill on your cardiac medications before your next appointment, please call your pharmacy*  Lab Work: BMET IN 1 WEEK  If you have labs (blood work) drawn today and your tests are completely normal, you will receive your results only by: Marland Kitchen MyChart Message (if you have MyChart) OR . A paper copy in the mail If you have any lab test that is abnormal or we need to change your treatment, we will call you to review the results.  Testing/Procedures: NONE   Follow-Up: At [facility], you and your health needs are our priority.  As part of our continuing mission to provide you with exceptional heart care, we have created designated Provider Care Teams.  These Care Teams include your primary Cardiologist (physician) and Advanced Practice Providers (APPs -  Physician Assistants and Nurse Practitioners) who all work together to provide you with the care you need, when you need it.  We recommend signing up for the patient portal called "MyChart".  Sign up information is provided on this After Visit Summary.  MyChart is used to connect with patients for Virtual Visits (Telemedicine).  Patients are able to view lab/test results, encounter notes, upcoming appointments, etc.  Non-urgent messages can be sent to your provider as well.   To learn more about what you can do with MyChart, go to [email or web address].    Your next appointment:   6-8 week(s)  The format for your next appointment:   In Person  Provider:   You may see Chilton Si, MD or one of the following Advanced Practice Providers on your designated Care Team:    Corine Shelter, PA-C  [place], [place]  Edd Fabian, [place]

## 2020-02-04 ENCOUNTER — Telehealth: Payer: Self-pay

## 2020-02-04 NOTE — Telephone Encounter (Signed)
Called patient left recent lab results on personal voice mail.Advised to stop atorvastatin.Scheduler will call back with pharmacist appointment.

## 2020-02-05 ENCOUNTER — Telehealth: Payer: Self-pay

## 2020-02-05 NOTE — Telephone Encounter (Addendum)
Received a call from patient.She stated she received message left yesterday with lab results.Stated she has stopped atorvastatin.Stated she has problems taking medications.Stated she will speak to our pharmacist about starting PCSK9 inhibitors.Advised scheduler will call back with appointment.

## 2020-02-12 LAB — BASIC METABOLIC PANEL
BUN/Creatinine Ratio: 25 (ref 12–28)
BUN: 16 mg/dL (ref 8–27)
CO2: 23 mmol/L (ref 20–29)
Calcium: 9.6 mg/dL (ref 8.7–10.3)
Chloride: 100 mmol/L (ref 96–106)
Creatinine, Ser: 0.64 mg/dL (ref 0.57–1.00)
GFR calc Af Amer: 98 mL/min/{1.73_m2} (ref >59)
GFR calc non Af Amer: 85 mL/min/{1.73_m2} (ref >59)
Glucose: 91 mg/dL (ref 65–99)
Potassium: 5.2 mmol/L (ref 3.5–5.2)
Sodium: 137 mmol/L (ref 134–144)

## 2020-02-23 ENCOUNTER — Telehealth: Payer: Self-pay | Admitting: Cardiovascular Disease

## 2020-02-23 NOTE — Telephone Encounter (Signed)
Patient states that at the end of March she was told to stop taking her cholesterol medication. She is concerned since she will be off her medication for over a month. PharmD's first available appt is on 5/13.  I have added her to the waitlist for a sooner appt. She would like to speak to a nurse.

## 2020-02-23 NOTE — Telephone Encounter (Signed)
Spoke with patient. Patient concerned she has been off of cholesterol medication since 02/05/20. Patient worried her cholesterol will be too high and wants to know what she should be doing until she is seen on 5/13 with the pharmacist for PCSK9 initiation.   Patient has been watching her diet and limiting high fat foods, cheese and red meats. Patient advised to continue to watch diet and will route to MD to see if anything else is suggested in the meantime.

## 2020-02-25 NOTE — Telephone Encounter (Signed)
Being off cholesterol medicine for this amount of time is not going to hurt.  Is important to allow her liver function to normalize.  Agree with diet and exercise for now.

## 2020-02-25 NOTE — Telephone Encounter (Signed)
Left message to call back  

## 2020-02-26 NOTE — Telephone Encounter (Signed)
Patient returning Melinda's call 

## 2020-02-26 NOTE — Telephone Encounter (Signed)
I spoke to the patient with Dr Leonides Sake recommendation.  She verbalized understanding.

## 2020-03-05 ENCOUNTER — Other Ambulatory Visit: Payer: Self-pay

## 2020-03-05 ENCOUNTER — Encounter: Payer: Self-pay | Admitting: Cardiovascular Disease

## 2020-03-05 ENCOUNTER — Ambulatory Visit: Payer: Medicare Other | Admitting: Cardiovascular Disease

## 2020-03-05 VITALS — BP 126/62 | HR 84 | Temp 97.7°F | Ht 64.0 in | Wt 155.0 lb

## 2020-03-05 DIAGNOSIS — R0602 Shortness of breath: Secondary | ICD-10-CM

## 2020-03-05 DIAGNOSIS — R7989 Other specified abnormal findings of blood chemistry: Secondary | ICD-10-CM

## 2020-03-05 DIAGNOSIS — Z5181 Encounter for therapeutic drug level monitoring: Secondary | ICD-10-CM | POA: Diagnosis not present

## 2020-03-05 DIAGNOSIS — Z951 Presence of aortocoronary bypass graft: Secondary | ICD-10-CM

## 2020-03-05 DIAGNOSIS — E785 Hyperlipidemia, unspecified: Secondary | ICD-10-CM | POA: Diagnosis not present

## 2020-03-05 DIAGNOSIS — I10 Essential (primary) hypertension: Secondary | ICD-10-CM | POA: Diagnosis not present

## 2020-03-05 MED ORDER — FUROSEMIDE 40 MG PO TABS: 40.0000 mg | ORAL_TABLET | Freq: Every day | ORAL | 3 refills | Status: DC

## 2020-03-05 MED ORDER — CARVEDILOL 25 MG PO TABS: 25.0000 mg | ORAL_TABLET | Freq: Two times a day (BID) | ORAL | 1 refills | Status: DC

## 2020-03-05 NOTE — Progress Notes (Signed)
Cardiology Office Note   Date:  03/05/2020   ID:  Melissa, Gonzales 07-03-1941, MRN [id number]  PCP:  Rodrigo Ran, MD  Cardiologist:   Chilton Si, MD   No chief complaint on file.    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  She was initially admitted 12/2018 with NSTEMI.  She underwent LHC and was found to have 3 vessel CAD.  She underwent three-vessel CABG with Dr. Donata Clay on 12/19/18.  She required diuresis after discharge.  She was started on HCTZ 01/2019 due to poorly controlled hypertension. She saw Corine Shelter virtually on 03/2019 and complained of fatigue and depression.  She has been treated with Lexapro and recently added Wellbutrin, which has helped.  Carvedilol was increased and she was started on lisinopril due to poorly controlled hypertension.  She continues to struggle with L>R LE edema since surgery.  She has no orthopnea or PND.  She also has neuropathy which makes it hard for her to walk and exercise.  She also has a recumbent bike and is trying to get more exercise in.  She would like to be able to walk more in order to build her bone density.  She also is limited by chronic back pain.  At her last appointment hydrochlorothiazide was increased.  Her blood pressure has improved.  There has not been much change in her edema.  She continues to deny orthopnea or PND.  She has no chest pain or pressure.  Since her last appointment her LFTs were mildly elevated.  Atorvastatin was discontinued.  She drinks 2 glasses of alcohol nightly and has Tylenol 2-4 times per week.   Past Medical History:  Diagnosis Date  . Anxiety   . Arthritis   . Depression   . Diverticulosis   . GERD (gastroesophageal reflux disease)   . Glaucoma    Bilateral eyes  . Hyperlipidemia   . Hypertension   . Peripheral neuropathy   . Pneumonia    hx of  . Urgency of  urination     Past Surgical History:  Procedure Laterality Date  . BACK SURGERY    . CHOLECYSTECTOMY  1993  . COLONOSCOPY    . CORONARY ARTERY BYPASS GRAFT N/A 12/19/2018   Procedure: CORONARY ARTERY BYPASS GRAFTING (CABG)x3, using left internal mammary arteryand right and left greater saphenous veins harvested endoscopically;  Surgeon: Kerin Perna, MD;  Location: [facility] OR;  Service: Open Heart Surgery;  Laterality: N/A;  . EYE SURGERY     Cataract Surgery Bilateral, 5 years ago  . LEFT HEART CATH AND CORONARY ANGIOGRAPHY N/A 12/17/2018   Procedure: LEFT HEART CATH AND CORONARY ANGIOGRAPHY;  Surgeon: Lennette Bihari, MD;  Location: [facility] INVASIVE CV LAB;  Service: Cardiovascular;  Laterality: N/A;  . MAXIMUM ACCESS (MAS)POSTERIOR LUMBAR INTERBODY FUSION (PLIF) 1 LEVEL N/A 03/17/2015   Procedure: Lumbar five -sacral one Maximum access posterior lumbar interbody fusion/Lumbar two-three  Laminectomy and extension of instrumentation to Sacral-one;  Surgeon: Tia Alert, MD;  Location: [facility] NEURO ORS;  Service: Neurosurgery;  Laterality: N/A;  . MAXIMUM ACCESS (MAS)POSTERIOR LUMBAR INTERBODY FUSION (PLIF) 2 LEVEL N/A 11/27/2014   Procedure: LUMBAR THREE-FOUR, LUMBAR FOUR-FIVE MAXIMUM ACCESS POSTERIOR LUMBAR INTERBODY FUSION;  Surgeon: Tia Alert, MD;  Location: [facility] NEURO ORS;  Service: Neurosurgery;  Laterality: N/A;  L3-4 L4-5 MAXIMUM ACCESS POSTERIOR LUMBAR INTERBODY FUSION  .  TEE WITHOUT CARDIOVERSION N/A 12/19/2018   Procedure: TRANSESOPHAGEAL ECHOCARDIOGRAM (TEE);  Surgeon: Prescott Gum, Collier Salina, MD;  Location: [place];  Service: Open Heart Surgery;  Laterality: N/A;  . VAGINAL HYSTERECTOMY  1970s   uterine prolapse     Current Outpatient Medications  Medication Sig Dispense Refill  . ALPHAGAN P 0.1 % SOLN Place 1 drop into both eyes every 12 (twelve) hours.     . ALPRAZolam (XANAX) 0.25 MG tablet TAKE 1 TABLET BY MOUTH TWICE DAILY AS NEEDED FOR ANXIETY (Patient taking differently: Take 0.25 mg by  mouth 2 (two) times daily as needed for anxiety. ) 30 tablet 0  . ARIPiprazole (ABILIFY) 2 MG tablet Take 1 tablet (2 mg total) by mouth daily.    Marland Kitchen aspirin 81 MG chewable tablet Chew 81 mg by mouth daily.    . Calcium Carb-Cholecalciferol (CALCIUM 600 + D PO) Take 1 tablet by mouth daily.     . carvedilol (COREG) 6.25 MG tablet Take 6.25 mg by mouth 2 (two) times daily. 180 tablet 3  . dorzolamide-timolol (COSOPT) 22.3-6.8 MG/ML ophthalmic solution Place 1 drop into both eyes 2 (two) times daily.     Marland Kitchen ELMIRON 100 MG capsule Take 100 mg by mouth 3 (three) times daily.    Marland Kitchen escitalopram (LEXAPRO) 20 MG tablet Take 20 mg by mouth daily.     Marland Kitchen esomeprazole (NEXIUM) 40 MG capsule Take 1 capsule (40 mg total) by mouth daily before breakfast. 90 capsule 3  . ezetimibe (ZETIA) 10 MG tablet Take 1 tablet (10 mg total) by mouth daily. 90 tablet 3  . Fluocinolone Acetonide Scalp 0.01 % OIL     . hydrochlorothiazide (HYDRODIURIL) 25 MG tablet Take 1 tablet (25 mg total) by mouth daily. 90 tablet 3  . levothyroxine (SYNTHROID, LEVOTHROID) 25 MCG tablet Take 1 tablet (25 mcg total) by mouth daily at 6 (six) AM. 30 tablet 1  . lisinopril (ZESTRIL) 20 MG tablet Take 1 tablet (20 mg total) by mouth in the morning and at bedtime. 180 tablet 3  . loratadine (CLARITIN) 10 MG tablet Take 10 mg by mouth daily.    . Meth-Hyo-M Bl-Na Phos-Ph Sal (URIBEL) 118 MG CAPS Take 1 capsule by mouth 2 (two) times daily as needed (for pain).     . Multiple Vitamins-Minerals (MULTIVITAMIN WITH MINERALS) tablet Take 1 tablet by mouth daily.    . naproxen (NAPROSYN) 250 MG tablet Take 250 mg by mouth as needed.    . Netarsudil-Latanoprost 0.02-0.005 % SOLN Place 1 drop into both eyes at bedtime.    . nitrofurantoin (MACRODANTIN) 100 MG capsule Take 1 capsule by mouth at bedtime.    . triamcinolone cream (KENALOG) 0.1 %     . vitamin B-12 (CYANOCOBALAMIN) 100 MCG tablet Take 100 mcg by mouth daily.     No current  facility-administered medications for this visit.    Allergies:   Patient has no known allergies.    Social History:  The patient  reports that she has never smoked. She has never used smokeless tobacco. She reports current alcohol use of about 1.0 standard drinks of alcohol per week. She reports that she does not use drugs.   Family History:  The patient's family history includes Cancer in her father; Drug abuse in her grandchild; Heart disease (age of onset: 54) in her mother; Hyperlipidemia in her sister; Hypertension in her sister; Mental illness in her daughter.    ROS:  Please see the history of present illness.  Otherwise, review of systems are positive for none.   All other systems are reviewed and negative.    PHYSICAL EXAM: VS:  BP 126/62 (BP Location: Left Arm, Patient Position: Sitting, Cuff Size: Normal)   Pulse 84   Temp 97.7 F (36.5 C)   Ht 5\' 4"  (1.626 m)   Wt 155 lb (70.3 kg)   BMI 26.61 kg/m  , BMI Body mass index is 26.61 kg/m. GENERAL:  Well appearing HEENT: Pupils equal round and reactive, fundi not visualized, oral mucosa unremarkable NECK:  No jugular venous distention, waveform within normal limits, carotid upstroke brisk and symmetric, no bruits LUNGS:  Clear to auscultation bilaterally HEART:  RRR.  PMI not displaced or sustained,S1 and S2 within normal limits, no S3, no S4, no clicks, no rubs, II/VI systolic murmur at the LUSB ABD:  Flat, positive bowel sounds normal in frequency in pitch, no bruits, no rebound, no guarding, no midline pulsatile mass, no hepatomegaly, no splenomegaly EXT:  2 plus pulses throughout, L>R LE edema, no cyanosis no clubbing SKIN:  No rashes no nodules NEURO:  Cranial nerves II through XII grossly intact, motor grossly intact throughout PSYCH:  Cognitively intact, oriented to person place and time   EKG:  EKG is not ordered today. The ekg ordered 09/25/19 demonstrates sinus rhythm.  Rate 72 bpm.   LHC 12/17/18:  Ost RCA  to Mid RCA lesion is 100% stenosed.  Prox Cx lesion is 40% stenosed.  Mid Cx to Dist Cx lesion is 85% stenosed.  RPDA lesion is 95% stenosed.  Ost RPDA to RPDA lesion is 90% stenosed.  Dist LM to Ost LAD lesion is 35% stenosed.  Prox LAD lesion is 70% stenosed.  Prox LAD to Mid LAD lesion is 90% stenosed.   Echo 12/15/18:  IMPRESSIONS    1. The left ventricle has hyperdynamic systolic function of >65%. The cavity size was normal. There is mildly increased left ventricular wall thickness. Echo evidence of impaired diastolic relaxation Elevated left ventricular end-diastolic pressure The  E/e' is 15.6.  2. The right ventricle has normal systolic function. The cavity was normal. There is no increase in right ventricular wall thickness.  3. The mitral valve is normal in structure. There is mild mitral annular calcification present.  4. The tricuspid valve is normal in structure.Tricuspid valve regurgitation is mild by color flow Doppler.  5. The aortic valve is tricuspid Aortic valve regurgitation is moderate by color flow Doppler.  6. The pulmonic valve was normal in structure.  Recent Labs: 01/28/2020: ALT 41 02/11/2020: BUN 16; Creatinine, Ser 0.64; Potassium 5.2; Sodium 137    Lipid Panel    Component Value Date/Time   CHOL 132 01/28/2020 1200   TRIG 80 01/28/2020 1200   HDL 53 01/28/2020 1200   CHOLHDL 2.5 01/28/2020 1200   CHOLHDL 3.1 12/15/2018 0259   VLDL 23 12/15/2018 0259   LDLCALC 63 01/28/2020 1200   LDLDIRECT 118 (H) 06/02/2008 2046      Wt Readings from Last 3 Encounters:  01/29/20 155 lb (70.3 kg)  12/03/19 155 lb (70.3 kg)  11/13/19 153 lb 6.4 oz (69.6 kg)      ASSESSMENT AND PLAN:  # CAD s/p CABG: Stable without angina.  Continue aspirin, atorvastatin, carvedilol, and Zetia.  # Hyperlipidemia:  LDL was down to 63 when checked 01/2020.  However atorvastatin was stopped due to elevated LFTs.  We will repeat her LFTs today.  If they have normalized  we will try starting rosuvastatin  at a lower dose.  We also discussed limiting Tylenol and alcohol use.  # Essential hypertension: Blood pressure is much better-controlled.  However she continues to struggle with lower extremity edema.  I suspect that this may be due to both neuropathy and venous insufficiency.  We will check a BNP today.  It does not seem as though heart failure is contributing to her symptoms.  Continue to use compression socks as tolerated.  We will switch HCTZ to furosemide.  Referral to [facility].  Continue carvedilol and lisinopril.  Check a BMP in 1 week.  # Venous insufficiency: Her edema started after her bypass surgery.  This is likely due to venous insufficiency.  She does better with compression stockings.  Switch HCTZ to furosemide as above.  Vein and vascular referral   Current medicines are reviewed at length with the patient today.  The patient does not have concerns regarding medicines.  The following changes have been made:  Increase lisinopril to 20mg  bid  Labs/ tests ordered today include:   No orders of the defined types were placed in this encounter.    Disposition:   FU with Melissa Gonzales C. , MD, [facility] in 6-8 weeks.    Signed, Liisa Picone C. [facility], MD, [facility]  03/05/2020 1:25 PM    [facility]

## 2020-03-05 NOTE — Patient Instructions (Addendum)
Medication Instructions:  INCREASE CARVEDILOL TO 25 MG TWICE A DAY   STOP HYDROCHLOROTHIAZIDE   START FUROSEMIDE 40 MG DAILY   *If you need a refill on your cardiac medications before your next appointment, please call your pharmacy*  Lab Work: CMET/BNP TODAY   BMET IN 1 WEEK   If you have labs (blood work) drawn today and your tests are completely normal, you will receive your results only by: Marland Kitchen MyChart Message (if you have MyChart) OR . A paper copy in the mail If you have any lab test that is abnormal or we need to change your treatment, we will call you to review the results.  Testing/Procedures: NONE  Follow-Up: At [facility], you and your health needs are our priority.  As part of our continuing mission to provide you with exceptional heart care, we have created designated Provider Care Teams.  These Care Teams include your primary Cardiologist (physician) and Advanced Practice Providers (APPs -  Physician Assistants and Nurse Practitioners) who all work together to provide you with the care you need, when you need it.  We recommend signing up for the patient portal called "MyChart".  Sign up information is provided on this After Visit Summary.  MyChart is used to connect with patients for Virtual Visits (Telemedicine).  Patients are able to view lab/test results, encounter notes, upcoming appointments, etc.  Non-urgent messages can be sent to your provider as well.   To learn more about what you can do with MyChart, go to [email or web address].    Your next appointment:   2 month(s)  The format for your next appointment:   In Person  Provider:   You may see Chilton Si, MD or one of the following Advanced Practice Providers on your designated Care Team:    Corine Shelter, PA-C  [place], [place]  Edd Fabian, [place]  YOU HAVE BEEN REFERRED TO [facility] IF YOU DO NOT HEAR FROM THEM IN 1 WEEK CALL THEM DIRECTLY AT [phone]

## 2020-03-06 LAB — COMPREHENSIVE METABOLIC PANEL
ALT: 48 IU/L — ABNORMAL HIGH (ref 0–32)
AST: 57 IU/L — ABNORMAL HIGH (ref 0–40)
Albumin/Globulin Ratio: 2.1 (ref 1.2–2.2)
Albumin: 4.5 g/dL (ref 3.7–4.7)
Alkaline Phosphatase: 87 IU/L (ref 39–117)
BUN/Creatinine Ratio: 23 (ref 12–28)
BUN: 20 mg/dL (ref 8–27)
Bilirubin Total: 0.4 mg/dL (ref 0.0–1.2)
CO2: 22 mmol/L (ref 20–29)
Calcium: 9.3 mg/dL (ref 8.7–10.3)
Chloride: 95 mmol/L — ABNORMAL LOW (ref 96–106)
Creatinine, Ser: 0.86 mg/dL (ref 0.57–1.00)
GFR calc Af Amer: 74 mL/min/{1.73_m2} (ref >59)
GFR calc non Af Amer: 64 mL/min/{1.73_m2} (ref >59)
Globulin, Total: 2.1 g/dL (ref 1.5–4.5)
Glucose: 94 mg/dL (ref 65–99)
Potassium: 4.5 mmol/L (ref 3.5–5.2)
Sodium: 133 mmol/L — ABNORMAL LOW (ref 134–144)
Total Protein: 6.6 g/dL (ref 6.0–8.5)

## 2020-03-06 LAB — BRAIN NATRIURETIC PEPTIDE: BNP: 44.8 pg/mL (ref 0.0–100.0)

## 2020-03-12 ENCOUNTER — Telehealth: Payer: Self-pay | Admitting: *Deleted

## 2020-03-12 DIAGNOSIS — R7989 Other specified abnormal findings of blood chemistry: Secondary | ICD-10-CM

## 2020-03-12 DIAGNOSIS — R945 Abnormal results of liver function studies: Secondary | ICD-10-CM

## 2020-03-12 NOTE — Telephone Encounter (Signed)
Advised patient of lab results and referral placed if GI

## 2020-03-12 NOTE — Telephone Encounter (Signed)
-----   Message from Chilton Si, MD sent at 03/10/2020  2:25 PM EDT ----- Kidney function is good.  Sodium is a little bit low.  This can be seen when people are dehydrated or volume overloaded.  BNP is normal, so heart failure is not the cause.  Okay to continue the Lasix for her extra fluid.  Liver function remains mildly elevated.  Recommend referral to GI for consideration of why her liver enzymes continue to rise.

## 2020-03-18 ENCOUNTER — Ambulatory Visit (INDEPENDENT_AMBULATORY_CARE_PROVIDER_SITE_OTHER): Payer: Medicare Other | Admitting: Pharmacist Clinician (PhC)/ Clinical Pharmacy Specialist

## 2020-03-18 ENCOUNTER — Other Ambulatory Visit: Payer: Self-pay

## 2020-03-18 VITALS — BP 126/60 | HR 68 | Temp 98.2°F | Ht 64.0 in | Wt 154.0 lb

## 2020-03-18 DIAGNOSIS — R945 Abnormal results of liver function studies: Secondary | ICD-10-CM | POA: Diagnosis not present

## 2020-03-18 DIAGNOSIS — R7989 Other specified abnormal findings of blood chemistry: Secondary | ICD-10-CM

## 2020-03-18 DIAGNOSIS — E785 Hyperlipidemia, unspecified: Secondary | ICD-10-CM | POA: Diagnosis not present

## 2020-03-18 DIAGNOSIS — I10 Essential (primary) hypertension: Secondary | ICD-10-CM | POA: Diagnosis not present

## 2020-03-18 NOTE — Progress Notes (Signed)
03/19/2020 Melissa Gonzales 02/20/1941 [id number]   HPI:  Melissa Gonzales is a 79 y.o. female patient of Dr Melissa Gonzales, with a [place] below who presents today for hypertension and hyperlipidemia evaluation.  Her medical history is significant for CABG x 3 in 2020.   She was seen by Dr. Oval Gonzales in March with a blood pressure of 128/62.  Patient is a retired Software engineer and had slowly titrated her lisinopril to 20 mg bid for better blood pressure control.  At that appointment her carvedilol was decreased to 6.25 mg and 25 mg hydrochlorothiazide was added.  Carvedilol was titrated up and currently at 25 mg twice daily.  She has taken atorvastatin 40 mg for close to 10 years, increasing to 80 mg in February of 2020.  This was recently stopped due to a slight increase in liver enzymes.  She continues with ezetimibe.   She also notes some edema in her left leg since the CABG, but does alleviate this most days by wearing compression stockings.  Patient is scheduled to meet with [facility] about this.      Blood Pressure Goal:  130/80  Current Medications: carvedilol 25 mg bid, lisinopril 20 mg bid;  furosemide 40 mg qd  Ezetimibe 10 mg qd  Family Hx:  Mother died from CHF at 50; father died from liver cancer; sister w/o cardiac issues; 3 daughters, youngest with very high cholesterol, down with medication and weight loss  Social Hx: no tobacco; 1-2 glasses of wine daily; 2 coffee each morning  Diet: cooks most meals at home; salt only with directions in recipes; more fruit than vegetables, but plenty of both;  Mix of proteins  Exercise: pilates PT once weekly, yoga stretches most days.  Was walking but cut back d/t neuropathy and edema; has recumbant bike most day  Home BP readings: no concerns  Intolerances: knda  Labs: 03/05/20:  Na 133, K 4.5, GLu 94, BUN 20 SCr 0.86, AST 57, ALT 48  01/28/20:  TC 132, TG 80, HDL 53, LDL 63, AST 49, ALT 41  Wt Readings from Last 3 Encounters:    03/18/20 154 lb (69.9 kg)  03/05/20 155 lb (70.3 kg)  01/29/20 155 lb (70.3 kg)   BP Readings from Last 3 Encounters:  03/18/20 126/60  03/05/20 126/62  01/29/20 128/62   Pulse Readings from Last 3 Encounters:  03/18/20 68  03/05/20 84  01/29/20 77    Current Outpatient Medications  Medication Sig Dispense Refill  . ALPHAGAN P 0.1 % SOLN Place 1 drop into both eyes every 12 (twelve) hours.     . ALPRAZolam (XANAX) 0.25 MG tablet TAKE 1 TABLET BY MOUTH TWICE DAILY AS NEEDED FOR ANXIETY (Patient taking differently: Take 0.25 mg by mouth 2 (two) times daily as needed for anxiety. ) 30 tablet 0  . ARIPiprazole (ABILIFY) 2 MG tablet Take 1 tablet (2 mg total) by mouth daily.    Marland Kitchen aspirin 81 MG chewable tablet Chew 81 mg by mouth daily.    . Calcium Carb-Cholecalciferol (CALCIUM 600 + D PO) Take 1 tablet by mouth daily.     . carvedilol (COREG) 25 MG tablet Take 1 tablet (25 mg total) by mouth 2 (two) times daily. 180 tablet 1  . dorzolamide-timolol (COSOPT) 22.3-6.8 MG/ML ophthalmic solution Place 1 drop into both eyes 2 (two) times daily.     Marland Kitchen ELMIRON 100 MG capsule Take 100 mg by mouth 3 (three) times daily.    Marland Kitchen  escitalopram (LEXAPRO) 20 MG tablet Take 20 mg by mouth daily.     Marland Kitchen esomeprazole (NEXIUM) 40 MG capsule Take 1 capsule (40 mg total) by mouth daily before breakfast. 90 capsule 3  . ezetimibe (ZETIA) 10 MG tablet Take 10 mg by mouth daily.    . Fluocinolone Acetonide Scalp 0.01 % OIL     . furosemide (LASIX) 40 MG tablet Take 1 tablet (40 mg total) by mouth daily. 90 tablet 3  . levothyroxine (SYNTHROID, LEVOTHROID) 25 MCG tablet Take 1 tablet (25 mcg total) by mouth daily at 6 (six) AM. 30 tablet 1  . lisinopril (ZESTRIL) 20 MG tablet Take 1 tablet (20 mg total) by mouth in the morning and at bedtime. 180 tablet 3  . loratadine (CLARITIN) 10 MG tablet Take 10 mg by mouth daily as needed.     . Meth-Hyo-M Bl-Na Phos-Ph Sal (URIBEL) 118 MG CAPS Take 1 capsule by mouth 2  (two) times daily as needed (for pain).     . Multiple Vitamins-Minerals (MULTIVITAMIN WITH MINERALS) tablet Take 1 tablet by mouth daily.    . naproxen (NAPROSYN) 250 MG tablet Take 250 mg by mouth as needed.    . Netarsudil-Latanoprost 0.02-0.005 % SOLN Place 1 drop into both eyes at bedtime.    . nitrofurantoin (MACRODANTIN) 100 MG capsule Take 1 capsule by mouth at bedtime.    . triamcinolone cream (KENALOG) 0.1 %     . vitamin B-12 (CYANOCOBALAMIN) 100 MCG tablet Take 100 mcg by mouth daily.    . rosuvastatin (CRESTOR) 20 MG tablet Take 1 tablet (20 mg total) by mouth daily. 30 tablet 6   No current facility-administered medications for this visit.    No Known Allergies  Past Medical History:  Diagnosis Date  . Anxiety   . Arthritis   . Depression   . Diverticulosis   . GERD (gastroesophageal reflux disease)   . Glaucoma    Bilateral eyes  . Hyperlipidemia   . Hypertension   . Peripheral neuropathy   . Pneumonia    hx of  . Urgency of urination     Blood pressure 126/60, pulse 68, temperature 98.2 F (36.8 C), height 5\' 4"  (1.626 m), weight 154 lb (69.9 kg).  Essential hypertension Patient with essential hypertension, currently well controlled.  She should continue with current medications and monitor at home several times each week.    Dyslipidemia, goal LDL below 70 Patient with ASCVD and LDL at goal at 63.  Unfortunately she has had a recent increase in liver enzymes.  This is just a mild bump and has trended back down today, although not WNL, since stopping the atorvastatin 80 mg.  She has been on statin drugs (atorvastatin specifically) for over 10 years.  Will start her on rosuvastatin (more hydrophilic statin) 20 mg and have her repeat labs in one month.  Also reviewed non-statin options, including PCSK-9 inhibitors and bempedoic acid.  Discussed mechanisms of action, dosing, side effects and potential decreases in LDL cholesterol.  If her liver enzymes increase  after a trial of rosuvastatin, will consider one of these alternate treatments.    PharmD CPP [facility] [address] [place], [place] [phone]

## 2020-03-18 NOTE — Patient Instructions (Addendum)
Your Results:             Your most recent labs Goal  Total Cholesterol 132 <200  Triglycerides 80 < 150  HDL (happy/good cholesterol) 53 > 40  LDL (lousy/bad cholesterol 63 < 70     Medication changes:  Once we review the labs from today we can determine if we can go back on atorvastatin at a lower dose or try Repatha Sureclick injections  Lab orders:  Labs today.   Patient Assistance:  The [employer] foundation offers assistance to help pay for medication copays.  They will cover copays for all cholesterol lowering meds, including statins, fibrates, omega-3 oils, ezetimibe, Repatha, Praluent, Nexletol, Nexlizet.  The cards are usually good for $2,500 or 12 months, whichever comes first. 1. Go to healthwellfoundation.org 2. Click on "Apply Now" 3. Answer questions as to whom is applying (patient or representative) 4. Your disease fund will be "hypercholesterolemia - Medicare access" 5. They will ask questions about finances and which medications you are taking for cholesterol 6. When you submit, the approval is usually within minutes.  You will need to print the card information from the site 7. You will need to show this information to your pharmacy, they will bill your Medicare Part D plan first -then bill [employer] --for the copay.   You can also call them at [phone], although the hold times can be quite long.   Thank you for choosing [facility]

## 2020-03-19 ENCOUNTER — Encounter: Payer: Self-pay | Admitting: Pharmacist Clinician (PhC)/ Clinical Pharmacy Specialist

## 2020-03-19 LAB — COMPREHENSIVE METABOLIC PANEL
ALT: 41 IU/L — ABNORMAL HIGH (ref 0–32)
AST: 45 IU/L — ABNORMAL HIGH (ref 0–40)
Albumin/Globulin Ratio: 1.8 (ref 1.2–2.2)
Albumin: 4.6 g/dL (ref 3.7–4.7)
Alkaline Phosphatase: 81 IU/L (ref 39–117)
BUN/Creatinine Ratio: 19 (ref 12–28)
BUN: 14 mg/dL (ref 8–27)
Bilirubin Total: 0.4 mg/dL (ref 0.0–1.2)
CO2: 25 mmol/L (ref 20–29)
Calcium: 10.1 mg/dL (ref 8.7–10.3)
Chloride: 98 mmol/L (ref 96–106)
Creatinine, Ser: 0.73 mg/dL (ref 0.57–1.00)
GFR calc Af Amer: 91 mL/min/{1.73_m2} (ref >59)
GFR calc non Af Amer: 79 mL/min/{1.73_m2} (ref >59)
Globulin, Total: 2.5 g/dL (ref 1.5–4.5)
Glucose: 94 mg/dL (ref 65–99)
Potassium: 4.8 mmol/L (ref 3.5–5.2)
Sodium: 133 mmol/L — ABNORMAL LOW (ref 134–144)
Total Protein: 7.1 g/dL (ref 6.0–8.5)

## 2020-03-19 MED ORDER — ROSUVASTATIN CALCIUM 20 MG PO TABS
20.0000 mg | ORAL_TABLET | Freq: Every day | ORAL | 6 refills | Status: DC
Start: 2020-03-19 — End: 2020-10-12

## 2020-03-19 NOTE — Assessment & Plan Note (Addendum)
Patient with ASCVD and LDL at goal at 63.  Unfortunately she has had a recent increase in liver enzymes.  This is just a mild bump and has trended back down today, although not WNL, since stopping the atorvastatin 80 mg.  She has been on statin drugs (atorvastatin specifically) for over 10 years.  Will start her on rosuvastatin (more hydrophilic statin) 20 mg and have her repeat labs in one month.  Also reviewed non-statin options, including PCSK-9 inhibitors and bempedoic acid.  Discussed mechanisms of action, dosing, side effects and potential decreases in LDL cholesterol.  If her liver enzymes increase after a trial of rosuvastatin, will consider one of these alternate treatments.

## 2020-03-19 NOTE — Assessment & Plan Note (Signed)
Patient with essential hypertension, currently well controlled.  She should continue with current medications and monitor at home several times each week.

## 2020-03-31 ENCOUNTER — Other Ambulatory Visit: Payer: Self-pay

## 2020-03-31 ENCOUNTER — Other Ambulatory Visit (HOSPITAL_COMMUNITY): Payer: Self-pay | Admitting: Internal Medicine

## 2020-03-31 ENCOUNTER — Ambulatory Visit (HOSPITAL_COMMUNITY)
Admission: RE | Admit: 2020-03-31 | Discharge: 2020-03-31 | Disposition: A | Discharge status: Home / Self Care | Payer: Medicare Other | Source: Ambulatory Visit | Attending: Internal Medicine | Admitting: Internal Medicine

## 2020-03-31 DIAGNOSIS — M7989 Other specified soft tissue disorders: Secondary | ICD-10-CM | POA: Diagnosis not present

## 2020-04-14 ENCOUNTER — Other Ambulatory Visit: Payer: Self-pay

## 2020-04-14 ENCOUNTER — Encounter: Payer: Self-pay | Admitting: Podiatry

## 2020-04-14 ENCOUNTER — Ambulatory Visit: Payer: Medicare Other | Admitting: Podiatry

## 2020-04-14 VITALS — Temp 97.0°F

## 2020-04-14 DIAGNOSIS — M79675 Pain in left toe(s): Secondary | ICD-10-CM | POA: Diagnosis not present

## 2020-04-14 DIAGNOSIS — M79674 Pain in right toe(s): Secondary | ICD-10-CM

## 2020-04-14 DIAGNOSIS — B351 Tinea unguium: Secondary | ICD-10-CM

## 2020-04-14 DIAGNOSIS — L84 Corns and callosities: Secondary | ICD-10-CM

## 2020-04-14 DIAGNOSIS — G629 Polyneuropathy, unspecified: Secondary | ICD-10-CM | POA: Diagnosis not present

## 2020-04-15 NOTE — Progress Notes (Signed)
Subjective:   Patient ID: Melissa Gonzales, female   DOB: 79 y.o.   MRN: [id number]   HPI Patient presents with thickened nailbeds 1-5 both feet that she cannot take care of and chronic lesions on the sides of her big toes of both feet and the third digits at the ends that become sore.  States that it is become increasingly hard for her to deal with and shoe gear is uncomfortable.  Patient does not smoke likes to be active and states that she has minimal feeling in her feet   Review of Systems  All other systems reviewed and are negative.       Objective:  Physical Exam Vitals and nursing note reviewed.  Constitutional:      Appearance: She is well-developed.  Pulmonary:     Effort: Pulmonary effort is normal.  Musculoskeletal:        General: Normal range of motion.  Skin:    General: Skin is warm.  Neurological:     Mental Status: She is alert.     Vascular status found to be mildly diminished but intact with patient found to have diminishment of sharp dull vibratory bilateral.  Patient has thick damaged nailbeds 1-5 both feet that are incurvated in the corners with keratotic lesions on the sides of the hallux bilateral and third digits bilaterally elongated digits and painful distal lesion formation.  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  I do not recommend permanent procedure but it may be necessary at 1 point in future and at this point I debrided nailbeds 5 both feet with no iatrogenic bleeding and then debrided lesion sharply 1-5 both the with no iatrogenic bleeding and patient will be seen back for routine care 3 months or earlier if needed

## 2020-05-15 LAB — HEPATIC FUNCTION PANEL
ALT: 26 IU/L (ref 0–32)
AST: 33 IU/L (ref 0–40)
Albumin: 4.6 g/dL (ref 3.7–4.7)
Alkaline Phosphatase: 95 IU/L (ref 48–121)
Bilirubin Total: 0.5 mg/dL (ref 0.0–1.2)
Bilirubin, Direct: 0.24 mg/dL (ref 0.00–0.40)
Total Protein: 7 g/dL (ref 6.0–8.5)

## 2020-05-15 LAB — LIPID PANEL
Chol/HDL Ratio: 2.4 ratio (ref 0.0–4.4)
Cholesterol, Total: 123 mg/dL (ref 100–199)
HDL: 51 mg/dL (ref >39)
LDL Chol Calc (NIH): 56 mg/dL (ref 0–99)
Triglycerides: 80 mg/dL (ref 0–149)
VLDL Cholesterol Cal: 16 mg/dL (ref 5–40)

## 2020-05-18 ENCOUNTER — Ambulatory Visit: Payer: Medicare Other | Admitting: Cardiovascular Disease

## 2020-05-18 ENCOUNTER — Encounter: Payer: Self-pay | Admitting: Cardiovascular Disease

## 2020-05-18 ENCOUNTER — Other Ambulatory Visit: Payer: Self-pay

## 2020-05-18 VITALS — BP 128/68 | HR 69 | Temp 97.1°F | Ht 64.0 in | Wt 156.0 lb

## 2020-05-18 DIAGNOSIS — R079 Chest pain, unspecified: Secondary | ICD-10-CM

## 2020-05-18 NOTE — Patient Instructions (Addendum)
Medication Instructions:  Your physician recommends that you continue on your current medications as directed. Please refer to the Current Medication list given to you today.  *If you need a refill on your cardiac medications before your next appointment, please call your pharmacy*  Lab Work: NONE   Testing/Procedures: A chest x-ray takes a picture of the organs and structures inside the chest, including the heart, lungs, and blood vessels. This test can show several things, including, whether the heart is enlarges; whether fluid is building up in the lungs; and whether pacemaker / defibrillator leads are still in place. [facility] Monday-Friday 8:30 AM TO 4:00 PM NO APPOINTMENT NECESSARY   Follow-Up: At [facility], you and your health needs are our priority.  As part of our continuing mission to provide you with exceptional heart care, we have created designated Provider Care Teams.  These Care Teams include your primary Cardiologist (physician) and Advanced Practice Providers (APPs -  Physician Assistants and Nurse Practitioners) who all work together to provide you with the care you need, when you need it.  We recommend signing up for the patient portal called "MyChart".  Sign up information is provided on this After Visit Summary.  MyChart is used to connect with patients for Virtual Visits (Telemedicine).  Patients are able to view lab/test results, encounter notes, upcoming appointments, etc.  Non-urgent messages can be sent to your provider as well.   To learn more about what you can do with MyChart, go to [email or web address].    Your next appointment:   6 month(s) You will receive a reminder letter in the mail two months in advance. If you don't receive a letter, please call our office to schedule the follow-up appointment.  The format for your next appointment:   In Person  Provider:   You may see Chilton Si, MD or one of the following Advanced Practice  Providers on your designated Care Team:    Corine Shelter, PA-C  [place], [place]  Edd Fabian, [place]

## 2020-05-18 NOTE — Progress Notes (Addendum)
Cardiology Office Note   Date:  05/18/2020   ID:  Melissa Gonzales, DOB Oct 13, 1941, MRN [id number]  PCP:  Rodrigo RanPerini, Mark, MD  Cardiologist:   Chilton Siiffany [place], MD   No chief complaint on file.    History of Present Illness: Melissa PodCarol Ann Savant is a 79 y.o. 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  She was initially admitted 12/2018 with NSTEMI.  She underwent LHC and was found to have 3 vessel CAD.  She underwent three-vessel CABG with Dr. Donata ClayVan Trigt on 12/19/18.  She required diuresis after discharge.  She was started on HCTZ 01/2019 due to poorly controlled hypertension. She saw Corine ShelterLuke Kilroy virtually on 03/2019 and complained of fatigue and depression.  She has been treated with Lexapro and recently added Wellbutrin, which has helped.  Carvedilol was increased and she was started on lisinopril due to poorly controlled hypertension.  She continues to struggle with L>R LE edema since surgery.  She has no orthopnea or PND.  She also has neuropathy which makes it hard for her to walk and exercise.  Atorvastatin was discontinued due to transaminitis.  Her liver function improved and she was started on low-dose rosuvastatin.  At her last appointment she had lower extremity edema that was thought to be more due to venous insufficiency than heart failure.  BNP was 43.  Hydrochlorothiazide was switched to furosemide.  She was referred to [facility].  She was found to have Baker's cyst but no DVTs.  No interventions were planned.  She does Pilates once per week.  She struggles with walking due to her lower extremity edema and neuropathy.  She has some episodes of chest pressure that occur when bending over.  This has been ongoing for several months.  She also reports some gastroesophageal reflux disease.  She has no exertional chest pain or pressure and her breathing has been stable.  She denies orthopnea or PND.  At home her blood  pressure has been very well-controlled.  It has been in the 120s over 70s.  She does pilates once per week She struggles with walking due to swelling and discomfort in her legs.  She saw vein and vascular who noted that she had   Past Medical History:  Diagnosis Date  . Anxiety   . Arthritis   . Depression   . Diverticulosis   . GERD (gastroesophageal reflux disease)   . Glaucoma    Bilateral eyes  . Hyperlipidemia   . Hypertension   . Peripheral neuropathy   . Pneumonia    hx of  . Urgency of urination     Past Surgical History:  Procedure Laterality Date  . BACK SURGERY    . CHOLECYSTECTOMY  1993  . COLONOSCOPY    . CORONARY ARTERY BYPASS GRAFT N/A 12/19/2018   Procedure: CORONARY ARTERY BYPASS GRAFTING (CABG)x3, using left internal mammary arteryand right and left greater saphenous veins harvested endoscopically;  Surgeon: Kerin PernaVan Trigt, Peter, MD;  [facility];  Service: Open Heart Surgery;  Laterality: N/A;  . EYE SURGERY     Cataract Surgery Bilateral, 5 years ago  . LEFT HEART CATH AND CORONARY ANGIOGRAPHY N/A 12/17/2018   Procedure: LEFT HEART CATH AND CORONARY ANGIOGRAPHY;  Surgeon: Lennette BihariKelly, Thomas A, MD;  Location: [facility] INVASIVE CV LAB;  Service: Cardiovascular;  Laterality: N/A;  . MAXIMUM ACCESS (MAS)POSTERIOR LUMBAR INTERBODY FUSION (PLIF) 1 LEVEL N/A 03/17/2015   Procedure: Lumbar five -sacral  one Maximum access posterior lumbar interbody fusion/Lumbar two-three  Laminectomy and extension of instrumentation to Sacral-one;  Surgeon: Tia Alert, MD;  Location: [facility] NEURO ORS;  Service: Neurosurgery;  Laterality: N/A;  . MAXIMUM ACCESS (MAS)POSTERIOR LUMBAR INTERBODY FUSION (PLIF) 2 LEVEL N/A 11/27/2014   Procedure: LUMBAR THREE-FOUR, LUMBAR FOUR-FIVE MAXIMUM ACCESS POSTERIOR LUMBAR INTERBODY FUSION;  Surgeon: Tia Alert, MD;  Location: [facility] NEURO ORS;  Service: Neurosurgery;  Laterality: N/A;  L3-4 L4-5 MAXIMUM ACCESS POSTERIOR LUMBAR INTERBODY FUSION  . TEE WITHOUT  CARDIOVERSION N/A 12/19/2018   Procedure: TRANSESOPHAGEAL ECHOCARDIOGRAM (TEE);  Surgeon: Donata Clay, Theron Arista, MD;  Location: [facility] OR;  Service: Open Heart Surgery;  Laterality: N/A;  . VAGINAL HYSTERECTOMY  1970s   uterine prolapse     Current Outpatient Medications  Medication Sig Dispense Refill  . ALPHAGAN P 0.1 % SOLN Place 1 drop into both eyes every 12 (twelve) hours.     . ALPRAZolam (XANAX) 0.25 MG tablet TAKE 1 TABLET BY MOUTH TWICE DAILY AS NEEDED FOR ANXIETY (Patient taking differently: Take 0.25 mg by mouth 2 (two) times daily as needed for anxiety. ) 30 tablet 0  . ARIPiprazole (ABILIFY) 2 MG tablet Take 1 tablet (2 mg total) by mouth daily.    Marland Kitchen aspirin 81 MG chewable tablet Chew 81 mg by mouth daily.    . Calcium Carb-Cholecalciferol (CALCIUM 600 + D PO) Take 1 tablet by mouth daily.     . carvedilol (COREG) 25 MG tablet Take 1 tablet (25 mg total) by mouth 2 (two) times daily. 180 tablet 1  . dorzolamide-timolol (COSOPT) 22.3-6.8 MG/ML ophthalmic solution Place 1 drop into both eyes 2 (two) times daily.     Marland Kitchen ELMIRON 100 MG capsule Take 100 mg by mouth 3 (three) times daily.    Marland Kitchen escitalopram (LEXAPRO) 20 MG tablet Take 20 mg by mouth daily.     Marland Kitchen esomeprazole (NEXIUM) 40 MG capsule Take 1 capsule (40 mg total) by mouth daily before breakfast. 90 capsule 3  . ezetimibe (ZETIA) 10 MG tablet Take 10 mg by mouth daily.    . Fluocinolone Acetonide Scalp 0.01 % OIL     . furosemide (LASIX) 40 MG tablet Take 1 tablet (40 mg total) by mouth daily. 90 tablet 3  . levothyroxine (SYNTHROID, LEVOTHROID) 25 MCG tablet Take 1 tablet (25 mcg total) by mouth daily at 6 (six) AM. 30 tablet 1  . loratadine (CLARITIN) 10 MG tablet Take 10 mg by mouth daily as needed.     . Meth-Hyo-M Bl-Na Phos-Ph Sal (URIBEL) 118 MG CAPS Take 1 capsule by mouth 2 (two) times daily as needed (for pain).     . Multiple Vitamins-Minerals (MULTIVITAMIN WITH MINERALS) tablet Take 1 tablet by mouth daily.    . naproxen  (NAPROSYN) 250 MG tablet Take 250 mg by mouth as needed.    . Netarsudil-Latanoprost 0.02-0.005 % SOLN Place 1 drop into both eyes at bedtime.    . nitrofurantoin (MACRODANTIN) 100 MG capsule Take 1 capsule by mouth at bedtime.    . rosuvastatin (CRESTOR) 20 MG tablet Take 1 tablet (20 mg total) by mouth daily. 30 tablet 6  . triamcinolone cream (KENALOG) 0.1 %     . vitamin B-12 (CYANOCOBALAMIN) 100 MCG tablet Take 100 mcg by mouth daily.    Marland Kitchen lisinopril (ZESTRIL) 20 MG tablet Take 1 tablet (20 mg total) by mouth in the morning and at bedtime. 180 tablet 3   No current facility-administered medications for this  visit.    Allergies:   Patient has no known allergies.    Social History:  The patient  reports that she has never smoked. She has never used smokeless tobacco. She reports current alcohol use of about 1.0 standard drink of alcohol per week. She reports that she does not use drugs.   Family History:  The patient's family history includes Cancer in her father; Drug abuse in her grandchild; Heart disease (age of onset: 39) in her mother; Hyperlipidemia in her sister; Hypertension in her sister; Mental illness in her daughter.    ROS:  Please see the history of present illness.   Otherwise, review of systems are positive for none.   All other systems are reviewed and negative.    PHYSICAL EXAM: VS:  BP 128/68   Pulse 69   Temp (!) 97.1 F (36.2 C)   Ht 5\' 4"  (1.626 m)   Wt 156 lb (70.8 kg)   SpO2 93%   BMI 26.78 kg/m  , BMI Body mass index is 26.78 kg/m. GENERAL:  Well appearing HEENT: Pupils equal round and reactive, fundi not visualized, oral mucosa unremarkable NECK:  No jugular venous distention, waveform within normal limits, carotid upstroke brisk and symmetric, no bruits LUNGS:  Clear to auscultation bilaterally HEART:  RRR.  PMI not displaced or sustained,S1 and S2 within normal limits, no S3, no S4, no clicks, no rubs, II/VI systolic murmur at the LUSB ABD:  Flat,  positive bowel sounds normal in frequency in pitch, no bruits, no rebound, no guarding, no midline pulsatile mass, no hepatomegaly, no splenomegaly EXT:  2 plus pulses throughout, L>R LE edema, no cyanosis no clubbing SKIN:  No rashes no nodules NEURO:  Cranial nerves II through XII grossly intact, motor grossly intact throughout PSYCH:  Cognitively intact, oriented to person place and time   EKG:  EKG is ordered today. The ekg ordered 09/25/19 demonstrates sinus rhythm.  Rate 72 bpm.  05/18/2020: Sinus rhythm.  Rate 69 bpm.  LHC 12/17/18:  Ost RCA to Mid RCA lesion is 100% stenosed.  Prox Cx lesion is 40% stenosed.  Mid Cx to Dist Cx lesion is 85% stenosed.  RPDA lesion is 95% stenosed.  Ost RPDA to RPDA lesion is 90% stenosed.  Dist LM to Ost LAD lesion is 35% stenosed.  Prox LAD lesion is 70% stenosed.  Prox LAD to Mid LAD lesion is 90% stenosed.   Echo 12/15/18:   1. The left ventricle has hyperdynamic systolic function of >65%. The cavity size was normal. There is mildly increased left ventricular wall thickness. Echo evidence of impaired diastolic relaxation Elevated left ventricular end-diastolic pressure The  E/e' is 15.6.  2. The right ventricle has normal systolic function. The cavity was normal. There is no increase in right ventricular wall thickness.  3. The mitral valve is normal in structure. There is mild mitral annular calcification present.  4. The tricuspid valve is normal in structure.Tricuspid valve regurgitation is mild by color flow Doppler.  5. The aortic valve is tricuspid Aortic valve regurgitation is moderate by color flow Doppler.  6. The pulmonic valve was normal in structure.  Recent Labs: 03/05/2020: BNP 44.8 03/18/2020: BUN 14; Creatinine, Ser 0.73; Potassium 4.8; Sodium 133 05/14/2020: ALT 26    Lipid Panel    Component Value Date/Time   CHOL 123 05/14/2020 1154   TRIG 80 05/14/2020 1154   HDL 51 05/14/2020 1154   CHOLHDL 2.4 05/14/2020 1154    CHOLHDL 3.1 12/15/2018 0259  VLDL 23 12/15/2018 0259   LDLCALC 56 05/14/2020 1154   LDLDIRECT 118 (H) 06/02/2008 2046      Wt Readings from Last 3 Encounters:  05/18/20 156 lb (70.8 kg)  03/18/20 154 lb (69.9 kg)  03/05/20 155 lb (70.3 kg)      ASSESSMENT AND PLAN:  # CAD s/p CABG: Stable without angina.  Continue aspirin, atorvastatin, carvedilol, and Zetia.  # Chest pain:   Symptoms only occur when bending over and she also has GERD.  This does not seem ischemic.  We will get a chest x-ray to make sure she does not have a hiatal hernia.  None was present on her chest x-ray 01/2019.  # Hyperlipidemia:   Lipids at goal on rosuvastatin and Zetia  # Essential hypertension: Blood pressure well-controlled on carvedilol, lisinopril, and Lasix.  # Venous insufficiency: Her edema started after her bypass surgery.  This is likely due to venous insufficiency and neuropathy.  Encourage compression stockings.  Continue lasix.     Current medicines are reviewed at length with the patient today.  The patient does not have concerns regarding medicines.  The following changes have been made: none  Labs/ tests ordered today include:   Orders Placed This Encounter  Procedures  . DG Chest 2 View  . EKG 12-Lead     Disposition:   FU with Eliseo Withers C. Duke Salvia, MD, [facility] in 6 months    Signed, Dannie Hattabaugh C. Duke Salvia, MD, [facility]  05/18/2020 2:30 PM    [facility]

## 2020-06-21 ENCOUNTER — Ambulatory Visit
Admission: RE | Admit: 2020-06-21 | Discharge: 2020-06-21 | Disposition: A | Discharge status: Home / Self Care | Payer: Medicare Other | Source: Ambulatory Visit | Attending: Cardiovascular Disease | Admitting: Cardiovascular Disease

## 2020-06-21 ENCOUNTER — Other Ambulatory Visit: Payer: Self-pay

## 2020-07-20 ENCOUNTER — Ambulatory Visit: Payer: Medicare Other | Admitting: Podiatry

## 2020-08-29 ENCOUNTER — Other Ambulatory Visit: Payer: Self-pay | Admitting: Cardiovascular Disease

## 2020-10-10 ENCOUNTER — Other Ambulatory Visit: Payer: Self-pay | Admitting: Cardiovascular Disease

## 2020-10-11 ENCOUNTER — Other Ambulatory Visit: Payer: Self-pay | Admitting: Internal Medicine

## 2020-10-11 DIAGNOSIS — Z1231 Encounter for screening mammogram for malignant neoplasm of breast: Secondary | ICD-10-CM

## 2020-10-14 ENCOUNTER — Other Ambulatory Visit: Payer: Self-pay | Admitting: Cardiovascular Disease

## 2020-11-23 ENCOUNTER — Ambulatory Visit: Payer: Medicare Other

## 2021-01-04 ENCOUNTER — Other Ambulatory Visit: Payer: Self-pay

## 2021-01-04 ENCOUNTER — Ambulatory Visit: Payer: Medicare Other

## 2021-01-04 ENCOUNTER — Ambulatory Visit
Admission: RE | Admit: 2021-01-04 | Discharge: 2021-01-04 | Disposition: A | Discharge status: Home / Self Care | Payer: Medicare Other | Source: Ambulatory Visit | Attending: Internal Medicine | Admitting: Internal Medicine

## 2021-01-04 DIAGNOSIS — Z1231 Encounter for screening mammogram for malignant neoplasm of breast: Secondary | ICD-10-CM

## 2021-01-11 ENCOUNTER — Other Ambulatory Visit: Payer: Self-pay | Admitting: Cardiovascular Disease

## 2021-02-04 ENCOUNTER — Telehealth (INDEPENDENT_AMBULATORY_CARE_PROVIDER_SITE_OTHER): Payer: Medicare Other | Admitting: Cardiovascular Disease

## 2021-02-04 ENCOUNTER — Encounter: Payer: Self-pay | Admitting: Cardiovascular Disease

## 2021-02-04 VITALS — BP 124/72 | HR 75 | Ht 63.0 in | Wt 147.0 lb

## 2021-02-04 DIAGNOSIS — Z951 Presence of aortocoronary bypass graft: Secondary | ICD-10-CM | POA: Diagnosis not present

## 2021-02-04 DIAGNOSIS — E785 Hyperlipidemia, unspecified: Secondary | ICD-10-CM | POA: Diagnosis not present

## 2021-02-04 DIAGNOSIS — I10 Essential (primary) hypertension: Secondary | ICD-10-CM

## 2021-02-04 NOTE — Patient Instructions (Signed)
Medication Instructions:  Your physician recommends that you continue on your current medications as directed. Please refer to the Current Medication list given to you today.   *If you need a refill on your cardiac medications before your next appointment, please call your pharmacy*  Lab Work: NONE  Testing/Procedures: NONE  Follow-Up: At [facility], you and your health needs are our priority.  As part of our continuing mission to provide you with exceptional heart care, we have created designated Provider Care Teams.  These Care Teams include your primary Cardiologist (physician) and Advanced Practice Providers (APPs -  Physician Assistants and Nurse Practitioners) who all work together to provide you with the care you need, when you need it.  We recommend signing up for the patient portal called "MyChart".  Sign up information is provided on this After Visit Summary.  MyChart is used to connect with patients for Virtual Visits (Telemedicine).  Patients are able to view lab/test results, encounter notes, upcoming appointments, etc.  Non-urgent messages can be sent to your provider as well.   To learn more about what you can do with MyChart, go to [email or web address].    Your next appointment:   12 month(s)  The format for your next appointment:   In Person  Provider:   DR Jackson - [facility] OR PA/NP AT [facility]

## 2021-02-04 NOTE — Progress Notes (Signed)
Virtual Visit via Video Note   This visit type was conducted due to national recommendations for restrictions regarding the COVID-19 Pandemic (e.g. social distancing) in an effort to limit this patient's exposure and mitigate transmission in our community.  Due to her co-morbid illnesses, this patient is at least at moderate risk for complications without adequate follow up.  This format is felt to be most appropriate for this patient at this time.  All issues noted in this document were discussed and addressed.  A limited physical exam was performed with this format.  Please refer to the patient's chart for her consent to telehealth for [facility].  The patient was identified using 2 identifiers.  Date:  02/04/2021   ID:  Melissa Gonzales, DOB 05-16-1941, MRN [id number]  Patient Location: Home Provider Location: Office/Clinic  PCP:  Rodrigo Ran, MD  Cardiologist:  Chilton Si, MD  Electrophysiologist:  None   Evaluation Performed:  Follow-Up Visit  Chief Complaint:  CAD  History of Present Illness:     The patient does not have symptoms concerning for COVID-19 infection (fever, chills, cough, or new shortness of breath).    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  She was initially admitted 12/2018 with NSTEMI.  She underwent LHC and was found to have 3 vessel CAD.  She underwent three-vessel CABG with Dr. Donata Clay on 12/19/18.  She required diuresis after discharge.  She was started on HCTZ 01/2019 due to poorly controlled hypertension. She saw Corine Shelter virtually on 03/2019 and complained of fatigue and depression.  She has been treated with Lexapro and recently added Wellbutrin, which has helped.  Carvedilol was increased and she was started on lisinopril due to poorly controlled hypertension.  She continues to struggle with L>R LE edema since surgery.  She has no orthopnea or PND.   She also has neuropathy which makes it hard for her to walk and exercise.  Atorvastatin was discontinued due to transaminitis.  Her liver function improved and she was started on low-dose rosuvastatin.  At her last appointment she had lower extremity edema that was thought to be more due to venous insufficiency than heart failure.  BNP was 44.  Hydrochlorothiazide was switched to furosemide.  She was referred to [facility].  Since her last appointment she has been doing well.  She has been following the Nutrisystem diet and using portion control.  She has been able to lose 10 pounds with this approach.  She also exercises daily for 45 minutes to an hour.  She is mostly limited by arthritis in her feet, knees, and spine.  She has no exertional chest pain and her breathing is stable.  She has lower extremity edema that improves with compression socks and elevation.  She denies orthopnea or PND.  Overall she is doing well without complaint.  Past Medical History:  Diagnosis Date  . Anxiety   . Arthritis   . Depression   . Diverticulosis   . GERD (gastroesophageal reflux disease)   . Glaucoma    Bilateral eyes  . Hyperlipidemia   . Hypertension   . Peripheral neuropathy   . Pneumonia    hx of  . Urgency of urination     Past Surgical History:  Procedure Laterality Date  . BACK SURGERY    . CHOLECYSTECTOMY  1993  . COLONOSCOPY    . CORONARY ARTERY BYPASS  GRAFT N/A 12/19/2018   Procedure: CORONARY ARTERY BYPASS GRAFTING (CABG)x3, using left internal mammary arteryand right and left greater saphenous veins harvested endoscopically;  Surgeon: Kerin Perna, MD;  Location: [facility];  Service: Open Heart Surgery;  Laterality: N/A;  . EYE SURGERY     Cataract Surgery Bilateral, 5 years ago  . LEFT HEART CATH AND CORONARY ANGIOGRAPHY N/A 12/17/2018   Procedure: LEFT HEART CATH AND CORONARY ANGIOGRAPHY;  Surgeon: Lennette Bihari, MD;  Location: [facility] INVASIVE CV LAB;  Service:  Cardiovascular;  Laterality: N/A;  . MAXIMUM ACCESS (MAS)POSTERIOR LUMBAR INTERBODY FUSION (PLIF) 1 LEVEL N/A 03/17/2015   Procedure: Lumbar five -sacral one Maximum access posterior lumbar interbody fusion/Lumbar two-three  Laminectomy and extension of instrumentation to Sacral-one;  Surgeon: Tia Alert, MD;  Location: [facility] NEURO ORS;  Service: Neurosurgery;  Laterality: N/A;  . MAXIMUM ACCESS (MAS)POSTERIOR LUMBAR INTERBODY FUSION (PLIF) 2 LEVEL N/A 11/27/2014   Procedure: LUMBAR THREE-FOUR, LUMBAR FOUR-FIVE MAXIMUM ACCESS POSTERIOR LUMBAR INTERBODY FUSION;  Surgeon: Tia Alert, MD;  Location: [facility] NEURO ORS;  Service: Neurosurgery;  Laterality: N/A;  L3-4 L4-5 MAXIMUM ACCESS POSTERIOR LUMBAR INTERBODY FUSION  . TEE WITHOUT CARDIOVERSION N/A 12/19/2018   Procedure: TRANSESOPHAGEAL ECHOCARDIOGRAM (TEE);  Surgeon: Donata Clay, Theron Arista, MD;  Location: [facility] OR;  Service: Open Heart Surgery;  Laterality: N/A;  . VAGINAL HYSTERECTOMY  1970s   uterine prolapse     Current Outpatient Medications  Medication Sig Dispense Refill  . ALPHAGAN P 0.1 % SOLN Place 1 drop into both eyes every 12 (twelve) hours.     . ALPRAZolam (XANAX) 0.25 MG tablet TAKE 1 TABLET BY MOUTH TWICE DAILY AS NEEDED FOR ANXIETY (Patient taking differently: Take 0.25 mg by mouth 2 (two) times daily as needed for anxiety.) 30 tablet 0  . ARIPiprazole (ABILIFY) 5 MG tablet Take 5 mg by mouth daily.    Marland Kitchen aspirin 81 MG chewable tablet Chew 81 mg by mouth daily.    . Calcium Carb-Cholecalciferol (CALCIUM 600 + D PO) Take 1 tablet by mouth daily.     . carvedilol (COREG) 25 MG tablet TAKE 1 TABLET(25 MG) BY MOUTH TWICE DAILY 180 tablet 3  . dorzolamide-timolol (COSOPT) 22.3-6.8 MG/ML ophthalmic solution Place 1 drop into both eyes 2 (two) times daily.     Marland Kitchen ELMIRON 100 MG capsule Take 100 mg by mouth 3 (three) times daily.    Marland Kitchen escitalopram (LEXAPRO) 20 MG tablet Take 20 mg by mouth daily.     Marland Kitchen esomeprazole (NEXIUM) 40 MG capsule Take 1  capsule (40 mg total) by mouth daily before breakfast. 90 capsule 3  . ezetimibe (ZETIA) 10 MG tablet TAKE 1 TABLET(10 MG) BY MOUTH DAILY 90 tablet 0  . Fluocinolone Acetonide Scalp 0.01 % OIL     . furosemide (LASIX) 40 MG tablet Take 1 tablet (40 mg total) by mouth daily. 90 tablet 3  . levothyroxine (SYNTHROID, LEVOTHROID) 25 MCG tablet Take 1 tablet (25 mcg total) by mouth daily at 6 (six) AM. 30 tablet 1  . lisinopril (ZESTRIL) 20 MG tablet Take 1 tablet (20 mg total) by mouth in the morning and at bedtime. 180 tablet 3  . loratadine (CLARITIN) 10 MG tablet Take 10 mg by mouth daily as needed.     . Meth-Hyo-M Bl-Na Phos-Ph Sal (URIBEL) 118 MG CAPS Take 1 capsule by mouth 2 (two) times daily as needed (for pain).     . Multiple Vitamins-Minerals (MULTIVITAMIN WITH MINERALS) tablet Take 1 tablet  by mouth daily.    . naproxen (NAPROSYN) 250 MG tablet Take 250 mg by mouth as needed.    . Netarsudil-Latanoprost 0.02-0.005 % SOLN Place 1 drop into both eyes at bedtime.    . nitrofurantoin (MACRODANTIN) 100 MG capsule Take 1 capsule by mouth at bedtime.    . rosuvastatin (CRESTOR) 20 MG tablet TAKE 1 TABLET(20 MG) BY MOUTH DAILY 30 tablet 7  . triamcinolone cream (KENALOG) 0.1 %     . vitamin B-12 (CYANOCOBALAMIN) 100 MCG tablet Take 100 mcg by mouth daily.     No current facility-administered medications for this visit.    Allergies:   Patient has no known allergies.    Social History:  The patient  reports that she has never smoked. She has never used smokeless tobacco. She reports current alcohol use of about 1.0 standard drink of alcohol per week. She reports that she does not use drugs.   Family History:  The patient's family history includes Cancer in her father; Drug abuse in her grandchild; Heart disease (age of onset: 53) in her mother; Hyperlipidemia in her sister; Hypertension in her sister; Mental illness in her daughter.    ROS:  Please see the history of present illness.    Otherwise, review of systems are positive for none.   All other systems are reviewed and negative.    PHYSICAL EXAM: BP 124/72   Pulse 75   Ht 5\' 3"  (1.6 m)   Wt 147 lb (66.7 kg)   BMI 26.04 kg/m  GENERAL: Well-appearing.  No acute distress. HEENT: Pupils equal round.  Oral mucosa unremarkable NECK:  No jugular venous distention, no visible thyromegaly EXT:  No edema, no cyanosis no clubbing SKIN:  No rashes no nodules NEURO:  Speech fluent.  Cranial nerves grossly intact.  Moves all 4 extremities freely PSYCH:  Cognitively intact, oriented to person place and time   EKG:  EKG is  not ordered today. The ekg ordered 09/25/19 demonstrates sinus rhythm.  Rate 72 bpm.  05/18/2020: Sinus rhythm.  Rate 69 bpm.  LHC 12/17/18:  Ost RCA to Mid RCA lesion is 100% stenosed.  Prox Cx lesion is 40% stenosed.  Mid Cx to Dist Cx lesion is 85% stenosed.  RPDA lesion is 95% stenosed.  Ost RPDA to RPDA lesion is 90% stenosed.  Dist LM to Ost LAD lesion is 35% stenosed.  Prox LAD lesion is 70% stenosed.  Prox LAD to Mid LAD lesion is 90% stenosed.   Echo 12/15/18:   1. The left ventricle has hyperdynamic systolic function of >65%. The cavity size was normal. There is mildly increased left ventricular wall thickness. Echo evidence of impaired diastolic relaxation Elevated left ventricular end-diastolic pressure The  E/e' is 15.6.  2. The right ventricle has normal systolic function. The cavity was normal. There is no increase in right ventricular wall thickness.  3. The mitral valve is normal in structure. There is mild mitral annular calcification present.  4. The tricuspid valve is normal in structure.Tricuspid valve regurgitation is mild by color flow Doppler.  5. The aortic valve is tricuspid Aortic valve regurgitation is moderate by color flow Doppler.  6. The pulmonic valve was normal in structure.  Recent Labs: 03/05/2020: BNP 44.8 03/18/2020: BUN 14; Creatinine, Ser 0.73; Potassium  4.8; Sodium 133 05/14/2020: ALT 26    Lipid Panel    Component Value Date/Time   CHOL 123 05/14/2020 1154   TRIG 80 05/14/2020 1154   HDL 51 05/14/2020 1154  CHOLHDL 2.4 05/14/2020 1154   CHOLHDL 3.1 12/15/2018 0259   VLDL 23 12/15/2018 0259   LDLCALC 56 05/14/2020 1154   LDLDIRECT 118 (H) 06/02/2008 2046      Wt Readings from Last 3 Encounters:  02/04/21 147 lb (66.7 kg)  05/18/20 156 lb (70.8 kg)  03/18/20 154 lb (69.9 kg)      ASSESSMENT AND PLAN:  # CAD s/p CABG: Stable without angina.  Continue aspirin, atorvastatin, carvedilol, and Zetia.  # Hyperlipidemia:   Lipids at goal on rosuvastatin and Zetia.  She gets this checked with Dr. Waynard EdwardsPerini.  # Essential hypertension: Blood pressure remains well-controlled on carvedilol, lisinopril, and Lasix.  # Venous insufficiency: Her edema started after her bypass surgery.  This is likely due to venous insufficiency and neuropathy.  Continue with elevation and compression stockings.    COVID-19 Education: The signs and symptoms of COVID-19 were discussed with the patient and how to seek care for testing (follow up with PCP or arrange E-visit).  The importance of social distancing was discussed today.  Time:   Today, I have spent 16 minutes with the patient with telehealth technology discussing the above problems.   Current medicines are reviewed at length with the patient today.  The patient does not have concerns regarding medicines.  The following changes have been made: none  Labs/ tests ordered today include:   No orders of the defined types were placed in this encounter.    Disposition:   FU with Accalia Rigdon C. Duke Salviaandolph, MD, [facility] in 6 months    Signed, Davanna He C. Duke Salviaandolph, MD, [facility]  02/04/2021 10:26 AM    [facility]

## 2021-02-23 ENCOUNTER — Other Ambulatory Visit: Payer: Self-pay | Admitting: Cardiovascular Disease

## 2021-02-23 ENCOUNTER — Other Ambulatory Visit: Payer: Self-pay

## 2021-02-23 MED ORDER — LISINOPRIL 20 MG PO TABS: ORAL_TABLET | ORAL | 3 refills | Status: DC

## 2021-03-03 ENCOUNTER — Other Ambulatory Visit: Payer: Self-pay | Admitting: Cardiovascular Disease

## 2021-03-24 IMAGING — MG MM DIGITAL SCREENING BILAT W/ TOMO AND CAD
6 of 10 series · 6 of 30 positions shown · non-contrast
Comparison: Previous exam(s).

CLINICAL DATA: Screening.

EXAM:
DIGITAL SCREENING BILATERAL MAMMOGRAM WITH TOMOSYNTHESIS AND CAD
TECHNIQUE: Bilateral screening digital craniocaudal and mediolateral oblique
mammograms were obtained. Bilateral screening digital breast
tomosynthesis was performed. The images were evaluated with
computer-aided detection.

[L CC synth-2D]
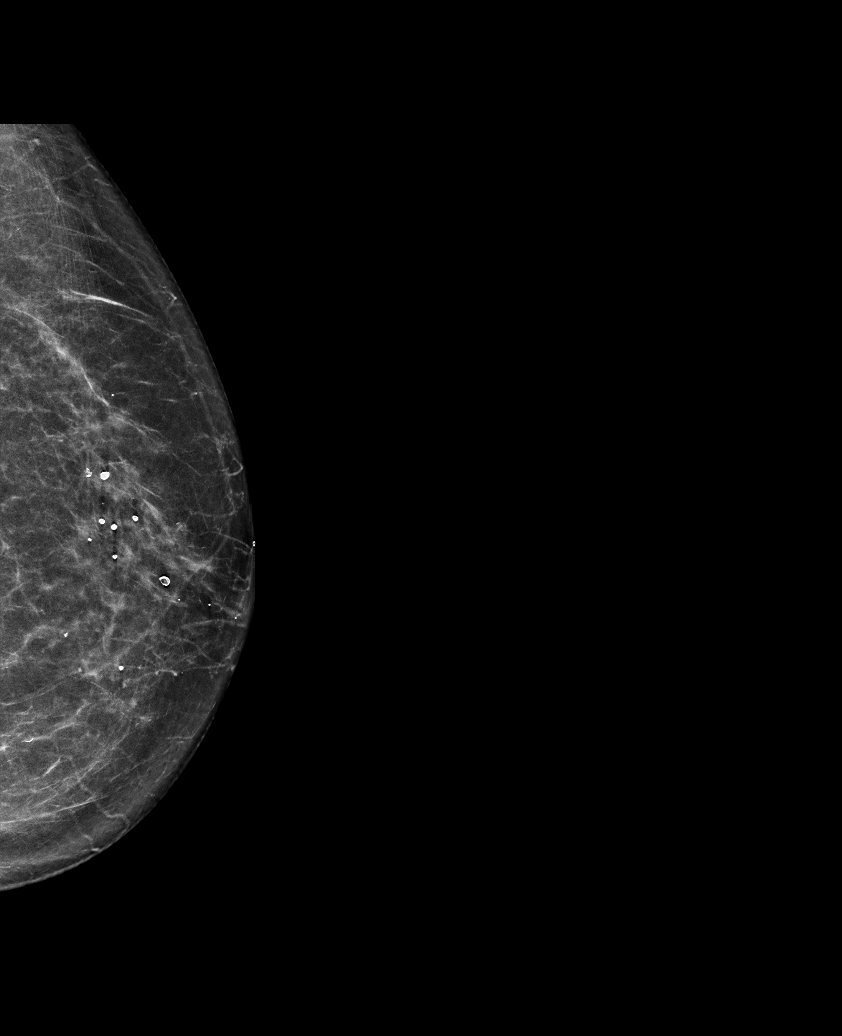

[L MLO synth-2D]
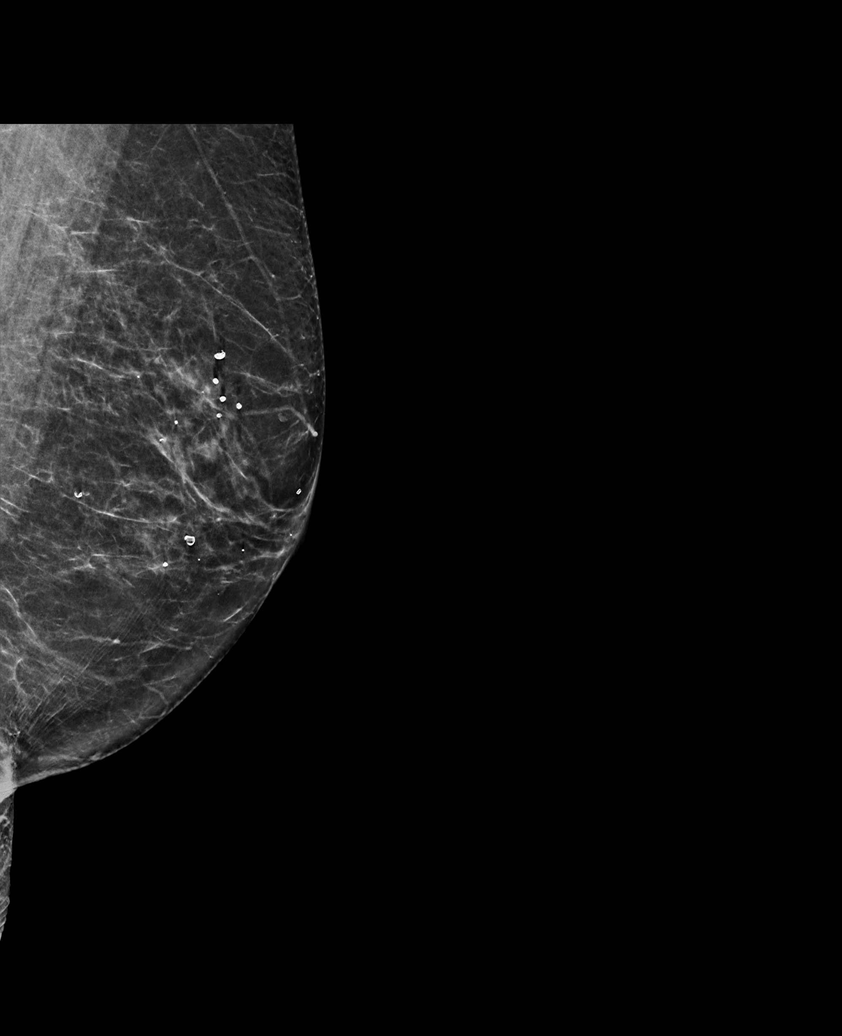

[R MLO synth-2D]
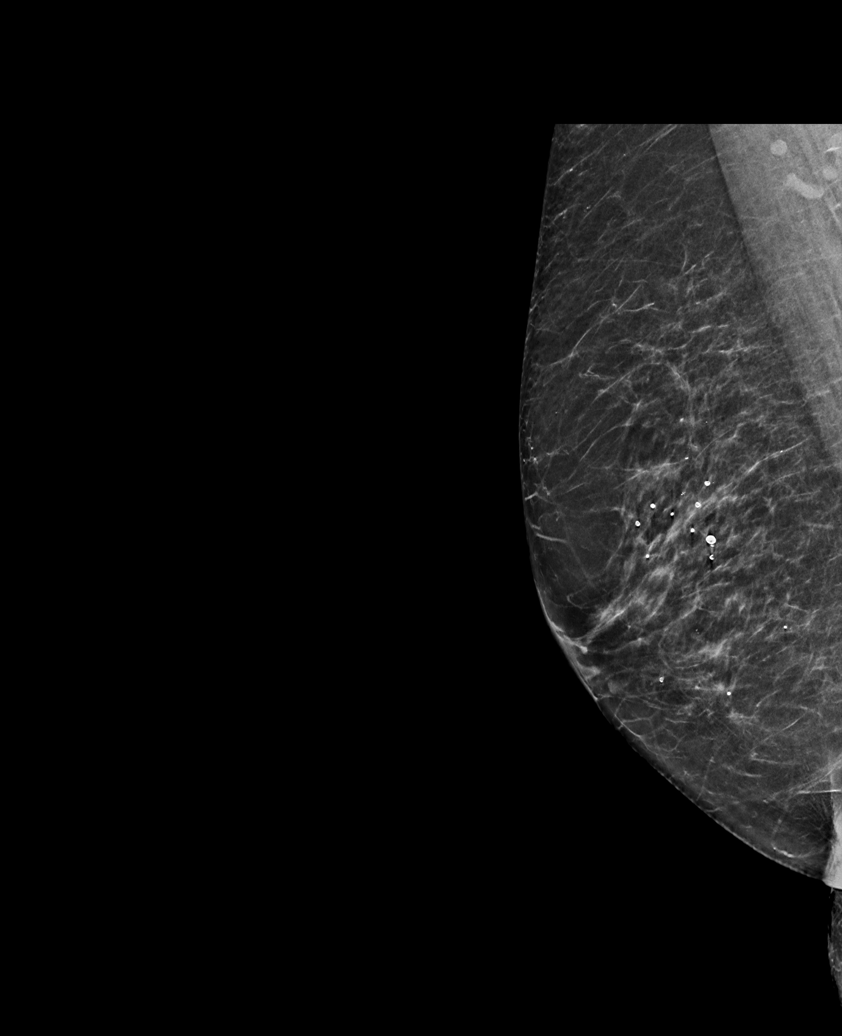

[R CC synth-2D (1 of 2)]
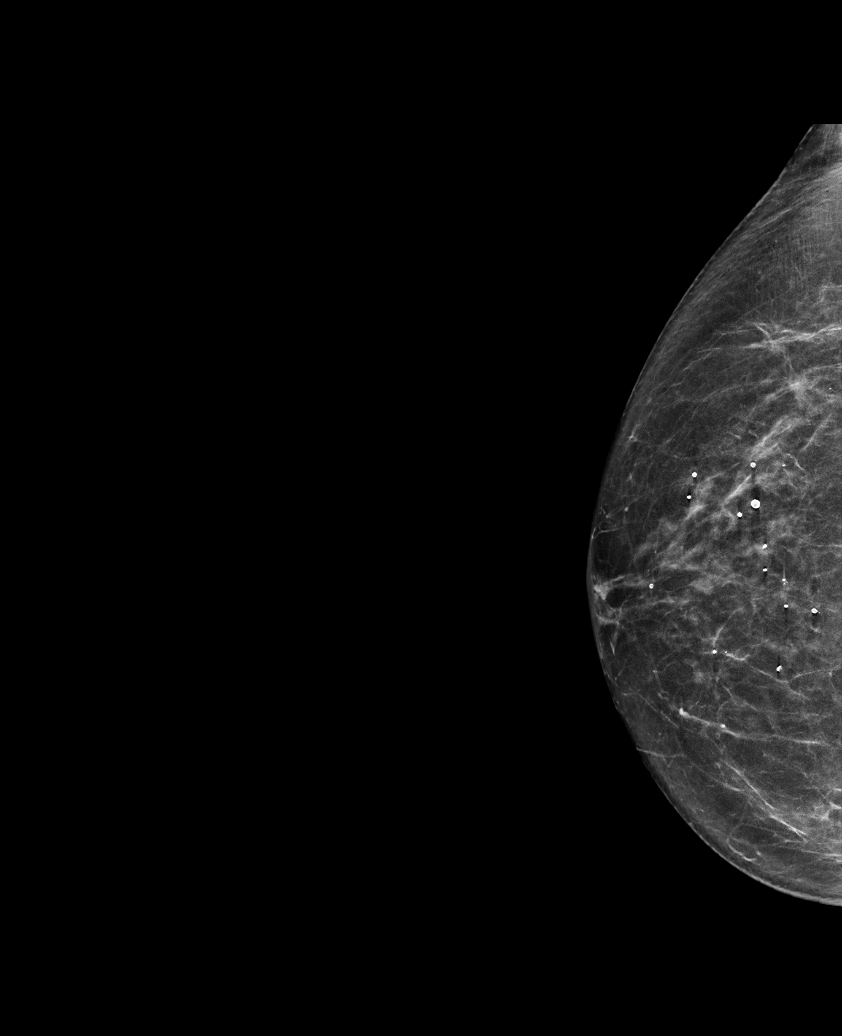

[R CC synth-2D (2 of 2)]
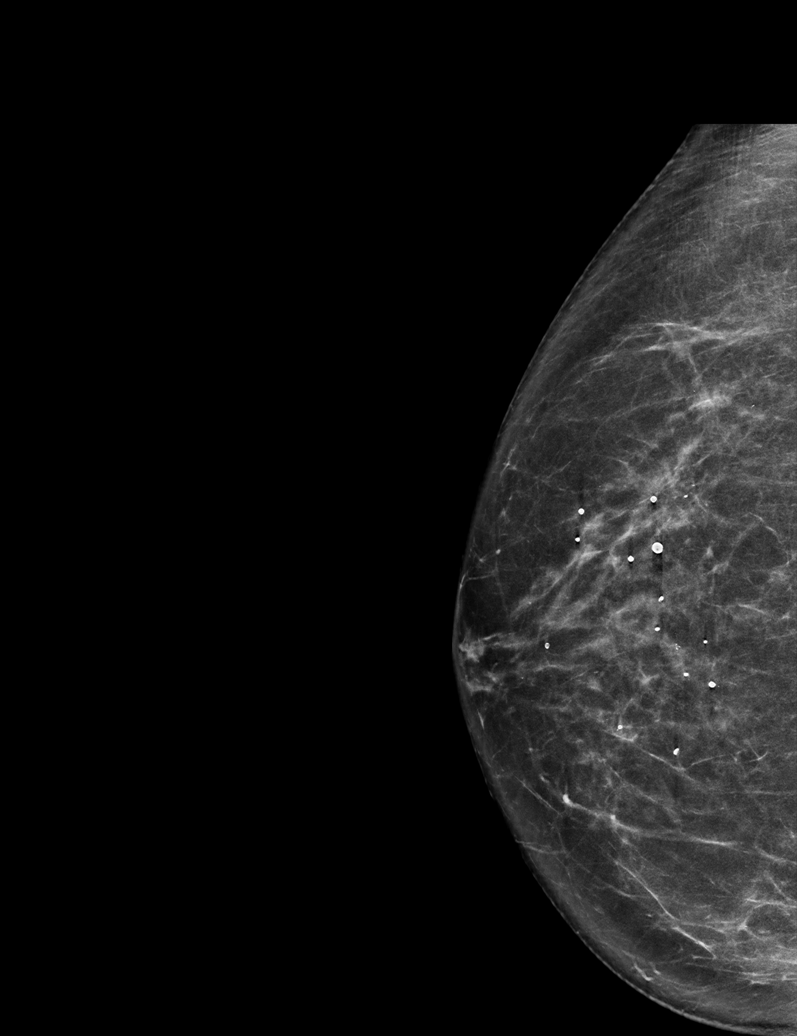

[R CC tomo · tomo slice 34/67.0]
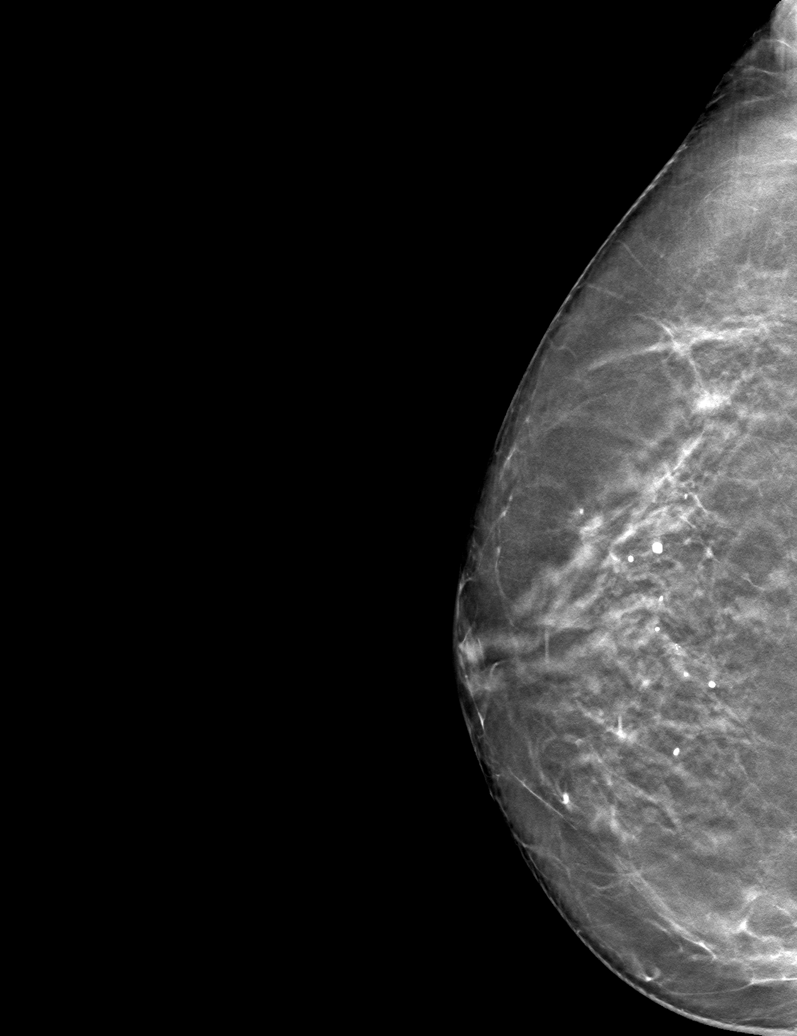

[6 of 30 positions shown; findings below may reference images not displayed]

ACR Breast Density Category b: There are scattered areas of
fibroglandular density.
FINDINGS: There are no findings suspicious for malignancy. The images were
evaluated with computer-aided detection.
IMPRESSION: No mammographic evidence of malignancy. A result letter of this
screening mammogram will be mailed directly to the patient.

RECOMMENDATION:
Screening mammogram in one year. (Code:[id number]-[id number])

BI-RADS CATEGORY  1: Negative.

## 2021-04-01 ENCOUNTER — Other Ambulatory Visit: Payer: Self-pay | Admitting: Cardiovascular Disease

## 2021-04-10 ENCOUNTER — Other Ambulatory Visit: Payer: Self-pay | Admitting: Cardiovascular Disease

## 2021-08-22 ENCOUNTER — Other Ambulatory Visit: Payer: Self-pay

## 2021-08-22 ENCOUNTER — Other Ambulatory Visit: Payer: Self-pay | Admitting: Cardiovascular Disease

## 2021-08-22 ENCOUNTER — Emergency Department (HOSPITAL_COMMUNITY)
Admission: EM | Admit: 2021-08-22 | Discharge: 2021-08-22 | Discharge status: Against Medical Advice | Payer: Medicare Other | Attending: Physician Assistant | Admitting: Physician Assistant

## 2021-08-22 ENCOUNTER — Emergency Department (HOSPITAL_COMMUNITY): Payer: Medicare Other

## 2021-08-22 DIAGNOSIS — W010XXA Fall on same level from slipping, tripping and stumbling without subsequent striking against object, initial encounter: Secondary | ICD-10-CM | POA: Diagnosis not present

## 2021-08-22 DIAGNOSIS — S0083XA Contusion of other part of head, initial encounter: Secondary | ICD-10-CM | POA: Insufficient documentation

## 2021-08-22 DIAGNOSIS — S0990XA Unspecified injury of head, initial encounter: Secondary | ICD-10-CM | POA: Diagnosis present

## 2021-08-22 DIAGNOSIS — Z5321 Procedure and treatment not carried out due to patient leaving prior to being seen by health care provider: Secondary | ICD-10-CM | POA: Diagnosis not present

## 2021-08-22 NOTE — ED Provider Notes (Cosign Needed)
Emergency Medicine Provider Triage Evaluation Note  Melissa Gonzales , a 80 y.o. female  was evaluated in triage.  Pt complains of bruising to the face. States she had a mechanical fall a few days ago and hit head. Woke up with bruising the next day.  Review of Systems  Positive: Facial bruising Negative: Neck pain  Physical Exam  BP 130/68 (BP Location: Right Arm)   Pulse 68   Temp 98.6 F (37 C) (Oral)   Resp 16   SpO2 95%  Gen:   Awake, no distress   Resp:  Normal effort  MSK:   Moves extremities without difficulty  Other:  Ecchymosis to the bilat eyes and right forehead, subconjunctival hemorrhage noted  Medical Decision Making  Medically screening exam initiated at 1:31 PM.  Appropriate orders placed.  Teofilo Pod was informed that the remainder of the evaluation will be completed by another provider, this initial triage assessment does not replace that evaluation, and the importance of remaining in the ED until their evaluation is complete.     Karrie Meres, [place] 08/22/21 1333

## 2021-08-22 NOTE — ED Triage Notes (Signed)
Patient arrives with complaints eye swelling/redness/bruising after a fall 4 days ago (10/13). States she tripped over own feet.  Patient initially felt fine on the first day with small bruise on right side of forehead. She developed worsening eye bruising over the next few days.   Patient lives alone and was brought in by neighbor.

## 2021-08-22 NOTE — ED Notes (Signed)
Pt left AMA °

## 2021-11-16 ENCOUNTER — Ambulatory Visit: Payer: Medicare Other | Admitting: Podiatry

## 2021-11-16 ENCOUNTER — Encounter: Payer: Self-pay | Admitting: Podiatry

## 2021-11-16 ENCOUNTER — Other Ambulatory Visit: Payer: Self-pay

## 2021-11-16 DIAGNOSIS — M79675 Pain in left toe(s): Secondary | ICD-10-CM

## 2021-11-16 DIAGNOSIS — B351 Tinea unguium: Secondary | ICD-10-CM

## 2021-11-16 DIAGNOSIS — L84 Corns and callosities: Secondary | ICD-10-CM

## 2021-11-16 DIAGNOSIS — M79674 Pain in right toe(s): Secondary | ICD-10-CM | POA: Diagnosis not present

## 2021-11-16 NOTE — Progress Notes (Signed)
°  Subjective:  Patient ID: Melissa Gonzales, female    DOB: [birth year],   MRN: [id number]  Chief Complaint  Patient presents with   Nail Problem     Lt hallux toenail is loose, and pt needs toenails trimmed    81 y.o. female presents for concern of left hallux nail that is loose. Denies any pain. Does relates that she needs her toenails and calluses trimmed . Denies any other pedal complaints. Denies n/v/f/c.   Past Medical History:  Diagnosis Date   Anxiety    Arthritis    Depression    Diverticulosis    GERD (gastroesophageal reflux disease)    Glaucoma    Bilateral eyes   Hyperlipidemia    Hypertension    Peripheral neuropathy    Pneumonia    hx of   Urgency of urination     Objective:  Physical Exam: Vascular: DP/PT pulses 2/4 bilateral. CFT <3 seconds. Normal hair growth on digits. No edema.  Skin. No lacerations or abrasions bilateral feet. Nails 1-5 are thickened discolored and elongated with subungual debris.  Hyperkeratotic lesion noted to medial right second digit.  Musculoskeletal: MMT 5/5 bilateral lower extremities in DF, PF, Inversion and Eversion. Deceased ROM in DF of ankle joint.  Neurological: Sensation intact to light touch.   Assessment:   1. Pain due to onychomycosis of toenails of both feet   2. Corns and callosities      Plan:  Patient was evaluated and treated and all questions answered. -Discussed and educated patient on  foot care, especially with  regards to the vascular, neurological and musculoskeletal systems.  -Discussed supportive shoes at all times and checking feet regularly.  -Mechanically debrided all nails 1-5 bilateral using sterile nail nipper and filed with dremel without incident  -Hyperkeratotic tissue debrided.  -Answered all patient questions -Patient to return  as needed.  -Patient advised to call the office if any problems or questions arise in the meantime.   Louann Sjogren, DPM

## 2021-11-22 ENCOUNTER — Other Ambulatory Visit: Payer: Self-pay | Admitting: Cardiovascular Disease

## 2021-12-08 ENCOUNTER — Other Ambulatory Visit: Payer: Self-pay | Admitting: Internal Medicine

## 2021-12-08 DIAGNOSIS — Z1231 Encounter for screening mammogram for malignant neoplasm of breast: Secondary | ICD-10-CM

## 2021-12-24 ENCOUNTER — Other Ambulatory Visit: Payer: Self-pay | Admitting: Cardiovascular Disease

## 2021-12-26 NOTE — Telephone Encounter (Signed)
Rx(s) sent to pharmacy electronically.  

## 2021-12-27 LAB — COLOGUARD: COLOGUARD: POSITIVE — AB

## 2022-01-05 ENCOUNTER — Ambulatory Visit
Admission: RE | Admit: 2022-01-05 | Discharge: 2022-01-05 | Disposition: A | Discharge status: Home / Self Care | Payer: Medicare Other | Source: Ambulatory Visit | Attending: Internal Medicine | Admitting: Internal Medicine

## 2022-01-05 DIAGNOSIS — Z1231 Encounter for screening mammogram for malignant neoplasm of breast: Secondary | ICD-10-CM

## 2022-02-05 NOTE — Progress Notes (Signed)
? ?Office Visit  ?  ?Patient Name: Melissa Gonzales ?Date of Encounter: 02/06/2022 ? ?PCP:  Rodrigo RanPerini, Mark, MD ?  ? [facility]  ?Cardiologist:  Chilton Siiffany Woodlands, MD  ?Advanced Practice Provider:  No care team member to display ?Electrophysiologist:  None  ?   ? ?Chief Complaint  ?  ?Melissa Gonzales is a 81 y.o. 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   ? ?Past Medical History  ?  ?Past Medical History:  ?Diagnosis Date  ? Anxiety   ? Arthritis   ? Depression   ? Diverticulosis   ? GERD (gastroesophageal reflux disease)   ? Glaucoma   ? Bilateral eyes  ? Hyperlipidemia   ? Hypertension   ? Peripheral neuropathy   ? Pneumonia   ? hx of  ? Urgency of urination   ? ?Past Surgical History:  ?Procedure Laterality Date  ? BACK SURGERY    ? CHOLECYSTECTOMY  1993  ? COLONOSCOPY    ? CORONARY ARTERY BYPASS GRAFT N/A 12/19/2018  ? Procedure: CORONARY ARTERY BYPASS GRAFTING (CABG)x3, using left internal mammary arteryand right and left greater saphenous veins harvested endoscopically;  Surgeon: Kerin PernaVan Trigt, Peter, MD;  [facility];  Service: Open Heart Surgery;  Laterality: N/A;  ? EYE SURGERY    ? Cataract Surgery Bilateral, 5 years ago  ? LEFT HEART CATH AND CORONARY ANGIOGRAPHY N/A 12/17/2018  ? Procedure: LEFT HEART CATH AND CORONARY ANGIOGRAPHY;  Surgeon: Lennette BihariKelly, Thomas A, MD;  [facility];  Service: Cardiovascular;  Laterality: N/A;  ? MAXIMUM ACCESS (MAS)POSTERIOR LUMBAR INTERBODY FUSION (PLIF) 1 LEVEL N/A 03/17/2015  ? Procedure: Lumbar five -sacral one Maximum access posterior lumbar interbody fusion/Lumbar two-three  Laminectomy and extension of instrumentation to Sacral-one;  Surgeon: Tia Alertavid S Jones, MD;  Location: [facility] NEURO ORS;  Service: Neurosurgery;  Laterality: N/A;  ? MAXIMUM ACCESS (MAS)POSTERIOR LUMBAR INTERBODY FUSION (PLIF) 2 LEVEL N/A 11/27/2014  ? Procedure: LUMBAR  THREE-FOUR, LUMBAR FOUR-FIVE MAXIMUM ACCESS POSTERIOR LUMBAR INTERBODY FUSION;  Surgeon: Tia Alertavid S Jones, MD;  Location: [facility] NE[facility] ORS;  Service: Neurosurgery;  Laterality: N/A;  L3-4 L4-5 MAXIMUM ACCESS POSTERIOR LUMBAR INTERBODY FUSION  ? TEE WITHOUT CARDIOVERSION N/A 12/19/2018  ? Procedure: TRANSESOPHAGEAL ECHOCARDIOGRAM (TEE);  Surgeon: Donata ClayVan Trigt, Theron AristaPeter, MD;  [facility];  Service: Open Heart Surgery;  Laterality: N/A;  ? VAGINAL HYSTERECTOMY  1970s  ? uterine prolapse  ? ? ?Allergies ? ?Allergies  ?Allergen Reactions  ? Aspirin Other (See Comments)  ? Atorvastatin Other (See Comments)  ? Bupropion Other (See Comments)  ? Other Other (See Comments)  ? Teriparatide Other (See Comments)  ? ? ?History of Present Illness  ?  ?Melissa Gonzales is a 81 y.o. 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 ? ?Presented with NSTEMI 12/2018 and subsequently underwent 3-vessel CABG. She was started on HCTZ postoperatively due to poorly controlled hypertension. Subsequently Carvedilol has been increased and Lisinopril initiated. Atorvastatin had to be discontinued due to transaminitis which resolved and she has tolerated low dose Rosuvastatin. Follows with [facility] due to venous insufficiency.  When seen 05/2020 her HCTZ was transitioned to Lasix. When last seen 02/2021 via video visit she was doing well and having success with weight loss. ? ?She presents today for follow up.  She reports that she has been doing well since last being seen a year ago.  She is walking and performing chair yoga for exercise however this has been limited due to increased arthritis in her lower extremities.  She is tolerating her medications well she does complain of some dizziness due to vestibular issue but does not endorse any dizziness with rising from sitting position.  Blood pressure today was 110/40 and with  recheck was 98/44.  She is asymptomatic with her blood pressure currently.  She is not on any particular diet but does watch her salt and sugar intake.  She denies chest pain, SOB, dizziness, orthopnea, syncope,vomiting, dizziness, edema, weight gain, or early satiety. ? ?EKGs/Labs/Other Studies Reviewed:  ? ?The following studies were reviewed today: ?LHC 12/17/18: ?Ost RCA to Mid RCA lesion is 100% stenosed. ?Prox Cx lesion is 40% stenosed. ?Mid Cx to Dist Cx lesion is 85% stenosed. ?RPDA lesion is 95% stenosed. ?Ost RPDA to RPDA lesion is 90% stenosed. ?Dist LM to Ost LAD lesion is 35% stenosed. ?Prox LAD lesion is 70% stenosed. ?Prox LAD to Mid LAD lesion is 90% stenosed. ?  ?Echo 12/15/18: ?  ? 1. The left ventricle has hyperdynamic systolic function of [id number]. The cavity size was normal. There is mildly increased left ventricular wall thickness. Echo evidence of impaired diastolic relaxation Elevated left ventricular end-diastolic pressure The  ?E/e' is 15.6. ? 2. The right ventricle has normal systolic function. The cavity was normal. There is no increase in right ventricular wall thickness. ? 3. The mitral valve is normal in structure. There is mild mitral annular calcification present. ? 4. The tricuspid valve is normal in structure.Tricuspid valve regurgitation is mild by color flow Doppler. ? 5. The aortic valve is tricuspid Aortic valve regurgitation is moderate by color flow Doppler. ? 6. The pulmonic valve was normal in structure. ? ?EKG:  EKG is ordered today.  The ekg ordered today demonstrates normal sinus rhythm ? ?Recent Labs: ?No results found for requested labs within last 8760 hours.  ?Recent Lipid Panel ?   ?Component Value Date/Time  ? CHOL 123 05/14/2020 1154  ? TRIG 80 05/14/2020 1154  ? HDL 51 05/14/2020 1154  ? CHOLHDL 2.4 05/14/2020 1154  ? CHOLHDL 3.1 12/15/2018 0259  ? VLDL 23 12/15/2018 0259  ? LDLCALC 56 05/14/2020 1154  ? LDLDIRECT 118 (H) 06/02/2008 2046  ? ? ? ?Home Medications   ? ?Current Meds  ?Medication Sig  ? Abaloparatide (TYMLOS) 3120 MCG/1.56ML SOPN 80 mcg once a day in the a.m. as directed-patient needs 90 day supply  ? ALPHAGAN P 0.1 % SOLN Place 1 drop into both eyes every 12 (twelve) hours.   ? ALPRAZolam (XANAX) 0.25 MG tablet TAKE 1 TABLET BY MOUTH TWICE DAILY AS NEEDED FOR ANXIETY  ? ARIPiprazole (ABILIFY) 5 MG tablet Take 5 mg by mouth daily.  ? aspirin 81 MG chewable tablet Chew 81 mg by mouth daily.  ? Calcium Carb-Cholecalciferol (CALCIUM 600 + D PO) Take 1 tablet by mouth daily.   ? carvedilol (COREG) 25 MG tablet TAKE 1 TABLET(25 MG) BY MOUTH TWICE DAILY  ? dorzolamide-timolol (COSOPT) 22.3-6.8 MG/ML ophthalmic solution Place 1 drop into both eyes 2 (two) times daily.   ? ELMIRON 100 MG capsule Take 100 mg by mouth 3 (three) times daily.  ? escitalopram (LEXAPRO) 20 MG tablet Take 20 mg by mouth daily.   ? esomeprazole (NEXIUM) 40 MG capsule Take 1 capsule (40 mg total) by mouth daily  before breakfast.  ? ezetimibe (ZETIA) 10 MG tablet TAKE 1 TABLET(10 MG) BY MOUTH DAILY  ? Fluocinolone Acetonide Scalp 0.01 % OIL   ? levothyroxine (SYNTHROID, LEVOTHROID) 25 MCG tablet Take 1 tablet (25 mcg total) by mouth daily at 6 (six) AM.  ? lisinopril (ZESTRIL) 20 MG tablet TAKE 1 TABLET(20 MG) BY MOUTH IN THE MORNING AND AT BEDTIME  ? loratadine (CLARITIN) 10 MG tablet Take 10 mg by mouth daily as needed.   ? Meth-Hyo-M Bl-Na Phos-Ph Sal (URIBEL) 118 MG CAPS Take 1 capsule by mouth 2 (two) times daily as needed (for pain).   ? Multiple Vitamins-Minerals (MULTIVITAMIN WITH MINERALS) tablet Take 1 tablet by mouth daily.  ? naproxen (NAPROSYN) 250 MG tablet Take 250 mg by mouth as needed.  ? Netarsudil-Latanoprost 0.02-0.005 % SOLN Place 1 drop into both eyes at bedtime.  ? nitrofurantoin (MACRODANTIN) 100 MG capsule Take 1 capsule by mouth at bedtime.  ? rosuvastatin (CRESTOR) 20 MG tablet TAKE 1 TABLET(20 MG) BY MOUTH DAILY  ? triamcinolone cream (KENALOG) 0.1 %   ? vitamin B-12  (CYANOCOBALAMIN) 100 MCG tablet Take 100 mcg by mouth daily.  ? [DISCONTINUED] furosemide (LASIX) 40 MG tablet Take 1 tablet (40 mg total) by mouth daily. Patient is overdue for follow up appointment, please call our offi

## 2022-02-06 ENCOUNTER — Encounter (HOSPITAL_BASED_OUTPATIENT_CLINIC_OR_DEPARTMENT_OTHER): Payer: Self-pay | Admitting: Nurse Practitioner

## 2022-02-06 ENCOUNTER — Ambulatory Visit (HOSPITAL_BASED_OUTPATIENT_CLINIC_OR_DEPARTMENT_OTHER): Payer: Medicare Other | Admitting: Nurse Practitioner

## 2022-02-06 VITALS — BP 110/40 | HR 62 | Ht 63.0 in | Wt 145.7 lb

## 2022-02-06 DIAGNOSIS — Z951 Presence of aortocoronary bypass graft: Secondary | ICD-10-CM | POA: Diagnosis not present

## 2022-02-06 DIAGNOSIS — I10 Essential (primary) hypertension: Secondary | ICD-10-CM | POA: Diagnosis not present

## 2022-02-06 DIAGNOSIS — E785 Hyperlipidemia, unspecified: Secondary | ICD-10-CM | POA: Diagnosis not present

## 2022-02-06 DIAGNOSIS — I872 Venous insufficiency (chronic) (peripheral): Secondary | ICD-10-CM

## 2022-02-06 MED ORDER — FUROSEMIDE 20 MG PO TABS: 20.0000 mg | ORAL_TABLET | Freq: Every day | ORAL | 3 refills | Status: DC

## 2022-02-06 NOTE — Patient Instructions (Signed)
Medication Instructions:  ?Your physician has recommended you make the following change in your medication:  ? ?Change: Lasix 20mg  daily  ? ?Continue: Carvedilol  ? ?*If you need a refill on your cardiac medications before your next appointment, please call your pharmacy* ? ?Follow-Up: ?At [facility], you and your health needs are our priority.  As part of our continuing mission to provide you with exceptional heart care, we have created designated Provider Care Teams.  These Care Teams include your primary Cardiologist (physician) and Advanced Practice Providers (APPs -  Physician Assistants and Nurse Practitioners) who all work together to provide you with the care you need, when you need it. ? ?We recommend signing up for the patient portal called "MyChart".  Sign up information is provided on this After Visit Summary.  MyChart is used to connect with patients for Virtual Visits (Telemedicine).  Patients are able to view lab/test results, encounter notes, upcoming appointments, etc.  Non-urgent messages can be sent to your provider as well.   ?To learn more about what you can do with MyChart, go to [email or web address].   ? ?Your next appointment:   ?1 year(s) ? ?The format for your next appointment:   ?In Person ? ?Provider:   ?Skeet Latch, MD or Laurann Montana, NP{ ? ?

## 2022-02-13 ENCOUNTER — Telehealth: Payer: Self-pay | Admitting: *Deleted

## 2022-02-13 NOTE — Telephone Encounter (Signed)
Pt. Scheduled for ov per protocol 03/08/22 @ 930 am,pre-visit and procedure cancelled. ?

## 2022-02-20 ENCOUNTER — Other Ambulatory Visit: Payer: Self-pay | Admitting: Cardiovascular Disease

## 2022-02-24 ENCOUNTER — Encounter: Payer: Medicare Other | Admitting: Gastroenterology

## 2022-03-08 ENCOUNTER — Encounter: Payer: Self-pay | Admitting: Gastroenterology

## 2022-03-08 ENCOUNTER — Ambulatory Visit: Payer: Medicare Other | Admitting: Gastroenterology

## 2022-03-08 VITALS — BP 110/52 | HR 63 | Ht 63.0 in | Wt 143.0 lb

## 2022-03-08 DIAGNOSIS — R195 Other fecal abnormalities: Secondary | ICD-10-CM

## 2022-03-08 MED ORDER — NA SULFATE-K SULFATE-MG SULF 17.5-3.13-1.6 GM/177ML PO SOLN: 1.0000 | ORAL | 0 refills | Status: DC

## 2022-03-08 NOTE — Progress Notes (Signed)
? ? ?HPI: ?This is a very pleasant 81 year old woman who was referred to me by Rodrigo Ran, MD  to evaluate positive colon cancer screening test, Cologuard.   ? ?She underwent colon cancer screening by her primary care physician 2 months ago using a Cologuard and it was positive.  She was 81 years old at the time.  She will be 81 next month. ? ?Blood work 11/2021 hemoglobin normal, she is not anemic. ? ?She has had no GI symptoms.  Specifically no bleeding, no changes in her bowels, no serious abdominal pains. ? ?Colon cancer does not run in her family ? ?Her weight is overall stable ? ?She walks with a walker when she is out but she can get around the house without a walker. ? ?She has had 2 or 3 colonoscopies in the [place] area.  She does not exactly member when her last one was but she tells me she has never had polyps or cancers on any of them. ? ? ? ?Review of systems: ?Pertinent positive and negative review of systems were noted in the above HPI section. All other review negative. ? ? ?Past Medical History:  ?Diagnosis Date  ? Anxiety   ? Arthritis   ? CAD (coronary artery disease)   ? Depression   ? Diverticulosis   ? Gallstones   ? GERD (gastroesophageal reflux disease)   ? Glaucoma   ? Bilateral eyes  ? History of UTI   ? Hyperlipidemia   ? Hypertension   ? Interstitial cystitis   ? Peripheral neuropathy   ? Pneumonia   ? hx of  ? Urgency of urination   ? ? ?Past Surgical History:  ?Procedure Laterality Date  ? BACK SURGERY    ? CHOLECYSTECTOMY  1993  ? COLONOSCOPY    ? CORONARY ARTERY BYPASS GRAFT N/A 12/19/2018  ? Procedure: CORONARY ARTERY BYPASS GRAFTING (CABG)x3, using left internal mammary arteryand right and left greater saphenous veins harvested endoscopically;  Surgeon: Kerin Perna, MD;  Location: [facility] OR;  Service: Open Heart Surgery;  Laterality: N/A;  ? EYE SURGERY    ? Cataract Surgery Bilateral, 5 years ago  ? LEFT HEART CATH AND CORONARY ANGIOGRAPHY N/A 12/17/2018  ? Procedure: LEFT  HEART CATH AND CORONARY ANGIOGRAPHY;  Surgeon: Lennette Bihari, MD;  Location: [facility] INVASIVE CV LAB;  Service: Cardiovascular;  Laterality: N/A;  ? MAXIMUM ACCESS (MAS)POSTERIOR LUMBAR INTERBODY FUSION (PLIF) 1 LEVEL N/A 03/17/2015  ? Procedure: Lumbar five -sacral one Maximum access posterior lumbar interbody fusion/Lumbar two-three  Laminectomy and extension of instrumentation to Sacral-one;  Surgeon: Tia Alert, MD;  Location: [facility] NEURO ORS;  Service: Neurosurgery;  Laterality: N/A;  ? MAXIMUM ACCESS (MAS)POSTERIOR LUMBAR INTERBODY FUSION (PLIF) 2 LEVEL N/A 11/27/2014  ? Procedure: LUMBAR THREE-FOUR, LUMBAR FOUR-FIVE MAXIMUM ACCESS POSTERIOR LUMBAR INTERBODY FUSION;  Surgeon: Tia Alert, MD;  Location: [facility] NE[facility] ORS;  Service: Neurosurgery;  Laterality: N/A;  L3-4 L4-5 MAXIMUM ACCESS POSTERIOR LUMBAR INTERBODY FUSION  ? TEE WITHOUT CARDIOVERSION N/A 12/19/2018  ? Procedure: TRANSESOPHAGEAL ECHOCARDIOGRAM (TEE);  Surgeon: Donata Clay, Theron Arista, MD;  Location: [facility] OR;  Service: Open Heart Surgery;  Laterality: N/A;  ? VAGINAL HYSTERECTOMY  1970s  ? uterine prolapse  ? ? ?Current Outpatient Medications  ?Medication Instructions  ? Abaloparatide (TYMLOS) 3120 MCG/1.56ML SOPN 80 mcg once a day in the a.m. as directed-patient needs 90 day supply  ? ALPHAGAN P 0.1 % SOLN 1 drop, Both Eyes, Every 12 hours  ? ALPRAZolam Prudy Feeler)  0.25 MG tablet TAKE 1 TABLET BY MOUTH TWICE DAILY AS NEEDED FOR ANXIETY  ? ARIPiprazole (ABILIFY) 5 mg, Oral, Daily  ? aspirin 81 mg, Oral, Daily  ? Calcium Carb-Cholecalciferol (CALCIUM 600 + D PO) 1 tablet, Oral, Daily  ? carvedilol (COREG) 25 MG tablet TAKE 1 TABLET(25 MG) BY MOUTH TWICE DAILY  ? dorzolamide-timolol (COSOPT) 22.3-6.8 MG/ML ophthalmic solution 1 drop, Both Eyes, 2 times daily  ? Elmiron 100 mg, Oral, 3 times daily  ? escitalopram (LEXAPRO) 20 mg, Oral, Daily  ? esomeprazole (NEXIUM) 40 mg, Oral, Daily before breakfast  ? ezetimibe (ZETIA) 10 MG tablet TAKE 1 TABLET(10 MG) BY MOUTH DAILY   ? Fluocinolone Acetonide Scalp 0.01 % OIL No dose, route, or frequency recorded.  ? furosemide (LASIX) 20 mg, Oral, Daily  ? latanoprost (XALATAN) 0.005 % ophthalmic solution 1 drop, Both Eyes, Daily  ? levothyroxine (SYNTHROID) 25 mcg, Oral, Daily  ? lisinopril (ZESTRIL) 20 MG tablet TAKE 1 TABLET(20 MG) BY MOUTH IN THE MORNING AND AT BEDTIME  ? loratadine (CLARITIN) 10 mg, Oral, Daily PRN  ? Meth-Hyo-M Bl-Na Phos-Ph Sal (URIBEL) 118 MG CAPS 1 capsule, Oral, 2 times daily PRN  ? Multiple Vitamins-Minerals (MULTIVITAMIN WITH MINERALS) tablet 1 tablet, Oral, Daily  ? naproxen (NAPROSYN) 250 mg, Oral, As needed  ? nitrofurantoin (MACRODANTIN) 100 MG capsule 1 capsule, Oral, Daily at bedtime  ? rosuvastatin (CRESTOR) 20 MG tablet TAKE 1 TABLET(20 MG) BY MOUTH DAILY  ? triamcinolone cream (KENALOG) 0.1 % As needed  ? vitamin B-12 (CYANOCOBALAMIN) 100 mcg, Oral, Daily  ? ? ?Allergies as of 03/08/2022  ? (No Known Allergies)  ? ? ?Family History  ?Problem Relation Age of Onset  ? Heart disease Mother [id number]  ?     died CHF age 82  ? Cancer Father   ?     Primary Cell Liver Cancer  ? Hyperlipidemia Sister   ? Hypertension Sister   ? Breast cancer Daughter [id number]  ? Mental illness Daughter   ? Drug abuse Grandchild   ? Diabetes Neg Hx   ? Colon cancer Neg Hx   ? Stomach cancer Neg Hx   ? ? ?Social History  ? ?Socioeconomic History  ? Marital status: Widowed  ?  Spouse name: Not on file  ? Number of children: 3  ? Years of education: Not on file  ? Highest education level: Not on file  ?Occupational History  ?  Employer: RETIRED  ?Tobacco Use  ? Smoking status: Never  ? Smokeless tobacco: Never  ?Vaping Use  ? Vaping Use: Never used  ?Substance and Sexual Activity  ? Alcohol use: Yes  ?  Alcohol/week: 1.0 standard drink  ?  Types: 1 Glasses of wine per week  ?  Comment: 1-2 glass per night, sometimes none  ? Drug use: No  ? Sexual activity: Never  ?Other Topics Concern  ? Not on file  ?Social History Narrative  ? Husband died  suicide age 5140s.  Currently in long term romantic relationship.  Support from 2 daughters and good relationship with grandchildren.  Other daughter has mental health issues, strained relationship.  Pt feels she is not making good decisions re: children and has tried to get CPS involved without relief.  ? 11/09/11: Lucila MaineGrandson (of daughter with mental health issues) facing jail time for selling drugs.  Pt has tried to support him and offered help with rehab or with changing colleges, but he refuses.    ? ?Social Determinants of  Health  ? ?Financial Resource Strain: Not on file  ?Food Insecurity: Not on file  ?Transportation Needs: Not on file  ?Physical Activity: Not on file  ?Stress: Not on file  ?Social Connections: Not on file  ?Intimate Partner Violence: Not on file  ? ? ? ?Physical Exam: ?BP (!) 110/52   Pulse 63   Ht 5\' 3"  (1.6 m)   Wt 143 lb (64.9 kg)   SpO2 95%   BMI 25.33 kg/m?  ?Constitutional: generally well-appearing ?Psychiatric: alert and oriented x3 ?Eyes: extraocular movements intact ?Mouth: oral pharynx moist, no lesions ?Neck: supple no lymphadenopathy ?Cardiovascular: heart regular rate and rhythm ?Lungs: clear to auscultation bilaterally ?Abdomen: soft, nontender, nondistended, no obvious ascites, no peritoneal signs, normal bowel sounds ?Extremities: no lower extremity edema bilaterally ?Skin: no lesions on visible extremities ? ? ?Assessment and plan: ?81 y.o. female with Cologuard positive stool ? ?I recommended colonoscopy at her soonest convenience given her Cologuard positive stool.  She understands it is unlikely that she has underlying colon cancer.  She also understands that colon cancer screening test generally end between 75 and 80.   I see no reason for further blood tests or imaging studies prior to then ? ? ? ?Please see the "Patient Instructions" section for addition details about the plan. ? ? ?94, MD ?[facility] ?03/08/2022, 9:04 AM ? ?Cc: 05/08/2022,  MD ? ?Total time on date of encounter was 45  minutes (this included time spent preparing to see the patient reviewing records; obtaining and/or reviewing separately obtained history; performing a medically approp

## 2022-03-08 NOTE — Patient Instructions (Signed)
If you are age 82 or older, your body mass index should be between 23-30. Your Body mass index is 25.33 kg/m?Marland Kitchen If this is out of the aforementioned range listed, please consider follow up with your Primary Care Provider. ?________________________________________________________ ? ?[facility] providers would like to encourage you to use [facility] to communicate with providers for non-urgent requests or questions.  Due to long hold times on the telephone, sending your provider a message by [facility] may be a faster and more efficient way to get a response.  Please allow 48 business hours for a response.  Please remember that this is for non-urgent requests.  ?_______________________________________________________  ? ?You have been scheduled for a colonoscopy. Please follow written instructions given to you at your visit today.  ?Please pick up your prep supplies at the pharmacy within the next 1-3 days. ?If you use inhalers (even only as needed), please bring them with you on the day of your procedure. ? ?Due to recent changes in healthcare laws, you may see the results of your imaging and laboratory studies on MyChart before your provider has had a chance to review them.  We understand that in some cases there may be results that are confusing or concerning to you. Not all laboratory results come back in the same time frame and the provider may be waiting for multiple results in order to interpret others.  Please give [place] 48 hours in order for your provider to thoroughly review all the results before contacting the office for clarification of your results.  ? ?Thank you for entrusting me with your care and choosing [facility]. ? ?Dr Christella Hartigan ? ?

## 2022-03-22 ENCOUNTER — Other Ambulatory Visit: Payer: Self-pay | Admitting: Cardiovascular Disease

## 2022-03-22 NOTE — Telephone Encounter (Signed)
Rx(s) sent to pharmacy electronically.  

## 2022-03-23 ENCOUNTER — Other Ambulatory Visit: Payer: Self-pay | Admitting: Cardiovascular Disease

## 2022-03-23 NOTE — Telephone Encounter (Signed)
Rx(s) sent to pharmacy electronically.  

## 2022-04-28 ENCOUNTER — Encounter: Payer: Self-pay | Admitting: Gastroenterology

## 2022-05-05 ENCOUNTER — Ambulatory Visit (AMBULATORY_SURGERY_CENTER): Payer: Medicare Other | Admitting: Gastroenterology

## 2022-05-05 ENCOUNTER — Encounter: Payer: Self-pay | Admitting: Gastroenterology

## 2022-05-05 VITALS — BP 151/77 | HR 103 | Temp 98.7°F | Resp 19 | Ht 63.0 in | Wt 143.0 lb

## 2022-05-05 DIAGNOSIS — R195 Other fecal abnormalities: Secondary | ICD-10-CM | POA: Diagnosis not present

## 2022-05-05 DIAGNOSIS — K5730 Diverticulosis of large intestine without perforation or abscess without bleeding: Secondary | ICD-10-CM | POA: Diagnosis not present

## 2022-05-05 MED ORDER — SODIUM CHLORIDE 0.9 % IV SOLN
500.0000 mL | Freq: Once | INTRAVENOUS | Status: DC
Start: 2022-05-05 — End: 2022-05-05

## 2022-05-05 NOTE — Op Note (Signed)
[facility] Patient Name: Melissa Gonzales Procedure Date: 05/05/2022 1:07 PM MRN: [id number] Endoscopist: Rachael Fee , MD Age: 81 Referring MD:  Date of Birth: 13-Jul-1941 Gender: Female Account #: [id number] Procedure:                Colonoscopy Indications:              Positive Cologuard test Medicines:                Monitored Anesthesia Care Procedure:                Pre-Anesthesia Assessment:                           - Prior to the procedure, a History and Physical                            was performed, and patient medications and                            allergies were reviewed. The patient's tolerance of                            previous anesthesia was also reviewed. The risks                            and benefits of the procedure and the sedation                            options and risks were discussed with the patient.                            All questions were answered, and informed consent                            was obtained. Prior Anticoagulants: The patient has                            taken no previous anticoagulant or antiplatelet                            agents. ASA Grade Assessment: III - A patient with                            severe systemic disease. After reviewing the risks                            and benefits, the patient was deemed in                            satisfactory condition to undergo the procedure.                           After obtaining informed consent, the colonoscope  was passed under direct vision. Throughout the                            procedure, the patient's blood pressure, pulse, and                            oxygen saturations were monitored continuously. The                            Olympus Colonoscope [id number] was introduced through                            the anus and advanced to the the cecum, identified                            by appendiceal orifice and  ileocecal valve. The                            colonoscopy was performed without difficulty. The                            patient tolerated the procedure well. The quality                            of the bowel preparation was good. The ileocecal                            valve, appendiceal orifice, and rectum were                            photographed. Scope In: 1:12:30 PM Scope Out: 1:32:04 PM Scope Withdrawal Time: 0 hours 7 minutes 40 seconds  Total Procedure Duration: 0 hours 19 minutes 34 seconds  Findings:                 Multiple small and large-mouthed diverticula were                            found in the left colon.                           The exam was otherwise without abnormality on                            direct and retroflexion views. Complications:            No immediate complications. Estimated blood loss:                            None. Estimated Blood Loss:     Estimated blood loss: none. Impression:               - Diverticulosis in the left colon.                           - The examination was otherwise normal on direct  and retroflexion views.                           - No polyps or cancers. Recommendation:           - Patient has a contact number available for                            emergencies. The signs and symptoms of potential                            delayed complications were discussed with the                            patient. Return to normal activities tomorrow.                            Written discharge instructions were provided to the                            patient.                           - Resume previous diet.                           - Continue present medications.                           - You do not need any further colon cancer                            screening tests (including stool testing). These                            types of tests generally stop around age  80-80. Rachael Fee, MD 05/05/2022 1:35:25 PM This report has been signed electronically.

## 2022-05-05 NOTE — Patient Instructions (Signed)
YOU HAD AN ENDOSCOPIC PROCEDURE TODAY AT THE [facility]:   Refer to the procedure report that was given to you for any specific questions about what was found during the examination.  If the procedure report does not answer your questions, please call your gastroenterologist to clarify.  If you requested that your care partner not be given the details of your procedure findings, then the procedure report has been included in a sealed envelope for you to review at your convenience later.  YOU SHOULD EXPECT: Some feelings of bloating in the abdomen. Passage of more gas than usual.  Walking can help get rid of the air that was put into your GI tract during the procedure and reduce the bloating. If you had a lower endoscopy (such as a colonoscopy or flexible sigmoidoscopy) you may notice spotting of blood in your stool or on the toilet paper. If you underwent a bowel prep for your procedure, you may not have a normal bowel movement for a few days.  Please Note:  You might notice some irritation and congestion in your nose or some drainage.  This is from the oxygen used during your procedure.  There is no need for concern and it should clear up in a day or so.  SYMPTOMS TO REPORT IMMEDIATELY:  Following lower endoscopy (colonoscopy or flexible sigmoidoscopy):  Excessive amounts of blood in the stool  Significant tenderness or worsening of abdominal pains  Swelling of the abdomen that is new, acute  Fever of 100F or higher  For urgent or emergent issues, a gastroenterologist can be reached at any hour by calling [phone]. Do not use MyChart messaging for urgent concerns.    DIET:  We do recommend a small meal at first, but then you may proceed to your regular diet.  Drink plenty of fluids but you should avoid alcoholic beverages for 24 hours.  ACTIVITY:  You should plan to take it easy for the rest of today and you should NOT DRIVE or use heavy machinery until tomorrow (because of  the sedation medicines used during the test).    FOLLOW UP: Our staff will call the number listed on your records the next business day following your procedure.  We will call around 7:15- 8:00 am to check on you and address any questions or concerns that you may have regarding the information given to you following your procedure. If we do not reach you, we will leave a message.  If you develop any symptoms (ie: fever, flu-like symptoms, shortness of breath, cough etc.) before then, please call [phone].  If you test positive for Covid 19 in the 2 weeks post procedure, please call and report this information to us.    If any biopsies were taken you will be contacted by phone or by letter within the next 1-3 weeks.  Please call us at [phone] if you have not heard about the biopsies in 3 weeks.    SIGNATURES/CONFIDENTIALITY: You and/or your care partner have signed paperwork which will be entered into your electronic medical record.  These signatures attest to the fact that that the information above on your After Visit Summary has been reviewed and is understood.  Full responsibility of the confidentiality of this discharge information lies with you and/or your care-partner.  

## 2022-05-05 NOTE — Progress Notes (Signed)
Pt's states no medical or surgical changes since previsit or office visit. VS assessed by C.W 

## 2022-05-05 NOTE — Progress Notes (Signed)
HPI: This is a woman with + cologuard testing   ROS: complete GI ROS as described in HPI, all other review negative.  Constitutional:  No unintentional weight loss   Past Medical History:  Diagnosis Date   Anxiety    Arthritis    CAD (coronary artery disease)    Depression    Diverticulosis    Gallstones    GERD (gastroesophageal reflux disease)    Glaucoma    Bilateral eyes   History of UTI    Hyperlipidemia    Hypertension    Interstitial cystitis    Peripheral neuropathy    Pneumonia    hx of   Urgency of urination     Past Surgical History:  Procedure Laterality Date   BACK SURGERY     CHOLECYSTECTOMY  1993   COLONOSCOPY     CORONARY ARTERY BYPASS GRAFT N/A 12/19/2018   Procedure: CORONARY ARTERY BYPASS GRAFTING (CABG)x3, using left internal mammary arteryand right and left greater saphenous veins harvested endoscopically;  Surgeon: Kerin Perna, MD;  Location: [facility] OR;  Service: Open Heart Surgery;  Laterality: N/A;   EYE SURGERY     Cataract Surgery Bilateral, 5 years ago   LEFT HEART CATH AND CORONARY ANGIOGRAPHY N/A 12/17/2018   Procedure: LEFT HEART CATH AND CORONARY ANGIOGRAPHY;  Surgeon: Lennette Bihari, MD;  Location: [facility];  Service: Cardiovascular;  Laterality: N/A;   MAXIMUM ACCESS (MAS)POSTERIOR LUMBAR INTERBODY FUSION (PLIF) 1 LEVEL N/A 03/17/2015   Procedure: Lumbar five -sacral one Maximum access posterior lumbar interbody fusion/Lumbar two-three  Laminectomy and extension of instrumentation to Sacral-one;  Surgeon: Tia Alert, MD;  Location: [facility];  Service: Neurosurgery;  Laterality: N/A;   MAXIMUM ACCESS (MAS)POSTERIOR LUMBAR INTERBODY FUSION (PLIF) 2 LEVEL N/A 11/27/2014   Procedure: LUMBAR THREE-FOUR, LUMBAR FOUR-FIVE MAXIMUM ACCESS POSTERIOR LUMBAR INTERBODY FUSION;  Surgeon: Tia Alert, MD;  Location: [facility];  Service: Neurosurgery;  Laterality: N/A;  L3-4 L4-5 MAXIMUM ACCESS POSTERIOR LUMBAR INTERBODY FUSION   TEE  WITHOUT CARDIOVERSION N/A 12/19/2018   Procedure: TRANSESOPHAGEAL ECHOCARDIOGRAM (TEE);  Surgeon: Donata Clay, Theron Arista, MD;  Location: [facility] OR;  Service: Open Heart Surgery;  Laterality: N/A;   VAGINAL HYSTERECTOMY  1970s   uterine prolapse    Current Outpatient Medications  Medication Sig Dispense Refill   Abaloparatide (TYMLOS) 3120 MCG/1.56ML SOPN 80 mcg once a day in the a.m. as directed-patient needs 90 day supply     ALPHAGAN P 0.1 % SOLN Place 1 drop into both eyes every 12 (twelve) hours.      ARIPiprazole (ABILIFY) 5 MG tablet Take 5 mg by mouth daily.     aspirin 81 MG chewable tablet Chew 81 mg by mouth daily.     Calcium Carb-Cholecalciferol (CALCIUM 600 + D PO) Take 1 tablet by mouth daily.      carvedilol (COREG) 25 MG tablet TAKE 1 TABLET(25 MG) BY MOUTH TWICE DAILY 180 tablet 3   dorzolamide-timolol (COSOPT) 22.3-6.8 MG/ML ophthalmic solution Place 1 drop into both eyes 2 (two) times daily.      escitalopram (LEXAPRO) 20 MG tablet Take 20 mg by mouth daily.      esomeprazole (NEXIUM) 40 MG capsule Take 1 capsule (40 mg total) by mouth daily before breakfast. 90 capsule 3   ezetimibe (ZETIA) 10 MG tablet TAKE 1 TABLET(10 MG) BY MOUTH DAILY 90 tablet 2   furosemide (LASIX) 20 MG tablet Take 1 tablet (20 mg total) by mouth daily. 90  tablet 3   latanoprost (XALATAN) 0.005 % ophthalmic solution Place 1 drop into both eyes daily.     levothyroxine (SYNTHROID, LEVOTHROID) 25 MCG tablet Take 1 tablet (25 mcg total) by mouth daily at 6 (six) AM. 30 tablet 1   lisinopril (ZESTRIL) 20 MG tablet TAKE 1 TABLET(20 MG) BY MOUTH IN THE MORNING AND AT BEDTIME 180 tablet 2   Multiple Vitamins-Minerals (MULTIVITAMIN WITH MINERALS) tablet Take 1 tablet by mouth daily.     nitrofurantoin (MACRODANTIN) 100 MG capsule Take 1 capsule by mouth at bedtime.     rosuvastatin (CRESTOR) 20 MG tablet TAKE 1 TABLET(20 MG) BY MOUTH DAILY 90 tablet 2   vitamin B-12 (CYANOCOBALAMIN) 100 MCG tablet Take 100 mcg by  mouth daily.     ALPRAZolam (XANAX) 0.25 MG tablet TAKE 1 TABLET BY MOUTH TWICE DAILY AS NEEDED FOR ANXIETY (Patient not taking: Reported on 05/05/2022) 30 tablet 0   ELMIRON 100 MG capsule Take 100 mg by mouth 3 (three) times daily. (Patient not taking: Reported on 05/05/2022)     Fluocinolone Acetonide Scalp 0.01 % OIL      loratadine (CLARITIN) 10 MG tablet Take 10 mg by mouth daily as needed.      Meth-Hyo-M Bl-Na Phos-Ph Sal (URIBEL) 118 MG CAPS Take 1 capsule by mouth 2 (two) times daily as needed (for pain).  (Patient not taking: Reported on 05/05/2022)     naproxen (NAPROSYN) 250 MG tablet Take 250 mg by mouth as needed. (Patient not taking: Reported on 05/05/2022)     triamcinolone cream (KENALOG) 0.1 % as needed.     Current Facility-Administered Medications  Medication Dose Route Frequency Provider Last Rate Last Admin   0.9 %  sodium chloride infusion  500 mL Intravenous Once Rachael Fee, MD        Allergies as of 05/05/2022   (No Known Allergies)    Family History  Problem Relation Age of Onset   Heart disease Mother 67       died CHF age 4   Cancer Father        Primary Cell Liver Cancer   Hyperlipidemia Sister    Hypertension Sister    Breast cancer Daughter 16   Mental illness Daughter    Drug abuse Grandchild    Diabetes Neg Hx    Colon cancer Neg Hx    Stomach cancer Neg Hx     Social History   Socioeconomic History   Marital status: Widowed    Spouse name: Not on file   Number of children: 3   Years of education: Not on file   Highest education level: Not on file  Occupational History    Employer: RETIRED  Tobacco Use   Smoking status: Never   Smokeless tobacco: Never  Vaping Use   Vaping Use: Never used  Substance and Sexual Activity   Alcohol use: Yes    Alcohol/week: 1.0 standard drink of alcohol    Types: 1 Glasses of wine per week    Comment: 1-2 glass per night, sometimes none   Drug use: No   Sexual activity: Never  Other Topics  Concern   Not on file  Social History Narrative   Husband died suicide age 73s.  Currently in long term romantic relationship.  Support from 2 daughters and good relationship with grandchildren.  Other daughter has mental health issues, strained relationship.  Pt feels she is not making good decisions re: children and has tried to get CPS  involved without relief.   1/3/13Lucila Maine (of daughter with mental health issues) facing jail time for selling drugs.  Pt has tried to support him and offered help with rehab or with changing colleges, but he refuses.     Social Determinants of Health   Financial Resource Strain: Not on file  Food Insecurity: Not on file  Transportation Needs: Not on file  Physical Activity: Not on file  Stress: Not on file  Social Connections: Not on file  Intimate Partner Violence: Not on file     Physical Exam: BP (!) 141/63   Pulse 73   Temp 98.7 F (37.1 C) (Skin)   Ht 5\' 3"  (1.6 m)   Wt 143 lb (64.9 kg)   SpO2 95%   BMI 25.33 kg/m  Constitutional: generally well-appearing Psychiatric: alert and oriented x3 Lungs: CTA bilaterally Heart: no MCR  Assessment and plan: 81 y.o. female with + cologaurd test  Colonoscopy today  Care is appropriate for the ambulatory setting.  97, MD [facility] 05/05/2022, 1:08 PM

## 2022-05-05 NOTE — Progress Notes (Signed)
Report to PACU, RN, vss, BBS= Clear.  

## 2022-05-08 ENCOUNTER — Telehealth: Payer: Self-pay | Admitting: *Deleted

## 2022-05-08 NOTE — Telephone Encounter (Signed)
No answer on first attempt follow up call. Left message.  ?

## 2022-06-20 ENCOUNTER — Other Ambulatory Visit: Payer: Self-pay | Admitting: Cardiovascular Disease

## 2022-06-20 NOTE — Telephone Encounter (Signed)
Rx(s) sent to pharmacy electronically.  

## 2022-08-19 ENCOUNTER — Other Ambulatory Visit: Payer: Self-pay | Admitting: Cardiovascular Disease

## 2022-08-21 NOTE — Telephone Encounter (Signed)
Rx(s) sent to pharmacy electronically.  

## 2022-11-29 ENCOUNTER — Other Ambulatory Visit: Payer: Self-pay | Admitting: Internal Medicine

## 2022-11-29 DIAGNOSIS — Z1231 Encounter for screening mammogram for malignant neoplasm of breast: Secondary | ICD-10-CM

## 2022-12-30 ENCOUNTER — Other Ambulatory Visit: Payer: Self-pay | Admitting: Cardiovascular Disease

## 2023-01-01 NOTE — Telephone Encounter (Signed)
Rx(s) sent to pharmacy electronically.  

## 2023-01-17 ENCOUNTER — Ambulatory Visit
Admission: RE | Admit: 2023-01-17 | Discharge: 2023-01-17 | Disposition: A | Discharge status: Home / Self Care | Payer: Medicare Other | Source: Ambulatory Visit | Attending: Internal Medicine | Admitting: Internal Medicine

## 2023-01-17 DIAGNOSIS — Z1231 Encounter for screening mammogram for malignant neoplasm of breast: Secondary | ICD-10-CM

## 2023-02-11 ENCOUNTER — Other Ambulatory Visit (HOSPITAL_BASED_OUTPATIENT_CLINIC_OR_DEPARTMENT_OTHER): Payer: Self-pay | Admitting: Nurse Practitioner

## 2023-02-12 NOTE — Telephone Encounter (Signed)
Rx refill sent to pharmacy. 

## 2023-02-21 ENCOUNTER — Other Ambulatory Visit: Payer: Self-pay | Admitting: Cardiovascular Disease

## 2023-02-21 NOTE — Telephone Encounter (Signed)
Rx(s) sent to pharmacy electronically.  

## 2023-02-27 ENCOUNTER — Ambulatory Visit (HOSPITAL_BASED_OUTPATIENT_CLINIC_OR_DEPARTMENT_OTHER): Payer: Medicare Other | Admitting: Family

## 2023-03-25 ENCOUNTER — Other Ambulatory Visit: Payer: Self-pay | Admitting: Cardiovascular Disease

## 2023-04-10 ENCOUNTER — Other Ambulatory Visit: Payer: Self-pay | Admitting: Cardiovascular Disease

## 2023-04-10 ENCOUNTER — Ambulatory Visit (HOSPITAL_BASED_OUTPATIENT_CLINIC_OR_DEPARTMENT_OTHER): Payer: Medicare Other | Admitting: Family

## 2023-04-10 ENCOUNTER — Encounter (HOSPITAL_BASED_OUTPATIENT_CLINIC_OR_DEPARTMENT_OTHER): Payer: Self-pay | Admitting: Family

## 2023-04-10 VITALS — BP 112/58 | HR 65 | Ht 63.0 in | Wt 137.0 lb

## 2023-04-10 DIAGNOSIS — I872 Venous insufficiency (chronic) (peripheral): Secondary | ICD-10-CM | POA: Diagnosis not present

## 2023-04-10 DIAGNOSIS — Z951 Presence of aortocoronary bypass graft: Secondary | ICD-10-CM

## 2023-04-10 DIAGNOSIS — I10 Essential (primary) hypertension: Secondary | ICD-10-CM

## 2023-04-10 DIAGNOSIS — E785 Hyperlipidemia, unspecified: Secondary | ICD-10-CM

## 2023-04-10 DIAGNOSIS — I25118 Atherosclerotic heart disease of native coronary artery with other forms of angina pectoris: Secondary | ICD-10-CM | POA: Diagnosis not present

## 2023-04-10 MED ORDER — LISINOPRIL 10 MG PO TABS: 10.0000 mg | ORAL_TABLET | Freq: Every day | ORAL | 1 refills | Status: DC

## 2023-04-10 NOTE — Telephone Encounter (Signed)
Pt has appointment today at 3:10. Can evaluate for refill at that time.

## 2023-04-10 NOTE — Progress Notes (Signed)
Office Visit    Patient Name: Melissa Gonzales Date of Encounter: 04/10/2023  PCP:  Rodrigo Ran, MD   [facility]  Cardiologist:  Chilton Si, MD  Advanced Practice Provider:  No care team member to display Electrophysiologist:  None      Chief Complaint    Melissa Gonzales is a 82 y.o. female presents today for hypertension follow up    Past Medical History    Past Medical History:  Diagnosis Date   Anxiety    Arthritis    CAD (coronary artery disease)    Depression    Diverticulosis    Gallstones    GERD (gastroesophageal reflux disease)    Glaucoma    Bilateral eyes   History of UTI    Hyperlipidemia    Hypertension    Interstitial cystitis    Peripheral neuropathy    Pneumonia    hx of   Urgency of urination    Past Surgical History:  Procedure Laterality Date   BACK SURGERY     CHOLECYSTECTOMY  1993   COLONOSCOPY     CORONARY ARTERY BYPASS GRAFT N/A 12/19/2018   Procedure: CORONARY ARTERY BYPASS GRAFTING (CABG)x3, using left internal mammary arteryand right and left greater saphenous veins harvested endoscopically;  Surgeon: Kerin Perna, MD;  Location: [facility] OR;  Service: Open Heart Surgery;  Laterality: N/A;   EYE SURGERY     Cataract Surgery Bilateral, 5 years ago   LEFT HEART CATH AND CORONARY ANGIOGRAPHY N/A 12/17/2018   Procedure: LEFT HEART CATH AND CORONARY ANGIOGRAPHY;  Surgeon: Lennette Bihari, MD;  Location: [facility] INVASIVE CV LAB;  Service: Cardiovascular;  Laterality: N/A;   MAXIMUM ACCESS (MAS)POSTERIOR LUMBAR INTERBODY FUSION (PLIF) 1 LEVEL N/A 03/17/2015   Procedure: Lumbar five -sacral one Maximum access posterior lumbar interbody fusion/Lumbar two-three  Laminectomy and extension of instrumentation to Sacral-one;  Surgeon: Tia Alert, MD;  Location: [facility] NEURO ORS;  Service: Neurosurgery;  Laterality: N/A;   MAXIMUM ACCESS (MAS)POSTERIOR LUMBAR INTERBODY FUSION (PLIF) 2 LEVEL N/A 11/27/2014   Procedure: LUMBAR  THREE-FOUR, LUMBAR FOUR-FIVE MAXIMUM ACCESS POSTERIOR LUMBAR INTERBODY FUSION;  Surgeon: Tia Alert, MD;  Location: [facility] NEURO ORS;  Service: Neurosurgery;  Laterality: N/A;  L3-4 L4-5 MAXIMUM ACCESS POSTERIOR LUMBAR INTERBODY FUSION   TEE WITHOUT CARDIOVERSION N/A 12/19/2018   Procedure: TRANSESOPHAGEAL ECHOCARDIOGRAM (TEE);  Surgeon: Donata Clay, Theron Arista, MD;  Location: [facility] OR;  Service: Open Heart Surgery;  Laterality: N/A;   VAGINAL HYSTERECTOMY  1970s   uterine prolapse    Allergies  No Known Allergies  History of Present Illness    Melissa Gonzales is a 82 y.o. female with a hx of CAD (NSTEMI 12/2018 ? CABG LIMA-LAD, SVG-PDA, SVG-OM1), HTN, HLD, depression, venous insufficiency last seen 02/06/22. 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  Atorvastatin previously stopped due to transaminitis but tolerates low dose Rosuvastatin. Carvedilol increased and Lisinopril previously added for BP control. Last seen 02/06/22 doing well from cardiac perspective.   Presents today for follow up independently. Enjoys reading. Also spending lots of time with family - most recently grandson's wedding in [place] and excited for upcoming birth of her great granddaughter. Reports short of breath only if more than usual activity. No chest pain, pressure, tightness. Does have problem with dizzines. Describes as feeling wobbly. Better if she has something gto hold onto. Eats 2 meals during the day and drinks coffee/water. BP at home 100-130s/60s. Most often <130/70.   EKGs/Labs/Other  Studies Reviewed:   The following studies were reviewed today: Cardiac Studies & Procedures   CARDIAC CATHETERIZATION  CARDIAC CATHETERIZATION 12/17/2018  Narrative  Ost RCA to Mid RCA lesion is 100% stenosed.  Prox Cx lesion is 40% stenosed.  Mid Cx to Dist Cx lesion is 85% stenosed.  RPDA lesion is 95% stenosed.  Ost RPDA to RPDA lesion is 90% stenosed.  Dist LM to Ost LAD lesion is 35% stenosed.  Prox LAD lesion is 70%  stenosed.  Prox LAD to Mid LAD lesion is 90% stenosed.  Severe extensive multivessel coronary calcification with significant three-vessel coronary obstructive disease.  The LAD has been 70% proximal stenoses and is a twin like vessel with 90% diffuse mid LAD stenosis.  The distal LAD does not reach the apex.  The circumflex vessel is very large caliber with extensive calcification with 40% proximal stenosis and 85% AV groove stenosis after the second marginal vessel and there is extensive collateralization to the right coronary artery via the circumflex vessel with 95 to 90% stenoses in the collateralized PDA vessel.  The RCA is extensively calcified and occluded at its ostium.  LVEDP 15 mm Hg.  RECOMMENDATION: Surgical consult for CABG revascularization.  Aggressive lipid-lowering therapy and optimal blood pressure to less than 130/80.  Findings Coronary Findings Diagnostic  Dominance: Right  Left Main Dist LM to Ost LAD lesion is 35% stenosed.  Left Anterior Descending Prox LAD lesion is 70% stenosed. Prox LAD to Mid LAD lesion is 90% stenosed.  Left Circumflex Prox Cx lesion is 40% stenosed. Mid Cx to Dist Cx lesion is 85% stenosed.  Right Coronary Artery Ost RCA to Mid RCA lesion is 100% stenosed.  Right Posterior Descending Artery Collaterals RPDA filled by collaterals from 2nd Mrg.  Ost RPDA to RPDA lesion is 90% stenosed. RPDA lesion is 95% stenosed.  Right Posterior Atrioventricular Artery Collaterals RPAV filled by collaterals from Dist Cx.  Intervention  No interventions have been documented.     ECHOCARDIOGRAM  ECHOCARDIOGRAM COMPLETE 12/15/2018  Narrative ECHOCARDIOGRAM REPORT    Patient Name:   Melissa Gonzales Date of Exam: 12/15/2018 Medical Rec #:  [id number]       Height:       64.0 in Accession #:    [id number]      Weight:       150.0 lb Date of Birth:  01/14/1941       BSA:          1.73 m Patient Age:    77 years        BP:           160/69  mmHg Patient Gender: F               HR:           85 bpm. Exam Location:  Inpatient   Procedure: 2D Echo  Indications:    Palpitations  History:        Patient has no prior history of Echocardiogram examinations. Risk Factors: Hypertension and Dyslipidemia.  Sonographer:    Lavenia Atlas Referring Phys: [phone] ANASTASSIA DOUTOVA  IMPRESSIONS   1. The left ventricle has hyperdynamic systolic function of >65%. The cavity size was normal. There is mildly increased left ventricular wall thickness. Echo evidence of impaired diastolic relaxation Elevated left ventricular end-diastolic pressure The E/e' is 84.6. 2. The right ventricle has normal systolic function. The cavity was normal. There is no increase in right ventricular wall thickness. 3. The mitral  valve is normal in structure. There is mild mitral annular calcification present. 4. The tricuspid valve is normal in structure.Tricuspid valve regurgitation is mild by color flow Doppler. 5. The aortic valve is tricuspid Aortic valve regurgitation is moderate by color flow Doppler. 6. The pulmonic valve was normal in structure.  FINDINGS Left Ventricle: The left ventricle has hyperdynamic systolic function of >65%. The cavity size was normal. There is mildly increased left ventricular wall thickness. Echo evidence of impaired diastolic relaxation Elevated left ventricular end-diastolic pressure The E/e' is 40.9. Right Ventricle: The right ventricle has normal systolic function. The cavity was normal. There is no increase in right ventricular wall thickness. Left Atrium: left atrial size was normal in size Right Atrium: right atrial size was normal in size Interatrial Septum: No atrial level shunt detected by color flow Doppler.  Pericardium: There is no evidence of pericardial effusion. Mitral Valve: The mitral valve is normal in structure. There is mild mitral annular calcification present. Mitral valve regurgitation is not  visualized by color flow Doppler. Tricuspid Valve: The tricuspid valve is normal in structure. Tricuspid valve regurgitation is mild by color flow Doppler. Aortic Valve: The aortic valve is tricuspid Aortic valve regurgitation is moderate by color flow Doppler. Pulmonic Valve: The pulmonic valve was normal in structure. Pulmonic valve regurgitation is not visualized by color flow Doppler. Venous: The inferior vena cava is normal in size with greater than 50% respiratory variability.  LEFT VENTRICLE PLAX 2D (Teich) LV EF:          68.1 %   Diastology LVIDd:          3.85 cm  LV e' lateral:   5.98 cm/s LVIDs:          2.41 cm  LV E/e' lateral: 15.4 LV PW:          1.17 cm  LV e' medial:    5.87 cm/s LV IVS:         1.10 cm  LV E/e' medial:  15.6 LVOT diam:      1.90 cm LV SV:          44 ml LVOT Area:      2.84 cm  RIGHT VENTRICLE RV Basal diam:  2.40 cm RV S prime:     18.90 cm/s TAPSE (M-mode): 2.0 cm RVSP:           28.1 mmHg  LEFT ATRIUM             Index       RIGHT ATRIUM           Index LA diam:        3.30 cm 1.91 cm/m  RA Pressure: 3 mmHg LA Vol (A2C):   36.6 ml 21.14 ml/m RA Area:     11.30 cm LA Vol (A4C):   18.1 ml 10.46 ml/m RA Volume:   24.40 ml  14.09 ml/m LA Biplane Vol: 25.8 ml 14.90 ml/m AORTIC VALVE LVOT Vmax:   168.00 cm/s LVOT Vmean:  101.000 cm/s LVOT VTI:    0.302 m AR PHT:      428 msec  AORTA Ao Root diam: 2.90 cm  MITRAL VALVE               TR Peak grad: 25.1 mmHg MV Area (PHT): 7.44 cm    TR Vmax:      277.00 cm/s MV PHT:        29.58 msec  RVSP:  28.1 mmHg MV Decel Time: 102 msec MV E velocity: 91.80 cm/s MV A velocity: 136.00 cm/s MV E/A ratio:  0.68   Armanda Magic MD Electronically signed by Armanda Magic MD Signature Date/Time: 12/15/2018/3:19:20 PM    Final   TEE  ECHO TEE 12/20/2018  Interpretation Summary  Aortic valve: No stenosis. Moderate regurgitation.  Mitral valve: Trace regurgitation.  Right ventricle:  Normal cavity size, wall thickness and ejection fraction.  Tricuspid valve: Mild regurgitation.    CT SCANS  CT CORONARY MORPH W/CTA COR W/SCORE 12/16/2018  Addendum 12/16/2018  3:31 PM ADDENDUM REPORT: 12/16/2018 15:29  CLINICAL DATA:  1F with hypertension, hyperlipidemia, GERD and chest pain.  EXAM: Cardiac/Coronary  CT  TECHNIQUE: The patient was scanned on a [employer].  FINDINGS: A 120 kV prospective scan was triggered in the descending thoracic aorta at 111 HU's. Axial non-contrast 3 mm slices were carried out through the heart. The data set was analyzed on a dedicated work station and scored using the Agatson method. Gantry rotation speed was 250 msecs and collimation was .6 mm. No beta blockade and 0.8 mg of sl NTG was given. The 3D data set was reconstructed in 5% intervals of the 67-82 % of the R-R cycle. Diastolic phases were analyzed on a dedicated work station using MPR, MIP and VRT modes. The patient received 80 cc of contrast.  Aorta:  Normal size.  calcifications.  No dissection.  Aortic Valve:  Trileaflet.  Mild calcification of the aortic root.  Coronary Arteries:  Normal coronary origin.  Right dominance.  RCA is a large dominant artery.  Diffuse calcification.  Left main is a large artery that gives rise to LAD and LCX arteries. There is minimal calcified plaque.  LAD is a large vessel.  There is diffuse calcification.  LCX is a non-dominant artery that is heavily calcified.  Other findings:  The degree of coronary stenosis is not interpretable due to mis-timing of the contrast bolus.  IMPRESSION: 1. Coronary calcium score of 3220. This was 99th percentile for age and sex matched control.  2. Normal coronary origin with right dominance.  3. The degree of coronary stenosis is not interpretable due to mis-timing of the contrast bolus.  4. Three vessel CAD noted.  5. Given very high coronary calcium score and ischemic  symptoms, recommend cardiac catheterization.  6. Recommend aggressive risk factor modification including high potency statin.  Chilton Si, MD   Electronically Signed By: Chilton Si On: 12/16/2018 15:29  Narrative EXAM: OVER-READ INTERPRETATION  CT CHEST  The following report is an over-read performed by radiologist Dr. Jeronimo Greaves of [facility] on 12/16/2018. This over-read does not include interpretation of cardiac or coronary anatomy or pathology. The coronary CTA interpretation by the cardiologist is attached.  COMPARISON:  CTA chest of 1 day prior.  FINDINGS: Vascular: Aortic atherosclerosis. Extremely early bolus timing, limiting evaluation of the aorta. Pulmonary arteries not opacified secondary to bolus timing.  Mediastinum/Nodes: No imaged thoracic adenopathy.  Lungs/Pleura: No pleural fluid.  Bibasilar scarring.  Upper Abdomen: Normal imaged portions of the liver, spleen, stomach.  Musculoskeletal: Moderate midthoracic spondylosis. Superior endplate compression deformity at T11 is mild.  IMPRESSION: 1.  No acute findings in the imaged extracardiac chest. 2.  Aortic Atherosclerosis (ICD10-I70.0).  Electronically Signed: By: Jeronimo Greaves M.D. On: 12/16/2018 13:42           EKG:  EKG is  ordered today.  The ekg ordered today demonstrates NSR 65 bpm  with poor R wave progression and no acute ST/T wave changes.   Recent Labs: No results found for requested labs within last 365 days.  Recent Lipid Panel    Component Value Date/Time   CHOL 123 05/14/2020 1154   TRIG 80 05/14/2020 1154   HDL 51 05/14/2020 1154   CHOLHDL 2.4 05/14/2020 1154   CHOLHDL 3.1 12/15/2018 0259   VLDL 23 12/15/2018 0259   LDLCALC 56 05/14/2020 1154   LDLDIRECT 118 (H) 06/02/2008 2046    Home Medications   Current Meds  Medication Sig   Abaloparatide (TYMLOS) 3120 MCG/1.56ML SOPN 80 mcg once a day in the a.m. as directed-patient needs 90 day supply    ALPHAGAN P 0.1 % SOLN Place 1 drop into both eyes every 12 (twelve) hours.    ALPRAZolam (XANAX) 0.25 MG tablet TAKE 1 TABLET BY MOUTH TWICE DAILY AS NEEDED FOR ANXIETY   ARIPiprazole (ABILIFY) 5 MG tablet Take 5 mg by mouth daily.   aspirin 81 MG chewable tablet Chew 81 mg by mouth daily.   Calcium Carb-Cholecalciferol (CALCIUM 600 + D PO) Take 1 tablet by mouth daily.    carvedilol (COREG) 25 MG tablet TAKE 1 TABLET(25 MG) BY MOUTH TWICE DAILY   dorzolamide-timolol (COSOPT) 22.3-6.8 MG/ML ophthalmic solution Place 1 drop into both eyes 2 (two) times daily.    escitalopram (LEXAPRO) 20 MG tablet Take 20 mg by mouth daily.    esomeprazole (NEXIUM) 40 MG capsule Take 1 capsule (40 mg total) by mouth daily before breakfast.   ezetimibe (ZETIA) 10 MG tablet TAKE 1 TABLET(10 MG) BY MOUTH DAILY   Fluocinolone Acetonide Scalp 0.01 % OIL    furosemide (LASIX) 20 MG tablet Take 1 tablet (20 mg total) by mouth daily.   latanoprost (XALATAN) 0.005 % ophthalmic solution Place 1 drop into both eyes daily.   levothyroxine (SYNTHROID, LEVOTHROID) 25 MCG tablet Take 1 tablet (25 mcg total) by mouth daily at 6 (six) AM.   lisinopril (ZESTRIL) 10 MG tablet Take 10 mg by mouth daily.   loratadine (CLARITIN) 10 MG tablet Take 10 mg by mouth daily as needed.    Multiple Vitamins-Minerals (MULTIVITAMIN WITH MINERALS) tablet Take 1 tablet by mouth daily.   naproxen (NAPROSYN) 250 MG tablet Take 250 mg by mouth as needed.   nitrofurantoin (MACRODANTIN) 100 MG capsule Take 1 capsule by mouth at bedtime.   rosuvastatin (CRESTOR) 20 MG tablet TAKE 1 TABLET(20 MG) BY MOUTH DAILY   triamcinolone cream (KENALOG) 0.1 % as needed.   vitamin B-12 (CYANOCOBALAMIN) 100 MCG tablet Take 100 mcg by mouth daily.     Review of Systems      All other systems reviewed and are otherwise negative except as noted above.  Physical Exam    VS:  BP (!) 112/58   Pulse 65   Ht 5\' 3"  (1.6 m)   Wt 137 lb (62.1 kg)   BMI 24.27  kg/m  , BMI Body mass index is 24.27 kg/m.  Wt Readings from Last 3 Encounters:  04/10/23 137 lb (62.1 kg)  05/05/22 143 lb (64.9 kg)  03/08/22 143 lb (64.9 kg)     GEN: Well nourished, well developed, in no acute distress. HEENT: normal. Neck: Supple, no JVD, carotid bruits, or masses. Cardiac: RRR, no murmurs, rubs, or gallops. No clubbing, cyanosis, edema.  Radials/PT 2+ and equal bilaterally.  Respiratory:  Respirations regular and unlabored, clear to auscultation bilaterally. GI: Soft, nontender, nondistended. MS: No deformity or atrophy. Skin: Warm  and dry, no rash. Neuro:  Strength and sensation are intact. Psych: Normal affect.  Assessment & Plan    CAD s/p CABG 2020 / HLD, LDL goal <70 - Stable with no anginal symptoms. No indication for ischemic evaluation.  GDMT includes aspirin, carvedilol, zetia, rosuvastatin (previous transaminitis on atorvastatin). .cwred   HTN - BP well controlled. Continue current antihypertensive regimen.  Refills provided.   Venous insufficiency - recommend leg elevation and compression stocings.          Disposition: Follow up in 1 year(s) with Chilton Si, MD or APP.  Signed, Alver Sorrow, NP 04/10/2023, 3:49 PM  [facility]

## 2023-04-10 NOTE — Patient Instructions (Signed)
Medication Instructions:  Your physician recommends that you continue on your current medications as directed. Please refer to the Current Medication list given to you today.  *If you need a refill on your cardiac medications before your next appointment, please call your pharmacy*  Follow-Up: At [facility], you and your health needs are our priority.  As part of our continuing mission to provide you with exceptional heart care, we have created designated Provider Care Teams.  These Care Teams include your primary Cardiologist (physician) and Advanced Practice Providers (APPs -  Physician Assistants and Nurse Practitioners) who all work together to provide you with the care you need, when you need it.  We recommend signing up for the patient portal called "MyChart".  Sign up information is provided on this After Visit Summary.  MyChart is used to connect with patients for Virtual Visits (Telemedicine).  Patients are able to view lab/test results, encounter notes, upcoming appointments, etc.  Non-urgent messages can be sent to your provider as well.   To learn more about what you can do with MyChart, go to [email or web address].    Your next appointment:   1 year(s)  Provider:   Chilton Si, MD    Other Instructions To prevent or reduce lower extremity swelling: Eat a low salt diet. Salt makes the body hold onto extra fluid which causes swelling. Sit with legs elevated. For example, in the recliner or on an ottoman.  Wear knee-high compression stockings during the daytime. Ones labeled 15-20 mmHg provide good compression.

## 2023-04-10 NOTE — Telephone Encounter (Signed)
She is taking 10mg  (I believe BID based on paper provided but she may have told you QD). Okay to refill as she is presently taking.   Alver Sorrow, NP

## 2023-04-15 ENCOUNTER — Encounter (HOSPITAL_BASED_OUTPATIENT_CLINIC_OR_DEPARTMENT_OTHER): Payer: Self-pay | Admitting: Family

## 2023-05-01 LAB — GLUCOSE, POCT (MANUAL RESULT ENTRY): Glucose Fasting, POC: 90 mg/dL (ref 70–99)

## 2023-05-12 ENCOUNTER — Other Ambulatory Visit (HOSPITAL_BASED_OUTPATIENT_CLINIC_OR_DEPARTMENT_OTHER): Payer: Self-pay | Admitting: Nurse Practitioner

## 2023-05-14 NOTE — Telephone Encounter (Signed)
This is Dr. Floyd's pt.  °

## 2023-05-14 NOTE — Telephone Encounter (Signed)
Rx request sent to pharmacy.  

## 2023-05-25 ENCOUNTER — Other Ambulatory Visit: Payer: Self-pay | Admitting: Cardiovascular Disease

## 2023-06-13 ENCOUNTER — Other Ambulatory Visit (HOSPITAL_COMMUNITY): Payer: Self-pay | Admitting: *Deleted

## 2023-06-18 ENCOUNTER — Ambulatory Visit (HOSPITAL_COMMUNITY)
Admission: RE | Admit: 2023-06-18 | Discharge: 2023-06-18 | Disposition: A | Discharge status: Home / Self Care | Payer: Medicare Other | Source: Ambulatory Visit | Attending: Internal Medicine | Admitting: Internal Medicine

## 2023-06-18 DIAGNOSIS — M810 Age-related osteoporosis without current pathological fracture: Secondary | ICD-10-CM | POA: Diagnosis present

## 2023-06-18 DIAGNOSIS — Z79620 Long term (current) use of immunosuppressive biologic: Secondary | ICD-10-CM | POA: Diagnosis not present

## 2023-06-18 MED ORDER — DENOSUMAB 60 MG/ML ~~LOC~~ SOSY: 60.0000 mg | PREFILLED_SYRINGE | Freq: Once | SUBCUTANEOUS | Status: DC

## 2023-06-18 MED ORDER — DENOSUMAB 60 MG/ML ~~LOC~~ SOSY
PREFILLED_SYRINGE | SUBCUTANEOUS | Status: AC
  Administered 2023-06-18: 60 mg via SUBCUTANEOUS
  Filled 2023-06-18: qty 1

## 2023-08-14 NOTE — Progress Notes (Deleted)
Cardiology Clinic Note   Patient Name: Melissa Gonzales Date of Encounter: 08/14/2023  Primary Care Provider:  Rodrigo Ran, MD Primary Cardiologist:  Chilton Si, MD  Patient Profile    ***  Past Medical History    Past Medical History:  Diagnosis Date   Anxiety    Arthritis    CAD (coronary artery disease)    Depression    Diverticulosis    Gallstones    GERD (gastroesophageal reflux disease)    Glaucoma    Bilateral eyes   History of UTI    Hyperlipidemia    Hypertension    Interstitial cystitis    Peripheral neuropathy    Pneumonia    hx of   Urgency of urination    Past Surgical History:  Procedure Laterality Date   BACK SURGERY     CHOLECYSTECTOMY  1993   COLONOSCOPY     CORONARY ARTERY BYPASS GRAFT N/A 12/19/2018   Procedure: CORONARY ARTERY BYPASS GRAFTING (CABG)x3, using left internal mammary arteryand right and left greater saphenous veins harvested endoscopically;  Surgeon: Kerin Perna, MD;  Location: [facility] OR;  Service: Open Heart Surgery;  Laterality: N/A;   EYE SURGERY     Cataract Surgery Bilateral, 5 years ago   LEFT HEART CATH AND CORONARY ANGIOGRAPHY N/A 12/17/2018   Procedure: LEFT HEART CATH AND CORONARY ANGIOGRAPHY;  Surgeon: Lennette Bihari, MD;  Location: [facility];  Service: Cardiovascular;  Laterality: N/A;   MAXIMUM ACCESS (MAS)POSTERIOR LUMBAR INTERBODY FUSION (PLIF) 1 LEVEL N/A 03/17/2015   Procedure: Lumbar five -sacral one Maximum access posterior lumbar interbody fusion/Lumbar two-three  Laminectomy and extension of instrumentation to Sacral-one;  Surgeon: Tia Alert, MD;  Location: [facility];  Service: Neurosurgery;  Laterality: N/A;   MAXIMUM ACCESS (MAS)POSTERIOR LUMBAR INTERBODY FUSION (PLIF) 2 LEVEL N/A 11/27/2014   Procedure: LUMBAR THREE-FOUR, LUMBAR FOUR-FIVE MAXIMUM ACCESS POSTERIOR LUMBAR INTERBODY FUSION;  Surgeon: Tia Alert, MD;  Location: [facility];  Service: Neurosurgery;  Laterality: N/A;   L3-4 L4-5 MAXIMUM ACCESS POSTERIOR LUMBAR INTERBODY FUSION   TEE WITHOUT CARDIOVERSION N/A 12/19/2018   Procedure: TRANSESOPHAGEAL ECHOCARDIOGRAM (TEE);  Surgeon: Donata Clay, Theron Arista, MD;  Location: [facility] OR;  Service: Open Heart Surgery;  Laterality: N/A;   VAGINAL HYSTERECTOMY  1970s   uterine prolapse    Allergies  No Known Allergies  History of Present Illness    ***  Home Medications    Prior to Admission medications   Medication Sig Start Date End Date Taking? Authorizing Provider  Abaloparatide (TYMLOS) 3120 MCG/1.56ML SOPN 80 mcg once a day in the a.m. as directed-patient needs 90 day supply 04/22/21   [provider]  ALPHAGAN P 0.1 % SOLN Place 1 drop into both eyes every 12 (twelve) hours.  04/05/12   [provider]  ALPRAZolam (XANAX) 0.25 MG tablet TAKE 1 TABLET BY MOUTH TWICE DAILY AS NEEDED FOR ANXIETY 05/05/15   Rumley, Steele N, DO  ARIPiprazole (ABILIFY) 5 MG tablet Take 5 mg by mouth daily.    [provider]  aspirin 81 MG chewable tablet Chew 81 mg by mouth daily.    [provider]  Calcium Carb-Cholecalciferol (CALCIUM 600 + D PO) Take 1 tablet by mouth daily.     [provider]  carvedilol (COREG) 25 MG tablet TAKE 1 TABLET(25 MG) BY MOUTH TWICE DAILY 05/25/23   Chilton Si, MD  dorzolamide-timolol (COSOPT) 22.3-6.8 MG/ML ophthalmic solution Place 1 drop into both eyes  2 (two) times daily.  03/20/11   [provider]  escitalopram (LEXAPRO) 20 MG tablet Take 20 mg by mouth daily.     [provider]  esomeprazole (NEXIUM) 40 MG capsule Take 1 capsule (40 mg total) by mouth daily before breakfast. 05/22/14   Rumley, Bowdon N, DO  ezetimibe (ZETIA) 10 MG tablet TAKE 1 TABLET(10 MG) BY MOUTH DAILY 05/25/23   Chilton Si, MD  Fluocinolone Acetonide Scalp 0.01 % OIL  01/30/19   [provider]  furosemide (LASIX) 20 MG tablet TAKE 1 TABLET(20 MG) BY MOUTH DAILY 05/14/23   Chilton Si, MD   latanoprost (XALATAN) 0.005 % ophthalmic solution Place 1 drop into both eyes daily. 01/09/22   [provider]  levothyroxine (SYNTHROID, LEVOTHROID) 25 MCG tablet Take 1 tablet (25 mcg total) by mouth daily at 6 (six) AM. 12/29/18   Sharlene Dory, PA-C  lisinopril (ZESTRIL) 10 MG tablet Take 1 tablet (10 mg total) by mouth daily. 04/10/23   Alver Sorrow, NP  loratadine (CLARITIN) 10 MG tablet Take 10 mg by mouth daily as needed.     [provider]  Multiple Vitamins-Minerals (MULTIVITAMIN WITH MINERALS) tablet Take 1 tablet by mouth daily.    [provider]  naproxen (NAPROSYN) 250 MG tablet Take 250 mg by mouth as needed.    [provider]  nitrofurantoin (MACRODANTIN) 100 MG capsule Take 1 capsule by mouth at bedtime. 01/20/19   [provider]  rosuvastatin (CRESTOR) 20 MG tablet TAKE 1 TABLET(20 MG) BY MOUTH DAILY 05/25/23   Chilton Si, MD  triamcinolone cream (KENALOG) 0.1 % as needed. 01/30/19   [provider]  vitamin B-12 (CYANOCOBALAMIN) 100 MCG tablet Take 100 mcg by mouth daily.    [provider]    Family History    Family History  Problem Relation Age of Onset   Heart disease Mother 75       died CHF age 58   Cancer Father        Primary Cell Liver Cancer   Hyperlipidemia Sister    Hypertension Sister    Breast cancer Daughter 63   Mental illness Daughter    Drug abuse Grandchild    Diabetes Neg Hx    Colon cancer Neg Hx    Stomach cancer Neg Hx    She indicated that the status of her mother is unknown. She indicated that the status of her father is unknown. She indicated that the status of her sister is unknown. She indicated that the status of her daughter is unknown. She indicated that the status of her grandchild is unknown. She indicated that the status of her neg hx is unknown.  Social History    Social History   Socioeconomic History   Marital status: Widowed    Spouse name: Not on  file   Number of children: 3   Years of education: Not on file   Highest education level: Not on file  Occupational History    Employer: RETIRED  Tobacco Use   Smoking status: Never   Smokeless tobacco: Never  Vaping Use   Vaping status: Never Used  Substance and Sexual Activity   Alcohol use: Yes    Alcohol/week: 1.0 standard drink of alcohol    Types: 1 Glasses of wine per week    Comment: 1-2 glass per night, sometimes none   Drug use: No   Sexual activity: Never  Other Topics Concern   Not on file  Social History Narrative   Husband died suicide age 37s.  Currently in long term romantic relationship.  Support from 2 daughters and good relationship with grandchildren.  Other daughter has mental health issues, strained relationship.  Pt feels she is not making good decisions re: children and has tried to get CPS involved without relief.   1/3/13Lucila Maine (of daughter with mental health issues) facing jail time for selling drugs.  Pt has tried to support him and offered help with rehab or with changing colleges, but he refuses.     Social Determinants of Health   Financial Resource Strain: Not on file  Food Insecurity: No Food Insecurity (05/01/2023)   Hunger Vital Sign    Worried About Running Out of Food in the Last Year: Never true    Ran Out of Food in the Last Year: Never true  Transportation Needs: No Transportation Needs (05/07/2023)   PRAPARE - Administrator, Civil Service (Medical): No    Lack of Transportation (Non-Medical): No  Physical Activity: Not on file  Stress: Not on file  Social Connections: Not on file  Intimate Partner Violence: Not At Risk (05/01/2023)   Humiliation, Afraid, Rape, and Kick questionnaire    Fear of Current or Ex-Partner: No    Emotionally Abused: No    Physically Abused: No    Sexually Abused: No     Review of Systems    General:  No chills, fever, night sweats or weight changes.  Cardiovascular:  No chest pain, dyspnea  on exertion, edema, orthopnea, palpitations, paroxysmal nocturnal dyspnea. Dermatological: No rash, lesions/masses Respiratory: No cough, dyspnea Urologic: No hematuria, dysuria Abdominal:   No nausea, vomiting, diarrhea, bright red blood per rectum, melena, or hematemesis Neurologic:  No visual changes, wkns, changes in mental status. All other systems reviewed and are otherwise negative except as noted above.  Physical Exam    VS:  There were no vitals taken for this visit. , BMI There is no height or weight on file to calculate BMI. GEN: Well nourished, well developed, in no acute distress. HEENT: normal. Neck: Supple, no JVD, carotid bruits, or masses. Cardiac: RRR, no murmurs, rubs, or gallops. No clubbing, cyanosis, edema.  Radials/DP/PT 2+ and equal bilaterally.  Respiratory:  Respirations regular and unlabored, clear to auscultation bilaterally. GI: Soft, nontender, nondistended, BS + x 4. MS: no deformity or atrophy. Skin: warm and dry, no rash. Neuro:  Strength and sensation are intact. Psych: Normal affect.  Accessory Clinical Findings    Recent Labs: No results found for requested labs within last 365 days.   Recent Lipid Panel    Component Value Date/Time   CHOL 123 05/14/2020 1154   TRIG 80 05/14/2020 1154   HDL 51 05/14/2020 1154   CHOLHDL 2.4 05/14/2020 1154   CHOLHDL 3.1 12/15/2018 0259   VLDL 23 12/15/2018 0259   LDLCALC 56 05/14/2020 1154   LDLDIRECT 118 (H) 06/02/2008 2046    No BP recorded.  {Refresh Note OR Click here to enter BP  :1}***    ECG personally reviewed by me today- ***          Assessment & Plan   1.  ***   Thomasene Ripple. Mykaylah Ballman NP-C     08/14/2023, 1:00 PM [facility] [address] Office [phone] Fax [phone]    I spent***minutes examining this patient, reviewing medications, and using patient centered shared decision making involving her cardiac care.  Prior to her  visit I  spent greater than 20 minutes reviewing her past medical history,  medications, and prior cardiac tests.

## 2023-08-16 ENCOUNTER — Ambulatory Visit: Payer: Medicare Other | Admitting: General Practice

## 2023-11-15 ENCOUNTER — Other Ambulatory Visit (HOSPITAL_BASED_OUTPATIENT_CLINIC_OR_DEPARTMENT_OTHER): Payer: Self-pay | Admitting: Cardiovascular Disease

## 2024-01-14 ENCOUNTER — Other Ambulatory Visit: Payer: Self-pay | Admitting: Internal Medicine

## 2024-01-14 DIAGNOSIS — Z1231 Encounter for screening mammogram for malignant neoplasm of breast: Secondary | ICD-10-CM

## 2024-02-14 ENCOUNTER — Ambulatory Visit
Admission: RE | Admit: 2024-02-14 | Discharge: 2024-02-14 | Disposition: A | Discharge status: Home / Self Care | Source: Ambulatory Visit | Attending: Internal Medicine | Admitting: Internal Medicine

## 2024-02-14 DIAGNOSIS — Z1231 Encounter for screening mammogram for malignant neoplasm of breast: Secondary | ICD-10-CM

## 2024-02-16 ENCOUNTER — Other Ambulatory Visit: Payer: Self-pay | Admitting: Cardiovascular Disease

## 2024-02-19 ENCOUNTER — Other Ambulatory Visit: Payer: Self-pay | Admitting: Cardiovascular Disease

## 2024-03-10 ENCOUNTER — Other Ambulatory Visit (HOSPITAL_COMMUNITY): Payer: Self-pay | Admitting: *Deleted

## 2024-03-11 ENCOUNTER — Encounter (HOSPITAL_COMMUNITY)
Admission: RE | Admit: 2024-03-11 | Discharge: 2024-03-11 | Disposition: A | Discharge status: Home / Self Care | Source: Ambulatory Visit | Attending: Internal Medicine | Admitting: Internal Medicine

## 2024-03-11 DIAGNOSIS — M810 Age-related osteoporosis without current pathological fracture: Secondary | ICD-10-CM | POA: Insufficient documentation

## 2024-03-11 MED ORDER — DENOSUMAB 60 MG/ML ~~LOC~~ SOSY
PREFILLED_SYRINGE | SUBCUTANEOUS | Status: AC
  Filled 2024-03-11: qty 1

## 2024-03-11 MED ORDER — DENOSUMAB 60 MG/ML ~~LOC~~ SOSY: 60.0000 mg | PREFILLED_SYRINGE | Freq: Once | SUBCUTANEOUS | Status: DC

## 2024-03-11 MED ORDER — DENOSUMAB 60 MG/ML ~~LOC~~ SOSY
60.0000 mg | PREFILLED_SYRINGE | Freq: Once | SUBCUTANEOUS | Status: AC
  Administered 2024-03-11: 60 mg via SUBCUTANEOUS

## 2024-04-09 ENCOUNTER — Ambulatory Visit (HOSPITAL_BASED_OUTPATIENT_CLINIC_OR_DEPARTMENT_OTHER): Payer: Medicare Other | Admitting: Cardiovascular Disease

## 2024-04-09 ENCOUNTER — Encounter (HOSPITAL_BASED_OUTPATIENT_CLINIC_OR_DEPARTMENT_OTHER): Payer: Self-pay | Admitting: Cardiovascular Disease

## 2024-04-09 VITALS — BP 130/58 | HR 59 | Ht 63.0 in | Wt 145.0 lb

## 2024-04-09 DIAGNOSIS — Z951 Presence of aortocoronary bypass graft: Secondary | ICD-10-CM

## 2024-04-09 DIAGNOSIS — R011 Cardiac murmur, unspecified: Secondary | ICD-10-CM | POA: Diagnosis not present

## 2024-04-09 DIAGNOSIS — I25118 Atherosclerotic heart disease of native coronary artery with other forms of angina pectoris: Secondary | ICD-10-CM

## 2024-04-09 DIAGNOSIS — E785 Hyperlipidemia, unspecified: Secondary | ICD-10-CM

## 2024-04-09 DIAGNOSIS — I10 Essential (primary) hypertension: Secondary | ICD-10-CM

## 2024-04-09 HISTORY — DX: Cardiac murmur, unspecified: R01.1

## 2024-04-09 NOTE — Patient Instructions (Signed)
 Medication Instructions:  Your physician recommends that you continue on your current medications as directed. Please refer to the Current Medication list given to you today.   *If you need a refill on your cardiac medications before your next appointment, please call your pharmacy*  Lab Work: NONE  Testing/Procedures: Your physician has requested that you have an echocardiogram. Echocardiography is a painless test that uses sound waves to create images of your heart. It provides your doctor with information about the size and shape of your heart and how well your heart's chambers and valves are working. This procedure takes approximately one hour. There are no restrictions for this procedure. Please do NOT wear cologne, perfume, aftershave, or lotions (deodorant is allowed). Please arrive 15 minutes prior to your appointment time.  Please note: We ask at that you not bring children with you during ultrasound (echo/ vascular) testing. Due to room size and safety concerns, children are not allowed in the ultrasound rooms during exams. Our front office staff cannot provide observation of children in our lobby area while testing is being conducted. An adult accompanying a patient to their appointment will only be allowed in the ultrasound room at the discretion of the ultrasound technician under special circumstances. We apologize for any inconvenience.  Follow-Up: At [facility], you and your health needs are our priority.  As part of our continuing mission to provide you with exceptional heart care, our providers are all part of one team.  This team includes your primary Cardiologist (physician) and Advanced Practice Providers or APPs (Physician Assistants and Nurse Practitioners) who all work together to provide you with the care you need, when you need it.  Your next appointment:   12 month(s)  Provider:   Maudine Sos, MD, Slater Duncan, NP, or Neomi Banks, NP     We  recommend signing up for the patient portal called "MyChart".  Sign up information is provided on this After Visit Summary.  MyChart is used to connect with patients for Virtual Visits (Telemedicine).  Patients are able to view lab/test results, encounter notes, upcoming appointments, etc.  Non-urgent messages can be sent to your provider as well.   To learn more about what you can do with MyChart, go to [email or web address].

## 2024-04-09 NOTE — Progress Notes (Signed)
 Cardiology Office Note:  .   Date:  04/09/2024  ID:  Melissa Gonzales, DOB November 16, 1940, MRN [id number] PCP: Melissa Hun, MD  [facility] Providers Cardiologist:  Melissa Sos, MD    History of Present Illness: .   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  She was initially admitted 12/2018 with NSTEMI.  She underwent LHC and was found to have 3 vessel CAD.  She underwent three-vessel CABG with Dr. Matt Gonzales on 12/19/18.  She required diuresis after discharge.  She was started on HCTZ 01/2019 due to poorly controlled hypertension. She saw Melissa Gonzales virtually on 03/2019 and complained of fatigue and depression.  She has been treated with Lexapro and recently added Wellbutrin, which has helped.   Carvedilol  was increased and she was started on lisinopril  due to poorly controlled hypertension.  She continues to struggle with L>R LE edema since surgery.  She has no orthopnea or PND.  She also has neuropathy which makes it hard for her to walk and exercise.  Atorvastatin  was discontinued due to transaminitis.  Her liver function improved and she was started on low-dose rosuvastatin .  She had lower extremity edema that was thought to be more due to venous insufficiency than heart failure.  BNP was 43.  Hydrochlorothiazide  was switched to furosemide .  She was referred to [facility]. At her visit 02/2021 she was doing well.  SHe last saw Melissa Banks, NP 04/2023 and was doing well.   Discussed the use of AI scribe software for clinical note transcription with the patient, who gave verbal consent to proceed.  History of Present Illness Ms. Melissa Gonzales experiences elevated blood pressure in the morning, typically around 160/70 mmHg, which decreases to 110 mmHg a few hours later. Her current medication regimen includes lisinopril  20 mg twice daily and carvedilol  twice daily. There is concern about  potential hypotension if the medication dosage is increased, so the current regimen is maintained.  She has peripheral edema, with swelling in her legs that improves with leg elevation at night. She does not use compression hose regularly due to difficulty in application and lack of assistance. No associated dyspnea when lying down.  She engages in regular physical activity, including two chair yoga classes, a chair aerobics class, and a strength training class weekly, and reports feeling great during these activities.  She recently moved to a friend's home and is enjoying the new environment, which offers many activities and social opportunities. Her nutrition is good, and she has no complaints about her current living situation.  No chest pain or shortness of breath when lying down. Breathing has been okay.  ROS:  As per HPI  Studies Reviewed: Melissa Gonzales   EKG Interpretation Date/Time:  Wednesday April 09 2024 09:25:41 EDT Ventricular Rate:  59 PR Interval:  212 QRS Duration:  90 QT Interval:  456 QTC Calculation: 451 R Axis:   63  Text Interpretation: Sinus bradycardia with 1st degree A-V block When compared with ECG of 20-Dec-2018 07:09, PR interval has increased Nonspecific T wave abnormality no longer evident in Lateral leads Confirmed by Melissa Gonzales ([id number]) on 04/09/2024 9:43:29 AM   Echo 12/2018: 1. The left ventricle has hyperdynamic systolic function of >65%. The  cavity size was normal. There is mildly increased left ventricular wall  thickness. Echo evidence of impaired diastolic relaxation Elevated left  ventricular end-diastolic pressure The  E/e' is 15.6.  2. The right ventricle has normal systolic function. The cavity was  normal. There is no increase in right ventricular wall thickness.   3. The mitral valve is normal in structure. There is mild mitral annular  calcification present.   4. The tricuspid valve is normal in structure.Tricuspid valve  regurgitation is mild  by color flow Doppler.   5. The aortic valve is tricuspid Aortic valve regurgitation is moderate  by color flow Doppler.   Risk Assessment/Calculations:             Physical Exam:   VS:  BP (!) 130/58   Pulse (!) 59   Ht 5\' 3"  (1.6 m)   Wt 145 lb (65.8 kg)   SpO2 95%   BMI 25.69 kg/m  , BMI Body mass index is 25.69 kg/m. GENERAL:  Well appearing HEENT: Pupils equal round and reactive, fundi not visualized, oral mucosa unremarkable NECK:  No jugular venous distention, waveform within normal limits, carotid upstroke brisk and symmetric, no bruits, no thyromegaly LUNGS:  Clear to auscultation bilaterally HEART:  RRR.  PMI not displaced or sustained,S1 and S2 within normal limits, no S3, no S4, no clicks, no rubs, II/VI mid-peaking systolic murmur at the LUSB ABD:  Flat, positive bowel sounds normal in frequency in pitch, no bruits, no rebound, no guarding, no midline pulsatile mass, no hepatomegaly, no splenomegaly EXT:  2 plus pulses throughout, 1+ bilateral LE edema, no cyanosis no clubbing SKIN:  No rashes no nodules NEURO:  Cranial nerves II through XII grossly intact, motor grossly intact throughout PSYCH:  Cognitively intact, oriented to person place and time   ASSESSMENT AND PLAN: .    Assessment & Plan # Hypertension Hypertension with elevated morning readings, normalizing within hours. Current management includes lisinopril  and carvedilol . Concern about hypotension with increased dosage. Emphasized 12-hour interval for medication. - Continue lisinopril  20 mg twice daily and carvedilol  twice daily. - Ensure medications are taken approximately 12 hours apart.  # Heart murmur Heart murmur present. EKG normal. Echocardiogram needed to assess aortic valve and cardiac structures. - Order echocardiogram to evaluate heart murmur and assess for changes in the aortic valve.  # Peripheral edema Chronic peripheral edema likely due to venous insufficiency. Edema improves with leg  elevation. No significant fluid overload. - Consider strategies to assist with compression hose application if feasible. - Continue leg elevation at night.  # CAD s/p CABG:  She is exercising regularly and has no symptoms.  Continue aspirin , carvedilol , Zetia  and rosuvastatin .          Dispo: f/u in 1 year  Signed, Melissa Sos, MD

## 2024-05-08 ENCOUNTER — Ambulatory Visit (HOSPITAL_BASED_OUTPATIENT_CLINIC_OR_DEPARTMENT_OTHER)

## 2024-05-08 DIAGNOSIS — R011 Cardiac murmur, unspecified: Secondary | ICD-10-CM | POA: Diagnosis not present

## 2024-05-08 LAB — ECHOCARDIOGRAM COMPLETE
Area-P 1/2: 3.27 cm2
MV M vel: 4.91 m/s
MV Peak grad: 96.4 mmHg
P 1/2 time: 467 ms
Radius: 0.7 cm
S' Lateral: 2.41 cm

## 2024-05-20 ENCOUNTER — Ambulatory Visit: Payer: Self-pay | Admitting: Cardiovascular Disease

## 2024-07-02 ENCOUNTER — Other Ambulatory Visit: Payer: Self-pay | Admitting: Cardiovascular Disease

## 2024-08-18 ENCOUNTER — Ambulatory Visit (HOSPITAL_BASED_OUTPATIENT_CLINIC_OR_DEPARTMENT_OTHER): Admitting: Family

## 2024-08-20 ENCOUNTER — Other Ambulatory Visit: Payer: Self-pay | Admitting: Cardiovascular Disease

## 2024-09-09 ENCOUNTER — Other Ambulatory Visit (HOSPITAL_COMMUNITY): Payer: Self-pay | Admitting: Internal Medicine

## 2024-09-09 ENCOUNTER — Telehealth (HOSPITAL_COMMUNITY): Payer: Self-pay | Admitting: Pharmacy Technician

## 2024-09-09 DIAGNOSIS — M810 Age-related osteoporosis without current pathological fracture: Secondary | ICD-10-CM | POA: Insufficient documentation

## 2024-09-09 NOTE — Telephone Encounter (Signed)
 Auth Submission: APPROVED Site of care: [facility] Payer: [employer] MEDICARE Medication & CPT/J Code(s) submitted: [id number] [place] Diagnosis Code: M81.0 Route of submission (phone, fax, portal): portal Phone # Fax # Auth type: Buy/Bill HB Units/visits requested: 60mg  x 2 doses, q 6 months Reference number: [id number] Approval from: 09/09/2024 to 09/09/25     Dagoberto Armour, CPhT [facility] Phone: [phone] 09/09/2024

## 2024-09-23 ENCOUNTER — Ambulatory Visit (HOSPITAL_COMMUNITY)
Admission: RE | Admit: 2024-09-23 | Discharge: 2024-09-23 | Disposition: A | Discharge status: Home / Self Care | Source: Ambulatory Visit | Attending: Internal Medicine | Admitting: Internal Medicine

## 2024-09-23 VITALS — BP 144/69 | HR 68 | Temp 97.9°F | Resp 17

## 2024-09-23 DIAGNOSIS — M810 Age-related osteoporosis without current pathological fracture: Secondary | ICD-10-CM | POA: Insufficient documentation

## 2024-09-23 DIAGNOSIS — Z79620 Long term (current) use of immunosuppressive biologic: Secondary | ICD-10-CM | POA: Insufficient documentation

## 2024-09-23 MED ORDER — DENOSUMAB-BBDZ 60 MG/ML ~~LOC~~ SOSY
60.0000 mg | PREFILLED_SYRINGE | Freq: Once | SUBCUTANEOUS | Status: AC
  Administered 2024-09-23: 60 mg via SUBCUTANEOUS

## 2024-09-23 MED ORDER — DENOSUMAB-BBDZ 60 MG/ML ~~LOC~~ SOSY
PREFILLED_SYRINGE | SUBCUTANEOUS | Status: AC
  Filled 2024-09-23: qty 1

## 2024-10-28 ENCOUNTER — Ambulatory Visit (HOSPITAL_BASED_OUTPATIENT_CLINIC_OR_DEPARTMENT_OTHER): Admitting: Family

## 2024-10-28 ENCOUNTER — Encounter (HOSPITAL_BASED_OUTPATIENT_CLINIC_OR_DEPARTMENT_OTHER): Payer: Self-pay | Admitting: Family

## 2024-10-28 VITALS — BP 136/48 | HR 56 | Ht 63.0 in | Wt 148.7 lb

## 2024-10-28 DIAGNOSIS — I361 Nonrheumatic tricuspid (valve) insufficiency: Secondary | ICD-10-CM

## 2024-10-28 DIAGNOSIS — I10 Essential (primary) hypertension: Secondary | ICD-10-CM | POA: Diagnosis not present

## 2024-10-28 DIAGNOSIS — E785 Hyperlipidemia, unspecified: Secondary | ICD-10-CM

## 2024-10-28 DIAGNOSIS — I25118 Atherosclerotic heart disease of native coronary artery with other forms of angina pectoris: Secondary | ICD-10-CM

## 2024-10-28 DIAGNOSIS — I872 Venous insufficiency (chronic) (peripheral): Secondary | ICD-10-CM | POA: Diagnosis not present

## 2024-10-28 DIAGNOSIS — Z79899 Other long term (current) drug therapy: Secondary | ICD-10-CM | POA: Diagnosis not present

## 2024-10-28 DIAGNOSIS — I071 Rheumatic tricuspid insufficiency: Secondary | ICD-10-CM

## 2024-10-28 MED ORDER — FUROSEMIDE 40 MG PO TABS: 40.0000 mg | ORAL_TABLET | Freq: Every day | ORAL | 1 refills | Status: AC

## 2024-10-28 NOTE — Progress Notes (Signed)
 " Cardiology Office Note   Date:  10/28/2024  ID:  Tymber, Stallings 1941/01/30, MRN [id number] PCP: Shayne Anes, MD  [facility] Providers Cardiologist:  Annabella Scarce, MD     History of Present Illness Melissa Gonzales is a 83 y.o. female with a hx of CAD (NSTEMI 12/2018 ? CABG LIMA-LAD, SVG-PDA, SVG-OM1), HTN, HLD, depression, venous insufficiency, valvular heart disease. 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   Atorvastatin  previously stopped due to transaminitis but tolerates low dose Rosuvastatin . Carvedilol  increased and Lisinopril  previously added for BP control.   She was seen 04/09/24 by Dr. Scarce.  Her BP was elevated in the morning and normalized within a couple of hours.  She was advised to continue lisinopril  and carvedilol  twice daily.  She had LE edema secondary to venous insufficiency and leg elevation recommended.  She was without anginal symptoms.  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   Presents today for follow up. Resides at [facility]. She notes bilateral LE edema that is present throughout the day and ongoing. Reports no chest pain, pressure, tightness, orthopnea, PND. Notes dyspnea at rest or with activity that has progressed from prior.   ROS: Please see the history of present illness.    All other systems reviewed and are negative.   Studies Reviewed      Cardiac Studies & Procedures   ______________________________________________________________________________________________ CARDIAC CATHETERIZATION  CARDIAC CATHETERIZATION 12/17/2018  Conclusion  Ost RCA to Mid RCA lesion is 100% stenosed.  Prox Cx lesion is 40% stenosed.  Mid Cx to Dist Cx lesion is 85% stenosed.  RPDA lesion is 95% stenosed.  Ost RPDA to RPDA lesion is 90%  stenosed.  Dist LM to Ost LAD lesion is 35% stenosed.  Prox LAD lesion is 70% stenosed.  Prox LAD to Mid LAD lesion is 90% stenosed.  Severe extensive multivessel coronary calcification with significant three-vessel coronary obstructive disease.  The LAD has been 70% proximal stenoses and is a twin like vessel with 90% diffuse mid LAD stenosis.  The distal LAD does not reach the apex.  The circumflex vessel is very large caliber with extensive calcification with 40% proximal stenosis and 85% AV groove stenosis after the second marginal vessel and there is extensive collateralization to the right coronary artery via the circumflex vessel with 95 to 90% stenoses in the collateralized PDA vessel.  The RCA is extensively calcified and occluded at its ostium.  LVEDP 15 mm Hg.  RECOMMENDATION: Surgical consult for CABG revascularization.  Aggressive lipid-lowering therapy and optimal blood pressure to less than 130/80.  Findings Coronary Findings Diagnostic  Dominance: Right  Left Main Dist LM to Ost LAD lesion is 35% stenosed.  Left Anterior Descending Prox LAD lesion is 70% stenosed. Prox LAD to Mid LAD lesion is 90% stenosed.  Left Circumflex Prox Cx lesion is 40% stenosed. Mid Cx to Dist Cx lesion is 85% stenosed.  Right Coronary Artery Ost RCA to Mid RCA lesion is 100% stenosed.  Right Posterior Descending Artery Collaterals RPDA filled by collaterals from 2nd Mrg.  Ost RPDA to RPDA lesion is 90% stenosed. RPDA lesion is 95% stenosed.  Right Posterior Atrioventricular Artery Collaterals RPAV filled by collaterals from Dist Cx.  Intervention  No interventions have been documented.     ECHOCARDIOGRAM  ECHOCARDIOGRAM COMPLETE  05/08/2024  Narrative ECHOCARDIOGRAM REPORT    Patient Name:   Melissa Gonzales [facility] Date of Exam: 05/08/2024 Medical Rec #:  [id number]         Height:       63.0 in Accession #:    [id number]        Weight:       145.0 lb Date of Birth:   [birth year]         BSA:          1.687 m Patient Age:    83 years          BP:           110/60 mmHg Patient Gender: F                 HR:           67 bpm. Exam Location:  Outpatient  Procedure: 3D Echo, 2D Echo, Cardiac Doppler, Color Doppler and Strain Analysis (Both Spectral and Color Flow Doppler were utilized during procedure).  Indications:    R01.1 Murmur  History:        Patient has prior history of Echocardiogram examinations, most recent 12/20/2018. Prior CABG, Signs/Symptoms:Murmur; Risk Factors:Non-Smoker and Hypertension.  Sonographer:    Orvil Holmes RDCS Referring Phys: [id number] TIFFANY [place]  IMPRESSIONS   1. Left ventricular ejection fraction, by estimation, is 65 to 70%. Left ventricular ejection fraction by 3D volume is 65 %. The left ventricle has normal function. The left ventricle has no regional wall motion abnormalities. 2. Right ventricular systolic function is normal. The right ventricular size is mildly enlarged. 3. Difficult acoustic window MV leaflets are thickened with annular calcification. MR is more posteriorly directed. Parasternal long axis shows prolapse of a MV leaflet (Frame 3) Consider TEE to further define anatomy/function. . The mitral valve is degenerative. Mild to moderate mitral valve regurgitation. 4. Tricuspid valve regurgitation is severe. 5. The aortic valve is tricuspid. Aortic valve regurgitation is moderate. Aortic valve sclerosis/calcification is present, without any evidence of aortic stenosis. 6. The inferior vena cava is normal in size with greater than 50% respiratory variability, suggesting right atrial pressure of 3 mmHg.  FINDINGS Left Ventricle: Left ventricular ejection fraction, by estimation, is 65 to 70%. Left ventricular ejection fraction by 3D volume is 65 %. The left ventricle has normal function. The left ventricle has no regional wall motion abnormalities. The global longitudinal strain is normal despite  suboptimal segment tracking. The left ventricular internal cavity size was normal in size. There is no left ventricular hypertrophy.  Right Ventricle: The right ventricular size is mildly enlarged. Right vetricular wall thickness was not assessed. Right ventricular systolic function is normal.  Left Atrium: Left atrial size was normal in size.  Right Atrium: Right atrial size was normal in size.  Pericardium: There is no evidence of pericardial effusion.  Mitral Valve: Difficult acoustic window MV leaflets are thickened with annular calcification. MR is more posteriorly directed. Parasternal long axis shows prolapse of a MV leaflet (Frame 3) Consider TEE to further define anatomy/function. The mitral valve is degenerative in appearance. There is moderate thickening of the mitral valve leaflet(s). Mild mitral annular calcification. Mild to moderate mitral valve regurgitation.  Tricuspid Valve: The tricuspid valve is normal in structure. Tricuspid valve regurgitation is severe.  Aortic Valve: The aortic valve is tricuspid. Aortic valve regurgitation is moderate. Aortic regurgitation PHT measures 467 msec. Aortic valve sclerosis/calcification is present, without any evidence of aortic stenosis.  Pulmonic Valve: The  pulmonic valve was normal in structure. Pulmonic valve regurgitation is trivial.  Aorta: The aortic root and ascending aorta are structurally normal, with no evidence of dilitation.  Venous: The inferior vena cava is normal in size with greater than 50% respiratory variability, suggesting right atrial pressure of 3 mmHg.  IAS/Shunts: No atrial level shunt detected by color flow Doppler.  Additional Comments: 3D was performed not requiring image post processing on an independent workstation and was normal.   LEFT VENTRICLE PLAX 2D LVIDd:         4.08 cm         Diastology LVIDs:         2.41 cm         LV e' medial:    5.87 cm/s LV PW:         0.88 cm         LV E/e' medial:   14.0 LV IVS:        0.84 cm         LV e' lateral:   9.57 cm/s LVOT diam:     2.00 cm         LV E/e' lateral: 8.6 LV SV:         72 LV SV Index:   43              2D Longitudinal LVOT Area:     3.14 cm        Strain 2D Strain GLS   -18.4 % (A4C): 2D Strain GLS   -15.5 % (A3C): 2D Strain GLS   -18.0 % (A2C):  3D Volume EF LV 3D EF:    Left ventricul ar ejection fraction by 3D volume is 65 %.  3D Volume EF: 3D EF:        65 % LV EDV:       94 ml LV ESV:       33 ml LV SV:        60 ml  RIGHT VENTRICLE RV Basal diam:  4.50 cm RV Mid diam:    3.77 cm RV S prime:     5.55 cm/s TAPSE (M-mode): 1.9 cm  LEFT ATRIUM             Index        RIGHT ATRIUM           Index LA diam:        5.00 cm 2.96 cm/m   RA Area:     14.20 cm LA Vol (A2C):   56.0 ml 33.20 ml/m  RA Volume:   39.90 ml  23.66 ml/m LA Vol (A4C):   42.3 ml 25.08 ml/m LA Biplane Vol: 49.9 ml 29.59 ml/m AORTIC VALVE LVOT Vmax:   102.00 cm/s LVOT Vmean:  68.100 cm/s LVOT VTI:    0.229 m AI PHT:      467 msec  AORTA Ao Root diam: 3.20 cm Ao Asc diam:  3.40 cm  MITRAL VALVE                  TRICUSPID VALVE MV Area (PHT): 3.27 cm       TR Peak grad:   30.7 mmHg MV Decel Time: 232 msec       TR Vmax:        277.00 cm/s MR Peak grad:    96.4 mmHg MR Mean grad:    58.0 mmHg    SHUNTS MR Vmax:  491.00 cm/s  Systemic VTI:  0.23 m MR Vmean:        356.0 cm/s   Systemic Diam: 2.00 cm MR PISA:         3.08 cm MR PISA Eff ROA: 19 mm MR PISA Radius:  0.70 cm MV E velocity: 81.90 cm/s MV A velocity: 108.00 cm/s MV E/A ratio:  0.76  Vina Gull MD Electronically signed by Vina Gull MD Signature Date/Time: 05/08/2024/11:15:08 PM    Final   TEE  ECHO TEE 12/19/2018  Interpretation Summary  Aortic valve: No stenosis. Moderate regurgitation.  Mitral valve: Trace regurgitation.  Right ventricle: Normal cavity size, wall thickness and ejection fraction.  Tricuspid valve: Mild  regurgitation.    CT SCANS  CT CORONARY MORPH W/CTA COR W/SCORE 12/16/2018  Addendum 12/16/2018  3:31 PM ADDENDUM REPORT: 12/16/2018 15:29  CLINICAL DATA:  56F with hypertension, hyperlipidemia, GERD and chest pain.  EXAM: Cardiac/Coronary  CT  TECHNIQUE: The patient was scanned on a [employer].  FINDINGS: A 120 kV prospective scan was triggered in the descending thoracic aorta at 111 HU's. Axial non-contrast 3 mm slices were carried out through the heart. The data set was analyzed on a dedicated work station and scored using the Agatson method. Gantry rotation speed was 250 msecs and collimation was .6 mm. No beta blockade and 0.8 mg of sl NTG was given. The 3D data set was reconstructed in 5% intervals of the 67-82 % of the R-R cycle. Diastolic phases were analyzed on a dedicated work station using MPR, MIP and VRT modes. The patient received 80 cc of contrast.  Aorta:  Normal size.  calcifications.  No dissection.  Aortic Valve:  Trileaflet.  Mild calcification of the aortic root.  Coronary Arteries:  Normal coronary origin.  Right dominance.  RCA is a large dominant artery.  Diffuse calcification.  Left main is a large artery that gives rise to LAD and LCX arteries. There is minimal calcified plaque.  LAD is a large vessel.  There is diffuse calcification.  LCX is a non-dominant artery that is heavily calcified.  Other findings:  The degree of coronary stenosis is not interpretable due to mis-timing of the contrast bolus.  IMPRESSION: 1. Coronary calcium  score of 3220. This was 99th percentile for age and sex matched control.  2. Normal coronary origin with right dominance.  3. The degree of coronary stenosis is not interpretable due to mis-timing of the contrast bolus.  4. Three vessel CAD noted.  5. Given very high coronary calcium  score and ischemic symptoms, recommend cardiac catheterization.  6. Recommend aggressive risk factor  modification including high potency statin.  Annabella Scarce, MD   Electronically Signed By: Annabella Scarce On: 12/16/2018 15:29  Narrative EXAM: OVER-READ INTERPRETATION  CT CHEST  The following report is an over-read performed by radiologist Dr. Rockey Kilts of [facility] on 12/16/2018. This over-read does not include interpretation of cardiac or coronary anatomy or pathology. The coronary CTA interpretation by the cardiologist is attached.  COMPARISON:  CTA chest of 1 day prior.  FINDINGS: Vascular: Aortic atherosclerosis. Extremely early bolus timing, limiting evaluation of the aorta. Pulmonary arteries not opacified secondary to bolus timing.  Mediastinum/Nodes: No imaged thoracic adenopathy.  Lungs/Pleura: No pleural fluid.  Bibasilar scarring.  Upper Abdomen: Normal imaged portions of the liver, spleen, stomach.  Musculoskeletal: Moderate midthoracic spondylosis. Superior endplate compression deformity at T11 is mild.  IMPRESSION: 1.  No acute findings in the imaged extracardiac chest. 2.  Aortic Atherosclerosis (ICD10-I70.0).  Electronically Signed: By: Rockey Kilts M.D. On: 12/16/2018 13:42     ______________________________________________________________________________________________      Risk Assessment/Calculations           Physical Exam VS:  BP (!) 136/48   Pulse (!) 56   Ht 5' 3 (1.6 m)   Wt 148 lb 11.2 oz (67.4 kg)   SpO2 97%   BMI 26.34 kg/m        Wt Readings from Last 3 Encounters:  10/28/24 148 lb 11.2 oz (67.4 kg)  04/09/24 145 lb (65.8 kg)  04/10/23 137 lb (62.1 kg)    GEN: Well nourished, well developed in no acute distress NECK: No JVD; No carotid bruits CARDIAC: RRR, no murmurs, rubs, gallops RESPIRATORY:  Clear to auscultation without rales, wheezing or rhonchi  ABDOMEN: Soft, non-tender, non-distended EXTREMITIES:  1+ bilateral pitting pretibial edema; No deformity   ASSESSMENT AND  PLAN  Valvular heart disease - Echo 05/08/24 normal LVEF 65-70%, RVSF normal, RV mildly enlarged, 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 Reports dyspnea and LE edema increased from baseline. Bilateral pretibial 1+ edema with venous stasis color changes. Increase Lasix  from 20mg  to 40mg  daily. BMET in 1-2 weeks. If LE edema and dyspnea do not improve, could consider trial of SGLT2i. Discussed if dyspnea, edema persist could consider TEE for further evaluation. Will route to valve team APP for review as well.   CAD s/p CABG 2020 / HLD, LDL goal <70 - Stable with no anginal symptoms. No indication for ischemic evaluation.  GDMT includes aspirin , carvedilol , zetia , rosuvastatin  (previous transaminitis on atorvastatin ). .cwred    HTN - BP well controlled by home readings. Continue current antihypertensive regimen Carvedilol  25mg  BID, Lisinopril  20mg  BID. Discussed to monitor BP at home at least 2 hours after medications and sitting for 5-10 minutes.     Venous insufficiency - Venous stasis color changes noted on exam. Recommend leg elevation and compression stockings. Also suspect from fluid retention from severe TR, management as above.        Dispo: follow up in 3-4 months  Signed, Reche GORMAN Finder, NP   "

## 2024-10-28 NOTE — Patient Instructions (Addendum)
 Medication Instructions: CHANGE Furosemide  (Lasix ) to 40mg  daily We have sent a new prescription for the stronger tablet You could use up your 20mg  tablet by taking two of them together.   *If you need a refill on your cardiac medications before your next appointment, please call your pharmacy*  Lab Work: Your physician recommends that you return for lab work in 1-2 weeks for [facility]  Your provider has recommended lab work. Please have this collected at [facility]. The lab is open 8:00 am - 4:30 pm. Please avoid 12:00p - 1:00p for lunch hour. You do not need an appointment. Please go to [address] [place], [place] [address]. This is in the Primary Care office on the 3rd floor, let them know you are there for blood work and they will direct you to the lab.   If you have labs (blood work) drawn today and your tests are completely normal, you will receive your results only by: MyChart Message (if you have MyChart) OR A paper copy in the mail If you have any lab test that is abnormal or we need to change your treatment, we will call you to review the results.  Testing/Procedures: Your echocardiogram showed your heart muscle is pumping nice and strong. It showed mild to moderate leaking of mitral valve and moderate leaking of aortic valve which was stable from prior. Your tricuspid valve was leaking severely which is worse than prior. This can cause you to hold onto fluid which increases shortness of breath and edema. We are adjusting your Lasix  to help with the swelling and shortness of breath that can be coming from that heart valve.   Follow-Up: At [facility], you and your health needs are our priority.  As part of our continuing mission to provide you with exceptional heart care, our providers are all part of one team.  This team includes your primary Cardiologist (physician) and Advanced Practice Providers or APPs (Physician Assistants and Nurse  Practitioners) who all work together to provide you with the care you need, when you need it.  Your next appointment:   3-4 month(s)  Provider:   Annabella Scarce, MD or Reche Finder, NP    We recommend signing up for the patient portal called MyChart.  Sign up information is provided on this After Visit Summary.  MyChart is used to connect with patients for Virtual Visits (Telemedicine).  Patients are able to view lab/test results, encounter notes, upcoming appointments, etc.  Non-urgent messages can be sent to your provider as well.   To learn more about what you can do with MyChart, go to [email or web address].   Other Instructions  We will check in after your lab work on how your swelling and shortness of breath are doing. If they are not improved on the increased dose of Lasix  we may consider a medication called a SGLT2i [place]).   Dapagliflozin (Farxiga) Tablets What is this medication? DAPAGLIFLOZIN (DAP a gli FLOE zin) works by helping your kidneys remove salt (sodium) from your blood through the urine. This decreases the amount of work the kidneys and heart have to do.   This medicine may be used for other purposes; ask your health care provider or pharmacist if you have questions.  What should I watch for while using this medication? Visit your care team for regular checks on your progress. ________________________________  To prevent or reduce lower extremity swelling: Eat a low salt diet. Salt makes the body hold onto  extra fluid which causes swelling. Sit with legs elevated. For example, in the recliner or on an ottoman.  Wear knee-high compression stockings during the daytime. Ones labeled 15-20 mmHg provide good compression.

## 2024-11-11 ENCOUNTER — Ambulatory Visit (HOSPITAL_BASED_OUTPATIENT_CLINIC_OR_DEPARTMENT_OTHER): Payer: Self-pay | Admitting: Family

## 2024-11-11 DIAGNOSIS — Z79899 Other long term (current) drug therapy: Secondary | ICD-10-CM

## 2024-11-11 DIAGNOSIS — I071 Rheumatic tricuspid insufficiency: Secondary | ICD-10-CM

## 2024-11-11 LAB — BASIC METABOLIC PANEL WITH GFR
BUN/Creatinine Ratio: 31 — ABNORMAL HIGH (ref 12–28)
BUN: 29 mg/dL — ABNORMAL HIGH (ref 8–27)
CO2: 23 mmol/L (ref 20–29)
Calcium: 9.3 mg/dL (ref 8.7–10.3)
Chloride: 102 mmol/L (ref 96–106)
Creatinine, Ser: 0.93 mg/dL (ref 0.57–1.00)
Glucose: 117 mg/dL — ABNORMAL HIGH (ref 70–99)
Potassium: 4.7 mmol/L (ref 3.5–5.2)
Sodium: 138 mmol/L (ref 134–144)
eGFR: 61 mL/min/1.73

## 2024-11-11 MED ORDER — DAPAGLIFLOZIN PROPANEDIOL 10 MG PO TABS: 10.0000 mg | ORAL_TABLET | Freq: Every day | ORAL | 4 refills | Status: AC

## 2024-11-11 NOTE — Telephone Encounter (Signed)
 The patient has been notified of the result and verbalized understanding.  All questions (if any) were answered.  Pt states her swelling and dyspnea have somewhat improved, but not significantly.  Pt will proceed with starting Farxiga  10 mg po daily and come in for repeat BMET in 2 weeks after starting.   Confirmed the pharmacy of choice with the pt.   Pt is aware that I will go ahead and send a message to our PA Team to initiate a PA for this medication if needed.  She is aware they will message her back with the PA determination/cost.   Pt verbalized understanding and agrees with this plan.

## 2024-11-11 NOTE — Telephone Encounter (Signed)
-----   Message from Reche Finder, NP sent at 11/11/2024  4:38 PM EST ----- Overall stable kidney function.  Sodium level has normalized.  Normal potassium.  MyChart message sent  If LE edema and dyspnea improved, no changes. If LE edema and dyspnea persist, start Farxiga  10 mg daily with BMP in 2 weeks.

## 2024-11-12 ENCOUNTER — Telehealth: Payer: Self-pay | Admitting: Pharmacy Technician

## 2024-11-12 ENCOUNTER — Encounter (HOSPITAL_COMMUNITY): Payer: Self-pay | Admitting: Internal Medicine

## 2024-11-12 ENCOUNTER — Other Ambulatory Visit (HOSPITAL_COMMUNITY): Payer: Self-pay

## 2024-11-12 NOTE — Telephone Encounter (Signed)
 Pharmacy Patient Advocate Encounter   Received notification from Physician's Office - [facility] that prior authorization for farxiga  10 is required/requested.   Insurance verification completed.   The patient is insured through [employer].   Per test claim: Refill too soon. PA is not needed at this time. Medication was filled 11/11/24. Next eligible fill date is 12/04/24.

## 2024-12-03 LAB — BASIC METABOLIC PANEL WITH GFR
BUN/Creatinine Ratio: 35 — ABNORMAL HIGH (ref 12–28)
BUN: 35 mg/dL — ABNORMAL HIGH (ref 8–27)
CO2: 19 mmol/L — ABNORMAL LOW (ref 20–29)
Calcium: 9.1 mg/dL (ref 8.7–10.3)
Chloride: 99 mmol/L (ref 96–106)
Creatinine, Ser: 0.99 mg/dL (ref 0.57–1.00)
Glucose: 91 mg/dL (ref 70–99)
Potassium: 5.1 mmol/L (ref 3.5–5.2)
Sodium: 134 mmol/L (ref 134–144)
eGFR: 56 mL/min/{1.73_m2} — ABNORMAL LOW

## 2025-02-13 ENCOUNTER — Ambulatory Visit (HOSPITAL_BASED_OUTPATIENT_CLINIC_OR_DEPARTMENT_OTHER): Admitting: Family

## 2025-03-24 ENCOUNTER — Encounter (HOSPITAL_COMMUNITY)
